# Patient Record
Sex: Female | Born: 1980 | Race: White | Hispanic: No | Marital: Married | Smoking: Current every day smoker
Health system: Southern US, Community
[De-identification: ages and names within clinical notes are randomized; demographics above are authoritative.]

## PROBLEM LIST (undated history)

## (undated) VITALS — BP 140/101 | HR 99 | Temp 98.3°F | Resp 16 | Ht 64.0 in | Wt 223.0 lb

## (undated) DIAGNOSIS — E7800 Pure hypercholesterolemia, unspecified: Secondary | ICD-10-CM

## (undated) DIAGNOSIS — R112 Nausea with vomiting, unspecified: Secondary | ICD-10-CM

## (undated) DIAGNOSIS — R7303 Prediabetes: Secondary | ICD-10-CM

## (undated) DIAGNOSIS — R519 Headache, unspecified: Secondary | ICD-10-CM

## (undated) DIAGNOSIS — Z9889 Other specified postprocedural states: Secondary | ICD-10-CM

## (undated) DIAGNOSIS — K219 Gastro-esophageal reflux disease without esophagitis: Secondary | ICD-10-CM

## (undated) DIAGNOSIS — T85818A Embolism due to other internal prosthetic devices, implants and grafts, initial encounter: Secondary | ICD-10-CM

## (undated) DIAGNOSIS — Z87442 Personal history of urinary calculi: Secondary | ICD-10-CM

## (undated) DIAGNOSIS — Z95828 Presence of other vascular implants and grafts: Secondary | ICD-10-CM

## (undated) DIAGNOSIS — I10 Essential (primary) hypertension: Secondary | ICD-10-CM

## (undated) DIAGNOSIS — Z5189 Encounter for other specified aftercare: Secondary | ICD-10-CM

## (undated) DIAGNOSIS — F32A Depression, unspecified: Secondary | ICD-10-CM

## (undated) DIAGNOSIS — K911 Postgastric surgery syndromes: Secondary | ICD-10-CM

## (undated) DIAGNOSIS — F909 Attention-deficit hyperactivity disorder, unspecified type: Secondary | ICD-10-CM

## (undated) DIAGNOSIS — T7422XA Child sexual abuse, confirmed, initial encounter: Secondary | ICD-10-CM

## (undated) DIAGNOSIS — F419 Anxiety disorder, unspecified: Secondary | ICD-10-CM

## (undated) DIAGNOSIS — R319 Hematuria, unspecified: Secondary | ICD-10-CM

## (undated) DIAGNOSIS — D509 Iron deficiency anemia, unspecified: Secondary | ICD-10-CM

## (undated) DIAGNOSIS — K289 Gastrojejunal ulcer, unspecified as acute or chronic, without hemorrhage or perforation: Secondary | ICD-10-CM

## (undated) DIAGNOSIS — E282 Polycystic ovarian syndrome: Secondary | ICD-10-CM

## (undated) DIAGNOSIS — N289 Disorder of kidney and ureter, unspecified: Secondary | ICD-10-CM

## (undated) DIAGNOSIS — I82409 Acute embolism and thrombosis of unspecified deep veins of unspecified lower extremity: Secondary | ICD-10-CM

## (undated) DIAGNOSIS — T8859XA Other complications of anesthesia, initial encounter: Secondary | ICD-10-CM

## (undated) DIAGNOSIS — K760 Fatty (change of) liver, not elsewhere classified: Secondary | ICD-10-CM

## (undated) DIAGNOSIS — F329 Major depressive disorder, single episode, unspecified: Secondary | ICD-10-CM

## (undated) HISTORY — PX: KNEE SURGERY: SHX244

## (undated) HISTORY — PX: ADENOIDECTOMY: SUR15

## (undated) HISTORY — PX: CHOLECYSTECTOMY: SHX55

## (undated) HISTORY — PX: TONSILLECTOMY: SUR1361

## (undated) HISTORY — DX: Essential (primary) hypertension: I10

## (undated) HISTORY — DX: Child sexual abuse, confirmed, initial encounter: T74.22XA

## (undated) HISTORY — DX: Anxiety disorder, unspecified: F41.9

## (undated) HISTORY — DX: Polycystic ovarian syndrome: E28.2

## (undated) HISTORY — DX: Major depressive disorder, single episode, unspecified: F32.9

## (undated) HISTORY — DX: Gastro-esophageal reflux disease without esophagitis: K21.9

## (undated) HISTORY — DX: Fatty (change of) liver, not elsewhere classified: K76.0

## (undated) HISTORY — DX: Presence of other vascular implants and grafts: Z95.828

## (undated) HISTORY — DX: Other specified postprocedural states: Z98.890

## (undated) HISTORY — DX: Depression, unspecified: F32.A

## (undated) HISTORY — DX: Gastrojejunal ulcer, unspecified as acute or chronic, without hemorrhage or perforation: K28.9

## (undated) HISTORY — PX: ABDOMINAL SURGERY: SHX537

## (undated) HISTORY — DX: Pure hypercholesterolemia, unspecified: E78.00

## (undated) HISTORY — PX: TONSILECTOMY, ADENOIDECTOMY, BILATERAL MYRINGOTOMY AND TUBES: SHX2538

## (undated) HISTORY — PX: OTHER SURGICAL HISTORY: SHX169

## (undated) HISTORY — PX: ESOPHAGEAL DILATION: SHX303

---

## 2001-10-15 ENCOUNTER — Ambulatory Visit (HOSPITAL_COMMUNITY): Admission: RE | Admit: 2001-10-15 | Discharge: 2001-10-15 | Payer: Self-pay

## 2001-10-28 ENCOUNTER — Encounter: Payer: Self-pay | Admitting: Family Medicine

## 2001-10-28 ENCOUNTER — Ambulatory Visit (HOSPITAL_COMMUNITY): Admission: RE | Admit: 2001-10-28 | Discharge: 2001-10-28 | Payer: Self-pay | Admitting: Family Medicine

## 2001-11-18 ENCOUNTER — Encounter: Payer: Self-pay | Admitting: General Surgery

## 2001-11-18 ENCOUNTER — Observation Stay (HOSPITAL_COMMUNITY): Admission: RE | Admit: 2001-11-18 | Discharge: 2001-11-19 | Payer: Self-pay | Admitting: General Surgery

## 2001-11-18 ENCOUNTER — Encounter (INDEPENDENT_AMBULATORY_CARE_PROVIDER_SITE_OTHER): Payer: Self-pay

## 2004-04-04 ENCOUNTER — Encounter: Admission: RE | Admit: 2004-04-04 | Discharge: 2004-04-04 | Payer: Self-pay | Admitting: Orthopedic Surgery

## 2004-06-04 HISTORY — PX: GASTRIC BYPASS: SHX52

## 2004-07-11 ENCOUNTER — Ambulatory Visit: Payer: Self-pay | Admitting: Cardiology

## 2007-08-13 ENCOUNTER — Inpatient Hospital Stay (HOSPITAL_COMMUNITY): Admission: AD | Admit: 2007-08-13 | Discharge: 2007-08-16 | Payer: Self-pay | Admitting: Family Medicine

## 2007-08-15 ENCOUNTER — Ambulatory Visit: Payer: Self-pay | Admitting: Internal Medicine

## 2007-08-18 ENCOUNTER — Ambulatory Visit (HOSPITAL_COMMUNITY): Admission: RE | Admit: 2007-08-18 | Discharge: 2007-08-18 | Payer: Self-pay | Admitting: Family Medicine

## 2008-01-09 ENCOUNTER — Observation Stay (HOSPITAL_COMMUNITY): Admission: AD | Admit: 2008-01-09 | Discharge: 2008-01-12 | Payer: Self-pay | Admitting: Family Medicine

## 2008-03-24 ENCOUNTER — Ambulatory Visit: Payer: Self-pay | Admitting: *Deleted

## 2008-03-24 ENCOUNTER — Inpatient Hospital Stay (HOSPITAL_COMMUNITY): Admission: RE | Admit: 2008-03-24 | Discharge: 2008-03-26 | Payer: Self-pay | Admitting: *Deleted

## 2008-03-24 ENCOUNTER — Emergency Department (HOSPITAL_COMMUNITY): Admission: EM | Admit: 2008-03-24 | Discharge: 2008-03-24 | Payer: Self-pay | Admitting: Emergency Medicine

## 2008-04-08 ENCOUNTER — Ambulatory Visit (HOSPITAL_COMMUNITY): Payer: Self-pay | Admitting: Psychiatry

## 2008-04-12 ENCOUNTER — Ambulatory Visit (HOSPITAL_COMMUNITY): Payer: Self-pay | Admitting: Psychiatry

## 2008-04-19 ENCOUNTER — Ambulatory Visit (HOSPITAL_COMMUNITY): Payer: Self-pay | Admitting: Psychiatry

## 2008-04-28 ENCOUNTER — Ambulatory Visit (HOSPITAL_COMMUNITY): Payer: Self-pay | Admitting: Psychiatry

## 2008-05-12 ENCOUNTER — Ambulatory Visit (HOSPITAL_COMMUNITY): Payer: Self-pay | Admitting: Psychiatry

## 2008-05-31 ENCOUNTER — Ambulatory Visit (HOSPITAL_COMMUNITY): Payer: Self-pay | Admitting: Psychiatry

## 2008-06-14 ENCOUNTER — Ambulatory Visit (HOSPITAL_COMMUNITY): Payer: Self-pay | Admitting: Psychiatry

## 2008-07-01 ENCOUNTER — Ambulatory Visit (HOSPITAL_COMMUNITY): Payer: Self-pay | Admitting: Psychiatry

## 2008-07-15 ENCOUNTER — Ambulatory Visit (HOSPITAL_COMMUNITY): Payer: Self-pay | Admitting: Psychiatry

## 2008-08-04 ENCOUNTER — Ambulatory Visit (HOSPITAL_COMMUNITY): Payer: Self-pay | Admitting: Psychiatry

## 2008-09-09 ENCOUNTER — Ambulatory Visit (HOSPITAL_COMMUNITY): Payer: Self-pay | Admitting: Psychiatry

## 2008-12-02 ENCOUNTER — Ambulatory Visit (HOSPITAL_COMMUNITY): Payer: Self-pay | Admitting: Psychiatry

## 2009-02-08 ENCOUNTER — Ambulatory Visit (HOSPITAL_COMMUNITY): Payer: Self-pay | Admitting: Psychiatry

## 2009-03-09 ENCOUNTER — Observation Stay (HOSPITAL_COMMUNITY): Admission: EM | Admit: 2009-03-09 | Discharge: 2009-03-10 | Payer: Self-pay | Admitting: Emergency Medicine

## 2009-04-05 ENCOUNTER — Emergency Department (HOSPITAL_COMMUNITY): Admission: EM | Admit: 2009-04-05 | Discharge: 2009-04-05 | Payer: Self-pay | Admitting: Emergency Medicine

## 2009-05-10 ENCOUNTER — Ambulatory Visit (HOSPITAL_COMMUNITY): Payer: Self-pay | Admitting: Psychiatry

## 2009-08-04 ENCOUNTER — Ambulatory Visit (HOSPITAL_COMMUNITY): Payer: Self-pay | Admitting: Psychiatry

## 2009-09-13 ENCOUNTER — Ambulatory Visit (HOSPITAL_COMMUNITY): Payer: Self-pay | Admitting: Psychiatry

## 2009-10-06 ENCOUNTER — Ambulatory Visit (HOSPITAL_COMMUNITY): Payer: Self-pay | Admitting: Psychiatry

## 2009-12-27 ENCOUNTER — Ambulatory Visit (HOSPITAL_COMMUNITY): Payer: Self-pay | Admitting: Psychiatry

## 2010-03-23 ENCOUNTER — Ambulatory Visit (HOSPITAL_COMMUNITY): Payer: Self-pay | Admitting: Psychiatry

## 2010-06-22 ENCOUNTER — Ambulatory Visit (HOSPITAL_COMMUNITY)
Admission: RE | Admit: 2010-06-22 | Discharge: 2010-06-22 | Payer: Self-pay | Source: Home / Self Care | Attending: Psychiatry | Admitting: Psychiatry

## 2010-07-24 ENCOUNTER — Ambulatory Visit (INDEPENDENT_AMBULATORY_CARE_PROVIDER_SITE_OTHER): Payer: Self-pay | Admitting: Gastroenterology

## 2010-07-24 ENCOUNTER — Encounter: Payer: Self-pay | Admitting: Gastroenterology

## 2010-07-24 ENCOUNTER — Other Ambulatory Visit: Payer: Self-pay | Admitting: Internal Medicine

## 2010-07-24 ENCOUNTER — Encounter: Payer: Self-pay | Admitting: Internal Medicine

## 2010-07-24 DIAGNOSIS — K458 Other specified abdominal hernia without obstruction or gangrene: Secondary | ICD-10-CM

## 2010-07-24 DIAGNOSIS — R1033 Periumbilical pain: Secondary | ICD-10-CM

## 2010-07-24 DIAGNOSIS — R197 Diarrhea, unspecified: Secondary | ICD-10-CM

## 2010-07-24 DIAGNOSIS — K912 Postsurgical malabsorption, not elsewhere classified: Secondary | ICD-10-CM | POA: Insufficient documentation

## 2010-07-25 ENCOUNTER — Ambulatory Visit (HOSPITAL_COMMUNITY)
Admission: RE | Admit: 2010-07-25 | Discharge: 2010-07-25 | Disposition: A | Discharge status: Home / Self Care | Payer: BC Managed Care – PPO | Source: Ambulatory Visit | Attending: Internal Medicine | Admitting: Internal Medicine

## 2010-07-25 ENCOUNTER — Other Ambulatory Visit: Payer: Self-pay | Admitting: Internal Medicine

## 2010-07-25 ENCOUNTER — Encounter (HOSPITAL_COMMUNITY): Payer: Self-pay

## 2010-07-25 ENCOUNTER — Encounter: Payer: Self-pay | Admitting: Gastroenterology

## 2010-07-25 DIAGNOSIS — K458 Other specified abdominal hernia without obstruction or gangrene: Secondary | ICD-10-CM

## 2010-07-25 DIAGNOSIS — R109 Unspecified abdominal pain: Secondary | ICD-10-CM | POA: Insufficient documentation

## 2010-07-25 MED ORDER — IOHEXOL 300 MG/ML  SOLN: 100.0000 mL | Freq: Once | INTRAMUSCULAR | Status: AC | PRN

## 2010-07-27 ENCOUNTER — Encounter: Payer: Self-pay | Admitting: Internal Medicine

## 2010-07-31 ENCOUNTER — Telehealth (INDEPENDENT_AMBULATORY_CARE_PROVIDER_SITE_OTHER): Payer: Self-pay

## 2010-08-01 ENCOUNTER — Encounter (HOSPITAL_COMMUNITY): Payer: BC Managed Care – PPO

## 2010-08-01 ENCOUNTER — Other Ambulatory Visit: Payer: Self-pay | Admitting: Internal Medicine

## 2010-08-01 DIAGNOSIS — Z01812 Encounter for preprocedural laboratory examination: Secondary | ICD-10-CM | POA: Insufficient documentation

## 2010-08-01 LAB — BASIC METABOLIC PANEL
BUN: 9 mg/dL (ref 6–23)
CO2: 23 mEq/L (ref 19–32)
Chloride: 106 mEq/L (ref 96–112)
Glucose, Bld: 155 mg/dL — ABNORMAL HIGH (ref 70–99)
Potassium: 4.2 mEq/L (ref 3.5–5.1)
Sodium: 137 mEq/L (ref 135–145)

## 2010-08-01 LAB — HEMOGLOBIN AND HEMATOCRIT, BLOOD
HCT: 41.1 % (ref 36.0–46.0)
Hemoglobin: 13.1 g/dL (ref 12.0–15.0)

## 2010-08-01 LAB — HCG, QUANTITATIVE, PREGNANCY: hCG, Beta Chain, Quant, S: <2 m[IU]/mL (ref <5)

## 2010-08-01 NOTE — Letter (Signed)
Summary: TCS ORDER  TCS ORDER   Imported By: Rexene Alberts 07/27/2010 11:13:53  _____________________________________________________________________  External Attachment:    Type:   Image     Comment:   External Document

## 2010-08-01 NOTE — Letter (Signed)
Summary: TCS ORDER  TCS ORDER   Imported By: Rexene Alberts 07/24/2010 15:24:16  _____________________________________________________________________  External Attachment:    Type:   Image     Comment:   External Document

## 2010-08-01 NOTE — Letter (Signed)
Summary: RAD ORDER CT ABD/PEL  RAD ORDER CT ABD/PEL   Imported By: Rexene Alberts 07/24/2010 15:13:40  _____________________________________________________________________  External Attachment:    Type:   Image     Comment:   External Document

## 2010-08-01 NOTE — Assessment & Plan Note (Signed)
Summary: chronic dumping syndrome   Vital Signs:  Patient profile:   30 year old female Height:      64 inches Weight:      237 pounds BMI:     40.83 Temp:     98.3 degrees F oral Pulse rate:   104 / minute BP sitting:   120 / 82  (left arm)  Visit Type:  Initial Consult [facility]   History of Present Illness: [name] is a pleasant 29 year old Caucasian female who presents today with her husband, at the request of the [facility] secondary to chronic diarrhea. [name] underwent a lap gastric bypass in 2006 by Dr. Lily Peer at [facility]. Her pre-op wt was 246, and she lost a total of 90 lbs; however, she has unfortunately almost completely regained all of her weight. She reports chronic issues since the operation, to include multiple EGDs with dilatations secondary to anastomotic ulcers, as well as chronic abdominal pain, N/V, requiring multiple admissions inpatient. She denies any nausea/vomiting, dysphagia, or epigastric pain currently. She is taking the appropriate vitamins s/p bariatric surgery, with the exception of Vit B12. She was told she did not need this anymore. Most current labs available are listed below.  Her main concern is of chronic diarrhea up to 17X per day at its worst. She has noted paper hematochezia X 1 month, not large amount. +postprandial urgency. She reports constant gas and bloating, as well as periumbilical pain, "crampy" 5/10. She states this lower abdominal pain has been "for years", and it is worse with eating. She was told she had chronic dumping by Dr. Adriana Simas at [facility]. She has undergone multiple tests to include GES, multiple EGDs, Barium swallow. No CT.  She reports snacking all day long, with an affinity for sweets. She enjoys dove chocolates, occasional cupcakes and cookies. Reports waffle with  nutella for breakfast, hot pocket for lunch, baked meat, veggies, and starch for dinner. Occasional  fast food. Avoids fried foods. +carbonated beverages, usually 1 liter diet dr pepper daily.   Jan 2012: mildly elevated ALT at 39, A1c 5.8, albumin low normal Nov 2011: H/H 13/40, TSH nl, B12 high 1798 May 2011: Vit D 51, nl    Current Medications (verified): 1)  Multivitamin .... Qd 2)  Amoxicilin Po .... Three Times A Day 3)  Calcium + D .... Once Daily 4)  Nexium .... 40mg  Qd 5)  Symbyax .... 6/25 Mg Qd 6)  Iron Supplement .... Qd 7)  Trazadone .... 100mg  Once Daily Prn 8)  Phenergan .... 25mg  Qid As Needed 9)  Clonapine .... 0.5mg  Qd  Allergies (verified): 1)  ! Codeine 2)  ! Keflex  Past History:  Past Medical History: DM (prior to surgery) HTN Fatty liver GERD, resolved hypercholesterolemia Multiple EGDs (usually emergent secondary to anastomotic strictures), last done Oct 2010 hx anastomotic ulcers   Past Surgical History: Gastric bypass 2006: Dr. Lily Peer knee surgery X 3 tonsillectomy as child left breast surgery (benign) cholecystectomy  Family History: Mother:gastric bypass, DM, HTN, hypercholesterolemia Father:DM, HTN No FH of Colon Cancer:  Social History: Occupation: not currently employed No children Married X 4 years Patient has never smoked.  Alcohol Use - yes, rarely  Illicit Drug Use - no Smoking Status:  never Drug Use:  no  Review of Systems General:  Denies fever, chills, and anorexia. Eyes:  Denies blurring, irritation, and discharge. ENT:  Denies sore throat, hoarseness, and difficulty swallowing.  CV:  Denies chest pains and syncope. Resp:  Denies dyspnea at rest and wheezing. GI:  See HPI. GU:  Denies urinary burning and urinary frequency. MS:  Denies joint pain / LOM, joint swelling, and joint stiffness. Derm:  Denies rash, itching, and dry skin. Neuro:  Denies weakness and syncope. Psych:  Denies depression and anxiety. Endo:  Denies cold intolerance and heat intolerance. Heme:  Denies bruising and  bleeding.  Physical Exam  General:  healthy appearing and obese.   Head:  Normocephalic and atraumatic. Eyes:  PERRLA, no icterus. Lungs:  Clear throughout to auscultation. Heart:  Regular rate and rhythm; no murmurs, rubs,  or bruits. Abdomen:  +BS, obese, soft, mildly TTP periumbilically. No palpably masses or HSM. no rebound or guarding.  Msk:  Symmetrical with no gross deformities. Normal posture. Pulses:  Normal pulses noted. Extremities:  No clubbing, cyanosis, edema or deformities noted. Neurologic:  Alert and  oriented x4;  grossly normal neurologically. Skin:  Intact without significant lesions or rashes. Psych:  Alert and cooperative. Normal mood and affect.   Impression & Recommendations:  Problem # 1:  DIARRHEA (ICD-44.84)  30 year old Caucasian female with hx of lap gastric bypass in 2006 by Dr. Lily Peer. Reports chronic diarrhea for several months, has recently noticed +small amount of paper hematochezia intermitently. c/o +postprandial urgency. Reports at most 17 loose stools per day. Pt admits to enjoying sweets such as chocolate, snacking throughout day, drinking carbonated beverages. This most likely is dumping syndrome, with her noncompliance to the dietary regimen exacerbating the occurrence and frequency of stools. ? bile salt component,   s/p chole. ?IBS. Small amount of hematochezia may be r/t benign anorectal source, yet could benefit from colonoscopy as pt has not yet undergone one.  I had a lengthy discussion with pt concerning her diet; I feel a large part of her symptoms are r/t non-compliance with dietary regimen. However, we will proceed with a colonoscopy first to assess for any other etiology.  TCS with Dr. Jena Gauss in OR secondary to difficulty with prior sedation, polypharmacy: the R/B/A have been discussed in detail with pt; she states understanding and desires to proceed. Avoid sweets, breads, carbonated drinks Add probiotic daily, immodium as needed  (align samples given) Consider referral to nutritionist: this would be highly beneficial in this setting  Orders: Consultation Level III (16109)  Problem # 2:  ABDOMINAL PAIN-PERIUMBILICAL (ICD-789.05)  peri-umbilical pain that is chronic in nature. May be functional abdominal pain, but symptoms concerning for possible internal hernia due to location and symptomatology.  Will proceed with CT abd/pelvis Further rec's to follow  Orders: Consultation Level III (60454)  Problem # 3:  INTESTINAL MALABSORPTION, POSTSURGICAL (ICD-579.3) Hx of gastric bypass, taking post-operative supplements as required yet no B12. Needs updated nutritional parameters.   CBC, B12, Vit D  Obtain Op reports from 2006 Obtain any radiology reports (i.e. barium swallow, GES) Continue vitamins, resume B12  Orders: T-CBC w/Diff (09811-91478) T-Vitamin B12 [phone]) T-Vitamin D (25-Hydroxy) (57846-96295) Consultation Level III (28413)   Orders Added: 1)  T-CBC w/Diff [24401-02725] 2)  T-Vitamin B12 [82607-23330] 3)  T-Vitamin D (25-Hydroxy) [36644-03474] 4)  Consultation Level III [25956]

## 2010-08-03 ENCOUNTER — Encounter: Payer: Self-pay | Admitting: Internal Medicine

## 2010-08-03 ENCOUNTER — Other Ambulatory Visit: Payer: Self-pay | Admitting: Internal Medicine

## 2010-08-03 ENCOUNTER — Ambulatory Visit (HOSPITAL_COMMUNITY)
Admission: RE | Admit: 2010-08-03 | Discharge: 2010-08-03 | Disposition: A | Discharge status: Home / Self Care | Payer: BC Managed Care – PPO | Source: Ambulatory Visit | Attending: Internal Medicine | Admitting: Internal Medicine

## 2010-08-03 DIAGNOSIS — R197 Diarrhea, unspecified: Secondary | ICD-10-CM | POA: Insufficient documentation

## 2010-08-03 DIAGNOSIS — Z9889 Other specified postprocedural states: Secondary | ICD-10-CM

## 2010-08-03 DIAGNOSIS — Z01812 Encounter for preprocedural laboratory examination: Secondary | ICD-10-CM | POA: Insufficient documentation

## 2010-08-03 HISTORY — DX: Other specified postprocedural states: Z98.890

## 2010-08-04 LAB — OVA AND PARASITE EXAMINATION

## 2010-08-04 LAB — FECAL LACTOFERRIN, QUANT: Fecal Lactoferrin: POSITIVE

## 2010-08-07 LAB — STOOL CULTURE

## 2010-08-09 NOTE — Op Note (Addendum)
NAMESTEFANNIE, [name]             ACCOUNT NO.:  [id number]  MEDICAL RECORD NO.:  [id number]           PATIENT TYPE:  O  LOCATION:  [facility]                          FACILITY:  [facility]  PHYSICIAN:  R. Roetta Sessions, M.D. DATE OF BIRTH:  05-Jul-1980  DATE OF PROCEDURE:  08/03/2010 DATE OF DISCHARGE:                              OPERATIVE REPORT   INDICATIONS FOR PROCEDURE:  A 30 year old lady with chronic diarrhea. Recent CT demonstrated only a fatty liver.  Ileal colonoscopy is now being done to further evaluate chronic diarrhea.  Risks, benefits, limitations, alternatives, and imponderables have been discussed, questions answered.  Please see the documentation in the medical record.  PROCEDURE NOTE:  O2 saturation, blood pressure, pulse, and respirations were monitored throughout the entire procedure.  CONSCIOUS SEDATION:  Because of polypharmacy difficulties with sedation previously, this is being done under propofol.  The patient was placed in left lateral decubitus position.  INSTRUMENT:  Pentax video chip system.  FINDINGS:  Digital rectal exam revealed no abnormalities.  Endoscopic findings:  Prep was adequate.  Colon:  Colonic mucosa was surveyed from the rectosigmoid junction through the left transverse right colon to the appendiceal orifice, ileocecal valve/cecum.  These structures were well seen and photographed for the record.  From this level, the scope was slowly withdraw.  All previously mentioned mucosal surfaces were again seen.  The terminal ileum was intubated to 5 cm.  The patient's colon appeared entirely normal as did terminal ileum.  Biopsies of the ascending and sigmoid segments were taken to rule out microscopic colitis.  Also, fresh stool specimen was submitted to the lab.  Scope was pulled down into the rectum where thorough examination of the rectal mucosa including retroflexion of the anal verge demonstrated no abnormalities. The patient tolerated the  procedure well.  Cecal withdrawal time 11 minutes.  IMPRESSION:  Normal rectum, colon, and terminal ileum, status post segmental biopsy, stool sampling.  RECOMMENDATIONS:  We will follow up on pending studies.  We will then make further suggestions as to management.     Jonathon Bellows, M.D.     RMR/MEDQ  D:  08/03/2010  T:  08/03/2010  Job:  [id number]  cc:   [facility]  Electronically Signed by Lorrin Goodell M.D. on 08/08/2010 02:43:51 PM Electronically Signed by Lorrin Goodell M.D. on 08/08/2010 03:17:09 PM Electronically Signed by Lorrin Goodell M.D. on 08/08/2010 03:41:54 PM Electronically Signed by Lorrin Goodell M.D. on 08/08/2010 04:18:30 PM Electronically Signed by Lorrin Goodell M.D. on 08/08/2010 04:49:40 PM Electronically Signed by Lorrin Goodell M.D. on 08/08/2010 04:49:40 PM Electronically Signed by Lorrin Goodell M.D. on 08/08/2010 07:36:58 PM

## 2010-08-10 NOTE — Progress Notes (Signed)
----   Converted from flag ---- ---- 07/31/2010 9:05 AM, Gerrit Halls NP wrote: you can inform her the CT did not show evidence of internal hernia. No abnormalitites. We will continue with colonoscopy.   ---- 07/28/2010 3:04 PM, Hendricks Limes LPN wrote: pt called requesting results ------------------------------  Appended Document:  pt aware

## 2010-08-14 ENCOUNTER — Encounter (INDEPENDENT_AMBULATORY_CARE_PROVIDER_SITE_OTHER): Payer: Self-pay

## 2010-08-17 ENCOUNTER — Encounter (INDEPENDENT_AMBULATORY_CARE_PROVIDER_SITE_OTHER): Payer: Self-pay | Admitting: Psychiatry

## 2010-08-17 DIAGNOSIS — F331 Major depressive disorder, recurrent, moderate: Secondary | ICD-10-CM

## 2010-08-22 NOTE — Miscellaneous (Addendum)
Summary: stool studies  Clinical Lists Changes Culture, Stool(Salm/Shig/Campy&ECO157) - STATUS: Final  SEE NOTE.                                 Perform Date: 1 Mar12 10:35  Ordered By: Jena Gauss MD , Gerrit Friends           Ordered Date: 1 Mar12 10:35  Facility: [facility]                               Department: [facility]  Service Report Text     SPECIMEN OBTAINED:            08/03/2010 10:35  SPECIMEN DESCRIPTION:         STOOL                                ENDO SPECIMEN  SPECIAL REQUESTS:             NONE  CULTURE:                      NO SALMONELLA, SHIGELLA, CAMPYLOBACTER, OR                                YERSINIA ISOLATED                                Performed at [facility]  REPORT STATUS:                FINAL                                08/07/2010  Additional Information  HL7 RESULT STATUS : F  External IF Update Timestamp : 2010-08-07:08:12:00.000000   Ova and Parasite Exam - STATUS: Final  SEE NOTE.                                 Perform Date: 1 Mar12 10:35  Ordered By: Jena Gauss MD , Gerrit Friends           Ordered Date: 1 Mar12 10:35  Facility: [facility]                               Department: [facility]  Service Report Text     SPECIMEN OBTAINED:            08/03/2010 10:35  SPECIMEN DESCRIPTION:         STOOL                                ENDO SPECIMEM  SPECIAL REQUESTS:             NONE  OVA AND PARASITES:            NO OVA OR PARASITES SEEN  REPORT STATUS:                FINAL  08/04/2010  Additional Information  HL7 RESULT STATUS : F  External IF Update Timestamp : 2010-08-04:16:19:00.000000    Fecal-Lactoferrin - STATUS: Final  SEE NOTE.                                 Perform Date: 1 Mar12 10:35  Ordered By: Jena Gauss MD , Gerrit Friends           Ordered Date: 1 Mar12 10:35  Facility: [facility]                               Department: [facility]  Service Report Text     SPECIMEN OBTAINED:            08/03/2010 10:35  SPECIMEN DESCRIPTION:          STOOL                                ENDO SPECIMEN  SPECIAL REQUESTS:             NONE  FECAL LACTOFERRIN:            POSITIVE  REPORT STATUS:                FINAL                                08/04/2010  Additional Information  HL7 RESULT STATUS : F  External IF Update Timestamp : 2010-08-04:05:25:00.000000   L-Clostridium Difficile by PCR - STATUS: Final                                            Perform Date: 1 Mar12 10:35  Ordered ByJena Gauss MD , Gerrit Friends           Ordered Date: 1 Mar12 10:35                                       Last Updated Date: 1 Mar12 12:28  Facility: [facility]                               Department: [facility]  Accession #: [id number]                   USN:       [id number]  Findings  Result Name                              Result     Abnl   Normal Range     Units      Perf. Loc.  C Difficile by PCR                       NEGATIVE          NEG  Additional Information  HL7 RESULT STATUS : F  External IF Update Timestamp : 2010-08-03:12:26:00.000000  Appended Document: stool studies bx's negative for colitis; stool negative except  for lactoferrin. Recommend add Align to her regimen; OV w AS in next 3- 4 weeks  Appended Document: stool studies pt aware  Appended Document: stool studies pt aware of OV for 4/2 @ 3pm w/AS

## 2010-09-04 ENCOUNTER — Ambulatory Visit: Payer: Self-pay | Admitting: Gastroenterology

## 2010-09-06 LAB — PREGNANCY, URINE: Preg Test, Ur: NEGATIVE

## 2010-09-06 LAB — COMPREHENSIVE METABOLIC PANEL
BUN: 3 mg/dL — ABNORMAL LOW (ref 6–23)
Calcium: 9.5 mg/dL (ref 8.4–10.5)
GFR calc non Af Amer: >60 mL/min (ref >60)
Glucose, Bld: 124 mg/dL — ABNORMAL HIGH (ref 70–99)
Total Bilirubin: 0.5 mg/dL (ref 0.3–1.2)
Total Protein: 7.2 g/dL (ref 6.0–8.3)

## 2010-09-06 LAB — CBC
HCT: 44.1 % (ref 36.0–46.0)
Hemoglobin: 14.5 g/dL (ref 12.0–15.0)
MCHC: 33 g/dL (ref 30.0–36.0)
MCV: 84.9 fL (ref 78.0–100.0)
RDW: 16.2 % — ABNORMAL HIGH (ref 11.5–15.5)

## 2010-09-06 LAB — URINALYSIS, ROUTINE W REFLEX MICROSCOPIC
Glucose, UA: NEGATIVE mg/dL
Leukocytes, UA: NEGATIVE
Protein, ur: NEGATIVE mg/dL
Specific Gravity, Urine: <1.005 — ABNORMAL LOW (ref 1.005–1.030)
Urobilinogen, UA: 0.2 mg/dL (ref 0.0–1.0)
pH: 5.5 (ref 5.0–8.0)

## 2010-09-06 LAB — DIFFERENTIAL
Basophils Relative: 0 % (ref 0–1)
Eosinophils Relative: 0 % (ref 0–5)
Lymphs Abs: 1.7 10*3/uL (ref 0.7–4.0)
Monocytes Relative: 5 % (ref 3–12)
Neutro Abs: 5.7 10*3/uL (ref 1.7–7.7)
Neutrophils Relative %: 73 % (ref 43–77)

## 2010-09-06 LAB — PREALBUMIN: Prealbumin: 25 mg/dL (ref 18.0–45.0)

## 2010-09-06 LAB — URINE MICROSCOPIC-ADD ON

## 2010-09-06 LAB — LIPASE, BLOOD: Lipase: 31 U/L (ref 11–59)

## 2010-09-07 LAB — DIFFERENTIAL
Basophils Absolute: 0 10*3/uL (ref 0.0–0.1)
Basophils Relative: 0 % (ref 0–1)
Basophils Relative: 0 % (ref 0–1)
Eosinophils Absolute: 0 10*3/uL (ref 0.0–0.7)
Eosinophils Absolute: 0 10*3/uL (ref 0.0–0.7)
Eosinophils Relative: 0 % (ref 0–5)
Eosinophils Relative: 0 % (ref 0–5)
Lymphocytes Relative: 14 % (ref 12–46)
Lymphocytes Relative: 32 % (ref 12–46)
Lymphs Abs: 3.2 10*3/uL (ref 0.7–4.0)
Monocytes Relative: 5 % (ref 3–12)

## 2010-09-07 LAB — COMPREHENSIVE METABOLIC PANEL
ALT: 14 U/L (ref 0–35)
ALT: 19 U/L (ref 0–35)
AST: 19 U/L (ref 0–37)
AST: 24 U/L (ref 0–37)
Albumin: 3.5 g/dL (ref 3.5–5.2)
Alkaline Phosphatase: 92 U/L (ref 39–117)
BUN: 6 mg/dL (ref 6–23)
CO2: 24 mEq/L (ref 19–32)
CO2: 24 mEq/L (ref 19–32)
Calcium: 8.7 mg/dL (ref 8.4–10.5)
Chloride: 101 mEq/L (ref 96–112)
Creatinine, Ser: 0.75 mg/dL (ref 0.4–1.2)
GFR calc Af Amer: >60 mL/min (ref >60)
GFR calc Af Amer: >60 mL/min (ref >60)
GFR calc non Af Amer: >60 mL/min (ref >60)
GFR calc non Af Amer: >60 mL/min (ref >60)
Potassium: 2.8 mEq/L — ABNORMAL LOW (ref 3.5–5.1)
Sodium: 137 mEq/L (ref 135–145)
Sodium: 138 mEq/L (ref 135–145)
Total Bilirubin: 0.6 mg/dL (ref 0.3–1.2)
Total Protein: 6.1 g/dL (ref 6.0–8.3)
Total Protein: 8.2 g/dL (ref 6.0–8.3)

## 2010-09-07 LAB — CBC
HCT: 44.8 % (ref 36.0–46.0)
Hemoglobin: 12.9 g/dL (ref 12.0–15.0)
MCHC: 33.4 g/dL (ref 30.0–36.0)
MCV: 83.8 fL (ref 78.0–100.0)
Platelets: 230 10*3/uL (ref 150–400)
Platelets: 255 10*3/uL (ref 150–400)
RBC: 4.56 MIL/uL (ref 3.87–5.11)
RBC: 5.34 MIL/uL — ABNORMAL HIGH (ref 3.87–5.11)
RDW: 15.2 % (ref 11.5–15.5)
RDW: 15.3 % (ref 11.5–15.5)
WBC: 14.6 10*3/uL — ABNORMAL HIGH (ref 4.0–10.5)

## 2010-09-07 LAB — URINALYSIS, ROUTINE W REFLEX MICROSCOPIC
Hgb urine dipstick: NEGATIVE
Ketones, ur: 15 mg/dL — AB
Nitrite: NEGATIVE
Protein, ur: NEGATIVE mg/dL
Specific Gravity, Urine: >1.03 — ABNORMAL HIGH (ref 1.005–1.030)
Urobilinogen, UA: 0.2 mg/dL (ref 0.0–1.0)
pH: 5.5 (ref 5.0–8.0)

## 2010-09-07 LAB — LIPASE, BLOOD: Lipase: 19 U/L (ref 11–59)

## 2010-09-25 ENCOUNTER — Telehealth: Payer: Self-pay

## 2010-09-25 NOTE — Telephone Encounter (Signed)
Pt came by office and said her prescription for Protonix has expired that [facility] gave her and she is no longer with [facility] because her husband got a job. She is taking Nexium  40mg  qhs,  Protonix 40mg  qhs, and Ranitidine qhs. She was scheduled for OV on 10/16/2010. Darl Pikes was able to change and give her an appt tomorrow to review meds and prescriptions.

## 2010-09-26 ENCOUNTER — Ambulatory Visit (INDEPENDENT_AMBULATORY_CARE_PROVIDER_SITE_OTHER): Payer: BC Managed Care – PPO | Admitting: Gastroenterology

## 2010-09-26 ENCOUNTER — Encounter: Payer: Self-pay | Admitting: Gastroenterology

## 2010-09-26 VITALS — BP 131/89 | HR 77 | Temp 98.2°F | Ht 64.0 in | Wt 234.4 lb

## 2010-09-26 DIAGNOSIS — R197 Diarrhea, unspecified: Secondary | ICD-10-CM

## 2010-09-26 MED ORDER — DICYCLOMINE HCL 10 MG PO CAPS: 10.0000 mg | ORAL_CAPSULE | Freq: Four times a day (QID) | ORAL | Status: AC

## 2010-09-26 NOTE — Assessment & Plan Note (Signed)
30 year old female with hx of gastric bypass in 2006. Colonoscopy performed March 2012 normal, stool studies all completed and without etiology. Likely dumping syndrome, food choices. At last visit admitted to sweets, high carb diet. Discussed high protein, low carb, high fiber diet. Would highly benefit from nutrition referral for in-depth management. Will trial bentyl. Keep food diary, return in 1 week. F/u in 3 mos.

## 2010-09-26 NOTE — Progress Notes (Signed)
Referring Provider: Harlow Asa, MD Primary Care Physician:  Harlow Asa, MD Gastroenterologist: Dr. Jena Gauss  Chief Complaint  Patient presents with  . Follow-up    about medication and colonscopy    HPI:   [name] returns today in f/u after colonoscopy that was performed secondary to chronic diarrhea. Hx of gastric bypass in 2006. States diarrhea has overall slightly improved. Only complaint is of gas, sometimes doubles her over. Taking a probiotic, not tried gas-x yet. Eats a lot of yogurt and seems to do well with this without diarrhea. Postprandial stools with salad. Better about limiting snacking. Occasional diarrhea with fresh fruit. No melena or brbpr. Down a few lbs from last visit.    Past Medical History  Diagnosis Date  . DM (diabetes mellitus)   . HTN (hypertension)   . Fatty liver   . GERD (gastroesophageal reflux disease)   . Hypercholesterolemia   . Anastomotic ulcer     multiple  . S/P endoscopy     last done Oct 2010, multiple, usually emergent due to anastomotic strictures  . S/P colonoscopy 08/2010    normal.    Past Surgical History  Procedure Date  . Gastric bypass 2006    Dr. Lily Peer  . Knee surgery     x3  . Tonsilectomy, adenoidectomy, bilateral myringotomy and tubes     as a child  . Left breast surgery     benign  . Cholecystectomy     Current Outpatient Prescriptions  Medication Sig Dispense Refill  . BESIVANCE 0.6 % SUSP       . Calcium Carbonate-Vitamin D (CALCIUM + D PO) Take 600 mg by mouth daily.        . clonazePAM (KLONOPIN) 0.5 MG tablet Take 0.5 mg by mouth daily.        Marland Kitchen esomeprazole (NEXIUM) 40 MG packet Take 40 mg by mouth daily before breakfast.        . Ferrous Sulfate (IRON SUPPLEMENT PO) Take by mouth.        . LOTEMAX 0.5 % ophthalmic suspension       . Multiple Vitamin (MULTIVITAMIN PO) Take by mouth daily.        Marland Kitchen olanzapine-FLUoxetine (SYMBYAX) 6-25 MG per capsule Take 1 capsule by mouth every evening.        .  promethazine (PHENERGAN) 25 MG tablet Take 25 mg by mouth every 6 (six) hours as needed.        . traZODone (DESYREL) 100 MG tablet Take 100 mg by mouth daily as needed.          Allergies as of 09/26/2010 - Review Complete 09/26/2010  Allergen Reaction Noted  . Cephalexin  07/24/2010  . Codeine  07/24/2010  . Keflex  07/25/2010  . Skin comfort (alum sulfate-ca acetate)  07/25/2010    Family History  Problem Relation Age of Onset  . Diabetes Mother   . Hypertension Mother   . Hyperlipidemia Mother   . Hypertension Father   . Diabetes Father     History   Social History  . Marital Status: Married    Spouse Name: N/A    Number of Children: N/A  . Years of Education: N/A   Social History Main Topics  . Smoking status: Never Smoker   . Smokeless tobacco: None  . Alcohol Use: No     occassional social drinker  . Drug Use: No     Review of Systems: Gen: Denies fever, chills, anorexia. Denies fatigue,  weakness, weight loss.  CV: Denies chest pain, palpitations, syncope, peripheral edema, and claudication. Resp: Denies dyspnea at rest, cough, wheezing, coughing up blood, and pleurisy. GI: Denies vomiting blood, jaundice, and fecal incontinence.   Denies dysphagia or odynophagia. Derm: Denies rash, itching, dry skin Psych: Denies depression, anxiety, memory loss, confusion. No homicidal or suicidal ideation.  Heme: Denies bruising, bleeding, and enlarged lymph nodes.  Physical Exam: BP 131/89  Pulse 77  Temp(Src) 98.2 F (36.8 C) (Tympanic)  Ht 5\' 4"  (1.626 m)  Wt 234 lb 6.4 oz (106.323 kg)  BMI 40.23 kg/m2 General:   Alert and oriented. No distress noted. Pleasant and cooperative.  Head:  Normocephalic and atraumatic. Eyes:  Conjuctiva clear without scleral icterus. Mouth:  Oral mucosa pink and moist. Good dentition. No lesions. Neck:  Supple, without mass or thyromegaly. Heart:  S1, S2 present without murmurs, rubs, or gallops. Regular rate and rhythm. Abdomen:   +BS, soft, non-tender and non-distended. No rebound or guarding. No HSM or masses noted. Msk:  Symmetrical without gross deformities. Normal posture. Extremities:  Without edema. Neurologic:  Alert and  oriented x4;  grossly normal neurologically. Skin:  Intact without significant lesions or rashes. Psych:  Alert and cooperative. Normal mood and affect.

## 2010-09-26 NOTE — Patient Instructions (Signed)
High fiber diet.   Continue Nexium, but take it in the morning 30 minutes before breakfast daily. Stop Zantac.  Nutrition appointment for assistance with management.  Keep a food diary for 1 week, then return to the office so I can review this. Write down when you have bowel changes or increased gas.  Continue probiotic daily. May take gas-x or beano as needed for gas.  Trial of Bentyl. Contact our office in 2-3 weeks if this does not seem to help.  We will see you back in 3 months.

## 2010-09-26 NOTE — Progress Notes (Signed)
Cc to PCP 

## 2010-10-02 ENCOUNTER — Other Ambulatory Visit: Payer: Self-pay | Admitting: Obstetrics & Gynecology

## 2010-10-02 ENCOUNTER — Other Ambulatory Visit (HOSPITAL_COMMUNITY)
Admission: RE | Admit: 2010-10-02 | Discharge: 2010-10-02 | Disposition: A | Discharge status: Home / Self Care | Payer: BC Managed Care – PPO | Source: Ambulatory Visit | Attending: Obstetrics & Gynecology | Admitting: Obstetrics & Gynecology

## 2010-10-02 DIAGNOSIS — Z01419 Encounter for gynecological examination (general) (routine) without abnormal findings: Secondary | ICD-10-CM | POA: Insufficient documentation

## 2010-10-02 NOTE — Telephone Encounter (Signed)
noted 

## 2010-10-12 ENCOUNTER — Encounter (INDEPENDENT_AMBULATORY_CARE_PROVIDER_SITE_OTHER): Payer: BC Managed Care – PPO | Admitting: Psychiatry

## 2010-10-12 DIAGNOSIS — F39 Unspecified mood [affective] disorder: Secondary | ICD-10-CM

## 2010-10-16 ENCOUNTER — Encounter: Payer: Self-pay | Admitting: Internal Medicine

## 2010-10-16 ENCOUNTER — Ambulatory Visit: Payer: Self-pay | Admitting: Gastroenterology

## 2010-10-17 NOTE — H&P (Signed)
[name], [name]             ACCOUNT NO.:  [id number]   MEDICAL RECORD NO.:  [id number]         PATIENT TYPE:  PINP   LOCATION:  A320                          FACILITY:  [facility]   PHYSICIAN:  Donna Bernard, M.D.DATE OF BIRTH:  1980/12/17   DATE OF ADMISSION:  DATE OF DISCHARGE:  LH                              HISTORY & PHYSICAL   CHIEF COMPLAINT:  Vomiting, dizzy when standing, and cannot keep  anything down.   SUBJECTIVE:  This patient is a 30 year old white female with a history  of asthma, remote diabetes diet controlled only, depression, anxiety,  who presents to the office for the second time in 3 days with acute  concerns.  The patient notes a 5-day duration of nausea and vomiting.  She claims she cannot keep anything down, even frequent sips of clear  liquids.  The patient has had possibly a low-grade fever.  She has had  no diarrhea.  On occasion, she had abdominal discomfort, mildly crampy  in nature.  The patient was seen 3 days prior in the office.  At that  time, she had had severe nausea and vomiting for 2 days and was feeling  weak.  We did orthostatic blood pressures at that time, which were  within normal limits and encouraged her to try to maintain outpatient  therapy.  The patient's Compazine suppositories and a Phenergan  injection added to her usual regimen of Phenergan at home.  The patient  now states over the last 3 days that she has thrown up everything day  and night.  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  She has a history of depression and anxiety.  She has been  on four different antidepressants now and the past couple of years.  This patient admits a bit of new stress with her new classes students,  but she does not feel that her illness is stress-related.  Of note, she  had a similar bout earlier this year, which led to two trips to  emergency room and had a several day hospitalization here.  She left  here in  a couple of days, later went down to the [facility] and was  admitted for a week.  In the end, the final diagnosis was just a  protracted response to vomiting and diarrhea from gastroenteritis.  I  felt, however, the patient's history of prior gastric bypass along with  her depression and anxiety were a significant component at that time.  The patient notes no dysuria, no increased frequency, no headache, no  chest pain, and decent appetite.   She claims compliance with her current medications, these include:  1. Abilify 5 mg q.a.m.  2. Trazodone 100 mg p.o. at bedtime.  3. Klonopin 0.5 mg p.o. at bedtime.  4. Celexa 20 mg p.o. q.a.m.  5. Of note, the patient has been on prior antidepressants including      Effexor, Wellbutrin, and Lexapro.   PRIOR SURGERIES:  1. Remote tonsillectomy.  2. Herniorrhaphy.  3. Remote knee surgery.  4. Remote cholecystectomy.  5. Gastric bypass in 2006,  with subsequent significant weight loss      since then on the order of 75-80 pounds.   ALLERGIES:  No true allergies but sensitive to [facility] including family  history positive for hypertension, and diabetes.   SOCIAL:  The patient is married.  No children.  She is a Engineer, site.  No tobacco or alcohol use.   REVIEW OF SYSTEMS:  Otherwise negative.   PHYSICAL EXAM:  Blood pressures 100/70 with pulse of 90 lying down,  90/70 with a pulse 110 while standing.  The patient became lightheaded  and dizzy and requires assistance to sit back down.  HEENT:  Mucous membranes somewhat dry.  NECK: Supple.  LUNGS:  Clear.  HEART:  Regular rate and rhythm.  ABDOMEN:  Hyperactive bowel sounds.  Diffuse upper abdominal tenderness  but rather mild in nature.  No rebound, no guarding, and no CVA  tenderness.  EXTREMITIES:  Normal.  GU:  No dysuria is noted.   SIGNIFICANT LABS:  CBC; white blood count normal and hemoglobin normal.  MET-7, potassium borderline low at 3.2.   IMPRESSION:  1. Vomiting, volume  contraction, weight loss of 7 pounds in the last 3      days, orthostatic changes, clinically dehydrated, blood work not      that impressive also complicated.  2. History of gastric bypass.  3. Depression anxiety.  4. Asthma.  5. Type 2 diabetes, controlled.   PLAN:  Admit for fluids, IV Zofran further orders as noted in the chart.  Hopefully, this will not require protracted visit.      Donna Bernard, M.D.  Electronically Signed     WSL/MEDQ  D:  01/10/2008  T:  01/10/2008  Job:  [id number]
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
## 2010-10-17 NOTE — Group Therapy Note (Signed)
[name], [name]             ACCOUNT NO.:  [id number]   MEDICAL RECORD NO.:  [id number]          PATIENT TYPE:  OBV   LOCATION:  A320                          FACILITY:  [facility]   PHYSICIAN:  Scott A. Gerda Diss, MD    DATE OF BIRTH:  Oct 08, 1980   DATE OF PROCEDURE:  01/12/2008  DATE OF DISCHARGE:                                 PROGRESS NOTE   Overall, the patient states she feels better, minimal nausea, and no  vomiting at this point. No fevers.  The abdomen is a little bit sore  with no severe pain.  She denies any vomiting, had little bit of  diarrhea yesterday.   PHYSICAL EXAMINATION:  GENERAL:  She seems pleasant.  LUNGS:  Clear.  HEART:  Regular.  ABDOMEN:  Soft.  She has minimal epigastric tenderness.  She states she  was taking liquids well.   PLAN:  Increase her diet with a bland food.  If she tolerates this well,  go to a soft diet at lunch, and if she tolerates this well, then we will  be able to potentially discharge her to home later today.  The patient  overall should do well, and if she does go home today, we will have her  follow up in the office next week.      Scott A. Gerda Diss, MD  Electronically Signed     SAL/MEDQ  D:  01/12/2008  T:  01/12/2008  Job:  [id number]

## 2010-10-17 NOTE — Discharge Summary (Signed)
[name], [name]             ACCOUNT NO.:  [id number]   MEDICAL RECORD NO.:  [id number]          PATIENT TYPE:  INP   LOCATION:  A301                          FACILITY:  [facility]   PHYSICIAN:  Donna Bernard, M.D.DATE OF BIRTH:  01/23/1981   DATE OF ADMISSION:  08/12/2007  DATE OF DISCHARGE:  03/14/2009LH                               DISCHARGE SUMMARY   FINAL DIAGNOSES:  1. Gastroc enteritis.  2. Migraine headache.  3. History of gastric bypass with history of previous obstruction.  4. Anxiety and depression.   FINAL DISPOSITION:  1. Patient discharged to home.  2. Patient to use clear liquid plus low-fat soft diet.  3. Have upper GI with small-bowel follow-through test as scheduled.  4. Maintain all home medications.  5. Add Phenergan 25 q.4-6h. p.r.n. for nausea either oral or per      rectum.   INITIAL HISTORY AND PHYSICAL:  Please see H&P as dictated.   HOSPITAL COURSE:  This patient is a 30 year old white female with prior  history of type 2 diabetes, asthma and remote gastric bypass in 2006  with subsequent obstruction who presented the day of admission with  history of multiple trips to the emergency room in the previous days.  The patient had vomiting, dry heaves, abdominal pain.  CBC showed 12,000  white blood count.  Potassium was low at 2.8.  The patient was having  tremendous amount of vomiting and diarrhea.  This was felt to represent  gastritis, it was also felt to be a failure on outpatient therapy.  The  patient was admitted to the hospital for IV hydration.  She was given  Zofran IV for nausea.  We did give Rocephin pending stool and urine  culture.  Over the next couple of days the patient continued to have  significant amount of nausea and also developed a headache.  GI folks  were consulted.  They recommended doing a CT scan and pelvis.  This was  done without any noticeable diagnoses emerging.  Stool cultures and  urine cultures returned normal.  On  the day of discharge, the patient  was feeling somewhat better.  She had not vomited in the past 24 hours.  Her abdominal exam was benign.  GI folks felt that she needed to have a  follow-up upper GI with small-bowel follow-through.  This was scheduled.  Of note, her potassium had also resolved.  The patient was discharged  home with diagnosis and disposition as noted above.      Donna Bernard, M.D.  Electronically Signed    WSL/MEDQ  D:  09/05/2007  T:  09/05/2007  Job:  [id number]

## 2010-10-17 NOTE — Group Therapy Note (Signed)
NAMEAARYANA, BETKE             ACCOUNT NO.:  [id number]   MEDICAL RECORD NO.:  [id number]          PATIENT TYPE:  INP   LOCATION:  A301                          FACILITY:  [facility]   PHYSICIAN:  Scott A. Gerda Diss, MD    DATE OF BIRTH:  11-30-1980   DATE OF PROCEDURE:  08/14/2007  DATE OF DISCHARGE:                                 PROGRESS NOTE   The patient is able to take some liquids. Might try little bit more. Had  some diarrhea last night, some nausea, no vomiting this morning and just  woke up though.  She is on ceftriaxone.  She states she feels like her  kidneys are hurting in her back.   PHYSICAL EXAMINATION:  LUNGS:  Clear.  HEART:  Regular.  ABDOMEN:  Mild tenderness to percussion on the right side does. Not  appear in acute distress.   We will advance diet.  Recheck urine. Continue the ceftriaxone and  possibly home later today but for the most part right now to stay where  she is at.      Scott A. Gerda Diss, MD  Electronically Signed     SAL/MEDQ  D:  08/14/2007  T:  08/15/2007  Job:  [id number]

## 2010-10-17 NOTE — Consult Note (Signed)
[name], [name]             ACCOUNT NO.:  [id number]   MEDICAL RECORD NO.:  [id number]          PATIENT TYPE:  INP   LOCATION:  A301                          FACILITY:  [facility]   PHYSICIAN:  R. Roetta Sessions, M.D. DATE OF BIRTH:  08-02-1980   DATE OF CONSULTATION:  08/15/2007  DATE OF DISCHARGE:                                 CONSULTATION   REASON FOR CONSULTATION:  Nausea, vomiting, diarrhea, abdominal pain.   HISTORY OF PRESENT ILLNESS:  The patient is a 30 year old Caucasian  female who was admitted a couple of days ago with protracted nausea and  vomiting, diarrhea, and abdominal pain.  Symptoms all began about two  weeks ago.  She started headache and sinus drainage.  She states she  went to urgent care on Friday, and was given Cefzil.  On Saturday  morning she woke up with nausea and eventually vomiting and diarrhea.  Symptoms lasted over the weekend.  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  However, after a couple of days she states the symptoms hit me hard.  For over a week she has had persistent nausea and vomiting, dry heaves,  epigastric pain, and diarrhea.  She states she vomited all day  yesterday.  She only had two watery stools yesterday which were  nonbloody.  Her rotavirus came back negative.  Her stool culture is  pending.  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  She  required approximately seven EGD's with dilation of the stricture.  She  states she had a PIC line and was on TPN for over four or five months.  Her last EGD was about a year ago.  She states they thought she had an  ulcer, but this was negative.  With these symptoms she has actually been  to the ED in [place], and states she was given IV fluids on two  different occasions.   MEDICATIONS AT HOME:  1. Abilify 5 mg daily.  2. Trazodone 100 mg q.h.s.  3. Klonopin 0.5 mg q.h.s. p.r.n.  4. Celexa 20 mg daily.  5. Vitamins daily.  6. Calcium daily.  7. B12 daily.  8. Lexapro 20 mg daily.   ALLERGIES:  KEFLEX, CEPHALOSPORINS listed as causing nausea, but she is  not aware of this.  CODEINE causes nausea.   PAST MEDICAL HISTORY:  Type 2 diabetes mellitus.  Asthma all improved  since weight loss from gastric bypass.  She has PCOS.  Gastric bypass in  2006.  She lost over 80 pounds.  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  TPN for over  four months as outlined above.  She reports her last EGD to be about a  year ago was unremarkable except for postoperative changes.  She also  has a history of frequent UTI's.  She states she has been hospitalized  for pyelonephritis  back in 2007.  She denies having any workup for  frequent UTI's.  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  She had right knee surgery twice for cartilage repair.  She  had benign breast biopsy, left inguinal hernia repair, tonsillectomy.  She has depression and anxiety.  Has been on anti-depression medications  since age 47.  She states she was hospitalized last year when she was in  [place] for mental issues and was given a diagnosis, but she cannot  remember the name.   FAMILY HISTORY:  Positive for hypertension, diabetes.  Negative for  colorectal cancer, IBD, peptic ulcer disease, liver disease.   SOCIAL HISTORY:  She is married.  She has no children.  She is an early  developmental childhood Runner, broadcasting/film/video.  Denies any tobacco or alcoholic drug  use.   REVIEW OF SYSTEMS:  See HPI for GI.  CONSTITUTION:  She has lost 14  pounds in the last two weeks.  CARDIOPULMONARY:  No chest pain or  shortness of breath.  GENITOURINARY:  Frequent UTI's.   PHYSICAL EXAMINATION:  VITAL SIGNS:  T-max 99.5, T current 98.3, pulse  82,  respirations 16, blood pressure 124/81.  GENERAL:  Pleasant mildly obese Caucasian female in no acute distress.  SKIN:  Warm and dry, no jaundice.  HEENT:  Sclerae nonicteric, oropharyngeal mucosa moist and pink.  No  lymphadenopathy.  CHEST:  Clear to auscultation.  CARDIAC:  Reveals regular rate and rhythm, normal S1, S2.  No murmurs,  rubs or gallops.  ABDOMEN:  Positive bowel sounds, soft, nondistended.  She has mild  epigastric tenderness to deep palpation.  No rebound or guarding.  No  organomegaly or masses.  EXTREMITIES:  Lower extremities:  No edema.   LABORATORY DATA:  White count 12,000.  It was 12,300 on admission.  Hemoglobin 13.6, platelets 286,000.  Total bilirubin 0.6, alkaline  phosphatase 55.  AST 15, ALT 14.  Albumin 3.2.  Sodium 137, potassium 4,  BUN 3, creatinine 0.71, glucose 97.  Amylase 48.   IMPRESSION:  The patient is a 30 year old lady with persistent nausea,  vomiting, diarrhea, and epigastric tenderness.  Diarrhea seems to be  improving with only two watery stools yesterday.  She had persistent  vomiting all day yesterday.  Thus far her rotavirus was negative.  Stool cultures pending as well as urine culture.  She continues to have  mild leukocytosis.  She is being treated with Rocephin.  Predominant  symptom at this point is persistent nausea, vomiting, epigastric pain.  This may very well be a protracted gastroenteritis type picture.  I do  agree, however, with Dr. Lubertha South that we need to rule out upper  gastrointestinal obstruction given her prior history of anastomotic  stricture requiring multiple dilations status post gastric bypass  surgery.   RECOMMENDATIONS:  1. Continue supportive measures.  2. Continue PPI.  3. Will follow up stool and urine cultures.  4. Follow up upper GI series as available.  5. Further recommendations to follow.   I would like to thank Dr. Lubertha South for allowing [place] to take part in  the care of this  patient.      Tana Coast, P.AJonathon Bellows, M.D.  Electronically Signed    LL/MEDQ  D:  08/15/2007  T:  08/15/2007  Job:  [id number]   cc:   Donna Bernard, M.D.  Fax: [phone]

## 2010-10-17 NOTE — H&P (Signed)
[name], [name]             ACCOUNT NO.:  [id number]   MEDICAL RECORD NO.:  [id number]          PATIENT TYPE:  OBV   LOCATION:  A301                          FACILITY:  [facility]   PHYSICIAN:  Donna Bernard, M.D.DATE OF BIRTH:  12-27-1980   DATE OF ADMISSION:  08/12/2007  DATE OF DISCHARGE:  LH                              HISTORY & PHYSICAL   CHIEF COMPLAINT:  Vomiting, dry heaves, abdominal pain, multiple trips  to the emergency room.   SUBJECTIVE:  This patient is a 30 year old white female with a prior  history of type 2 diabetes, PCOS, and asthma, who presents to the office  the day of admission with ongoing acute concerns.  On the latter part of  last week the patient developed nausea which proceeded to vomiting.  This was also accompanied by diarrhea.  The patient had a low-grade  fever.  Of note, several days prior she had been seen in Urgent Care  with headache, cough, congestion and frontal headache.  She was given  Cefzil.  She was unable to take this, though, because soon her nausea  and vomiting developed.  Through the weekend she actually had two  separate visits to the ER with IV fluids and workup.  Blood work then  showed mild elevation in white blood count.  The patient has continued  to have nausea and vomiting.  She has had six runny stools so far today.  She is lightheaded when she stands up.  She has vomited fiber six times  today.  She now states she is having dry heaves.  The patient is  accompanied by her mother.  The patient and family are very frustrated.  She cannot seem to shake these symptoms and, if anything, it seems to be  getting worse.  The patient also notes fairly severe epigastric pain,  crampy in nature, minimal radiation.   PRIOR MEDICAL HISTORY:  Significant for type 2 diabetes, PCOS and  asthma.  All have been improved after a gastric bypass.   PAST SURGERIES:  1. Remote tonsillectomy.  2. Remote herniorrhaphy.  3. Remote breast  biopsy.  4. Remote knee surgery.  5. Remote cholecystectomy.  6. 2007, hospitalization for pyelonephritis.  7. 2006, gastric bypass.   FAMILY HISTORY:  Positive for hypertension and diabetes.   ALLERGIES:  KEFLEX causes nausea, CODEINE, nausea.   SOCIAL HISTORY:  The patient is married.  No children.  She is an early  developmental childhood Runner, broadcasting/film/video.  No tobacco or alcohol use.   The patient claims compliance with her chronic medications, which  include:  1. Abilify 5 mg daily.  2. Trazodone 100 mg nightly.  3. Klonopin 0.5 mg nightly. p.r.n.  4. Celexa 20 mg daily.  5. Vitamin supplement and calcium daily.   REVIEW OF SYSTEMS:  Otherwise negative.   BP 140/95 both lying and sitting up, pulse 120.  The patient is alert, moderate malaise, actively vomiting.  HEENT:  Mucous membranes somewhat dry.  TMs normal.  NECK:  Supple.  LUNGS:  Clear.  HEART:  Regular rate and rhythm.  No tachycardia.  ABDOMEN:  Significant  epigastric tenderness.  No rebound, no guarding.  Good bowel sounds, hyperactive bowel sounds actually.  EXTREMITIES:  Normal.  SKIN:  Normal.   SIGNIFICANT LABS:  Urine pregnancy test in the office negative.  CBC:  12,500 white blood count with 14 hemoglobin.  MET-7:  Low potassium of  2.8, BUN 5, creatinine 0.7, glucose 108.  Urine pregnancy test negative.   IMPRESSION:  Severe gastroenteritis, likely Rotavirus.  The patient has  had failure on outpatient therapy.  She has actually had two separate  trips in the last 3 days to the emergency room with IV fluids and both  times and had recurrence her symptoms soon after leaving the hospital.   PLAN:  Observation and IV fluids, Zofran, culture stool, check  Rotavirus, maintain mental health medications.  Further orders as noted  on the chart.      Donna Bernard, M.D.  Electronically Signed     WSL/MEDQ  D:  08/12/2007  T:  08/13/2007  Job:  [id number]

## 2010-10-20 NOTE — Op Note (Signed)
[facility]  Patient:    [name], [name] Visit Number: [id number] MRN: [id number]          Service Type: SUR Location: [id number] Attending Physician:  [name] Dictated by:   Anselm Pancoast. Zachery Dakins, M.D. Proc. Date: 11/18/01 Admit Date:  11/18/2001 Discharge Date: 11/19/2001   CC:         Dr. Selinda Flavin, [facility] in [place]   Operative Report  PREOPERATIVE DIAGNOSIS:  Biliary dyskinesia.  POSTOPERATIVE DIAGNOSIS:  Biliary dyskinesia.  OPERATION:  Laparoscopic cholecystectomy with cholangiogram.  SURGEON:  Anselm Pancoast. Zachery Dakins, M.D.  ASSISTANT:  Nurse.  HISTORY:  The patient is a 30 year old Caucasian female who was referred to our office by Dr. Selinda Flavin for management of symptomatic gallbladder.  The patient is slightly overweight and has been recently diagnosed as a mild diabetic on oral medications, and has been having episodes of epigastric pain which first was evaluated at [facility] with an ultrasound and no gallstones were demonstrated, and she has had similar episodes of pain, and these were repeated, and then a hepatobiliary scan stimulated was performed over in [place].  The results showed only about a 15% ejection fraction.  The patient however said that she did not receive the half-and-half as the study stated, and I talked to Dr. Anselmo Pickler who said that there were three that they actually had done was CCK stimulated that were just typed wrong, and on checking this, she definitely had been given the CCK, and I have discussed this with the patient.  She said that they probably had given her something in the IV, and she desired to proceed on with a laparoscopic cholecystectomy.  Her laboratory studies, CBC, and liver function studies were normal preoperatively.  DESCRIPTION OF PROCEDURE:  The patient was given 3 g of Unasyn, PAS stockings, and she weighs about 200 pounds, and was  positioned on the OR table with induction of general anesthesia.  The abdomen was prepped with Betadine surgical solution and draped in a sterile manner.  A small incision was made in the umbilicus and the fascia identified.  This was picked up between two Kochers and then a small opening made through the fascia done with the Tresa Endo which was used to carefully enter into the peritoneal cavity.  A pursestring suture of 0 Vicryl was placed and the Hasson cannula introduced.  The gallbladder was very intense and sort of intrahepatic distally, not acutely inflamed, and no evidence of any adhesions were noted.  I saw no other definite abnormalities and the upper 10 mm trocar was placed in the subxiphoid area, and the two lateral 5 mm trocars were placed, and I anesthetized the fascia with Marcaine.  The gallbladder was grasped upward and outward, and I very carefully opened the peritoneum proximally and the cystic artery was separated from the cystic duct, first doubly clipped proximally and singly distally divided, and then the cystic duct was encompassed.  I placed a clip flush with the neck of the junction of the cystic duct gallbladder.  I then made a small opening proximally and the [facility] catheter was introduced.  There was no gravel or little stones in the cystic duct.  The catheter was held in place with a clip and then an x-ray obtained.  She had a very small extrahepatic biliary system with good flow into the duodenum, and no evidence of any abnormalities that I could see.  I then removed the catheter and triply  clipped the short cystic duct and divided it, and then identified the posterior artery which was doubly clipped proximally singly and distally divided.  I then removed the gallbladder from its bed using the hook electrocautery.  The bile was spilled and good hemostasis was obtained, and then after the area was completely free, we switched the camera to the upper 10 mm port and  withdrew the gallbladder through the umbilical fascial defect.  Later, I then inspected the gallbladder and opened it, and there were no stones or cholesterolosis that I could identify.  I then closed the abdomen and proceeded to close the umbilical fascia with a second figure-of-eight suture of 0 Vicryl plus the pursestring that I had previously placed.  I anesthetized this with Marcaine and then removed the little irrigating fluid that had been used.  There was good hemostasis and then removed the two lateral 5 mm ports. The carbon dioxide was released and the upper trocar was withdrawn.  The subcutaneous layers were closed with 4-0 Vicryl, and Benzoin and Steri-Strips for the skin.  The patient tolerated the procedure nicely and was sent to the recovery room in stable postoperative condition breathing spontaneously. Dictated by:   Anselm Pancoast. Zachery Dakins, M.D. Attending Physician:  [name] DD:  11/18/01 TD:  11/19/01 Job: [id number]

## 2010-11-09 ENCOUNTER — Encounter (INDEPENDENT_AMBULATORY_CARE_PROVIDER_SITE_OTHER): Payer: BC Managed Care – PPO | Admitting: Psychiatry

## 2010-11-09 DIAGNOSIS — F331 Major depressive disorder, recurrent, moderate: Secondary | ICD-10-CM

## 2010-12-13 ENCOUNTER — Emergency Department (HOSPITAL_COMMUNITY): Payer: BC Managed Care – PPO

## 2010-12-13 ENCOUNTER — Other Ambulatory Visit: Payer: Self-pay

## 2010-12-13 ENCOUNTER — Emergency Department (HOSPITAL_COMMUNITY)
Admission: EM | Admit: 2010-12-13 | Discharge: 2010-12-13 | Disposition: A | Discharge status: Home / Self Care | Payer: BC Managed Care – PPO | Attending: Emergency Medicine | Admitting: Emergency Medicine

## 2010-12-13 ENCOUNTER — Encounter (HOSPITAL_COMMUNITY): Payer: Self-pay

## 2010-12-13 DIAGNOSIS — I10 Essential (primary) hypertension: Secondary | ICD-10-CM | POA: Insufficient documentation

## 2010-12-13 DIAGNOSIS — R079 Chest pain, unspecified: Secondary | ICD-10-CM | POA: Diagnosis present

## 2010-12-13 DIAGNOSIS — E119 Type 2 diabetes mellitus without complications: Secondary | ICD-10-CM | POA: Insufficient documentation

## 2010-12-13 HISTORY — DX: Postgastric surgery syndromes: K91.1

## 2010-12-13 HISTORY — DX: Embolism due to other internal prosthetic devices, implants and grafts, initial encounter: T85.818A

## 2010-12-13 LAB — POCT I-STAT, CHEM 8
BUN: 7 mg/dL (ref 6–23)
Calcium, Ion: 1.06 mmol/L — ABNORMAL LOW (ref 1.12–1.32)
Chloride: 107 mEq/L (ref 96–112)
Creatinine, Ser: 0.5 mg/dL (ref 0.50–1.10)
HCT: 40 % (ref 36.0–46.0)
TCO2: 21 mmol/L (ref 0–100)

## 2010-12-13 MED ORDER — IOHEXOL 350 MG/ML SOLN
100.0000 mL | Freq: Once | INTRAVENOUS | Status: AC | PRN
  Administered 2010-12-13: 100 mL via INTRAVENOUS

## 2010-12-13 MED ORDER — SODIUM CHLORIDE 0.9 % IV BOLUS (SEPSIS)
1000.0000 mL | Freq: Once | INTRAVENOUS | Status: AC
  Administered 2010-12-13: 1000 mL via INTRAVENOUS

## 2010-12-13 NOTE — ED Notes (Signed)
Pt's oxygen level stayed at 100% while walking. Pt ambulated around the nursing desk with no difficultly.

## 2010-12-13 NOTE — ED Notes (Signed)
Returned from xray

## 2010-12-13 NOTE — ED Notes (Signed)
Pt reports was sitting on couch Saturday night and notices chest hurting.  Says her father just recently had a heart attack.

## 2010-12-13 NOTE — ED Notes (Signed)
MD at bedside. 

## 2010-12-13 NOTE — ED Provider Notes (Addendum)
History     Chief Complaint  Patient presents with  . Chest Pain    Chest pain x 4 days   Patient is a 30 y.o. female presenting with chest pain. The history is provided by the patient. No language interpreter was used.  Chest Pain Episode onset: 4 days ago. Chest pain occurs constantly. The chest pain is unchanged. Associated with: nothing. The severity of the pain is moderate. The quality of the pain is described as pressure-like. Radiates to: From right-side chest to left-side chest. Exacerbated by: nothing. Primary symptoms include nausea. Pertinent negatives for primary symptoms include no fever, no shortness of breath, no palpitations, no abdominal pain and no vomiting. She tried nothing for the symptoms. Risk factors include obesity (diabetes, hypertension, hyperlipidemia).  Her past medical history is significant for diabetes, hyperlipidemia and hypertension. Past medical history comments: Gastric bypass   C/o chest pain onset 4 days ago while resting with associated nausea. Notes that chest pain initially resolved for some time the first night after onset and returned the next day and has been persistent since. Describes pain as a pressure to right chest radiating to left chest. Pain is not aggravated or relieved by anything. Denies previous history of similar symptoms, fevers, diaphoresis, vomiting, diarrhea, recent extensive travel, bubbling/burning in throat and pain with active rotation of torso. Denies history of heart trouble. H/o blood clot related to PICC Line several yrs ago.  Patient seen at 11:18AM Past Medical History  Diagnosis Date  . DM (diabetes mellitus)   . HTN (hypertension)   . Fatty liver   . GERD (gastroesophageal reflux disease)   . Hypercholesterolemia   . Anastomotic ulcer     multiple  . S/P endoscopy     last done Oct 2010, multiple, usually emergent due to anastomotic strictures  . S/P colonoscopy 08/2010    normal.  . Dumping syndrome     Past  Surgical History  Procedure Date  . Gastric bypass 2006    Dr. Lily Peer  . Knee surgery     x3  . Tonsilectomy, adenoidectomy, bilateral myringotomy and tubes     as a child  . Left breast surgery     benign  . Cholecystectomy     Family History  Problem Relation Age of Onset  . Diabetes Mother   . Hypertension Mother   . Hyperlipidemia Mother   . Hypertension Father   . Diabetes Father     History  Substance Use Topics  . Smoking status: Never Smoker   . Smokeless tobacco: Not on file  . Alcohol Use: No     occassional social drinker    OB History    Grav Para Term Preterm Abortions TAB SAB Ect Mult Living                  Review of Systems  Constitutional: Negative for fever.  Respiratory: Negative for shortness of breath.   Cardiovascular: Positive for chest pain. Negative for palpitations and leg swelling.       Negative diaphoresis  Gastrointestinal: Positive for nausea. Negative for vomiting, abdominal pain and diarrhea.  All other systems reviewed and are negative.    Physical Exam  BP 130/86  Pulse 72  Temp(Src) 98.8 F (37.1 C) (Oral)  Resp 18  Ht 5\' 4"  (1.626 m)  Wt 230 lb (104.327 kg)  BMI 39.48 kg/m2  SpO2 97%  Physical Exam CONSTITUTIONAL: Well developed/well nourished HEAD AND FACE: Normocephalic/atraumatic EYES: EOMI/PERRL ENMT: Mucous membranes  moist NECK: supple no meningeal signs CV: S1/S2 noted, no murmurs/rubs/gallops noted LUNGS: Lungs are clear to auscultation bilaterally, no apparent distress ABDOMEN: soft, nontender, no rebound or guarding, bowel sounds normal NEURO: Pt is awake/alert, moves all extremitiesx4 EXTREMITIES: pulses normal-PT and DP, full ROM, no edema, bilateral calves non-tender SKIN: warm, color normal PSYCH: no abnormalities of mood noted  ED Course  Procedures  MDM Nursing notes reviewed and considered in documentation Previous records reviewed and considered xrays reviewed and considered ED ECG  REPORT   Date: 12/13/2010   Rate: 71  Rhythm: normal sinus rhythm  QRS Axis: normal  Intervals: normal  ST/T Wave abnormalities: normal  Conduction Disutrbances:none     Pt now reporting pain with deep breathing, reports recent start of contraceptives.  Distant h/o DVT due to indwelling PICC Will send d-dimer Ct chest negative Stable for d/c, suspicion for ACS is low Given IV hydration here     Joya Gaskins, MD 12/13/10 1441  Joya Gaskins, MD 01/08/11 [id number]

## 2010-12-13 NOTE — ED Notes (Signed)
Pt arrives to ed with complaint of mid-sternal chest pain that is radiating to left chest area, admits to nausea, denies any sob, diaphoresis, pain started 4 days ago, has gotten worse since yesterday, pt describes pain as a sharp pressure at times, skin warm and dry, nsr on monitor, ekg done on arrival to ed tx room

## 2010-12-29 ENCOUNTER — Ambulatory Visit: Payer: BC Managed Care – PPO | Admitting: Internal Medicine

## 2011-01-04 ENCOUNTER — Encounter (HOSPITAL_COMMUNITY): Payer: BC Managed Care – PPO | Admitting: Psychiatry

## 2011-01-12 ENCOUNTER — Ambulatory Visit: Payer: BC Managed Care – PPO | Admitting: Internal Medicine

## 2011-01-16 ENCOUNTER — Encounter (INDEPENDENT_AMBULATORY_CARE_PROVIDER_SITE_OTHER): Payer: BC Managed Care – PPO | Admitting: Psychiatry

## 2011-01-16 DIAGNOSIS — F331 Major depressive disorder, recurrent, moderate: Secondary | ICD-10-CM

## 2011-02-26 LAB — HEPATIC FUNCTION PANEL
ALT: 14
AST: 15
Albumin: 3.2 — ABNORMAL LOW
Bilirubin, Direct: 0.2
Indirect Bilirubin: 0.4
Total Bilirubin: 0.6

## 2011-02-26 LAB — DIFFERENTIAL
Basophils Absolute: 0.1
Basophils Relative: 0
Eosinophils Absolute: 0
Eosinophils Relative: 0
Eosinophils Relative: 0
Lymphocytes Relative: 17
Lymphocytes Relative: 21
Lymphs Abs: 2.1
Monocytes Absolute: 0.6
Monocytes Relative: 5
Neutro Abs: 8.9 — ABNORMAL HIGH
Neutrophils Relative %: 74
Neutrophils Relative %: 77

## 2011-02-26 LAB — BASIC METABOLIC PANEL
BUN: 3 — ABNORMAL LOW
BUN: 5 — ABNORMAL LOW
CO2: 26
CO2: 30
Calcium: 8 — ABNORMAL LOW
Calcium: 8.7
Chloride: 105
Creatinine, Ser: 0.65
Creatinine, Ser: 0.71
Creatinine, Ser: 0.73
GFR calc Af Amer: >60
GFR calc Af Amer: >60
GFR calc Af Amer: >60
GFR calc non Af Amer: >60
GFR calc non Af Amer: >60
Glucose, Bld: 108 — ABNORMAL HIGH
Glucose, Bld: 90
Glucose, Bld: 97
Potassium: 2.8 — ABNORMAL LOW
Sodium: 135

## 2011-02-26 LAB — URINALYSIS, ROUTINE W REFLEX MICROSCOPIC
Bilirubin Urine: NEGATIVE
Bilirubin Urine: NEGATIVE
Glucose, UA: NEGATIVE
Hgb urine dipstick: NEGATIVE
Ketones, ur: NEGATIVE
Protein, ur: NEGATIVE
Protein, ur: NEGATIVE
Specific Gravity, Urine: <1.005 — ABNORMAL LOW
Urobilinogen, UA: 0.2
Urobilinogen, UA: 0.2
pH: 6

## 2011-02-26 LAB — CBC
HCT: 39
HCT: 41.3
Hemoglobin: 13.6
Hemoglobin: 14.3
MCHC: 34.7
MCV: 86.9
Platelets: 286
Platelets: 300
RBC: 4.49
RDW: 12.8
RDW: 12.9
WBC: 12.3 — ABNORMAL HIGH

## 2011-02-26 LAB — STOOL CULTURE

## 2011-02-26 LAB — URINE MICROSCOPIC-ADD ON

## 2011-02-26 LAB — ROTAVIRUS ANTIGEN, STOOL: Rotavirus: NEGATIVE

## 2011-03-02 LAB — BASIC METABOLIC PANEL
BUN: 4 — ABNORMAL LOW
BUN: 4 — ABNORMAL LOW
BUN: 5 — ABNORMAL LOW
BUN: 8
CO2: 23
CO2: 24
CO2: 24
CO2: 26
Calcium: 8.6
Calcium: 8.8
Calcium: 9.1
Calcium: 9.2
Chloride: 101
Chloride: 102
Chloride: 109
Creatinine, Ser: 0.73
Creatinine, Ser: 0.73
Creatinine, Ser: 0.75
Creatinine, Ser: 0.76
GFR calc Af Amer: >60
GFR calc Af Amer: >60
GFR calc Af Amer: >60
GFR calc non Af Amer: >60
GFR calc non Af Amer: >60
GFR calc non Af Amer: >60
GFR calc non Af Amer: >60
Glucose, Bld: 106 — ABNORMAL HIGH
Glucose, Bld: 89
Glucose, Bld: 89
Glucose, Bld: 94
Potassium: 3.4 — ABNORMAL LOW
Potassium: 3.7
Potassium: 4.1
Potassium: 4.2
Sodium: 134 — ABNORMAL LOW
Sodium: 137
Sodium: 138

## 2011-03-02 LAB — DIFFERENTIAL
Basophils Absolute: 0
Basophils Absolute: 0
Basophils Relative: 0
Basophils Relative: 1
Eosinophils Absolute: 0
Eosinophils Absolute: 0
Eosinophils Relative: 0
Eosinophils Relative: 1
Lymphocytes Relative: 18
Lymphocytes Relative: 42
Lymphs Abs: 1.8
Lymphs Abs: 3.4
Monocytes Absolute: 0.3
Monocytes Absolute: 0.6
Monocytes Relative: 4
Monocytes Relative: 7
Neutro Abs: 4.1
Neutro Abs: 7.7
Neutrophils Relative %: 50
Neutrophils Relative %: 78 — ABNORMAL HIGH

## 2011-03-02 LAB — HEPATIC FUNCTION PANEL
ALT: 14
AST: 18
Albumin: 4
Alkaline Phosphatase: 77
Bilirubin, Direct: 0.2
Indirect Bilirubin: 0.9
Total Bilirubin: 1.1
Total Protein: 7.2

## 2011-03-02 LAB — URINALYSIS, ROUTINE W REFLEX MICROSCOPIC
Glucose, UA: NEGATIVE
Protein, ur: NEGATIVE
pH: 7.5

## 2011-03-02 LAB — CBC
HCT: 39.1
MCHC: 33.5
Platelets: 214
Platelets: 256
RDW: 13.5
WBC: 8.1

## 2011-03-02 LAB — URINE MICROSCOPIC-ADD ON

## 2011-03-05 LAB — URINE MICROSCOPIC-ADD ON

## 2011-03-05 LAB — POCT I-STAT, CHEM 8
BUN: 6
Creatinine, Ser: 0.8
HCT: 50 — ABNORMAL HIGH
Hemoglobin: 17 — ABNORMAL HIGH
Potassium: 3.1 — ABNORMAL LOW
Sodium: 135
TCO2: 22

## 2011-03-05 LAB — DIFFERENTIAL
Eosinophils Relative: 0
Lymphocytes Relative: 24
Lymphs Abs: 2.1
Neutro Abs: 5.7

## 2011-03-05 LAB — RAPID URINE DRUG SCREEN, HOSP PERFORMED
Benzodiazepines: POSITIVE — AB
Tetrahydrocannabinol: NOT DETECTED

## 2011-03-05 LAB — URINALYSIS, ROUTINE W REFLEX MICROSCOPIC
Nitrite: NEGATIVE
Specific Gravity, Urine: 1.024
pH: 6

## 2011-03-05 LAB — CBC
HCT: 49 — ABNORMAL HIGH
Platelets: 228
RBC: 5.55 — ABNORMAL HIGH
RDW: 13.2
WBC: 8.6

## 2011-03-05 LAB — PROTIME-INR
INR: 6.3
Prothrombin Time: 62.2 — ABNORMAL HIGH

## 2011-03-05 LAB — APTT: aPTT: 50 — ABNORMAL HIGH

## 2011-03-05 LAB — ETHANOL: Alcohol, Ethyl (B): <5

## 2011-03-06 LAB — PROTIME-INR
INR: 1
INR: 1.2
Prothrombin Time: 15.1

## 2011-03-06 LAB — APTT: aPTT: 25

## 2011-03-15 ENCOUNTER — Encounter (INDEPENDENT_AMBULATORY_CARE_PROVIDER_SITE_OTHER): Payer: BC Managed Care – PPO | Admitting: Psychiatry

## 2011-03-15 ENCOUNTER — Encounter (HOSPITAL_COMMUNITY): Payer: BC Managed Care – PPO | Admitting: Psychiatry

## 2011-03-15 DIAGNOSIS — F331 Major depressive disorder, recurrent, moderate: Secondary | ICD-10-CM

## 2011-03-21 DIAGNOSIS — K319 Disease of stomach and duodenum, unspecified: Secondary | ICD-10-CM | POA: Insufficient documentation

## 2011-05-10 ENCOUNTER — Encounter (HOSPITAL_COMMUNITY): Payer: BC Managed Care – PPO | Admitting: Psychiatry

## 2011-05-10 ENCOUNTER — Ambulatory Visit (INDEPENDENT_AMBULATORY_CARE_PROVIDER_SITE_OTHER): Payer: BC Managed Care – PPO | Admitting: Psychiatry

## 2011-05-10 ENCOUNTER — Encounter (HOSPITAL_COMMUNITY): Payer: Self-pay | Admitting: Psychiatry

## 2011-05-10 VITALS — Wt 220.0 lb

## 2011-05-10 DIAGNOSIS — F331 Major depressive disorder, recurrent, moderate: Secondary | ICD-10-CM

## 2011-05-10 MED ORDER — ZOLPIDEM TARTRATE ER 12.5 MG PO TBCR: 12.5000 mg | EXTENDED_RELEASE_TABLET | Freq: Every evening | ORAL | Status: DC | PRN

## 2011-05-10 NOTE — Progress Notes (Signed)
Patient came for her followup appointment. She was admitted in October and November at [facility] for severe nausea or vomiting and headache. She was diagnosed with brain cyst and now she is scheduled to see a neurosurgeon on 17th of this month. Patient is somewhat anxious and nervous but she was told it would be a small procedure. Overall she has been stable on her Symbax and Ambien. She is sleeping better. She denies any agitation anger or mood swings. She denies any crying spells. She is scheduled to have court hearing for her disability on January 22. She reported no side effects of medication.  Mental status examination Patient is casually dressed and well groomed. She appears anxious and nervous and her affect is constricted. She denies any active or passive suicidal thoughts or homicidal thoughts. There no psychotic symptoms present. Her attention and concentration is fair. Her speech is slow but clear and coherent. She denies any auditory or visual hallucination. She's alert and oriented x3. Her insight judgment and pulse control is okay.  Assessment Maj. depressive disorder  Plan I will continue her Ambien CR 12.5 mg at bedtime. She will continue her Symbax which she usually get from pharmaceutical company. I have explained risks and benefits of medication in detail. I recommended to call if she start feeling worsening of her depression. I will see her again in 2 months

## 2011-05-18 ENCOUNTER — Other Ambulatory Visit (HOSPITAL_COMMUNITY): Payer: Self-pay | Admitting: Psychiatry

## 2011-05-21 DIAGNOSIS — E349 Endocrine disorder, unspecified: Secondary | ICD-10-CM | POA: Insufficient documentation

## 2011-07-10 ENCOUNTER — Encounter (HOSPITAL_COMMUNITY): Payer: Self-pay | Admitting: Psychiatry

## 2011-07-10 ENCOUNTER — Ambulatory Visit (INDEPENDENT_AMBULATORY_CARE_PROVIDER_SITE_OTHER): Payer: BC Managed Care – PPO | Admitting: Psychiatry

## 2011-07-10 VITALS — Wt 218.0 lb

## 2011-07-10 DIAGNOSIS — F329 Major depressive disorder, single episode, unspecified: Secondary | ICD-10-CM

## 2011-07-10 MED ORDER — ZOLPIDEM TARTRATE ER 12.5 MG PO TBCR: 12.5000 mg | EXTENDED_RELEASE_TABLET | Freq: Every evening | ORAL | Status: DC | PRN

## 2011-07-10 NOTE — Progress Notes (Signed)
Patient came for her followup appointment. She is a good Christmas. She has been stable on her medication. She denies any agitation anger or mood swings. Her sleep is good with Ambien. Overall she has been stable on her Symbax and Ambien. She is sleeping better. She denies any agitation anger or mood swings. She denies any crying spells. She continues to have some issues with social isolation however her energy and concentration is improved.  Mental status examination Patient is casually dressed and well groomed. She appears anxious and her affect is constricted. She denies any active or passive suicidal thoughts or homicidal thoughts. There no psychotic symptoms present. Her attention and concentration is fair. Her speech is slow, clear and coherent. She denies any auditory or visual hallucination. She's alert and oriented x3. Her insight judgment and pulse control is okay.  Assessment Maj. depressive disorder  Plan I will continue her Ambien CR 12.5 mg at bedtime. She will continue her Symbax which she usually get from pharmaceutical company. I have explained risks and benefits of medication in detail. I recommended to call if she start feeling worsening of her depression. I will see her again in 2 months

## 2011-08-02 DIAGNOSIS — G4733 Obstructive sleep apnea (adult) (pediatric): Secondary | ICD-10-CM | POA: Diagnosis not present

## 2011-08-16 DIAGNOSIS — R109 Unspecified abdominal pain: Secondary | ICD-10-CM | POA: Insufficient documentation

## 2011-08-24 DIAGNOSIS — F411 Generalized anxiety disorder: Secondary | ICD-10-CM | POA: Insufficient documentation

## 2011-08-24 DIAGNOSIS — G4733 Obstructive sleep apnea (adult) (pediatric): Secondary | ICD-10-CM | POA: Insufficient documentation

## 2011-09-04 ENCOUNTER — Encounter (HOSPITAL_COMMUNITY): Payer: Self-pay | Admitting: Psychiatry

## 2011-09-04 ENCOUNTER — Ambulatory Visit (INDEPENDENT_AMBULATORY_CARE_PROVIDER_SITE_OTHER): Payer: BC Managed Care – PPO | Admitting: Psychiatry

## 2011-09-04 ENCOUNTER — Ambulatory Visit (HOSPITAL_COMMUNITY): Payer: BC Managed Care – PPO | Admitting: Psychiatry

## 2011-09-04 VITALS — Wt 221.0 lb

## 2011-09-04 DIAGNOSIS — Z79899 Other long term (current) drug therapy: Secondary | ICD-10-CM

## 2011-09-04 DIAGNOSIS — F329 Major depressive disorder, single episode, unspecified: Secondary | ICD-10-CM

## 2011-09-04 MED ORDER — ZOLPIDEM TARTRATE ER 12.5 MG PO TBCR: 12.5000 mg | EXTENDED_RELEASE_TABLET | Freq: Every evening | ORAL | Status: DC | PRN
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
## 2011-09-05 ENCOUNTER — Other Ambulatory Visit: Payer: Self-pay | Admitting: Advanced Practice Midwife

## 2011-09-20 DIAGNOSIS — R109 Unspecified abdominal pain: Secondary | ICD-10-CM | POA: Diagnosis not present

## 2011-09-20 DIAGNOSIS — IMO0002 Reserved for concepts with insufficient information to code with codable children: Secondary | ICD-10-CM | POA: Diagnosis not present

## 2011-09-26 DIAGNOSIS — M5030 Other cervical disc degeneration, unspecified cervical region: Secondary | ICD-10-CM | POA: Diagnosis not present

## 2011-09-26 DIAGNOSIS — M9981 Other biomechanical lesions of cervical region: Secondary | ICD-10-CM | POA: Diagnosis not present

## 2011-09-27 DIAGNOSIS — M5030 Other cervical disc degeneration, unspecified cervical region: Secondary | ICD-10-CM | POA: Diagnosis not present

## 2011-09-27 DIAGNOSIS — G4733 Obstructive sleep apnea (adult) (pediatric): Secondary | ICD-10-CM | POA: Diagnosis not present

## 2011-09-27 DIAGNOSIS — G4730 Sleep apnea, unspecified: Secondary | ICD-10-CM | POA: Diagnosis not present

## 2011-09-27 DIAGNOSIS — M9981 Other biomechanical lesions of cervical region: Secondary | ICD-10-CM | POA: Diagnosis not present

## 2011-09-28 DIAGNOSIS — G4733 Obstructive sleep apnea (adult) (pediatric): Secondary | ICD-10-CM | POA: Diagnosis not present

## 2011-09-30 DIAGNOSIS — K3189 Other diseases of stomach and duodenum: Secondary | ICD-10-CM | POA: Diagnosis not present

## 2011-09-30 DIAGNOSIS — G8929 Other chronic pain: Secondary | ICD-10-CM | POA: Diagnosis present

## 2011-09-30 DIAGNOSIS — Z9884 Bariatric surgery status: Secondary | ICD-10-CM | POA: Diagnosis not present

## 2011-09-30 DIAGNOSIS — K219 Gastro-esophageal reflux disease without esophagitis: Secondary | ICD-10-CM | POA: Diagnosis not present

## 2011-09-30 DIAGNOSIS — Z885 Allergy status to narcotic agent status: Secondary | ICD-10-CM | POA: Diagnosis not present

## 2011-09-30 DIAGNOSIS — R1013 Epigastric pain: Secondary | ICD-10-CM | POA: Diagnosis not present

## 2011-09-30 DIAGNOSIS — IMO0002 Reserved for concepts with insufficient information to code with codable children: Secondary | ICD-10-CM | POA: Diagnosis present

## 2011-09-30 DIAGNOSIS — N189 Chronic kidney disease, unspecified: Secondary | ICD-10-CM | POA: Diagnosis not present

## 2011-09-30 DIAGNOSIS — R51 Headache: Secondary | ICD-10-CM | POA: Diagnosis not present

## 2011-09-30 DIAGNOSIS — Z9889 Other specified postprocedural states: Secondary | ICD-10-CM | POA: Diagnosis not present

## 2011-09-30 DIAGNOSIS — R109 Unspecified abdominal pain: Secondary | ICD-10-CM | POA: Diagnosis present

## 2011-09-30 DIAGNOSIS — Z881 Allergy status to other antibiotic agents status: Secondary | ICD-10-CM | POA: Diagnosis not present

## 2011-09-30 DIAGNOSIS — F429 Obsessive-compulsive disorder, unspecified: Secondary | ICD-10-CM | POA: Diagnosis present

## 2011-09-30 DIAGNOSIS — Z9089 Acquired absence of other organs: Secondary | ICD-10-CM | POA: Diagnosis not present

## 2011-09-30 DIAGNOSIS — F329 Major depressive disorder, single episode, unspecified: Secondary | ICD-10-CM | POA: Diagnosis not present

## 2011-09-30 DIAGNOSIS — R112 Nausea with vomiting, unspecified: Secondary | ICD-10-CM | POA: Diagnosis not present

## 2011-09-30 DIAGNOSIS — A088 Other specified intestinal infections: Secondary | ICD-10-CM | POA: Diagnosis not present

## 2011-09-30 DIAGNOSIS — Z9109 Other allergy status, other than to drugs and biological substances: Secondary | ICD-10-CM | POA: Diagnosis not present

## 2011-09-30 DIAGNOSIS — E876 Hypokalemia: Secondary | ICD-10-CM | POA: Diagnosis not present

## 2011-09-30 DIAGNOSIS — R1012 Left upper quadrant pain: Secondary | ICD-10-CM | POA: Diagnosis not present

## 2011-09-30 DIAGNOSIS — G4700 Insomnia, unspecified: Secondary | ICD-10-CM | POA: Diagnosis present

## 2011-09-30 DIAGNOSIS — R509 Fever, unspecified: Secondary | ICD-10-CM | POA: Diagnosis not present

## 2011-09-30 DIAGNOSIS — G4733 Obstructive sleep apnea (adult) (pediatric): Secondary | ICD-10-CM | POA: Diagnosis present

## 2011-10-01 DIAGNOSIS — D649 Anemia, unspecified: Secondary | ICD-10-CM | POA: Insufficient documentation

## 2011-10-09 DIAGNOSIS — R1084 Generalized abdominal pain: Secondary | ICD-10-CM | POA: Diagnosis not present

## 2011-10-09 DIAGNOSIS — G4730 Sleep apnea, unspecified: Secondary | ICD-10-CM | POA: Diagnosis not present

## 2011-10-09 DIAGNOSIS — I10 Essential (primary) hypertension: Secondary | ICD-10-CM | POA: Diagnosis not present

## 2011-10-09 DIAGNOSIS — G4700 Insomnia, unspecified: Secondary | ICD-10-CM | POA: Diagnosis not present

## 2011-10-10 DIAGNOSIS — E119 Type 2 diabetes mellitus without complications: Secondary | ICD-10-CM | POA: Diagnosis not present

## 2011-10-10 DIAGNOSIS — Z79899 Other long term (current) drug therapy: Secondary | ICD-10-CM | POA: Diagnosis not present

## 2011-10-10 DIAGNOSIS — E876 Hypokalemia: Secondary | ICD-10-CM | POA: Diagnosis not present

## 2011-10-10 DIAGNOSIS — M9981 Other biomechanical lesions of cervical region: Secondary | ICD-10-CM | POA: Diagnosis not present

## 2011-10-10 DIAGNOSIS — M5030 Other cervical disc degeneration, unspecified cervical region: Secondary | ICD-10-CM | POA: Diagnosis not present

## 2011-10-10 DIAGNOSIS — I10 Essential (primary) hypertension: Secondary | ICD-10-CM | POA: Diagnosis not present

## 2011-10-11 DIAGNOSIS — M5030 Other cervical disc degeneration, unspecified cervical region: Secondary | ICD-10-CM | POA: Diagnosis not present

## 2011-10-11 DIAGNOSIS — M9981 Other biomechanical lesions of cervical region: Secondary | ICD-10-CM | POA: Diagnosis not present

## 2011-10-15 DIAGNOSIS — M5030 Other cervical disc degeneration, unspecified cervical region: Secondary | ICD-10-CM | POA: Diagnosis not present

## 2011-10-15 DIAGNOSIS — M9981 Other biomechanical lesions of cervical region: Secondary | ICD-10-CM | POA: Diagnosis not present

## 2011-10-17 DIAGNOSIS — M5030 Other cervical disc degeneration, unspecified cervical region: Secondary | ICD-10-CM | POA: Diagnosis not present

## 2011-10-17 DIAGNOSIS — M9981 Other biomechanical lesions of cervical region: Secondary | ICD-10-CM | POA: Diagnosis not present

## 2011-10-18 DIAGNOSIS — M9981 Other biomechanical lesions of cervical region: Secondary | ICD-10-CM | POA: Diagnosis not present

## 2011-10-18 DIAGNOSIS — M5030 Other cervical disc degeneration, unspecified cervical region: Secondary | ICD-10-CM | POA: Diagnosis not present

## 2011-10-22 DIAGNOSIS — M9981 Other biomechanical lesions of cervical region: Secondary | ICD-10-CM | POA: Diagnosis not present

## 2011-10-22 DIAGNOSIS — M5030 Other cervical disc degeneration, unspecified cervical region: Secondary | ICD-10-CM | POA: Diagnosis not present

## 2011-10-24 DIAGNOSIS — M5030 Other cervical disc degeneration, unspecified cervical region: Secondary | ICD-10-CM | POA: Diagnosis not present

## 2011-10-24 DIAGNOSIS — M9981 Other biomechanical lesions of cervical region: Secondary | ICD-10-CM | POA: Diagnosis not present

## 2011-10-25 DIAGNOSIS — M5030 Other cervical disc degeneration, unspecified cervical region: Secondary | ICD-10-CM | POA: Diagnosis not present

## 2011-10-25 DIAGNOSIS — M9981 Other biomechanical lesions of cervical region: Secondary | ICD-10-CM | POA: Diagnosis not present

## 2011-10-26 DIAGNOSIS — N94810 Vulvar vestibulitis: Secondary | ICD-10-CM | POA: Diagnosis not present

## 2011-10-26 DIAGNOSIS — Z8669 Personal history of other diseases of the nervous system and sense organs: Secondary | ICD-10-CM | POA: Diagnosis not present

## 2011-10-26 DIAGNOSIS — Z3041 Encounter for surveillance of contraceptive pills: Secondary | ICD-10-CM | POA: Diagnosis not present

## 2011-10-26 DIAGNOSIS — Z124 Encounter for screening for malignant neoplasm of cervix: Secondary | ICD-10-CM | POA: Diagnosis not present

## 2011-10-26 DIAGNOSIS — Z01419 Encounter for gynecological examination (general) (routine) without abnormal findings: Secondary | ICD-10-CM | POA: Diagnosis not present

## 2011-10-26 DIAGNOSIS — Z309 Encounter for contraceptive management, unspecified: Secondary | ICD-10-CM | POA: Diagnosis not present

## 2011-10-26 DIAGNOSIS — B373 Candidiasis of vulva and vagina: Secondary | ICD-10-CM | POA: Diagnosis not present

## 2011-10-26 DIAGNOSIS — E348 Other specified endocrine disorders: Secondary | ICD-10-CM | POA: Diagnosis not present

## 2011-10-26 DIAGNOSIS — R51 Headache: Secondary | ICD-10-CM | POA: Diagnosis not present

## 2011-11-05 DIAGNOSIS — M9981 Other biomechanical lesions of cervical region: Secondary | ICD-10-CM | POA: Diagnosis not present

## 2011-11-05 DIAGNOSIS — R51 Headache: Secondary | ICD-10-CM | POA: Diagnosis not present

## 2011-11-05 DIAGNOSIS — M5030 Other cervical disc degeneration, unspecified cervical region: Secondary | ICD-10-CM | POA: Diagnosis not present

## 2011-11-05 DIAGNOSIS — E348 Other specified endocrine disorders: Secondary | ICD-10-CM | POA: Diagnosis not present

## 2011-11-06 ENCOUNTER — Ambulatory Visit (INDEPENDENT_AMBULATORY_CARE_PROVIDER_SITE_OTHER): Payer: BC Managed Care – PPO | Admitting: Psychiatry

## 2011-11-06 ENCOUNTER — Encounter (HOSPITAL_COMMUNITY): Payer: Self-pay | Admitting: Psychiatry

## 2011-11-06 VITALS — Wt 223.0 lb

## 2011-11-06 DIAGNOSIS — F329 Major depressive disorder, single episode, unspecified: Secondary | ICD-10-CM

## 2011-11-06 MED ORDER — ZOLPIDEM TARTRATE ER 12.5 MG PO TBCR: 12.5000 mg | EXTENDED_RELEASE_TABLET | Freq: Every evening | ORAL | Status: DC | PRN
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
## 2011-11-07 DIAGNOSIS — M5030 Other cervical disc degeneration, unspecified cervical region: Secondary | ICD-10-CM | POA: Diagnosis not present

## 2011-11-07 DIAGNOSIS — M9981 Other biomechanical lesions of cervical region: Secondary | ICD-10-CM | POA: Diagnosis not present

## 2011-11-08 DIAGNOSIS — M5030 Other cervical disc degeneration, unspecified cervical region: Secondary | ICD-10-CM | POA: Diagnosis not present

## 2011-11-08 DIAGNOSIS — M9981 Other biomechanical lesions of cervical region: Secondary | ICD-10-CM | POA: Diagnosis not present

## 2011-11-12 DIAGNOSIS — M9981 Other biomechanical lesions of cervical region: Secondary | ICD-10-CM | POA: Diagnosis not present

## 2011-11-12 DIAGNOSIS — M5030 Other cervical disc degeneration, unspecified cervical region: Secondary | ICD-10-CM | POA: Diagnosis not present

## 2011-11-14 DIAGNOSIS — Z8719 Personal history of other diseases of the digestive system: Secondary | ICD-10-CM | POA: Diagnosis not present

## 2011-11-14 DIAGNOSIS — R109 Unspecified abdominal pain: Secondary | ICD-10-CM | POA: Diagnosis not present

## 2011-11-21 ENCOUNTER — Encounter (HOSPITAL_COMMUNITY): Payer: Self-pay | Admitting: Psychiatry

## 2011-11-21 ENCOUNTER — Ambulatory Visit (INDEPENDENT_AMBULATORY_CARE_PROVIDER_SITE_OTHER): Payer: BC Managed Care – PPO | Admitting: Psychiatry

## 2011-11-21 DIAGNOSIS — F331 Major depressive disorder, recurrent, moderate: Secondary | ICD-10-CM | POA: Diagnosis not present

## 2011-11-21 NOTE — Patient Instructions (Signed)
Discussed orally 

## 2011-11-22 DIAGNOSIS — M9981 Other biomechanical lesions of cervical region: Secondary | ICD-10-CM | POA: Diagnosis not present

## 2011-11-22 DIAGNOSIS — M5030 Other cervical disc degeneration, unspecified cervical region: Secondary | ICD-10-CM | POA: Diagnosis not present
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
## 2011-11-26 DIAGNOSIS — M5030 Other cervical disc degeneration, unspecified cervical region: Secondary | ICD-10-CM | POA: Diagnosis not present

## 2011-11-26 DIAGNOSIS — M9981 Other biomechanical lesions of cervical region: Secondary | ICD-10-CM | POA: Diagnosis not present

## 2011-11-27 ENCOUNTER — Ambulatory Visit (INDEPENDENT_AMBULATORY_CARE_PROVIDER_SITE_OTHER): Payer: BC Managed Care – PPO | Admitting: Psychiatry

## 2011-11-27 DIAGNOSIS — F331 Major depressive disorder, recurrent, moderate: Secondary | ICD-10-CM | POA: Diagnosis not present

## 2011-11-28 DIAGNOSIS — G4730 Sleep apnea, unspecified: Secondary | ICD-10-CM | POA: Diagnosis not present

## 2011-11-28 NOTE — Patient Instructions (Signed)
Discussed orally 

## 2011-11-28 NOTE — Progress Notes (Signed)
Patient:  [name]   DOB: 1981-01-31  MR Number: [id number]  Location: Behavioral Health Center:  [address] [place],  [place], [address]  Start: Tuesday 11/27/2011 4:10 PM End: Tuesday 11/27/2011 4:40 PM  Provider/Observer:     Florencia Reasons, MSW, LCSW   Chief Complaint:      Chief Complaint  Patient presents with  . Depression    Reason For Service:   The patient is resuming services with this clinician as patient is experiencing increased symptoms of anxiety and depression related to chronic health issues. Patient has been seen in this practice since 2009 and continues to see psychiatrist Dr. Lolly Mustache for medication management. She participated in outpatient psychotherapy with this clinician from 04/08/2008 through 08/04/2008. The patient has multiple health issues including dumping syndrome which causes patient to experience chronic pain, constant nausea, and other gastrointestinal issues. The patient reports having to organize her life around medical appointments and medication as some of the side effects include drowsiness and fatigue. She experiences a significant reduction in the quality of her life as she reports she has to lie down most of the day and experiences anxiety when she goes out shopping, socializing, and eating at a restaurant as she fears she will have gastrointestinal issues while away from home. Patient also reports that she has been hoarding hygiene products for the last several months. She reports a compulsion to buy items such as toilet paper, shaving cream, vitamins although she may have plenty of these items at home .Patient reports she has been approved for disability based on depression, anxiety, and dumping syndrome. She reports receiving her first check in April 2013. Patient states she thinks she had been in denial until that point. However, she then began to think " I am going to be this way forever. I will never go to work again". Patient also recently turned  31 years old. Patient reports her illness has taken a toll on her marriage although her husband is very supportive. She also reports stress related to being unable to pursue having children due to her illness and the medication. The patient is seen for follow up appointment today.  Interventions Strategy:  Supportive therapy  Participation Level:   Active  Participation Quality:  Appropriate      Behavioral Observation:  Casual, Alert, and Appropriate.   Current Psychosocial Factors: The patient expresses anxiety about having a procedure on 11/29/2011. She had a negative experience with this procedure 2 weeks ago.  Content of Session:   Reviewing symptoms, identifying stressors, identifying ways to use support system, exploring relaxation techniques  Current Status:   The patient continues to experience depressed mood, excessive worrying, and anxiety.  Patient Progress:   Fair. Patient reports having a conversation with her mother last week to express her feelings and concerns about her condition. Patient reports feeling better as she says mother seems to be more understanding of patient's situation. Patient also reports trying to have more positive communication with her husband but continuing to experience anxiety and frustration. She admits OCD tendencies and perfectionism. Patient continues to experience grief and loss issues regarding her reduced physical functioning and the lost benefits from the weight reduction surgery as patient has regained weight. Patient also expresses anger regarding the surgeon who did her gastric bypass surgery as she states thinking he was deceitful about what happened during the surgery. Patient is anxious about her upcoming medical procedure on Thursday as she experienced severe muscle spasms the last time  she had the procedure. Therapist works with patient to identify calming statements to use, relaxation techniques including diaphragmatic breathing, and ways to use  her support system.  Target Goals:   Establishing rapport, reducing anxiety  Last Reviewed:     Goals Addressed Today:    Establishing rapport, reducing anxiety  Impression/Diagnosis:   Patient presents with a history of symptoms of depression and anxiety since age 60. She has had 2 psychiatric hospitalizations due to to depression and anxiety. Her symptoms have worsened in the past 6 months as she has experienced increased chronic health issues. Her current symptoms include depressed mood, anxiety, sleep difficulty, excessive worrying, panic attacks, feelings of worthlessness, and poor self image. Diagnoses: Major depressive disorder, recurrent, moderate, rule out OCD     Diagnosis:  Axis I:  1. Major depressive disorder, recurrent, moderate             Axis II: Deferred

## 2011-11-29 DIAGNOSIS — R109 Unspecified abdominal pain: Secondary | ICD-10-CM | POA: Diagnosis not present

## 2011-12-11 ENCOUNTER — Ambulatory Visit (INDEPENDENT_AMBULATORY_CARE_PROVIDER_SITE_OTHER): Payer: BC Managed Care – PPO | Admitting: Psychiatry

## 2011-12-11 DIAGNOSIS — F331 Major depressive disorder, recurrent, moderate: Secondary | ICD-10-CM | POA: Diagnosis not present

## 2011-12-12 NOTE — Progress Notes (Signed)
Patient:  [name]   DOB: 1980-07-18  MR Number: [id number]  Location: Behavioral Health Center:  [address] [place],  [place], [address]  Start: Tuesday 12/11/2011 4:10 PM End: Tuesday 12/11/2011 4:55 PM  Provider/Observer:     Florencia Reasons, MSW, LCSW   Chief Complaint:      Chief Complaint  Patient presents with  . Depression    Reason For Service:   The patient is resuming services with this clinician as patient is experiencing increased symptoms of anxiety and depression related to chronic health issues. Patient has been seen in this practice since 2009 and continues to see psychiatrist Dr. Lolly Mustache for medication management. She participated in outpatient psychotherapy with this clinician from 04/08/2008 through 08/04/2008. The patient has multiple health issues including dumping syndrome which causes patient to experience chronic pain, constant nausea, and other gastrointestinal issues. The patient reports having to organize her life around medical appointments and medication as some of the side effects include drowsiness and fatigue. She experiences a significant reduction in the quality of her life as she reports she has to lie down most of the day and experiences anxiety when she goes out shopping, socializing, and eating at a restaurant as she fears she will have gastrointestinal issues while away from home. Patient also reports that she has been hoarding hygiene products for the last several months. She reports a compulsion to buy items such as toilet paper, shaving cream, vitamins although she may have plenty of these items at home .Patient reports she has been approved for disability based on depression, anxiety, and dumping syndrome. She reports receiving her first check in April 2013. Patient states she thinks she had been in denial until that point. However, she then began to think " I am going to be this way forever. I will never go to work again". Patient also recently turned 31  years old. Patient reports her illness has taken a toll on her marriage although her husband is very supportive. She also reports stress related to being unable to pursue having children due to her illness and the medication. The patient is seen for follow up appointment today.  Interventions Strategy:  Supportive therapy  Participation Level:   Active  Participation Quality:  Appropriate      Behavioral Observation:  Casual, Alert, and Appropriate.   Current Psychosocial Factors: The patient expresses having increased health issues and pain due to to an adverse reaction to her recent medical procedure.  Content of Session:   Reviewing symptoms, developing treatment plan, examining thought patterns and effects on mood and behavior  Current Status:   The patient continues to experience depressed mood, excessive worrying, and anxiety.  Patient Progress:   Fair. Patient reports that the needle accidentally touched a nerve during a recent medical procedure. This is the same procedure patient had 4 or 5 weeks ago, the same situation occurred at that time. Patient reports the most recent procedure cause extremely elevated blood pressure resulted in patient becoming very upset and requiring oxygen. This also resulted in patient having severe muscle spasms in her back. Patient reports staying on her couch and unable to do anything for about 2 weeks. This past weekend, she did go for a motorcycle ride with her husband in state overnight in a hotel. However, patient reports still continuing to suffer constant nausea. Patient continues to experience anxiety and depressed mood regarding her current health. She also continues to struggle with negative thought patterns and grief and  loss issues over her reduced physical functioning. Therapist works with patient to develop a treatment plan.    Target Goals:   1. Decrease anxiety and excessive worry. 2.Improve mood. 3.  Reduce obsessive-compulsive behavior as  evidenced by eliminating stockpiling hygiene products. 4. Process grief and loss issues.  Last Reviewed:   12/11/2011  Goals Addressed Today:    Decrease anxiety and excessive worry, improve mood   Impression/Diagnosis:   Patient presents with a history of symptoms of depression and anxiety since age 55. She has had 2 psychiatric hospitalizations due to to depression and anxiety. Her symptoms have worsened in the past 6 months as she has experienced increased chronic health issues. Her current symptoms include depressed mood, anxiety, sleep difficulty, excessive worrying, panic attacks, feelings of worthlessness, and poor self image. Diagnoses: Major depressive disorder, recurrent, moderate, rule out OCD     Diagnosis:  Axis I:  1. Major depressive disorder, recurrent, moderate             Axis II: Deferred

## 2011-12-12 NOTE — Patient Instructions (Signed)
Discussed orally 

## 2011-12-17 DIAGNOSIS — L570 Actinic keratosis: Secondary | ICD-10-CM | POA: Diagnosis not present

## 2011-12-17 DIAGNOSIS — L719 Rosacea, unspecified: Secondary | ICD-10-CM | POA: Diagnosis not present

## 2011-12-17 DIAGNOSIS — L259 Unspecified contact dermatitis, unspecified cause: Secondary | ICD-10-CM | POA: Diagnosis not present

## 2011-12-19 ENCOUNTER — Ambulatory Visit (HOSPITAL_COMMUNITY): Payer: Self-pay | Admitting: Psychiatry

## 2011-12-24 ENCOUNTER — Ambulatory Visit (INDEPENDENT_AMBULATORY_CARE_PROVIDER_SITE_OTHER): Payer: BC Managed Care – PPO | Admitting: Psychiatry

## 2011-12-24 ENCOUNTER — Ambulatory Visit (HOSPITAL_COMMUNITY): Payer: Self-pay | Admitting: Psychiatry

## 2011-12-24 DIAGNOSIS — F331 Major depressive disorder, recurrent, moderate: Secondary | ICD-10-CM

## 2011-12-24 NOTE — Progress Notes (Signed)
Patient:  [name]   DOB: 11-22-1980  MR Number: [id number]  [facility]: [facility]:  [address] [place],  [place], [address]  Start: Monday 12/24/2011 10:05 AM End: Monday 12/24/2011 10:55 AM  Provider/Observer:     Florencia Reasons, MSW, LCSW   Chief Complaint:      Chief Complaint  Patient presents with  . Anxiety  . Depression    Reason For Service:   The patient is resuming services with this clinician as patient is experiencing increased symptoms of anxiety and depression related to chronic health issues. Patient has been seen in this practice since 2009 and continues to see psychiatrist Dr. Lolly Mustache for medication management. She participated in outpatient psychotherapy with this clinician from 04/08/2008 through 08/04/2008. The patient has multiple health issues including dumping syndrome which causes patient to experience chronic pain, constant nausea, and other gastrointestinal issues. The patient reports having to organize her life around medical appointments and medication as some of the side effects include drowsiness and fatigue. She experiences a significant reduction in the quality of her life as she reports she has to lie down most of the day and experiences anxiety when she goes out shopping, socializing, and eating at a restaurant as she fears she will have gastrointestinal issues while away from home. Patient also reports that she has been hoarding hygiene products for the last several months. She reports a compulsion to buy items such as toilet paper, shaving cream, vitamins although she may have plenty of these items at home .Patient reports she has been approved for disability based on depression, anxiety, and dumping syndrome. She reports receiving her first check in April 2013. Patient states she thinks she had been in denial until that point. However, she then began to think " I am going to be this way forever. I will never go to work again". Patient also  recently turned 31 years old. Patient reports her illness has taken a toll on her marriage although her husband is very supportive. She also reports stress related to being unable to pursue having children due to her illness and the medication. The patient is seen for follow up appointment today.  Interventions Strategy:  Supportive therapy, cognitive behavioral therapy  Participation Level:   Active  Participation Quality:  Appropriate      Behavioral Observation:  Casual, Alert, and Depressed, Anxious  Current Psychosocial Factors: The patient expresses having increased health issues and pain.  Content of Session:   Reviewing symptoms, processing feelings, exploring relaxation techniques practicing a mindfulness activity using breath awareness  Current Status:   The patient reports increassed depressed mood, anxiety and sleep difficulty.  Patient Progress:   Fair. Patient reports experiencing increased health issues last week including severe diarrhea and vomiting that kept patient confined to her home for most of the week. Patient reports becoming more depressed and hopeless. She continues to have strong support from her husband but expresses increased frustration with her health issues and effects on her physical functioning and her marriage. Patient reports having acupuncture and participating in a physical therapy session last week. She plans continued participation and is hopeful that these strategies will work. Patient continues to experience significant anxiety. Therapist works with patient to practice a mindfulness exercise using breath awareness.   Target Goals:   1. Decrease anxiety and excessive worry. 2.Improve mood. 3.  Reduce obsessive-compulsive behavior as evidenced by eliminating stockpiling hygiene products. 4. Process grief and loss issues.  Last Reviewed:  12/11/2011  Goals Addressed Today:    Decrease anxiety and excessive worry, improve  mood   Impression/Diagnosis:   Patient presents with a history of symptoms of depression and anxiety since age 41. She has had 2 psychiatric hospitalizations due to to depression and anxiety. Her symptoms have worsened in the past 6 months as she has experienced increased chronic health issues. Her current symptoms include depressed mood, anxiety, sleep difficulty, excessive worrying, panic attacks, feelings of worthlessness, and poor self image. Diagnoses: Major depressive disorder, recurrent, moderate, rule out OCD     Diagnosis:  Axis I:  1. Major depressive disorder, recurrent, moderate             Axis II: Deferred

## 2011-12-24 NOTE — Patient Instructions (Signed)
Discussed orally 

## 2012-01-01 ENCOUNTER — Encounter (HOSPITAL_COMMUNITY): Payer: Self-pay | Admitting: *Deleted

## 2012-01-01 ENCOUNTER — Ambulatory Visit (INDEPENDENT_AMBULATORY_CARE_PROVIDER_SITE_OTHER): Payer: BC Managed Care – PPO | Admitting: Psychiatry

## 2012-01-01 ENCOUNTER — Inpatient Hospital Stay (HOSPITAL_COMMUNITY)
Admission: RE | Admit: 2012-01-01 | Discharge: 2012-01-04 | DRG: 430 | Disposition: A | Discharge status: Home / Self Care | Payer: BC Managed Care – PPO | Attending: Psychiatry | Admitting: Psychiatry

## 2012-01-01 ENCOUNTER — Ambulatory Visit (HOSPITAL_COMMUNITY): Payer: Self-pay | Admitting: Psychiatry

## 2012-01-01 ENCOUNTER — Encounter (HOSPITAL_COMMUNITY): Payer: Self-pay | Admitting: Psychiatry

## 2012-01-01 VITALS — BP 143/108 | HR 88 | Wt 223.8 lb

## 2012-01-01 DIAGNOSIS — Z9149 Other personal history of psychological trauma, not elsewhere classified: Secondary | ICD-10-CM

## 2012-01-01 DIAGNOSIS — K219 Gastro-esophageal reflux disease without esophagitis: Secondary | ICD-10-CM | POA: Diagnosis not present

## 2012-01-01 DIAGNOSIS — F329 Major depressive disorder, single episode, unspecified: Secondary | ICD-10-CM

## 2012-01-01 DIAGNOSIS — F429 Obsessive-compulsive disorder, unspecified: Secondary | ICD-10-CM | POA: Diagnosis not present

## 2012-01-01 DIAGNOSIS — F332 Major depressive disorder, recurrent severe without psychotic features: Principal | ICD-10-CM | POA: Diagnosis present

## 2012-01-01 DIAGNOSIS — Z79899 Other long term (current) drug therapy: Secondary | ICD-10-CM | POA: Diagnosis not present

## 2012-01-01 DIAGNOSIS — Z8711 Personal history of peptic ulcer disease: Secondary | ICD-10-CM | POA: Diagnosis not present

## 2012-01-01 DIAGNOSIS — I10 Essential (primary) hypertension: Secondary | ICD-10-CM | POA: Diagnosis not present

## 2012-01-01 DIAGNOSIS — K7689 Other specified diseases of liver: Secondary | ICD-10-CM | POA: Diagnosis not present

## 2012-01-01 DIAGNOSIS — K911 Postgastric surgery syndromes: Secondary | ICD-10-CM | POA: Diagnosis not present

## 2012-01-01 DIAGNOSIS — R45851 Suicidal ideations: Secondary | ICD-10-CM | POA: Diagnosis not present

## 2012-01-01 DIAGNOSIS — F32A Depression, unspecified: Secondary | ICD-10-CM | POA: Diagnosis present

## 2012-01-01 LAB — CBC
HCT: 42.5 % (ref 36.0–46.0)
MCH: 30 pg (ref 26.0–34.0)
MCV: 90.4 fL (ref 78.0–100.0)
RBC: 4.7 MIL/uL (ref 3.87–5.11)
RDW: 13.5 % (ref 11.5–15.5)
WBC: 10.6 10*3/uL — ABNORMAL HIGH (ref 4.0–10.5)

## 2012-01-01 LAB — URINALYSIS, ROUTINE W REFLEX MICROSCOPIC
Bilirubin Urine: NEGATIVE
Ketones, ur: NEGATIVE mg/dL
Nitrite: NEGATIVE
Protein, ur: NEGATIVE mg/dL
Urobilinogen, UA: 0.2 mg/dL (ref 0.0–1.0)

## 2012-01-01 LAB — RAPID URINE DRUG SCREEN, HOSP PERFORMED: Barbiturates: NOT DETECTED

## 2012-01-01 LAB — COMPREHENSIVE METABOLIC PANEL
Albumin: 3.6 g/dL (ref 3.5–5.2)
Alkaline Phosphatase: 70 U/L (ref 39–117)
BUN: 8 mg/dL (ref 6–23)
Calcium: 9.8 mg/dL (ref 8.4–10.5)
Chloride: 102 mEq/L (ref 96–112)
Creatinine, Ser: 0.58 mg/dL (ref 0.50–1.10)
GFR calc Af Amer: >90 mL/min (ref >90)
GFR calc non Af Amer: >90 mL/min (ref >90)
Potassium: 3.8 mEq/L (ref 3.5–5.1)
Total Bilirubin: 0.3 mg/dL (ref 0.3–1.2)

## 2012-01-01 LAB — URINE MICROSCOPIC-ADD ON

## 2012-01-01 MED ORDER — ACETAMINOPHEN 325 MG PO TABS
650.0000 mg | ORAL_TABLET | Freq: Four times a day (QID) | ORAL | Status: DC | PRN
  Administered 2012-01-02: 650 mg via ORAL

## 2012-01-01 MED ORDER — ZOLPIDEM TARTRATE 5 MG PO TABS: 5.0000 mg | ORAL_TABLET | Freq: Every evening | ORAL | Status: DC | PRN

## 2012-01-01 MED ORDER — NORGESTIM-ETH ESTRAD TRIPHASIC 0.18/0.215/0.25 MG-25 MCG PO TABS
1.0000 | ORAL_TABLET | Freq: Every day | ORAL | Status: DC
  Administered 2012-01-01 – 2012-01-04 (×4): 1 via ORAL

## 2012-01-01 MED ORDER — COLESEVELAM HCL 625 MG PO TABS
1875.0000 mg | ORAL_TABLET | Freq: Two times a day (BID) | ORAL | Status: DC
  Administered 2012-01-02 – 2012-01-04 (×5): 1875 mg via ORAL
  Filled 2012-01-01 (×9): qty 3

## 2012-01-01 MED ORDER — SUMATRIPTAN SUCCINATE 50 MG PO TABS
50.0000 mg | ORAL_TABLET | Freq: Once | ORAL | Status: AC
  Administered 2012-01-01: 50 mg via ORAL
  Filled 2012-01-01 (×2): qty 1

## 2012-01-01 MED ORDER — CALCIUM CARBONATE-VITAMIN D 500-200 MG-UNIT PO TABS
0.5000 | ORAL_TABLET | Freq: Every day | ORAL | Status: DC
  Administered 2012-01-01: 0.5 via ORAL
  Filled 2012-01-01: qty 1
  Filled 2012-01-01 (×3): qty 0.5

## 2012-01-01 MED ORDER — GABAPENTIN 300 MG PO CAPS
300.0000 mg | ORAL_CAPSULE | Freq: Two times a day (BID) | ORAL | Status: DC
  Administered 2012-01-01 – 2012-01-02 (×2): 300 mg via ORAL
  Filled 2012-01-01 (×5): qty 1

## 2012-01-01 MED ORDER — ALUM & MAG HYDROXIDE-SIMETH 200-200-20 MG/5ML PO SUSP
30.0000 mL | ORAL | Status: DC | PRN
  Administered 2012-01-03: 30 mL via ORAL

## 2012-01-01 MED ORDER — ONDANSETRON HCL 4 MG PO TABS
8.0000 mg | ORAL_TABLET | Freq: Three times a day (TID) | ORAL | Status: DC | PRN
  Administered 2012-01-02: 8 mg via ORAL
  Filled 2012-01-01: qty 1

## 2012-01-01 MED ORDER — OLANZAPINE-FLUOXETINE HCL 6-50 MG PO CAPS: 1.0000 | ORAL_CAPSULE | Freq: Every evening | ORAL | Status: DC

## 2012-01-01 MED ORDER — VERAPAMIL HCL 80 MG PO TABS
80.0000 mg | ORAL_TABLET | Freq: Every day | ORAL | Status: DC
  Administered 2012-01-01 – 2012-01-04 (×4): 80 mg via ORAL
  Filled 2012-01-01 (×9): qty 1

## 2012-01-01 MED ORDER — OLANZAPINE-FLUOXETINE HCL 6-50 MG PO CAPS
1.0000 | ORAL_CAPSULE | Freq: Every evening | ORAL | Status: DC
  Filled 2012-01-01 (×2): qty 1

## 2012-01-01 MED ORDER — MAGNESIUM HYDROXIDE 400 MG/5ML PO SUSP
30.0000 mL | Freq: Every day | ORAL | Status: DC | PRN
  Administered 2012-01-03 – 2012-01-04 (×2): 30 mL via ORAL

## 2012-01-01 MED ORDER — TIZANIDINE HCL 2 MG PO TABS
4.0000 mg | ORAL_TABLET | Freq: Three times a day (TID) | ORAL | Status: DC
  Administered 2012-01-01 – 2012-01-04 (×9): 4 mg via ORAL
  Filled 2012-01-01 (×3): qty 2
  Filled 2012-01-01: qty 1
  Filled 2012-01-01 (×7): qty 2
  Filled 2012-01-01: qty 1
  Filled 2012-01-01 (×3): qty 2

## 2012-01-01 MED ORDER — PROMETHAZINE HCL 25 MG PO TABS
25.0000 mg | ORAL_TABLET | Freq: Four times a day (QID) | ORAL | Status: DC | PRN
  Administered 2012-01-02 – 2012-01-04 (×2): 25 mg via ORAL
  Filled 2012-01-01 (×2): qty 1

## 2012-01-01 MED ORDER — CALCIUM CARBONATE-VITAMIN D 250-125 MG-UNIT PO TABS: 1.0000 | ORAL_TABLET | Freq: Every day | ORAL | Status: DC

## 2012-01-01 MED ORDER — PANTOPRAZOLE SODIUM 40 MG PO TBEC
40.0000 mg | DELAYED_RELEASE_TABLET | Freq: Every day | ORAL | Status: DC
  Administered 2012-01-02 – 2012-01-04 (×3): 40 mg via ORAL
  Filled 2012-01-01 (×4): qty 1

## 2012-01-01 MED ORDER — LORAZEPAM 0.5 MG PO TABS
0.5000 mg | ORAL_TABLET | Freq: Three times a day (TID) | ORAL | Status: DC
  Administered 2012-01-01 – 2012-01-02 (×4): 0.5 mg via ORAL
  Filled 2012-01-01 (×4): qty 1

## 2012-01-01 MED ORDER — ZOLPIDEM TARTRATE 5 MG PO TABS
5.0000 mg | ORAL_TABLET | Freq: Every evening | ORAL | Status: DC | PRN
  Administered 2012-01-01 (×2): 5 mg via ORAL
  Filled 2012-01-01 (×2): qty 1

## 2012-01-01 MED ORDER — NORGESTIMATE-ETH ESTRADIOL 0.25-35 MG-MCG PO TABS: 1.0000 | ORAL_TABLET | Freq: Every day | ORAL | Status: DC
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
## 2012-01-01 NOTE — Progress Notes (Signed)
Admission Note:  Patient presented to [facility] from Dr. Sheela Stack office.  Stated she was in [facility] one week in April and one week in May 2013.   Has chronic dumping from bypass surgery in 2006.  Stated she has constant nausea/vomiting/diarrhea.  Stated she does not use tobacco, alcohol or drugs.  Denied having diabetes.  History of 3 right knee surgeries to repair torn ligament, last knee surgery in 2010.  Does not eat fiber, can eat processed foods.  No fresh fruits/vegetables.  Does have firearms in her house which are locked up and her husband has the key.  Stated she has had thoughts of hurting herself, but not today.   Denied SI and HI today.   Denied A/V hallucinations.  Lower legs has chigger bites which she has scratched.  Tato right buttock.   3 red areas on left breast, which she stated she has had small skin cancers removed this past year.  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  Upon admission BP sitting was 157/103  P 115, R18, T98.5, 97% Room air.  Standing BP 160/130, P117.   Manual BP 158/108 Pulse 111 sitting.  Doctor was informed and medication order written. Patient was offered meal and oriented to unit. Patient has been cooperative and pleasant.

## 2012-01-01 NOTE — H&P (Deleted)
[name] is an 31 y.o. female.   Chief Complaint:  Feels hopeless and wants to be admitted. HPI: Was seeing her provider Dr.Arfeen and confided above.For further detail splease see his ofice note from today and PAA.  Past Medical History  Diagnosis Date  . HTN (hypertension)   . Fatty liver   . GERD (gastroesophageal reflux disease)   . Hypercholesterolemia   . Anastomotic ulcer     multiple  . S/P endoscopy     last done Oct 2010, multiple, usually emergent due to anastomotic strictures  . S/P colonoscopy 08/2010    normal.  . Dumping syndrome   . Blood clot due to device, implant, or graft     PICC Line    Past Surgical History  Procedure Date  . Gastric bypass 2006    Dr. Lily Wong  . Knee surgery     x3  . Tonsilectomy, adenoidectomy, bilateral myringotomy and tubes     as a child  . Left breast surgery     benign  . Cholecystectomy   . Tonsillectomy     Family History  Problem Relation Age of Onset  . Diabetes Mother   . Hypertension Mother   . Hyperlipidemia Mother   . Hypertension Father   . Diabetes Father   . Depression Father   . Depression Sister   . Depression Brother    Social History:  reports that she has never smoked. She has never used smokeless tobacco. She reports that she does not drink alcohol or use illicit drugs.  Allergies:  Allergies  Allergen Reactions  . Cephalexin   . Cephalexin   . Codeine   . Skin Comfort (Alum Sulfate-Ca Acetate)     Medications Prior to Admission  Medication Sig Dispense Refill  . Calcium Carbonate-Vitamin D (CALCIUM + D PO) Take 600 mg by mouth daily.        . colesevelam (WELCHOL) 625 MG tablet Take 1,875 mg by mouth 2 (two) times daily with a meal.        . DEXILANT 60 MG capsule       . Ferrous Sulfate (IRON SUPPLEMENT PO) Take by mouth.        . gabapentin (NEURONTIN) 300 MG capsule       . LORazepam (ATIVAN) 0.5 MG tablet Take 0.5 mg by mouth every 8 (eight) hours.        . Multiple  Vitamin (MULTIVITAMIN PO) Take by mouth daily.        [name] Triphasic (ORTHO TRI-CYCLEN LO PO) Take by mouth. Patient does not recall dosage.      . olanzapine-FLUoxetine (SYMBYAX) 6-50 MG per capsule Take 1 capsule by mouth every evening.  30 capsule  0  . ondansetron (ZOFRAN) 8 MG tablet Take by mouth every 8 (eight) hours as needed.        . promethazine (PHENERGAN) 25 MG tablet Take 25 mg by mouth every 6 (six) hours as needed.        . SUMAtriptan (IMITREX) 50 MG tablet       . TiZANidine HCl (ZANAFLEX PO) Take by mouth.      . verapamil (CALAN) 80 MG tablet       . zolpidem (AMBIEN CR) 12.5 MG CR tablet Take 1 tablet (12.5 mg total) by mouth at bedtime as needed for sleep.  30 tablet  1  . BESIVANCE 0.6 % SUSP       . LOTEMAX 0.5 % ophthalmic suspension       .  SPRINTEC 28 0.25-35 MG-MCG tablet         No results found for this or any previous visit (from the past 48 hour(s)). No results found.  Review of Systems  Constitutional: Positive for malaise/fatigue and diaphoresis.  Cardiovascular: Positive for chest pain and palpitations.  Gastrointestinal: Positive for nausea and vomiting.       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   Musculoskeletal: Negative.   Neurological: Positive for weakness.  Endo/Heme/Allergies: Negative.   Psychiatric/Behavioral: Positive for depression and suicidal ideas. The patient has insomnia.     Blood pressure 158/108, pulse 111, temperature 98.5 F (36.9 C), temperature source Oral, resp. rate 18, height 5\' 4"  (1.626 m), weight 101.152 kg (223 lb), last menstrual period 12/25/2011. Physical Exam  Constitutional: She is oriented to person, place, and time. She appears well-developed and well-nourished.  HENT:  Head: Normocephalic and atraumatic.  Right Ear: External ear normal.  Left Ear: External ear normal.  Mouth/Throat: Oropharynx is clear and moist.  Eyes: Conjunctivae and EOM are normal. Pupils  are equal, round, and reactive to light.  Neck: Normal range of motion. Neck supple.  Cardiovascular: Regular rhythm and normal heart sounds.        Rate is increased   Respiratory: Effort normal and breath sounds normal.  GI: Soft. Bowel sounds are normal.  Musculoskeletal: Normal range of motion.  Neurological: She is alert and oriented to person, place, and time. She has normal reflexes.  Skin: Skin is warm and dry.       3 small ares L breast where skin cancers removed last year.   Psychiatric: She has a normal mood and affect. Her behavior is normal. Judgment and thought content normal.     Assessment/Plan Admit for safety & stabilization Adjust meds as indicated    [name],[name]. 01/01/2012, 7:13 PM

## 2012-01-01 NOTE — Progress Notes (Signed)
[facility] Group Notes:  (Counselor/Nursing/MHT/Case Management/Adjunct)  01/01/2012 11:27 PM  Type of Therapy:  Psychoeducational Skills  Participation Level:  Active  Participation Quality:  Appropriate  Affect:  Appropriate  Cognitive:  Appropriate  Insight:  Good  Engagement in Group:  Good  Engagement in Therapy:  Good  Modes of Intervention:  Problem-solving  Summary of Progress/Problems: Pt attended wrap up group tonight and today was her first day here. She is here to have her medication adjusted and pain from a gastric bypass surgery. She stated she is on 30 different medications and basically sits on her couch all day due to the medication. She is also depressed.   [name] L 01/01/2012, 11:27 PM

## 2012-01-01 NOTE — BH Assessment (Signed)
PT SAW HER PSYCHIATRIST, DR ARFEEN THIS AM AND REPORTED INCREASED DEPRESSION WITH SUICIDAL THOUGHTS AND NO PLAN. PT IS UNABLE TO CONTRACT FOR SAFETY. PT REPORTS NO CURRENT H/I NOR PSYCHOSIS. PT REPORTS BEING COMPLIANT WITH HER MEDICATIONS BUT FEELING VERY DEPRESSED WITH POOR CONCENTRATION, HOPELESS AND HELPLESS FEELINGS AND ONLY GETTING ABOUT 1-2 HOURS OF SLEEP FOR THE PAST FEW WEEKS. PT REPORTS DAILY CRYING SPELLS AND INCREASED BLOOD PRESSURE DUE TO STRESS AND INCREASED AND ANXIETY. PT DENIES ALCOHOL OR DRUG ABUSE.  DR READLING ACCEPTS PT TO DR Dan Humphreys.

## 2012-01-02 DIAGNOSIS — F429 Obsessive-compulsive disorder, unspecified: Secondary | ICD-10-CM

## 2012-01-02 DIAGNOSIS — F332 Major depressive disorder, recurrent severe without psychotic features: Principal | ICD-10-CM

## 2012-01-02 LAB — SEDIMENTATION RATE: Sed Rate: 7 mm/hr (ref 0–22)

## 2012-01-02 LAB — TSH: TSH: 4.265 u[IU]/mL (ref 0.350–4.500)

## 2012-01-02 LAB — GLUCOSE, CAPILLARY: Glucose-Capillary: 297 mg/dL — ABNORMAL HIGH (ref 70–99)

## 2012-01-02 MED ORDER — ACETAMINOPHEN 500 MG PO TABS
1000.0000 mg | ORAL_TABLET | Freq: Four times a day (QID) | ORAL | Status: DC | PRN
  Administered 2012-01-03 – 2012-01-04 (×3): 1000 mg via ORAL
  Filled 2012-01-02 (×2): qty 2

## 2012-01-02 MED ORDER — GABAPENTIN 300 MG PO CAPS
600.0000 mg | ORAL_CAPSULE | Freq: Four times a day (QID) | ORAL | Status: DC
  Administered 2012-01-02 – 2012-01-04 (×7): 600 mg via ORAL
  Filled 2012-01-02 (×14): qty 2

## 2012-01-02 MED ORDER — FLUOXETINE HCL 20 MG PO CAPS
20.0000 mg | ORAL_CAPSULE | Freq: Once | ORAL | Status: AC
  Administered 2012-01-02: 20 mg via ORAL
  Filled 2012-01-02: qty 1

## 2012-01-02 MED ORDER — OLANZAPINE-FLUOXETINE HCL 6-50 MG PO CAPS
1.0000 | ORAL_CAPSULE | Freq: Every evening | ORAL | Status: DC
  Filled 2012-01-02: qty 1

## 2012-01-02 MED ORDER — OLANZAPINE-FLUOXETINE HCL 6-25 MG PO CAPS
1.0000 | ORAL_CAPSULE | Freq: Once | ORAL | Status: AC
  Administered 2012-01-02: 1 via ORAL
  Filled 2012-01-02: qty 1

## 2012-01-02 MED ORDER — CLOMIPRAMINE HCL 25 MG PO CAPS
50.0000 mg | ORAL_CAPSULE | Freq: Every day | ORAL | Status: DC
Start: 2012-01-02 — End: 2012-01-04
  Administered 2012-01-02 – 2012-01-03 (×2): 50 mg via ORAL
  Filled 2012-01-02: qty 1
  Filled 2012-01-02: qty 2
  Filled 2012-01-02: qty 1
  Filled 2012-01-02 (×4): qty 2

## 2012-01-02 MED ORDER — SUMATRIPTAN SUCCINATE 100 MG PO TABS: 100.0000 mg | ORAL_TABLET | ORAL | Status: DC | PRN

## 2012-01-02 MED ORDER — HYDROXYZINE HCL 25 MG PO TABS
25.0000 mg | ORAL_TABLET | Freq: Three times a day (TID) | ORAL | Status: DC | PRN
  Administered 2012-01-02 – 2012-01-04 (×6): 25 mg via ORAL
  Filled 2012-01-02: qty 1

## 2012-01-02 MED ORDER — CALCIUM CARBONATE-VITAMIN D 250-125 MG-UNIT PO TABS
1.0000 | ORAL_TABLET | Freq: Every day | ORAL | Status: DC
  Administered 2012-01-02 – 2012-01-04 (×3): 1 via ORAL
  Filled 2012-01-02 (×6): qty 1

## 2012-01-02 NOTE — H&P (Signed)
Medical/psychiatric screening examination/treatment/procedure(s) were performed by non-physician practitioner and as supervising physician I was immediately available for consultation/collaboration.  I have seen and examined this patient and agree the major elements of this evaluation.  

## 2012-01-02 NOTE — Progress Notes (Signed)
01/02/2012         Time: 1500      Group Topic/Focus: The focus of this group is on promoting emotional and psychological well-being through the process of creative expression, relaxation, socialization, fun and enjoyment.  Participation Level: Did not attend  Participation Quality: Not Applicable  Affect: Not Applicable  Cognitive: Not Applicable   Additional Comments: Patient waiting to see doctor.   Dessire Grimes 01/02/2012 4:10 PM

## 2012-01-02 NOTE — Progress Notes (Signed)
[facility] Group Notes:  (Counselor/Nursing/MHT/Case Management/Adjunct)  01/02/2012 1:41 PM  Type of Therapy:  Psychoeducational Skills  Participation Level:  Minimal  Participation Quality:  Drowsy  Affect:  Appropriate and Blunted  Cognitive:  Appropriate and Oriented  Insight:  Good  Engagement in Group:  Limited  Engagement in Therapy:  n/a  Modes of Intervention:  Activity, Education, Problem-solving, Socialization and Support  Summary of Progress/Problems: Verina attended Psychoeducational group on labels. Rickelle participated in an activity labeling self and peers and choose to label herself as a Producer, television/film/video for the activity. Tyja was drowsy and fell asleep but was easily woken while group discussed what labels are, how we use them, and listed positive and negative labels they have used or been called. Scarlett was given a homework assignment to list 10 words she has been labeled to find the reality of the situation/label.    Wandra Scot 01/02/2012, 1:41 PM

## 2012-01-02 NOTE — Progress Notes (Signed)
D: Pt in bed resting with eyes closed. Respirations even and unlabored. Pt appears to be in no signs of distress at this time. A: Q15min checks remains for this pt. R: Pt remains safe at this time.   

## 2012-01-02 NOTE — Tx Team (Signed)
Interdisciplinary Treatment Plan Update (Adult)  Date:  01/02/2012  Time Reviewed:  11:12 AM   Progress in Treatment: Attending groups: Yes Participating in groups:  Yes Taking medication as prescribed: Yes Tolerating medication:  Yes Family/Significant other contact made:  Counselor will make appropriate contact Patient understands diagnosis:  Yes Discussing patient identified problems/goals with staff:  Yes Medical problems stabilized or resolved:  Yes Denies suicidal/homicidal ideation: Yes Issues/concerns per patient self-inventory:  None identified Other: N/A  New problem(s) identified: None Identified  Reason for Continuation of Hospitalization: Anxiety Depression Medication stabilization Suicidal ideation  Interventions implemented related to continuation of hospitalization: mood stabilization, medication monitoring and adjustment, group therapy and psycho education, safety checks q 15 mins  Additional comments: N/A  Estimated length of stay: 3-5 days  Discharge Plan: SW will refer pt back to Dr. Lolly Mustache and Florencia Reasons for medication management and therapy.    New goal(s): N/A  Review of initial/current patient goals per problem list:    1.  Goal(s): Reduce depressive symptoms  Met:  No  Target date: by discharge  As evidenced by: Reducing depression from a 10 to a 3 as reported by pt.   2.  Goal (s): Reduce/Eliminate suicidal ideation  Met:  No  Target date: by discharge  As evidenced by: pt reporting no SI.    3.  Goal(s): Reduce anxiety symptoms  Met:  No  Target date: by discharge  As evidenced by: Reduce anxiety from a 10 to a 3 as reported by pt.    Attendees: Patient:  [name] 01/02/2012 11:12 AM   Family:     Physician:  Orson Aloe, MD  01/02/2012  11:12 AM   Nursing:   Berneice Heinrich, RN 01/02/2012 11:12 AM   Case Manager:  Reyes Ivan, LCSWA 01/02/2012  11:12 AM   Counselor:  Angus Palms, LCSW 01/02/2012  11:12 AM   Other:     Other:     Other:     Other:      Scribe for Treatment Team:   Carmina Miller, 01/02/2012 , 11:12 AM

## 2012-01-02 NOTE — Progress Notes (Signed)
D:  Pt about on the unit today.  She presents with a flat affect/depressed mood.  She said she slept poorly last pm.  She also stated that she did not eat much today She rates her depression as a 5/10 and hopelessness as a 8/10.  She denies SI/HI  She complained of feeling sweaty and "light-headed"  VS taken with BP of 100/68.  Her blood sugar was checked and noted at 297.  She requested her blood sugar be checked after lunch and it was reported at 211.  She is very needy of staff and asks about getting all of her medications straight.  She is attending groups.  A:  Given support and encouragement through the day.  Given prn med for nausea with no stated relief .  She says she has had problems with nausea for years.  R:  Receptive to staff.  Remains on q 15 minute checks for safety.  Will continue to monitor.

## 2012-01-02 NOTE — Progress Notes (Signed)
Pt attended discharge planning group and actively participated in group.  SW provided pt with today's workbook.  Pt presents with calm mood and affect.  Pt was open with sharing reason for entering the hospital.  Pt states that she doesn't feel her medication is working.  Pt states that she is depressed and feels like nothing is working.  Pt states that she has chronic illness for the past 7 years.  Pt states that she lives with her husband in [place] and has transportation home.  Pt states that her family is also supportive.  Pt states that she was seeing Dr. Lolly Mustache and Florencia Reasons in [facility].  SW will refer pt back to them.  Pt rates depression and anxiety at an 8 today.  Pt denies SI. No further needs voiced by pt at this time.  Safety planning and suicide prevention discussed.  Pt participated in discussion and acknowledged an understanding of the information provided.       Reyes Ivan, LCSWA 01/02/2012  10:40 AM

## 2012-01-02 NOTE — Progress Notes (Signed)
[facility] MD Progress Note  01/02/2012 4:14 PM  Diagnosis:   Axis I: Major Depression, Recurrent severe Axis II: Deferred Axis III:  Past Medical History  Diagnosis Date  . HTN (hypertension)   . Fatty liver   . GERD (gastroesophageal reflux disease)   . Hypercholesterolemia   . Anastomotic ulcer     multiple  . S/P endoscopy     last done Oct 2010, multiple, usually emergent due to anastomotic strictures  . S/P colonoscopy 08/2010    normal.  . Dumping syndrome   . Blood clot due to device, implant, or graft     PICC Line   Axis IV: other psychosocial or environmental problems and problems related to social environment Axis V: 21-30 behavior considerably influenced by delusions or hallucinations OR serious impairment in judgment, communication OR inability to function in almost all areas  ADL's:  Intact  Sleep: Poor  Appetite:  Poor  Suicidal Ideation:  Plan:  No Intent:  No Means:  No Homicidal Ideation:  Plan:  No Intent:  No Means:  No  AEB (as evidenced by):  Mental Status Examination/Evaluation: Objective:  Appearance: Casual  Eye Contact::  Good  Speech:  Clear and Coherent  Volume:  Normal  Mood:  Anxious, Depressed, Hopeless, Irritable and Worthless  Affect:  Congruent  Thought Process:  Coherent  Orientation:  Full  Thought Content:  WDL  Suicidal Thoughts:  Yes.  without intent/plan  Homicidal Thoughts:  No  Memory:  Immediate;   Fair Recent;   Fair Remote;   Fair  Judgement:  Fair  Insight:  Fair  Psychomotor Activity:  Normal  Concentration:  Fair  Recall:  Fair  Akathisia:  No  Handed:  Right  AIMS (if indicated):     Assets:  Communication Skills Desire for Improvement  Sleep:  Number of Hours: 4.5    Vital Signs:Blood pressure 138/85, pulse 112, temperature 97.9 F (36.6 C), temperature source Oral, resp. rate 16, height 5\' 4"  (1.626 m), weight 101.152 kg (223 lb), last menstrual period 12/25/2011. Current Medications: Current  Facility-Administered Medications  Medication Dose Route Frequency Provider Last Rate Last Dose  . acetaminophen (TYLENOL) tablet 1,000 mg  1,000 mg Oral Q6H PRN Mike Craze, MD      . alum & mag hydroxide-simeth (MAALOX/MYLANTA) 200-200-20 MG/5ML suspension 30 mL  30 mL Oral Q4H PRN [name]. Adams, PA      . calcium-vitamin D (OSCAL WITH D) 250-125 MG-UNIT per tablet 1 tablet  1 tablet Oral Daily Mike Craze, MD   1 tablet at 01/02/12 [phone]  . clomiPRAMINE (ANAFRANIL) capsule 50 mg  50 mg Oral QHS Mike Craze, MD      . colesevelam [facility]) tablet 1,875 mg  1,875 mg Oral BID WC [name]. Adams, PA   1,875 mg at 01/02/12 0815  . olanzapine-FLUoxetine (SYMBYAX) 6-25 MG per capsule 1 capsule  1 capsule Oral ONCE-1800 Mike Craze, MD       And  . FLUoxetine (PROZAC) capsule 20 mg  20 mg Oral ONCE-1800 Mike Craze, MD      . gabapentin (NEURONTIN) capsule 600 mg  600 mg Oral QID Mike Craze, MD      . hydrOXYzine (ATARAX/VISTARIL) tablet 25 mg  25 mg Oral TID PRN Mike Craze, MD      . magnesium hydroxide (MILK OF MAGNESIA) suspension 30 mL  30 mL Oral Daily PRN [name]. Pernell Dupre, PA      .  Norgestimate-Ethinyl Estradiol Triphasic (ORTHO TRI-CYCLEN LO,TRI-LO-SPRINTEC) 0.18/0.215/0.25 MG-25 MCG tablet 1 tablet  1 tablet Oral Daily [name]. Adams, PA   1 tablet at 01/02/12 0800  . olanzapine-FLUoxetine (SYMBYAX) 6-50 MG per capsule 1 capsule  1 capsule Oral QPM Mike Craze, MD      . ondansetron [employer]) tablet 8 mg  8 mg Oral Q8H PRN [name]. Adams, PA   8 mg at 01/02/12 0829  . pantoprazole (PROTONIX) EC tablet 40 mg  40 mg Oral Q1200 [name]. Adams, PA   40 mg at 01/02/12 1154  . promethazine (PHENERGAN) tablet 25 mg  25 mg Oral Q6H PRN [name]. Adams, PA   25 mg at 01/02/12 5284  . SUMAtriptan (IMITREX) tablet 100 mg  100 mg Oral Q2H PRN Mike Craze, MD      . SUMAtriptan (IMITREX) tablet 50 mg  50 mg Oral Once [name]. Adams, PA   50 mg at 01/01/12 1832  . tiZANidine  (ZANAFLEX) tablet 4 mg  4 mg Oral TID [name]. Adams, PA   4 mg at 01/02/12 1154  . verapamil (CALAN) tablet 80 mg  80 mg Oral QPC breakfast Sanjuana Kava, NP   80 mg at 01/02/12 1324  . DISCONTD: acetaminophen (TYLENOL) tablet 650 mg  650 mg Oral Q6H PRN [name]. Adams, PA   650 mg at 01/02/12 1313  . DISCONTD: calcium-vitamin D (OSCAL WITH D) 250-125 MG-UNIT per tablet 1 tablet  1 tablet Oral Daily [name]. Adams, PA      . DISCONTD: calcium-vitamin D (OSCAL WITH D) 500-200 MG-UNIT per tablet 0.5 tablet  0.5 tablet Oral Daily Mike Craze, MD   0.5 tablet at 01/01/12 1831  . DISCONTD: gabapentin (NEURONTIN) capsule 300 mg  300 mg Oral BID Sanjuana Kava, NP   300 mg at 01/02/12 0816  . DISCONTD: LORazepam (ATIVAN) tablet 0.5 mg  0.5 mg Oral Q8H [name]. Adams, PA   0.5 mg at 01/02/12 1314  . DISCONTD: norgestimate-ethinyl estradiol (ORTHO-CYCLEN,SPRINTEC,PREVIFEM) 0.25-35 MG-MCG tablet 1 tablet  1 tablet Oral Daily [name]. Adams, PA      . DISCONTD: olanzapine-FLUoxetine (SYMBYAX) 6-50 MG per capsule 1 capsule  1 capsule Oral QPM [name]. Adams, PA      . DISCONTD: zolpidem (AMBIEN) tablet 5 mg  5 mg Oral QHS PRN [name]. Adams, PA      . DISCONTD: zolpidem (AMBIEN) tablet 5 mg  5 mg Oral QHS PRN,MR X 1 [name]. Pernell Dupre, PA   5 mg at 01/01/12 2301    Lab Results:  Results for orders placed during the hospital encounter of 01/01/12 (from the past 48 hour(s))  URINALYSIS, ROUTINE W REFLEX MICROSCOPIC     Status: Abnormal   Collection Time   01/01/12  7:22 PM      Component Value Range Comment   Color, Urine YELLOW  YELLOW    APPearance CLEAR  CLEAR    Specific Gravity, Urine 1.011  1.005 - 1.030    pH 6.0  5.0 - 8.0    Glucose, UA NEGATIVE  NEGATIVE mg/dL    Hgb urine dipstick NEGATIVE  NEGATIVE    Bilirubin Urine NEGATIVE  NEGATIVE    Ketones, ur NEGATIVE  NEGATIVE mg/dL    Protein, ur NEGATIVE  NEGATIVE mg/dL    Urobilinogen, UA 0.2  0.0 - 1.0 mg/dL    Nitrite NEGATIVE  NEGATIVE     Leukocytes, UA SMALL (*) NEGATIVE  PREGNANCY, URINE     Status: Normal   Collection Time   01/01/12  7:22 PM      Component Value Range Comment   Preg Test, Ur NEGATIVE  NEGATIVE   URINE RAPID DRUG SCREEN (HOSP PERFORMED)     Status: Normal   Collection Time   01/01/12  7:22 PM      Component Value Range Comment   Opiates NONE DETECTED  NONE DETECTED    Cocaine NONE DETECTED  NONE DETECTED    Benzodiazepines NONE DETECTED  NONE DETECTED    Amphetamines NONE DETECTED  NONE DETECTED    Tetrahydrocannabinol NONE DETECTED  NONE DETECTED    Barbiturates NONE DETECTED  NONE DETECTED   URINE MICROSCOPIC-ADD ON     Status: Normal   Collection Time   01/01/12  7:22 PM      Component Value Range Comment   WBC, UA 0-2  <3 WBC/hpf    Bacteria, UA RARE  RARE   CBC     Status: Abnormal   Collection Time   01/01/12  8:06 PM      Component Value Range Comment   WBC 10.6 (*) 4.0 - 10.5 K/uL    RBC 4.70  3.87 - 5.11 MIL/uL    Hemoglobin 14.1  12.0 - 15.0 g/dL    HCT 16.1  09.6 - 04.5 %    MCV 90.4  78.0 - 100.0 fL    MCH 30.0  26.0 - 34.0 pg    MCHC 33.2  30.0 - 36.0 g/dL    RDW 40.9  81.1 - 91.4 %    Platelets 265  150 - 400 K/uL   COMPREHENSIVE METABOLIC PANEL     Status: Abnormal   Collection Time   01/01/12  8:06 PM      Component Value Range Comment   Sodium 136  135 - 145 mEq/L    Potassium 3.8  3.5 - 5.1 mEq/L    Chloride 102  96 - 112 mEq/L    CO2 19  19 - 32 mEq/L    Glucose, Bld 96  70 - 99 mg/dL    BUN 8  6 - 23 mg/dL    Creatinine, Ser 7.82  0.50 - 1.10 mg/dL    Calcium 9.8  8.4 - 95.6 mg/dL    Total Protein 7.1  6.0 - 8.3 g/dL    Albumin 3.6  3.5 - 5.2 g/dL    AST 31  0 - 37 U/L    ALT 38 (*) 0 - 35 U/L    Alkaline Phosphatase 70  39 - 117 U/L    Total Bilirubin 0.3  0.3 - 1.2 mg/dL    GFR calc non Af Amer >90  >90 mL/min    GFR calc Af Amer >90  >90 mL/min   TSH     Status: Normal   Collection Time   01/01/12  8:06 PM      Component Value Range Comment   TSH  4.265  0.350 - 4.500 uIU/mL   GLUCOSE, CAPILLARY     Status: Abnormal   Collection Time   01/02/12  9:56 AM      Component Value Range Comment   Glucose-Capillary 297 (*) 70 - 99 mg/dL   GLUCOSE, CAPILLARY     Status: Abnormal   Collection Time   01/02/12  1:02 PM      Component Value Range Comment   Glucose-Capillary 211 (*) 70 - 99 mg/dL     Physical  Findings: AIMS: Facial and Oral Movements Muscles of Facial Expression: None, normal Lips and Perioral Area: None, normal Jaw: None, normal Tongue: None, normal,Extremity Movements Upper (arms, wrists, hands, fingers): None, normal Lower (legs, knees, ankles, toes): None, normal, Trunk Movements Neck, shoulders, hips: None, normal, Overall Severity Severity of abnormal movements (highest score from questions above): None, normal Incapacitation due to abnormal movements: None, normal Patient's awareness of abnormal movements (rate only patient's report): No Awareness, Dental Status Current problems with teeth and/or dentures?: No Does patient usually wear dentures?: No  CIWA:  CIWA-Ar Total: 12  COWS:  COWS Total Score: 6   Treatment Plan Summary: Daily contact with patient to assess and evaluate symptoms and progress in treatment Medication management  Plan: Admit, increase Prozac to 50 mg, add Anafranil, and increase Neurontin.  Add Vistaril for anxiety.  Continue Imitrex for migraine.  Consider adding Elavil for back pain. Along with antiinflammatory meds.  Discussed the risks, benefits, and probable clinical course with and without treatment.  Pt is agreeable to the current course of treatment.  Michele Judy 01/02/2012, 4:14 PM

## 2012-01-02 NOTE — Progress Notes (Signed)
Psychoeducational Group Note  Date:  01/02/2012 Time:  2000  Group Topic/Focus:  Wrap-Up Group:   The focus of this group is to help patients review their daily goal of treatment and discuss progress on daily workbooks.  Participation Level:  Active  Participation Quality:  Attentive  Affect:  Appropriate  Cognitive:  Appropriate  Insight:  Good  Engagement in Group:  Good  Additional Comments:  Patient attended and participated in wrap-up group this evening.  [name], [name] 01/02/2012, 11:23 PM

## 2012-01-02 NOTE — Progress Notes (Signed)
Psychoeducational Group Note  Date:  01/02/2012 Time:  1100  Group Topic/Focus:  Personal Choices and Values:   The focus of this group is to help patients assess and explore the importance of values in their lives, how their values affect their decisions, how they express their values and what opposes their expression.  Participation Level:  Minimal  Participation Quality:  Appropriate and Sharing  Affect:  Appropriate  Cognitive:  Appropriate  Insight:  Good  Engagement in Group:  Good  Additional Comments:  Pt was removed from group at the beginning for a consult with PA. Pt returned and was willing to share two of her top values which were Being free from pain because of her chronic back pain and the other is being emotionally stable. Pt stated that it is hard for her to deal with the pain.   Sharyn Lull 01/02/2012, 2:38 PM

## 2012-01-03 DIAGNOSIS — F429 Obsessive-compulsive disorder, unspecified: Secondary | ICD-10-CM | POA: Diagnosis present

## 2012-01-03 LAB — TSH: TSH: 2.254 u[IU]/mL (ref 0.350–4.500)

## 2012-01-03 LAB — URINE MICROSCOPIC-ADD ON

## 2012-01-03 LAB — URINALYSIS, ROUTINE W REFLEX MICROSCOPIC
Glucose, UA: NEGATIVE mg/dL
Hgb urine dipstick: NEGATIVE
Ketones, ur: NEGATIVE mg/dL
Nitrite: NEGATIVE
Specific Gravity, Urine: 1.016 (ref 1.005–1.030)
Urobilinogen, UA: 0.2 mg/dL (ref 0.0–1.0)

## 2012-01-03 LAB — HEMOGLOBIN A1C: Hgb A1c MFr Bld: 5.8 % — ABNORMAL HIGH (ref <5.7)

## 2012-01-03 LAB — T3, FREE: T3, Free: 3 pg/mL (ref 2.3–4.2)

## 2012-01-03 MED ORDER — ADULT MULTIVITAMIN W/MINERALS CH: 1.0000 | ORAL_TABLET | Freq: Every day | ORAL | Status: DC

## 2012-01-03 MED ORDER — FERROUS SULFATE 325 (65 FE) MG PO TABS: 325.0000 mg | ORAL_TABLET | Freq: Every day | ORAL | Status: DC

## 2012-01-03 MED ORDER — ADULT MULTIVITAMIN W/MINERALS CH
1.0000 | ORAL_TABLET | Freq: Every day | ORAL | Status: DC
  Administered 2012-01-03 – 2012-01-04 (×2): 1 via ORAL
  Filled 2012-01-03 (×5): qty 1

## 2012-01-03 MED ORDER — NORGESTIM-ETH ESTRAD TRIPHASIC 0.18/0.215/0.25 MG-35 MCG PO TABS: 1.0000 | ORAL_TABLET | Freq: Every day | ORAL | Status: DC

## 2012-01-03 MED ORDER — OXYBUTYNIN CHLORIDE ER 5 MG PO TB24
5.0000 mg | ORAL_TABLET | Freq: Every day | ORAL | Status: DC
  Administered 2012-01-03: 5 mg via ORAL
  Filled 2012-01-03 (×3): qty 1

## 2012-01-03 MED ORDER — DEXLANSOPRAZOLE 60 MG PO CPDR: 60.0000 mg | DELAYED_RELEASE_CAPSULE | Freq: Every day | ORAL | Status: DC

## 2012-01-03 MED ORDER — FLUOXETINE HCL 20 MG PO CAPS
20.0000 mg | ORAL_CAPSULE | Freq: Once | ORAL | Status: AC
  Administered 2012-01-03: 20 mg via ORAL
  Filled 2012-01-03: qty 1

## 2012-01-03 MED ORDER — VITAMIN B-12 1000 MCG PO TABS: 1000.0000 ug | ORAL_TABLET | Freq: Every day | ORAL | Status: DC

## 2012-01-03 MED ORDER — SUMATRIPTAN SUCCINATE 50 MG PO TABS: 100.0000 mg | ORAL_TABLET | ORAL | Status: DC | PRN

## 2012-01-03 MED ORDER — ONDANSETRON HCL 8 MG PO TABS: 8.0000 mg | ORAL_TABLET | Freq: Three times a day (TID) | ORAL | Status: DC | PRN

## 2012-01-03 MED ORDER — OLANZAPINE-FLUOXETINE HCL 6-25 MG PO CAPS
1.0000 | ORAL_CAPSULE | Freq: Once | ORAL | Status: AC
  Administered 2012-01-03: 1 via ORAL
  Filled 2012-01-03: qty 1

## 2012-01-03 MED ORDER — BOOST / RESOURCE BREEZE PO LIQD
1.0000 | Freq: Three times a day (TID) | ORAL | Status: DC
  Administered 2012-01-03 – 2012-01-04 (×2): 1 via ORAL
  Filled 2012-01-03 (×8): qty 1

## 2012-01-03 MED ORDER — GABAPENTIN 300 MG PO CAPS: 300.0000 mg | ORAL_CAPSULE | Freq: Three times a day (TID) | ORAL | Status: DC

## 2012-01-03 MED ORDER — OLANZAPINE-FLUOXETINE HCL 6-50 MG PO CAPS
1.0000 | ORAL_CAPSULE | Freq: Every evening | ORAL | Status: DC
  Filled 2012-01-03 (×2): qty 1

## 2012-01-03 MED ORDER — DICLOFENAC SODIUM 1 % TD GEL: 1.0000 "application " | Freq: Every day | TRANSDERMAL | Status: DC

## 2012-01-03 MED ORDER — COLESEVELAM HCL 625 MG PO TABS: 1875.0000 mg | ORAL_TABLET | Freq: Two times a day (BID) | ORAL | Status: DC

## 2012-01-03 MED ORDER — CLOMIPRAMINE HCL 25 MG PO CAPS: 50.0000 mg | ORAL_CAPSULE | Freq: Every day | ORAL | Status: DC

## 2012-01-03 MED ORDER — CALCIUM CARBONATE-VITAMIN D 250-125 MG-UNIT PO TABS: 1.0000 | ORAL_TABLET | Freq: Every day | ORAL | Status: DC

## 2012-01-03 MED ORDER — TIZANIDINE HCL 6 MG PO CAPS: 6.0000 mg | ORAL_CAPSULE | Freq: Three times a day (TID) | ORAL | Status: DC

## 2012-01-03 MED ORDER — OLANZAPINE-FLUOXETINE HCL 6-50 MG PO CAPS: 1.0000 | ORAL_CAPSULE | Freq: Every evening | ORAL | Status: AC

## 2012-01-03 MED ORDER — VERAPAMIL HCL 80 MG PO TABS: 80.0000 mg | ORAL_TABLET | Freq: Every day | ORAL | Status: DC

## 2012-01-03 NOTE — Progress Notes (Signed)
        [facility] Group Notes: (Counselor/Nursing/MHT/Case Management/Adjunct) 01/03/2012   @1 :15pm [employer] [facility]  Type of Therapy:  Group Therapy  Participation Level:  Good  Participation Quality:  Good  Affect:  Appropriate  Cognitive:  Appropriate  Insight:  Good  Engagement in Group:  Good  Engagement in Therapy:  Good  Modes of Intervention:  Support and Exploration  Summary of Progress/Problems: [name]  participated with speaker from [employer] [facility] [place] and expressed interest in programs [facility] offers.   [name] 01/03/2012  4:00 PM
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
## 2012-01-03 NOTE — Progress Notes (Addendum)
Nutrition Brief Note  Patient identified on the Nutrition Risk Report for unintentional weight loss greater than 10 pounds in the past month.   Body mass index is 38.28 kg/(m^2). Pt meets criteria for class II obesity based on current BMI.   - Pt with history of gastric bypass in 2006 with dumping syndrome. Pt reports having daily nausea and vomiting since then, typically 3-4 episodes per day. Pt states she typically has diarrhea 5-7 times/day, however has not had a BM since Monday, which is unusual for her. Pt states she was eating junk food PTA like popcorn, chips, and cookies with little protein intake. Pt states she was not eating meals consistently, just snacking on foods throughout the day. Pt states she was recently admitted at [facility] and enjoyed the [employer] supplement - will order. Per past medical records pt's weight has been stable in the past month.   Intervention: Resource Breeze TID. Bowel regimen per MD. Encouraged healthier eating habits at discharge. Discussed nutrition therapy for diarrhea and dumping syndrome. Pt expressed understanding. Multivitamin 1 tablet PO daily.   Dietitian# [phone]

## 2012-01-03 NOTE — Progress Notes (Signed)
[facility] Adult Inpatient Family/Significant Other Suicide Prevention Education  Suicide Prevention Education:  Education Completed; [name], husband (([phone]), has been identified by the patient as the family member/significant other with whom the patient will be residing, and identified as the person(s) who will aid the patient in the event of a mental health crisis (suicidal ideations/suicide attempt).  With written consent from the patient, the family member/significant other has been provided the following suicide prevention education, prior to the and/or following the discharge of the patient.  The suicide prevention education provided includes the following:  Suicide risk factors  Suicide prevention and interventions  National Suicide Hotline telephone number  [facility] assessment telephone number  [facility] Emergency Assistance 911  [facility] and/or Residential Mobile Crisis Unit telephone number  Request made of family/significant other to:  Remove weapons (e.g., guns, rifles, knives), all items previously/currently identified as safety concern.    Remove drugs/medications (over-the-counter, prescriptions, illicit drugs), all items previously/currently identified as a safety concern.  [name] reports that he does not have any safety concerns and would be in agreement with her discharging tomorrow. He verbalized understanding of suicide prevention information and did not have any questions about that. Although there is a gun in the home, he reports that it is locked away in a gun safe.   [name] 01/03/2012, 3:40 PM

## 2012-01-03 NOTE — Progress Notes (Signed)
  D) Patient pleasant and cooperative upon my assessment. Patient states slept "fair," and  appetite is "improving." Patient rates depression as  4 /10, patient rates hopeless feelings as 6 /10. Patient denies SI/HI, denies A/V hallucinations.   A) Patient offered support and encouragement, patient encouraged to discuss feelings/concerns with staff. Patient verbalized understanding. Patient monitored Q15 minutes for safety. Patient met with MD to discuss today's goals and plan of care.  R) Patient active on unit, attending groups in day room and meals in dining room.  Patient has a plan to "stay occupied, continue new meds and seeing therapist," once she is discharged from [facility].  Patient taking medications as ordered. Will continue to monitor.

## 2012-01-03 NOTE — Progress Notes (Signed)
Psychoeducational Group Note  Date:  01/03/2012 Time:  1000  Group Topic/Focus:  Therapeutic Activity- "Apples to Apples"  Participation Level:  Active  Participation Quality:  Appropriate, Attentive and Sharing  Affect:  Appropriate  Cognitive:  Alert and Appropriate  Insight:  Good  Engagement in Group:  Good  Additional Comments:   Pt participated in therapeutic activities, which was a card game called "Apples to Apples". Pt was appropriate and willing to interact as the game was being conducted.  

## 2012-01-03 NOTE — Progress Notes (Signed)
Patient ID: [name], female   DOB: April 06, 1981, 31 y.o.   MRN: [id number] D: Pt. In dayroom watching TV, affect sad upon approach. She reports depression at "6", and anxiety at "8". A: Staff monitor for safety q68min. Pt. Encouraged to attend groups. R: Pt. Remains safe on the unit. Pt. Attends group.

## 2012-01-03 NOTE — Progress Notes (Signed)
Pt attended discharge planning group and actively participated in group.  SW provided pt with today's workbook.  Pt presents with calm mood and affect.  Pt rates depression at a 4 and anxiety at a 6 today.  Pt denies SI.  Pt reports her mood has improved and feels the medication is helping her.  Pt reports feeling stable to d/c soon.  Pt will follow up with [facility] in [place] for medication management and therapy.  No further needs voiced by pt at this time.    Reyes Ivan, LCSWA 01/03/2012  11:01 AM

## 2012-01-03 NOTE — Progress Notes (Addendum)
[facility] MD Progress Note  01/03/2012 10:32 PM  Diagnosis:   Axis I: Major Depression, Recurrent severe Axis II: Deferred Axis III:  Past Medical History  Diagnosis Date  . HTN (hypertension)   . Fatty liver   . GERD (gastroesophageal reflux disease)   . Hypercholesterolemia   . Anastomotic ulcer     multiple  . S/P endoscopy     last done Oct 2010, multiple, usually emergent due to anastomotic strictures  . S/P colonoscopy 08/2010    normal.  . Dumping syndrome   . Blood clot due to device, implant, or graft     PICC Line   Axis IV: other psychosocial or environmental problems and problems related to social environment Axis V: 51-60 moderate symptoms  ADL's:  Intact  Sleep: Fair  Appetite:  Fair  Suicidal Ideation:  Plan:  No Intent:  No Means:  No Homicidal Ideation:  Plan:  No Intent:  No Means:  No  AEB (as evidenced by):  Mental Status Examination/Evaluation: Objective:  Appearance: Casual  Eye Contact::  Good  Speech:  Clear and Coherent  Volume:  Normal  Mood:  Anxious and Dysphoric  Affect:  Congruent  Thought Process:  Coherent, Intact and Logical  Orientation:  Full  Thought Content:  WDL  Suicidal Thoughts:  No  Homicidal Thoughts:  No  Memory:  Immediate;   Good Recent;   Good Remote;   Good  Judgement:  Good  Insight:  Good  Psychomotor Activity:  Normal  Concentration:  Good  Recall:  Good  Akathisia:  No  Handed:  Right  AIMS (if indicated):     Assets:  Communication Skills Desire for Improvement  Sleep:  Number of Hours: 4.75    ROS: Neuro: no headaches, ataxia, weakness  GI: no N/V/D/cramps/constipation  MS: no weakness, muscle cramps, aches.  GU: pt notes increased urinary frequency and pressure.  She feels that she has a brewing UTI.  Her UA did not show any NItrites.  Will order Ditropan for the dyscomfort.  Vital Signs:Blood pressure 130/86, pulse 79, temperature 98.1 F (36.7 C), temperature source Oral, resp. rate 15,  height 5\' 4"  (1.626 m), weight 101.152 kg (223 lb), last menstrual period 12/25/2011. Current Medications: Current Facility-Administered Medications  Medication Dose Route Frequency Provider Last Rate Last Dose  . acetaminophen (TYLENOL) tablet 1,000 mg  1,000 mg Oral Q6H PRN Mike Craze, MD   1,000 mg at 01/03/12 2153  . alum & mag hydroxide-simeth (MAALOX/MYLANTA) 200-200-20 MG/5ML suspension 30 mL  30 mL Oral Q4H PRN [name]. Adams, PA      . calcium-vitamin D (OSCAL WITH D) 250-125 MG-UNIT per tablet 1 tablet  1 tablet Oral Daily Mike Craze, MD   1 tablet at 01/03/12 0759  . clomiPRAMINE (ANAFRANIL) capsule 50 mg  50 mg Oral QHS Mike Craze, MD   50 mg at 01/03/12 2144  . colesevelam [facility]) tablet 1,875 mg  1,875 mg Oral BID WC [name]. Adams, PA   1,875 mg at 01/03/12 1705  . feeding supplement (RESOURCE BREEZE) liquid 1 Container  1 Container Oral TID BM Lavena Bullion, RD   1 Container at 01/03/12 2142  . olanzapine-FLUoxetine (SYMBYAX) 6-25 MG per capsule 1 capsule  1 capsule Oral ONCE-1800 Mike Craze, MD   1 capsule at 01/03/12 1705   And  . FLUoxetine (PROZAC) capsule 20 mg  20 mg Oral ONCE-1800 Mike Craze, MD   20 mg at 01/03/12  1705  . gabapentin (NEURONTIN) capsule 600 mg  600 mg Oral QID Mike Craze, MD   600 mg at 01/03/12 1951  . hydrOXYzine (ATARAX/VISTARIL) tablet 25 mg  25 mg Oral TID PRN Mike Craze, MD   25 mg at 01/03/12 2148  . magnesium hydroxide (MILK OF MAGNESIA) suspension 30 mL  30 mL Oral Daily PRN [name]. Adams, PA   30 mL at 01/03/12 1427  . multivitamin with minerals tablet 1 tablet  1 tablet Oral Daily Lavena Bullion, RD   1 tablet at 01/03/12 1806  . Norgestimate-Ethinyl Estradiol Triphasic (ORTHO TRI-CYCLEN LO,TRI-LO-SPRINTEC) 0.18/0.215/0.25 MG-25 MCG tablet 1 tablet  1 tablet Oral Daily [name]. Adams, PA   1 tablet at 01/03/12 0800  . olanzapine-FLUoxetine (SYMBYAX) 6-50 MG per capsule 1 capsule  1 capsule Oral QPM Mike Craze, MD      . ondansetron [facility]) tablet 8 mg  8 mg Oral Q8H PRN [name]. Adams, PA   8 mg at 01/02/12 0829  . oxybutynin (DITROPAN-XL) 24 hr tablet 5 mg  5 mg Oral QHS Mike Craze, MD      . pantoprazole (PROTONIX) EC tablet 40 mg  40 mg Oral Q1200 [name]. Adams, PA   40 mg at 01/03/12 1200  . promethazine (PHENERGAN) tablet 25 mg  25 mg Oral Q6H PRN [name]. Adams, PA   25 mg at 01/02/12 1610  . SUMAtriptan (IMITREX) tablet 100 mg  100 mg Oral Q2H PRN Mike Craze, MD      . tiZANidine (ZANAFLEX) tablet 4 mg  4 mg Oral TID [name]. Adams, PA   4 mg at 01/03/12 1705  . verapamil (CALAN) tablet 80 mg  80 mg Oral QPC breakfast Sanjuana Kava, NP   80 mg at 01/03/12 0800  . DISCONTD: olanzapine-FLUoxetine (SYMBYAX) 6-50 MG per capsule 1 capsule  1 capsule Oral QPM Mike Craze, MD        Lab Results:  Results for orders placed during the hospital encounter of 01/01/12 (from the past 48 hour(s))  GLUCOSE, CAPILLARY     Status: Abnormal   Collection Time   01/02/12  9:56 AM      Component Value Range Comment   Glucose-Capillary 297 (*) 70 - 99 mg/dL   GLUCOSE, CAPILLARY     Status: Abnormal   Collection Time   01/02/12  1:02 PM      Component Value Range Comment   Glucose-Capillary 211 (*) 70 - 99 mg/dL   SEDIMENTATION RATE     Status: Normal   Collection Time   01/02/12  8:53 PM      Component Value Range Comment   Sed Rate 7  0 - 22 mm/hr   T3, FREE     Status: Normal   Collection Time   01/03/12  6:25 AM      Component Value Range Comment   T3, Free 3.0  2.3 - 4.2 pg/mL   T4, FREE     Status: Normal   Collection Time   01/03/12  6:25 AM      Component Value Range Comment   Free T4 1.18  0.80 - 1.80 ng/dL   HEMOGLOBIN R6E     Status: Abnormal   Collection Time   01/03/12  6:25 AM      Component Value Range Comment   Hemoglobin A1C 5.8 (*) <5.7 %    Mean Plasma Glucose 120 (*) <117 mg/dL  TSH     Status: Normal   Collection Time   01/03/12  6:25 AM      Component  Value Range Comment   TSH 2.254  0.350 - 4.500 uIU/mL   URINALYSIS, ROUTINE W REFLEX MICROSCOPIC     Status: Abnormal   Collection Time   01/03/12  5:00 PM      Component Value Range Comment   Color, Urine YELLOW  YELLOW    APPearance CLOUDY (*) CLEAR    Specific Gravity, Urine 1.016  1.005 - 1.030    pH 6.5  5.0 - 8.0    Glucose, UA NEGATIVE  NEGATIVE mg/dL    Hgb urine dipstick NEGATIVE  NEGATIVE    Bilirubin Urine NEGATIVE  NEGATIVE    Ketones, ur NEGATIVE  NEGATIVE mg/dL    Protein, ur NEGATIVE  NEGATIVE mg/dL    Urobilinogen, UA 0.2  0.0 - 1.0 mg/dL    Nitrite NEGATIVE  NEGATIVE    Leukocytes, UA MODERATE (*) NEGATIVE   URINE MICROSCOPIC-ADD ON     Status: Abnormal   Collection Time   01/03/12  5:00 PM      Component Value Range Comment   Squamous Epithelial / LPF FEW (*) RARE    WBC, UA 3-6  <3 WBC/hpf    Bacteria, UA FEW (*) RARE     Physical Findings: AIMS: Facial and Oral Movements Muscles of Facial Expression: None, normal Lips and Perioral Area: None, normal Jaw: None, normal Tongue: None, normal,Extremity Movements Upper (arms, wrists, hands, fingers): None, normal Lower (legs, knees, ankles, toes): None, normal, Trunk Movements Neck, shoulders, hips: None, normal, Overall Severity Severity of abnormal movements (highest score from questions above): None, normal Incapacitation due to abnormal movements: None, normal Patient's awareness of abnormal movements (rate only patient's report): No Awareness, Dental Status Current problems with teeth and/or dentures?: No Does patient usually wear dentures?: No  CIWA:  CIWA-Ar Total: 12  COWS:  COWS Total Score: 6   Treatment Plan Summary: Daily contact with patient to assess and evaluate symptoms and progress in treatment Medication management  Plan: Pt notes considerable improvement in her mood and pain.  She is noting some urinary symptoms, but the UA does not support a diagnosis of a UTI.  Will order Ditropan for  symptom relief.  She is doing a lot better and could be ready for D/C tomorrow.  She is already on Vitamin D drawn, will have outpatient provider follow this.  Lorn Butcher 01/03/2012, 10:32 PM

## 2012-01-03 NOTE — Progress Notes (Signed)
Psychoeducational Group Note  Date:  01/03/2012 Time:  1100  Group Topic/Focus:  Self Esteem Action Plan:   The focus of this group is to help patients create a plan to continue to build self-esteem after discharge.  Participation Level:  Active  Participation Quality:  Appropriate and Attentive  Affect:  Appropriate  Cognitive:  Alert, Appropriate and Oriented  Insight:  Good  Engagement in Group:  Good  Additional Comments:  Pt. Participated in group by filling out worksheets and sharing her thoughts on the topics with the group  Makhayla, [place] 01/03/2012, 8:20 PM

## 2012-01-03 NOTE — Progress Notes (Signed)
[facility] Group Notes: (Counselor/Nursing/MHT/Case Management/Adjunct)  01/02/2012 1:15-2:30pm  Emotion Regulation  Type of Therapy: Group Therapy   Participation Level: None   Participation Quality: Attentive  Affect: Blunted  Cognitive: Appropriate   Insight: None   Engagement in Group: None   Engagement in Therapy: None   Modes of Intervention: Support and Exploration   Summary of Progress/Problems: Legacy  was attentive but not engaged in group process   Angus Palms, LCSW  01/02/11 3:56pm

## 2012-01-04 ENCOUNTER — Ambulatory Visit (HOSPITAL_COMMUNITY): Payer: Self-pay | Admitting: Psychiatry

## 2012-01-04 MED ORDER — PHENAZOPYRIDINE HCL 100 MG PO TABS: 100.0000 mg | ORAL_TABLET | Freq: Three times a day (TID) | ORAL | Status: AC

## 2012-01-04 MED ORDER — PHENAZOPYRIDINE HCL 100 MG PO TABS
100.0000 mg | ORAL_TABLET | Freq: Three times a day (TID) | ORAL | Status: DC
  Filled 2012-01-04 (×5): qty 1

## 2012-01-04 NOTE — Progress Notes (Signed)
Patient ID: [name], female   DOB: 1980/08/15, 31 y.o.   MRN: [id number] D: pt. In day room interacting. Pt. Reports "feel a lot better" "pain has decreased". Pt. Reports depression a "4" of 10. Pt. Reports a lot calmer, also notes that mom & dad visited this evening. Pt. Does report that she has been having problems with constipation.Pt. Also worried about lab results.  A: Writer encouraged pt. To go to group. Writer educated client on the importance of balanced diet and water, also noted that some meds are linked to slowing of GI motility.Writer assured pt. Physician will review labs with her on tomorrow. Staff will monitor q55min for safety. R: Pt. Went to [employer], reports drinking plenty of water. Pt. Is safe on the unit.

## 2012-01-04 NOTE — Progress Notes (Deleted)
Pt reports that she has passive si and depression related to "baby daddy put me out" and "I have no place to go." Pt's affect is flat and sad. Supported pt to express feeling. Encouraged pt to attend groups and think of positives in her life and what she is thankful for. Pt is receptive and smiled during conversation. Pt monitored q 15 minutes and contracts with staff for safety.

## 2012-01-04 NOTE — Progress Notes (Signed)
Psychoeducational Group Note  Date:  01/04/2012 Time:  1100  Group Topic/Focus:  Stages of Change:   The focus of this group is to explain the stages of change and help patients identify changes they want to make upon discharge.  Participation Level:  Active  Participation Quality:  Appropriate, Attentive, Sharing and Supportive  Affect:  Appropriate  Cognitive:  Alert, Appropriate and Oriented  Insight:  Good  Engagement in Group:  Good  Additional Comments:  Patient attended and participated in stages of change group today. Patient shared that she was in stages two and three of the stages of change and that she was planning on walking each day as a part of her maintaining positive change.  Lyric Hoar, [name] 01/04/2012, 12:24 PM

## 2012-01-04 NOTE — Progress Notes (Signed)
[facility] Case Management Discharge Plan:  Will you be returning to the same living situation after discharge: Yes,  return to own home At discharge, do you have transportation home?:Yes,  mother to transport pt home Do you have the ability to pay for your medications:Yes,  access to meds  Release of information consent forms completed and in the chart;  Patient's signature needed at discharge.  Patient to Follow up at:  Follow-up Information    Follow up with [facility] on 01/11/2012. (Appointment scheduled at 2:45 pm with Florencia Reasons)    Contact information:   [address], [address] [place], [place] [address] [phone]      Follow up with [facility] on 01/15/2012. (Appointment scheduled at 1:30 pm with Dr. Lolly Mustache)    Contact information:   [address] [place] [place], [place] [address] [phone]         Patient denies SI/HI:   Yes,  denies SI/HI    Safety Planning and Suicide Prevention discussed:  Yes,  discussed with pt today  Barrier to discharge identified:No.  Summary and Recommendations: Pt attended discharge planning group and actively participated in group.  SW provided pt with today's workbook.  Pt presents with bright affect and depressed mood.  Pt rates depression at a 3 and anxiety at a 4 today.  Pt denies SI.  Pt reports feeling stable to d/c today.  No recommendations from SW.  No further needs voiced by pt.  Pt stable to discharge.     Carmina Miller 01/04/2012, 11:05 AM

## 2012-01-04 NOTE — Progress Notes (Signed)
01/04/2012         Time: 1500      Group Topic/Focus: The focus of the group is on enhancing the patients' ability to utilize positive relaxation strategies by practicing several that can be used at discharge. Group discusses how relaxation exercises can be used as part of pain management and to encourage sleep as well.  Participation Level: Did not attend  Participation Quality: Not Applicable  Affect: Not Applicable  Cognitive: Not Applicable   Additional Comments: Patient discharging.   [name] 01/04/2012 3:49 PM

## 2012-01-04 NOTE — Tx Team (Signed)
Interdisciplinary Treatment Plan Update (Adult)  Date:  01/04/2012  Time Reviewed:  10:51 AM   Progress in Treatment: Attending groups: Yes Participating in groups:  Yes Taking medication as prescribed: Yes Tolerating medication:  Yes Family/Significant other contact made: Yes Patient understands diagnosis:  Yes Discussing patient identified problems/goals with staff:  Yes Medical problems stabilized or resolved:  Yes Denies suicidal/homicidal ideation: Yes Issues/concerns per patient self-inventory:  None identified Other: N/A  New problem(s) identified: None Identified  Reason for Continuation of Hospitalization: Stable to d/c  Interventions implemented related to continuation of hospitalization: Stable to d/c  Additional comments: N/A  Estimated length of stay: D/C today  Discharge Plan: Pt will follow up with [facility] in [place] for medication management and therapy.    New goal(s): N/A  Review of initial/current patient goals per problem list:    1.  Goal(s): Reduce depressive symptoms  Met:  Yes  Target date: by discharge  As evidenced by: Reducing depression from a 10 to a 3 as reported by pt.  Pt rates at a 2 today.   2.  Goal (s): Reduce/Eliminate suicidal ideation  Met:  Yes  Target date: by discharge  As evidenced by: pt reporting no SI.    3.  Goal(s): Reduce anxiety symptoms  Met:  Yes  Target date: by discharge  As evidenced by: Reduce anxiety from a 10 to a 3 as reported by pt. Pt rates at a 2 today.     Attendees: Patient:  [name] 01/04/2012 10:51 AM   Family:     Physician:  Orson Aloe, MD 01/04/2012 10:51 AM   Nursing:   Waynetta Sandy, RN 01/04/2012 10:51 AM   Case Manager:  Reyes Ivan, LCSWA 01/04/2012 10:51 AM   Counselor:  Angus Palms, LCSW 01/04/2012 10:51 AM   Other:  Berneice Heinrich, RN 01/04/2012 10:51 AM   Other:    Other:     Other:      Scribe for Treatment Team:   Carmina Miller, 01/04/2012 10:51 AM
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
## 2012-01-04 NOTE — Progress Notes (Signed)
[facility] Group Notes:  (Counselor/Nursing/MHT/Case Management/Adjunct) 01/04/2012  1:15pm-2:30pm Preventing Relapse   Type of Therapy:  Group Therapy  Participation Level:  Did Not Attend     Edoardo Laforte, LCSW           

## 2012-01-04 NOTE — Progress Notes (Signed)
[facility] MD Progress Note  01/04/2012 12:53 PM  Diagnosis:   Axis I: Major Depression, Recurrent severe Axis II: Deferred Axis III:  Past Medical History  Diagnosis Date  . HTN (hypertension)   . Fatty liver   . GERD (gastroesophageal reflux disease)   . Hypercholesterolemia   . Anastomotic ulcer     multiple  . S/P endoscopy     last done Oct 2010, multiple, usually emergent due to anastomotic strictures  . S/P colonoscopy 08/2010    normal.  . Dumping syndrome   . Blood clot due to device, implant, or graft     PICC Line   Axis IV: other psychosocial or environmental problems and problems related to social environment Axis V: 61-70 mild symptoms  ADL's:  Intact  Sleep: Good  Appetite:  Good  Suicidal Ideation:  Plan:  No Intent:  No Means:  No Homicidal Ideation:  Plan:  No Intent:  No Means:  No  AEB (as evidenced by):  Mental Status Examination/Evaluation: Objective:  Appearance: Casual  Eye Contact::  Good  Speech:  Clear and Coherent  Volume:  Normal  Mood:  Euthymic  Affect:  Congruent  Thought Process:  Coherent, Intact and Logical  Orientation:  Full  Thought Content:  WDL  Suicidal Thoughts:  No  Homicidal Thoughts:  No  Memory:  Immediate;   Good Recent;   Good Remote;   Good  Judgement:  Good  Insight:  Good  Psychomotor Activity:  Normal  Concentration:  Good  Recall:  Good  Akathisia:  No  Handed:  Right  AIMS (if indicated):     Assets:  Communication Skills Desire for Improvement  Sleep:  Number of Hours: 3    ROS: Neuro: no headaches, ataxia, weakness  GI: no N/V/D/cramps/constipation  MS: no weakness, muscle cramps, aches.  GU: pt notes increased urinary frequency and pressure.  She feels that she has a brewing UTI.  Her UA did not show any NItrites.  Will order Pyridium for the dyscomfort.  Vital Signs:Blood pressure 140/101, pulse 99, temperature 98.3 F (36.8 C), temperature source Oral, resp. rate 16, height 5\' 4"  (1.626  m), weight 101.152 kg (223 lb), last menstrual period 12/25/2011. Current Medications: Current Facility-Administered Medications  Medication Dose Route Frequency Provider Last Rate Last Dose  . acetaminophen (TYLENOL) tablet 1,000 mg  1,000 mg Oral Q6H PRN Mike Craze, MD   1,000 mg at 01/04/12 0925  . alum & mag hydroxide-simeth (MAALOX/MYLANTA) 200-200-20 MG/5ML suspension 30 mL  30 mL Oral Q4H PRN [name]. Adams, PA   30 mL at 01/03/12 2253  . calcium-vitamin D (OSCAL WITH D) 250-125 MG-UNIT per tablet 1 tablet  1 tablet Oral Daily Mike Craze, MD   1 tablet at 01/04/12 0759  . clomiPRAMINE (ANAFRANIL) capsule 50 mg  50 mg Oral QHS Mike Craze, MD   50 mg at 01/03/12 2144  . colesevelam [facility]) tablet 1,875 mg  1,875 mg Oral BID WC [name]. Adams, PA   1,875 mg at 01/04/12 0744  . feeding supplement (RESOURCE BREEZE) liquid 1 Container  1 Container Oral TID BM Lavena Bullion, RD   1 Container at 01/04/12 1004  . olanzapine-FLUoxetine (SYMBYAX) 6-25 MG per capsule 1 capsule  1 capsule Oral ONCE-1800 Mike Craze, MD   1 capsule at 01/03/12 1705   And  . FLUoxetine (PROZAC) capsule 20 mg  20 mg Oral ONCE-1800 Mike Craze, MD   20 mg at  01/03/12 1705  . gabapentin (NEURONTIN) capsule 600 mg  600 mg Oral QID Mike Craze, MD   600 mg at 01/04/12 1206  . hydrOXYzine (ATARAX/VISTARIL) tablet 25 mg  25 mg Oral TID PRN Mike Craze, MD   25 mg at 01/04/12 0748  . magnesium hydroxide (MILK OF MAGNESIA) suspension 30 mL  30 mL Oral Daily PRN [name]. Adams, PA   30 mL at 01/04/12 0926  . multivitamin with minerals tablet 1 tablet  1 tablet Oral Daily Lavena Bullion, RD   1 tablet at 01/04/12 0744  . Norgestimate-Ethinyl Estradiol Triphasic (ORTHO TRI-CYCLEN LO,TRI-LO-SPRINTEC) 0.18/0.215/0.25 MG-25 MCG tablet 1 tablet  1 tablet Oral Daily [name]. Adams, PA   1 tablet at 01/04/12 0813  . olanzapine-FLUoxetine (SYMBYAX) 6-50 MG per capsule 1 capsule  1 capsule Oral QPM Mike Craze, MD      . ondansetron [facility]) tablet 8 mg  8 mg Oral Q8H PRN [name]. Adams, PA   8 mg at 01/02/12 0829  . oxybutynin (DITROPAN-XL) 24 hr tablet 5 mg  5 mg Oral QHS Mike Craze, MD   5 mg at 01/03/12 2251  . pantoprazole (PROTONIX) EC tablet 40 mg  40 mg Oral Q1200 [name]. Adams, PA   40 mg at 01/04/12 1206  . promethazine (PHENERGAN) tablet 25 mg  25 mg Oral Q6H PRN [name]. Adams, PA   25 mg at 01/04/12 0925  . SUMAtriptan (IMITREX) tablet 100 mg  100 mg Oral Q2H PRN Mike Craze, MD      . tiZANidine (ZANAFLEX) tablet 4 mg  4 mg Oral TID [name]. Adams, PA   4 mg at 01/04/12 1206  . verapamil (CALAN) tablet 80 mg  80 mg Oral QPC breakfast Sanjuana Kava, NP   80 mg at 01/04/12 1610    Lab Results:  Results for orders placed during the hospital encounter of 01/01/12 (from the past 48 hour(s))  GLUCOSE, CAPILLARY     Status: Abnormal   Collection Time   01/02/12  1:02 PM      Component Value Range Comment   Glucose-Capillary 211 (*) 70 - 99 mg/dL   SEDIMENTATION RATE     Status: Normal   Collection Time   01/02/12  8:53 PM      Component Value Range Comment   Sed Rate 7  0 - 22 mm/hr   T3, FREE     Status: Normal   Collection Time   01/03/12  6:25 AM      Component Value Range Comment   T3, Free 3.0  2.3 - 4.2 pg/mL   T4, FREE     Status: Normal   Collection Time   01/03/12  6:25 AM      Component Value Range Comment   Free T4 1.18  0.80 - 1.80 ng/dL   HEMOGLOBIN R6E     Status: Abnormal   Collection Time   01/03/12  6:25 AM      Component Value Range Comment   Hemoglobin A1C 5.8 (*) <5.7 %    Mean Plasma Glucose 120 (*) <117 mg/dL   TSH     Status: Normal   Collection Time   01/03/12  6:25 AM      Component Value Range Comment   TSH 2.254  0.350 - 4.500 uIU/mL   VITAMIN D 25 HYDROXY     Status: Normal   Collection Time   01/03/12  6:25 AM  Component Value Range Comment   Vit D, 25-Hydroxy 42  30 - 89 ng/mL   URINALYSIS, ROUTINE W REFLEX MICROSCOPIC      Status: Abnormal   Collection Time   01/03/12  5:00 PM      Component Value Range Comment   Color, Urine YELLOW  YELLOW    APPearance CLOUDY (*) CLEAR    Specific Gravity, Urine 1.016  1.005 - 1.030    pH 6.5  5.0 - 8.0    Glucose, UA NEGATIVE  NEGATIVE mg/dL    Hgb urine dipstick NEGATIVE  NEGATIVE    Bilirubin Urine NEGATIVE  NEGATIVE    Ketones, ur NEGATIVE  NEGATIVE mg/dL    Protein, ur NEGATIVE  NEGATIVE mg/dL    Urobilinogen, UA 0.2  0.0 - 1.0 mg/dL    Nitrite NEGATIVE  NEGATIVE    Leukocytes, UA MODERATE (*) NEGATIVE   URINE MICROSCOPIC-ADD ON     Status: Abnormal   Collection Time   01/03/12  5:00 PM      Component Value Range Comment   Squamous Epithelial / LPF FEW (*) RARE    WBC, UA 3-6  <3 WBC/hpf    Bacteria, UA FEW (*) RARE     Physical Findings: AIMS: Facial and Oral Movements Muscles of Facial Expression: None, normal Lips and Perioral Area: None, normal Jaw: None, normal Tongue: None, normal,Extremity Movements Upper (arms, wrists, hands, fingers): None, normal Lower (legs, knees, ankles, toes): None, normal, Trunk Movements Neck, shoulders, hips: None, normal, Overall Severity Severity of abnormal movements (highest score from questions above): None, normal Incapacitation due to abnormal movements: None, normal Patient's awareness of abnormal movements (rate only patient's report): No Awareness, Dental Status Current problems with teeth and/or dentures?: No Does patient usually wear dentures?: No  CIWA:  CIWA-Ar Total: 12  COWS:  COWS Total Score: 6   Treatment Plan Summary: Daily contact with patient to assess and evaluate symptoms and progress in treatment Medication management  Plan: Pt still notes urinary discomfort.  Will start Pyridium and cancel the Ditrapan for that.  She has met inpatient goals and is ready for D/C.  Shianna Bally 01/04/2012, 12:53 PM

## 2012-01-04 NOTE — Progress Notes (Signed)
Patient denies SI/HI, denies A/V hallucinations. Patient verbalizes understanding of discharge instructions, follow up care and prescriptions. Patient given all belongings from [facility] locker. Patient escorted out by staff, transported by family. 

## 2012-01-04 NOTE — Progress Notes (Signed)
Pt. C/o not being able to sleep, Clinical research associate reviewed med regime and nothing else available. Writer explained to pt. That nothing else was available, but offer her milk for its relaxation properties. Writer also advise pt. To speak with doctor about meds to see if he will order another sleep aide if she felt the current one wasn't working. Pt. Nod yes in agreement.

## 2012-01-07 NOTE — Progress Notes (Signed)
Patient Discharge Instructions:  After Visit Summary (AVS):   Access to EMR:  01/07/2012 Psychiatric Admission Assessment Note:   Access to EMR:  01/07/2012 Suicide Risk Assessment - Discharge Assessment:   Access to EMR:  01/07/2012 Next Level Care Provider Has Access to the EMR, 01/07/2012  Records provided to [facility]; Dr. Lolly Mustache via [facility]/Epic access.  Wandra Scot, 01/07/2012, 3:31 PM

## 2012-01-08 ENCOUNTER — Ambulatory Visit (HOSPITAL_COMMUNITY): Payer: Self-pay | Admitting: Psychiatry

## 2012-01-10 DIAGNOSIS — I10 Essential (primary) hypertension: Secondary | ICD-10-CM | POA: Diagnosis not present

## 2012-01-10 NOTE — Discharge Summary (Signed)
Physician Discharge Summary Note  Patient:  [name] is an 31 y.o., female MRN:  [id number] DOB:  1981-04-13 Patient phone:  [phone] (home)  Patient address:   [address] [place] [address]   Date of Admission:  01/01/2012 Date of Discharge: 01/04/2012  Discharge Diagnoses: Principal Problem:  *Depression Active Problems:  OCD (obsessive compulsive disorder)  Axis Diagnosis:  Axis I: Major Depression, Recurrent severe  Axis II: Deferred  Axis III:  Past Medical History   Diagnosis  Date   .  HTN (hypertension)    .  Fatty liver    .  GERD (gastroesophageal reflux disease)    .  Hypercholesterolemia    .  Anastomotic ulcer      multiple   .  S/P endoscopy      last done Oct 2010, multiple, usually emergent due to anastomotic strictures   .  S/P colonoscopy  08/2010     normal.   .  Dumping syndrome    .  Blood clot due to device, implant, or graft      PICC Line   Axis IV: other psychosocial or environmental problems and problems related to social environment  Axis V: 61-70 mild symptoms  Level of Care:  OP  Hospital Course:   Admitted from outpatient by Dr.Arfeen he has followed since 2010. Patient received first disability check in April and had to be hospitalized at [facility] at the end of April for nausea. 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  Panic attacks have come back 2-3 times a week -but they last for hours had stopped for a year. Also can't maintain sleep wakes up every few hours.Asked to be admitted as she was hopeless and having passive SI.   While a patient in this hospital, Ms. Quilty received medication management for OCD, insomnia, back inflammation and spacticity, and pain management. They were ordered and received Symbyax for OCD, Anafranil for insomnia and OCD, Neurontin for anxiety, Oscal and Vitamin D for Vitamin D replacement, Iron for iron replacement, Voltaren gel for back inflammation, Zanaflex for spasms, Phenergan and  Zofran for nausea, Imitrex for migraines. They were also enrolled in group counseling sessions and activities in which they participated actively.   Patient attended treatment team meeting this am and met with treatment team members. Pt symptoms, treatment plan and response to treatment discussed. Ms. Lenore Manner endorsed that their symptoms have improved. Pt also stated that they are stable for discharge.  They reported that from this hospital stay they had learned was that they need to stay busy and need t stay on these the right medicines for her.  In other to maintain control of obsessive thoughts, anxiety, insomnia, migraines, back inflammation and spasms, they will continue psychiatric care on outpatient basis. They will follow-up at [facility] with Florencia Reasons on 8/9 at 14:45 and Dr Lolly Mustache on 8/13 at 13:30.  In addition they were instructed to take all your medications as prescribed by your mental healthcare provider, to report any adverse effects and or reactions from your medicines to your outpatient provider promptly, patient is instructed and cautioned to not engage in alcohol and or illegal drug use while on prescription medicines, in the event of worsening symptoms, patient is instructed to call the crisis hotline, 911 and or go to the nearest ED for appropriate evaluation and treatment of symptoms.   Upon discharge, patient adamantly denies suicidal, homicidal ideations, auditory, visual hallucinations and or delusional thinking. They left  [facility] with all personal belongings via personal transportation in no apparent distress.  Consults:  None  Significant Diagnostic Studies:  Lab: Vitamin D level, Hemoglobin A1c, Thyroid panel, UA non contributory  Discharge Vitals:   Blood pressure 140/101, pulse 99, temperature 98.3 F (36.8 C), temperature source Oral, resp. rate 16, height 5\' 4"  (1.626 m), weight 101.152 kg (223 lb), last menstrual period 12/25/2011..  Mental Status Exam: See  Mental Status Examination and Suicide Risk Assessment completed by Attending Physician prior to discharge.  Discharge destination:  Home  Is patient on multiple antipsychotic therapies at discharge:  No  Has Patient had three or more failed trials of antipsychotic monotherapy by history: N/A Recommended Plan for Multiple Antipsychotic Therapies: N/A  Medication List  As of 01/10/2012 10:58 PM   STOP taking these medications         LORazepam 1 MG tablet      MULTIVITAMIN PO      prenatal multivitamin Tabs      verapamil 240 MG CR tablet      zolpidem 12.5 MG CR tablet         TAKE these medications      Indication    calcium-vitamin D 250-125 MG-UNIT per tablet   Commonly known as: OSCAL WITH D   Take 1 tablet by mouth daily. For vitamin D replacement       clobetasol cream 0.05 %   Commonly known as: TEMOVATE   Apply 1 application topically 2 (two) times daily as needed. For eczema       clomiPRAMINE 25 MG capsule   Commonly known as: ANAFRANIL   Take 2 capsules (50 mg total) by mouth at bedtime. For OCD and insomnia       colesevelam 625 MG tablet   Commonly known as: WELCHOL   Take 3 tablets (1,875 mg total) by mouth 2 (two) times daily with a meal. To help lower cholesterol.       dexlansoprazole 60 MG capsule   Commonly known as: DEXILANT   Take 1 capsule (60 mg total) by mouth daily. For control of stomach acid secretion and helps GERD.       diclofenac sodium 1 % Gel   Commonly known as: VOLTAREN   Apply 1 application topically daily. Apply as needed to upper back for inflammation/pain       ferrous sulfate 325 (65 FE) MG tablet   Take 1 tablet (325 mg total) by mouth daily. For iron replacement.       gabapentin 300 MG capsule   Commonly known as: NEURONTIN   Take 1-2 capsules (300-600 mg total) by mouth 3 (three) times daily. Take one capsule in the morning, at noon, and two capsules at bedtime for anxiety DO not take Ativan.       multivitamin with  minerals Tabs   Take 1 tablet by mouth daily. For nutritional supplementation.       Norgestimate-Ethinyl Estradiol Triphasic 0.18/0.215/0.25 MG-35 MCG tablet   Take 1 tablet by mouth daily. For contraception.       olanzapine-FLUoxetine 6-50 MG per capsule   Commonly known as: SYMBYAX   Take 1 capsule by mouth every evening. For obsessive-compulsive disorder    Indication: Major Depressive Disorder      ondansetron 8 MG tablet   Commonly known as: ZOFRAN   Take 1 tablet (8 mg total) by mouth every 8 (eight) hours as needed for nausea.       promethazine 25 MG tablet  Commonly known as: PHENERGAN   Take 25 mg by mouth daily. For nausea       SUMAtriptan 50 MG tablet   Commonly known as: IMITREX   Take 2 tablets (100 mg total) by mouth every 2 (two) hours as needed for migraine.       tizanidine 6 MG capsule   Commonly known as: ZANAFLEX   Take 1 capsule (6 mg total) by mouth 3 (three) times daily. For spasticity       verapamil 80 MG tablet   Commonly known as: CALAN   Take 1 tablet (80 mg total) by mouth daily after breakfast.       vitamin B-12 1000 MCG tablet   Commonly known as: CYANOCOBALAMIN   Take 1 tablet (1,000 mcg total) by mouth daily. For Vitamin B12 malabsorption            Follow-up Information    Follow up with [facility] on 01/11/2012. (Appointment scheduled at 2:45 pm with Florencia Reasons)    Contact information:   [address], [address] [place], [place] [address] [phone]      Follow up with [facility] on 01/15/2012. (Appointment scheduled at 1:30 pm with Dr. Lolly Mustache)    Contact information:   [address] [place] [place], [place] [address] [phone]        Follow-up recommendations:   Activities: Resume typical activities Diet: Resume typical diet Other: Follow up with outpatient provider and report any side effects to out patient prescriber.  Comments:  Take all your medications as  prescribed by your mental healthcare provider. Report any adverse effects and or reactions from your medicines to your outpatient provider promptly. Patient is instructed and cautioned to not engage in alcohol and or illegal drug use while on prescription medicines. In the event of worsening symptoms, patient is instructed to call the crisis hotline, 911 and or go to the nearest ED for appropriate evaluation and treatment of symptoms.  SignedOrson Aloe 01/10/2012 10:58 PM

## 2012-01-11 ENCOUNTER — Ambulatory Visit (INDEPENDENT_AMBULATORY_CARE_PROVIDER_SITE_OTHER): Payer: BC Managed Care – PPO | Admitting: Psychiatry

## 2012-01-11 DIAGNOSIS — F331 Major depressive disorder, recurrent, moderate: Secondary | ICD-10-CM | POA: Diagnosis not present

## 2012-01-11 NOTE — Patient Instructions (Signed)
Discussed orally 
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
## 2012-01-15 ENCOUNTER — Encounter (HOSPITAL_COMMUNITY): Payer: Self-pay | Admitting: Psychiatry

## 2012-01-15 ENCOUNTER — Ambulatory Visit (INDEPENDENT_AMBULATORY_CARE_PROVIDER_SITE_OTHER): Payer: BC Managed Care – PPO | Admitting: Psychiatry

## 2012-01-15 VITALS — BP 130/95 | Wt 220.0 lb

## 2012-01-15 DIAGNOSIS — F329 Major depressive disorder, single episode, unspecified: Secondary | ICD-10-CM

## 2012-01-15 MED ORDER — HYDROXYZINE PAMOATE 50 MG PO CAPS: 50.0000 mg | ORAL_CAPSULE | Freq: Three times a day (TID) | ORAL | Status: DC

## 2012-01-15 MED ORDER — CLOMIPRAMINE HCL 50 MG PO CAPS: 50.0000 mg | ORAL_CAPSULE | Freq: Every day | ORAL | Status: DC
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
## 2012-01-16 DIAGNOSIS — R112 Nausea with vomiting, unspecified: Secondary | ICD-10-CM | POA: Diagnosis not present

## 2012-01-16 DIAGNOSIS — Z79899 Other long term (current) drug therapy: Secondary | ICD-10-CM | POA: Diagnosis not present

## 2012-01-17 DIAGNOSIS — F429 Obsessive-compulsive disorder, unspecified: Secondary | ICD-10-CM | POA: Diagnosis present

## 2012-01-17 DIAGNOSIS — R197 Diarrhea, unspecified: Secondary | ICD-10-CM | POA: Diagnosis present

## 2012-01-17 DIAGNOSIS — K219 Gastro-esophageal reflux disease without esophagitis: Secondary | ICD-10-CM | POA: Diagnosis present

## 2012-01-17 DIAGNOSIS — R112 Nausea with vomiting, unspecified: Secondary | ICD-10-CM | POA: Diagnosis present

## 2012-01-17 DIAGNOSIS — Z9109 Other allergy status, other than to drugs and biological substances: Secondary | ICD-10-CM | POA: Diagnosis not present

## 2012-01-17 DIAGNOSIS — E739 Lactose intolerance, unspecified: Secondary | ICD-10-CM | POA: Diagnosis present

## 2012-01-17 DIAGNOSIS — K3189 Other diseases of stomach and duodenum: Secondary | ICD-10-CM | POA: Diagnosis present

## 2012-01-17 DIAGNOSIS — E876 Hypokalemia: Secondary | ICD-10-CM | POA: Diagnosis not present

## 2012-01-17 DIAGNOSIS — Z885 Allergy status to narcotic agent status: Secondary | ICD-10-CM | POA: Diagnosis not present

## 2012-01-17 DIAGNOSIS — G4700 Insomnia, unspecified: Secondary | ICD-10-CM | POA: Diagnosis present

## 2012-01-17 DIAGNOSIS — E860 Dehydration: Secondary | ICD-10-CM | POA: Diagnosis present

## 2012-01-17 DIAGNOSIS — D649 Anemia, unspecified: Secondary | ICD-10-CM | POA: Diagnosis present

## 2012-01-17 DIAGNOSIS — Z883 Allergy status to other anti-infective agents status: Secondary | ICD-10-CM | POA: Diagnosis not present

## 2012-01-17 DIAGNOSIS — F411 Generalized anxiety disorder: Secondary | ICD-10-CM | POA: Diagnosis present

## 2012-01-17 DIAGNOSIS — G4730 Sleep apnea, unspecified: Secondary | ICD-10-CM | POA: Diagnosis present

## 2012-01-17 DIAGNOSIS — I82729 Chronic embolism and thrombosis of deep veins of unspecified upper extremity: Secondary | ICD-10-CM | POA: Diagnosis present

## 2012-01-17 DIAGNOSIS — K911 Postgastric surgery syndromes: Secondary | ICD-10-CM | POA: Diagnosis present

## 2012-01-17 DIAGNOSIS — Z6837 Body mass index (BMI) 37.0-37.9, adult: Secondary | ICD-10-CM | POA: Diagnosis not present

## 2012-01-17 DIAGNOSIS — I82629 Acute embolism and thrombosis of deep veins of unspecified upper extremity: Secondary | ICD-10-CM | POA: Diagnosis present

## 2012-01-17 DIAGNOSIS — K224 Dyskinesia of esophagus: Secondary | ICD-10-CM | POA: Diagnosis present

## 2012-01-17 DIAGNOSIS — I10 Essential (primary) hypertension: Secondary | ICD-10-CM | POA: Diagnosis present

## 2012-01-17 DIAGNOSIS — Z9884 Bariatric surgery status: Secondary | ICD-10-CM | POA: Diagnosis not present

## 2012-01-17 DIAGNOSIS — I959 Hypotension, unspecified: Secondary | ICD-10-CM | POA: Diagnosis not present

## 2012-01-17 DIAGNOSIS — F329 Major depressive disorder, single episode, unspecified: Secondary | ICD-10-CM | POA: Diagnosis present

## 2012-01-18 ENCOUNTER — Ambulatory Visit (HOSPITAL_COMMUNITY): Payer: Self-pay | Admitting: Psychiatry

## 2012-01-18 DIAGNOSIS — I10 Essential (primary) hypertension: Secondary | ICD-10-CM | POA: Insufficient documentation

## 2012-02-12 DIAGNOSIS — G8929 Other chronic pain: Secondary | ICD-10-CM | POA: Diagnosis not present

## 2012-02-12 DIAGNOSIS — R1013 Epigastric pain: Secondary | ICD-10-CM | POA: Diagnosis not present

## 2012-02-12 DIAGNOSIS — G894 Chronic pain syndrome: Secondary | ICD-10-CM | POA: Diagnosis not present

## 2012-02-13 ENCOUNTER — Ambulatory Visit (INDEPENDENT_AMBULATORY_CARE_PROVIDER_SITE_OTHER): Payer: BC Managed Care – PPO | Admitting: Psychiatry

## 2012-02-13 DIAGNOSIS — F331 Major depressive disorder, recurrent, moderate: Secondary | ICD-10-CM | POA: Diagnosis not present

## 2012-02-13 DIAGNOSIS — Z23 Encounter for immunization: Secondary | ICD-10-CM | POA: Diagnosis not present

## 2012-02-14 DIAGNOSIS — Z3202 Encounter for pregnancy test, result negative: Secondary | ICD-10-CM | POA: Diagnosis not present

## 2012-02-14 DIAGNOSIS — E348 Other specified endocrine disorders: Secondary | ICD-10-CM | POA: Diagnosis not present

## 2012-02-14 DIAGNOSIS — R51 Headache: Secondary | ICD-10-CM | POA: Diagnosis not present

## 2012-02-14 DIAGNOSIS — Z309 Encounter for contraceptive management, unspecified: Secondary | ICD-10-CM | POA: Diagnosis not present

## 2012-02-14 NOTE — Progress Notes (Signed)
Patient:  [name]   DOB: 1980/09/26  MR Number: [id number]  Location: Behavioral Health Center:  [address] [place] [place],  [place], [address]  Start: Wednesday 02/13/2012 2:00 PM End: Wednesday 02/13/2012 2:50 PM  Provider/Observer:     Florencia Reasons, MSW, LCSW   Chief Complaint:      Chief Complaint  Patient presents with  . Depression    Reason For Service:   The patient  resumed services with this clinician as patient was experiencing increased symptoms of anxiety and depression related to chronic health issues. Patient has been seen in this practice since 2009 and continues to see psychiatrist Dr. Lolly Mustache for medication management. She participated in outpatient psychotherapy with this clinician from 04/08/2008 through 08/04/2008. The patient has multiple health issues including dumping syndrome which causes patient to experience chronic pain, constant nausea, and other gastrointestinal issues. The patient was hospitalized at the [facility] from 01/02/2012- 01/04/2012 due to to increased symptoms of depression and feelings of helplessness along with anxiety and panic attacks.  The patient is seen today for followup appointment.   Interventions Strategy:  Supportive therapy, cognitive behavioral therapy  Participation Level:   Active  Participation Quality:  Appropriate      Behavioral Observation:  Casual, Alert, and Depressed, Anxious  Current Psychosocial Factors: The patient reports being hospitalized at [facility] from 01/17/2012 through 02/04/2012 due to stomach issues. She reports continued marital discord.  Content of Session:   Reviewing symptoms, processing feelings, identifying ways to improve assertiveness skills, identify ways to use her support system  Current Status:   The patient reports anxiety, panic attacks, and depressed mood. She reports no suicidal thoughts.  Patient Progress:   Fair. Patient reports being hospitalized at  [facility] for about 2-1/2 weeks due to to increased nausea and stomach pain. Patient reports being seen by several medical teams and being informed that her esophagus does not squeeze food properly and that her sphincter is not working appropriately. Patient expresses relief that there is actual physical evidence to identify her problems and that her concerns are valid rather than just being psychological. Patient also reports being interviewed by a team from the psychiatric unit who informed patient that she is taking the right medication regimen but recommended that one of the psychotropic medications be increased. Patient will discuss this with Dr. Lolly Mustache. Patient expresses frustration with the surgeon who did her bypass surgery as he made negative comments to patient when she was hospitalized this last time and took no responsibility for problems that occurred during the bypass surgery per patient's report. She is scheduled to see this particular physician next week and expresses anxiety as she is intimidated. Therapist works with patient to problem solve and explore her options. Patient plans to postpone the appointtment and attend the appointment when her mother will be able to go with patient for support. Patient reports her husband was very supportive while she was in the hospital but has resumed negative behavior in his interaction with patient now that she is home. She expresses frustration, anger, and disappointment.  Target Goals:   1. Decrease anxiety and excessive worry. 2.Improve mood. 3.  Reduce obsessive-compulsive behavior as evidenced by eliminating stockpiling hygiene products. 4. Process grief and loss issues.  Last Reviewed:   12/11/2011  Goals Addressed Today:    Decrease anxiety and excessive worry, improve mood   Impression/Diagnosis:   Patient presents with a history of symptoms of  depression and anxiety since age 31. She has had 2 psychiatric hospitalizations  due to to depression and anxiety. Her symptoms have worsened in the past 6 months as she has experienced increased chronic health issues. Her current symptoms include depressed mood, anxiety, sleep difficulty, excessive worrying, panic attacks, feelings of worthlessness, and poor self image. Diagnoses: Major depressive disorder, recurrent, moderate, rule out OCD     Diagnosis:  Axis I:  1. Major depressive disorder, recurrent, moderate             Axis II: Deferred

## 2012-02-14 NOTE — Patient Instructions (Signed)
Discussed orally 

## 2012-02-19 ENCOUNTER — Encounter (HOSPITAL_COMMUNITY): Payer: Self-pay | Admitting: Psychiatry

## 2012-02-19 ENCOUNTER — Ambulatory Visit (INDEPENDENT_AMBULATORY_CARE_PROVIDER_SITE_OTHER): Payer: BC Managed Care – PPO | Admitting: Psychiatry

## 2012-02-19 VITALS — Wt 216.0 lb

## 2012-02-19 DIAGNOSIS — F329 Major depressive disorder, single episode, unspecified: Secondary | ICD-10-CM

## 2012-02-19 MED ORDER — CLOMIPRAMINE HCL 50 MG PO CAPS: 50.0000 mg | ORAL_CAPSULE | Freq: Every day | ORAL | Status: DC

## 2012-02-19 MED ORDER — ZOLPIDEM TARTRATE ER 12.5 MG PO TBCR: 12.5000 mg | EXTENDED_RELEASE_TABLET | Freq: Every evening | ORAL | Status: DC | PRN

## 2012-02-19 MED ORDER — OLANZAPINE-FLUOXETINE HCL 6-50 MG PO CAPS: 1.0000 | ORAL_CAPSULE | Freq: Every evening | ORAL | Status: DC

## 2012-02-19 MED ORDER — LORAZEPAM 1 MG PO TABS: 1.0000 mg | ORAL_TABLET | Freq: Every day | ORAL | Status: DC | PRN
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
## 2012-02-20 DIAGNOSIS — R197 Diarrhea, unspecified: Secondary | ICD-10-CM | POA: Diagnosis not present

## 2012-02-21 ENCOUNTER — Ambulatory Visit (HOSPITAL_COMMUNITY): Payer: Self-pay | Admitting: Psychiatry

## 2012-02-22 ENCOUNTER — Ambulatory Visit (INDEPENDENT_AMBULATORY_CARE_PROVIDER_SITE_OTHER): Payer: BC Managed Care – PPO | Admitting: Psychiatry

## 2012-02-22 DIAGNOSIS — F331 Major depressive disorder, recurrent, moderate: Secondary | ICD-10-CM | POA: Diagnosis not present

## 2012-02-25 NOTE — Progress Notes (Addendum)
Patient:  [name]   DOB: [birth year]  MR Number: [id number]  Location: Behavioral Health Center:  [address] [place],  [place], [address]  Start: Friday 02/22/2012 3:00 PM End: Friday 02/22/2012 3:50 PM  Provider/Observer:     Florencia Reasons, MSW, LCSW   Chief Complaint:      Chief Complaint  Patient presents with  . Depression  . Anxiety    Reason For Service:   The patient  resumed services with this clinician as patient was experiencing increased symptoms of anxiety and depression related to chronic health issues. Patient has been seen in this practice since 2009 and continues to see psychiatrist Dr. Lolly Mustache for medication management. She participated in outpatient psychotherapy with this clinician from 04/08/2008 through 08/04/2008. The patient has multiple health issues including dumping syndrome which causes patient to experience chronic pain, constant nausea, and other gastrointestinal issues. The patient was hospitalized at the [facility] from 01/02/2012- 01/04/2012 due to to increased symptoms of depression and feelings of helplessness along with anxiety and panic attacks.  The patient is seen today for followup appointment.   Interventions Strategy:  Supportive therapy, cognitive behavioral therapy  Participation Level:   Active  Participation Quality:  Appropriate      Behavioral Observation:  Casual, Alert, and Depressed, Anxious  Current Psychosocial Factors: The patient reports recently receiving a report report from her gastroenterologist indicating no other known options to improve patient's condition. She reports continued marital discord.  Content of Session:   Reviewing symptoms, processing feelings, identifying ways to use support system, identifying coping and distracting activities  Current Status:   The patient reports depressed mood, anxiety, panic attacks (5-8 daily), and  crying spells. She reports current passive suicidal ideations with no intent and no plan. She contracts for safety. She agrees to call this practice, call 911, or have someone take her to the emergency room should symptoms worsen.  Patient Progress:   Poor. Patient reports attending an appointment with her gastroenterologist on Wednesday, 02/20/2012 and being informed that there was nothing else her doctor think of to improve patient's condition. She reports becoming very upset and hopeless at that time and taking a pill overdose using a combination of Ambien, Ativan, trazodone, and another medication Wednesday night. Her sister called her after she had taken the medication and called patient's parents after suspecting something was wrong with patient. Per patient's report, her parents came over to her house but patient did not remember anything until Thursday afternoon. She states she has not been alone since that time and that her family has taken away her medication. Patient continues to have passive suicidal ideations with no intent and no plan. She contracts for safety. Therapist calls patient's husband, Judd Gaudier, [phone], with patient's permission to facilitate more support for patient and to request his continued assistance in managing patient's medication at this time and to take patient to the emergency room should she experience worsening symptoms or suicidal ideation. He agrees. Patient also shares with therapist that her husband is frustrated and has accused her of being selfish as he does not understand depression. Therapist and patient agree to include husband in the next session. Therapist also works with patient to schedule an earlier appointment with Dr. Lolly Mustache for medication management. She agrees  to see him on 02/26/2012. Therapist also works with patient to identify distracting activities patient can use as well as ways to use her support system. Patient reports that she and her  husband are babysitting on Saturday. She also expresses a desire to attend a festival Saturday afternoon.   Target Goals:   1. Decrease anxiety and excessive worry. 2.Improve mood. 3.  Reduce obsessive-compulsive behavior as evidenced by eliminating stockpiling hygiene products. 4. Process grief and loss issues.  Last Reviewed:   12/11/2011  Goals Addressed Today:    Decrease anxiety and excessive worry, improve mood   Impression/Diagnosis:   Patient presents with a history of symptoms of depression and anxiety since age 31. She has had 2 psychiatric hospitalizations due to to depression and anxiety. Her symptoms have worsened in the past 30 months as she has experienced increased chronic health issues. Her current symptoms include depressed mood, anxiety, sleep difficulty, excessive worrying, panic attacks, feelings of worthlessness, and poor self image. Diagnoses: Major depressive disorder, recurrent, moderate, rule out OCD     Diagnosis:  Axis I:  1. Major depressive disorder, recurrent, moderate             Axis II: Deferred                HPI Review of Systems Physical Exam

## 2012-02-25 NOTE — Patient Instructions (Signed)
Discussed orally 

## 2012-02-26 ENCOUNTER — Telehealth (HOSPITAL_COMMUNITY): Payer: Self-pay

## 2012-02-26 ENCOUNTER — Encounter (HOSPITAL_COMMUNITY): Payer: Self-pay | Admitting: Psychiatry

## 2012-02-26 ENCOUNTER — Inpatient Hospital Stay (HOSPITAL_COMMUNITY)
Admission: AD | Admit: 2012-02-26 | Discharge: 2012-02-28 | DRG: 430 | Disposition: A | Discharge status: Home / Self Care | Payer: BC Managed Care – PPO | Source: Ambulatory Visit | Attending: Psychiatry | Admitting: Psychiatry

## 2012-02-26 ENCOUNTER — Ambulatory Visit (INDEPENDENT_AMBULATORY_CARE_PROVIDER_SITE_OTHER): Payer: BC Managed Care – PPO | Admitting: Psychiatry

## 2012-02-26 ENCOUNTER — Encounter (HOSPITAL_COMMUNITY): Payer: Self-pay | Admitting: *Deleted

## 2012-02-26 DIAGNOSIS — Z86718 Personal history of other venous thrombosis and embolism: Secondary | ICD-10-CM

## 2012-02-26 DIAGNOSIS — G8929 Other chronic pain: Secondary | ICD-10-CM | POA: Diagnosis present

## 2012-02-26 DIAGNOSIS — F429 Obsessive-compulsive disorder, unspecified: Secondary | ICD-10-CM

## 2012-02-26 DIAGNOSIS — E876 Hypokalemia: Secondary | ICD-10-CM | POA: Diagnosis present

## 2012-02-26 DIAGNOSIS — Z8711 Personal history of peptic ulcer disease: Secondary | ICD-10-CM

## 2012-02-26 DIAGNOSIS — K7689 Other specified diseases of liver: Secondary | ICD-10-CM | POA: Diagnosis present

## 2012-02-26 DIAGNOSIS — F32A Depression, unspecified: Secondary | ICD-10-CM

## 2012-02-26 DIAGNOSIS — I10 Essential (primary) hypertension: Secondary | ICD-10-CM | POA: Diagnosis present

## 2012-02-26 DIAGNOSIS — L719 Rosacea, unspecified: Secondary | ICD-10-CM | POA: Diagnosis not present

## 2012-02-26 DIAGNOSIS — K912 Postsurgical malabsorption, not elsewhere classified: Secondary | ICD-10-CM | POA: Diagnosis present

## 2012-02-26 DIAGNOSIS — F329 Major depressive disorder, single episode, unspecified: Secondary | ICD-10-CM

## 2012-02-26 DIAGNOSIS — F322 Major depressive disorder, single episode, severe without psychotic features: Secondary | ICD-10-CM

## 2012-02-26 DIAGNOSIS — E119 Type 2 diabetes mellitus without complications: Secondary | ICD-10-CM | POA: Diagnosis present

## 2012-02-26 DIAGNOSIS — F332 Major depressive disorder, recurrent severe without psychotic features: Principal | ICD-10-CM | POA: Diagnosis present

## 2012-02-26 DIAGNOSIS — Z79899 Other long term (current) drug therapy: Secondary | ICD-10-CM

## 2012-02-26 DIAGNOSIS — F331 Major depressive disorder, recurrent, moderate: Secondary | ICD-10-CM

## 2012-02-26 DIAGNOSIS — F1320 Sedative, hypnotic or anxiolytic dependence, uncomplicated: Secondary | ICD-10-CM | POA: Diagnosis present

## 2012-02-26 DIAGNOSIS — R109 Unspecified abdominal pain: Secondary | ICD-10-CM | POA: Diagnosis present

## 2012-02-26 DIAGNOSIS — Z7901 Long term (current) use of anticoagulants: Secondary | ICD-10-CM

## 2012-02-26 DIAGNOSIS — E7800 Pure hypercholesterolemia, unspecified: Secondary | ICD-10-CM | POA: Diagnosis present

## 2012-02-26 DIAGNOSIS — K219 Gastro-esophageal reflux disease without esophagitis: Secondary | ICD-10-CM | POA: Diagnosis present

## 2012-02-26 DIAGNOSIS — K911 Postgastric surgery syndromes: Secondary | ICD-10-CM | POA: Diagnosis present

## 2012-02-26 MED ORDER — WARFARIN SODIUM 5 MG PO TABS: 5.0000 mg | ORAL_TABLET | Freq: Every day | ORAL | Status: DC

## 2012-02-26 MED ORDER — TIZANIDINE HCL 4 MG PO TABS
6.0000 mg | ORAL_TABLET | Freq: Three times a day (TID) | ORAL | Status: DC
  Administered 2012-02-27 – 2012-02-28 (×5): 6 mg via ORAL
  Filled 2012-02-26 (×10): qty 1

## 2012-02-26 MED ORDER — ENOXAPARIN SODIUM 100 MG/ML ~~LOC~~ SOLN
100.0000 mg | Freq: Two times a day (BID) | SUBCUTANEOUS | Status: DC
  Administered 2012-02-26 – 2012-02-28 (×4): 100 mg via SUBCUTANEOUS
  Filled 2012-02-26 (×8): qty 1

## 2012-02-26 MED ORDER — PROMETHAZINE HCL 25 MG PO TABS
25.0000 mg | ORAL_TABLET | Freq: Four times a day (QID) | ORAL | Status: DC | PRN
  Administered 2012-02-27: 25 mg via ORAL
  Filled 2012-02-26: qty 1

## 2012-02-26 MED ORDER — OLANZAPINE-FLUOXETINE HCL 6-50 MG PO CAPS
1.0000 | ORAL_CAPSULE | Freq: Every day | ORAL | Status: DC
  Filled 2012-02-26 (×5): qty 1

## 2012-02-26 MED ORDER — MAGNESIUM HYDROXIDE 400 MG/5ML PO SUSP: 30.0000 mL | Freq: Every day | ORAL | Status: DC | PRN

## 2012-02-26 MED ORDER — ONDANSETRON HCL 4 MG PO TABS: 8.0000 mg | ORAL_TABLET | Freq: Three times a day (TID) | ORAL | Status: DC | PRN

## 2012-02-26 MED ORDER — COLESEVELAM HCL 625 MG PO TABS
1875.0000 mg | ORAL_TABLET | Freq: Two times a day (BID) | ORAL | Status: DC
  Administered 2012-02-27 – 2012-02-28 (×3): 1875 mg via ORAL
  Filled 2012-02-26 (×7): qty 3

## 2012-02-26 MED ORDER — CALCIUM CARBONATE-VITAMIN D 500-200 MG-UNIT PO TABS
1.0000 | ORAL_TABLET | Freq: Every day | ORAL | Status: DC
  Administered 2012-02-27 – 2012-02-28 (×2): 1 via ORAL
  Filled 2012-02-26 (×5): qty 1

## 2012-02-26 MED ORDER — TIZANIDINE HCL 4 MG PO TABS
6.0000 mg | ORAL_TABLET | Freq: Once | ORAL | Status: AC
  Administered 2012-02-26: 6 mg via ORAL
  Filled 2012-02-26: qty 1

## 2012-02-26 MED ORDER — HYDROXYZINE HCL 50 MG PO TABS
50.0000 mg | ORAL_TABLET | Freq: Every evening | ORAL | Status: DC | PRN
  Administered 2012-02-26 – 2012-02-27 (×4): 50 mg via ORAL
  Filled 2012-02-26 (×9): qty 1

## 2012-02-26 MED ORDER — VERAPAMIL HCL 80 MG PO TABS
80.0000 mg | ORAL_TABLET | Freq: Once | ORAL | Status: AC
  Administered 2012-02-26: 80 mg via ORAL
  Filled 2012-02-26: qty 1

## 2012-02-26 MED ORDER — GABAPENTIN 600 MG PO TABS
600.0000 mg | ORAL_TABLET | Freq: Every day | ORAL | Status: DC
  Administered 2012-02-27 – 2012-02-28 (×2): 600 mg via ORAL
  Filled 2012-02-26 (×4): qty 1
  Filled 2012-02-26: qty 2

## 2012-02-26 MED ORDER — ACETAMINOPHEN 325 MG PO TABS: 650.0000 mg | ORAL_TABLET | Freq: Four times a day (QID) | ORAL | Status: DC | PRN

## 2012-02-26 MED ORDER — COMPLETENATE 29-1 MG PO CHEW
1.0000 | CHEWABLE_TABLET | Freq: Every day | ORAL | Status: DC
  Administered 2012-02-27 – 2012-02-28 (×2): 1 via ORAL
  Filled 2012-02-26 (×4): qty 1

## 2012-02-26 MED ORDER — OLANZAPINE-FLUOXETINE HCL 6-25 MG PO CAPS
1.0000 | ORAL_CAPSULE | Freq: Once | ORAL | Status: AC
  Administered 2012-02-26: 1 via ORAL
  Filled 2012-02-26: qty 1

## 2012-02-26 MED ORDER — LORAZEPAM 1 MG PO TABS
1.0000 mg | ORAL_TABLET | Freq: Three times a day (TID) | ORAL | Status: DC
  Administered 2012-02-27 (×2): 1 mg via ORAL
  Filled 2012-02-26 (×3): qty 1

## 2012-02-26 MED ORDER — SUCRALFATE 1 G PO TABS
1.0000 g | ORAL_TABLET | Freq: Four times a day (QID) | ORAL | Status: DC
  Administered 2012-02-26 – 2012-02-28 (×7): 1 g via ORAL
  Filled 2012-02-26 (×16): qty 1

## 2012-02-26 MED ORDER — VERAPAMIL HCL 120 MG PO TABS
240.0000 mg | ORAL_TABLET | Freq: Every day | ORAL | Status: DC
  Administered 2012-02-27: 240 mg via ORAL
  Filled 2012-02-26 (×3): qty 2

## 2012-02-26 MED ORDER — GABAPENTIN 600 MG PO TABS
1800.0000 mg | ORAL_TABLET | Freq: Every day | ORAL | Status: DC
  Administered 2012-02-26 – 2012-02-27 (×2): 1800 mg via ORAL
  Filled 2012-02-26 (×5): qty 3

## 2012-02-26 MED ORDER — PANTOPRAZOLE SODIUM 40 MG PO TBEC
40.0000 mg | DELAYED_RELEASE_TABLET | Freq: Two times a day (BID) | ORAL | Status: DC
  Administered 2012-02-27 – 2012-02-28 (×3): 40 mg via ORAL
  Filled 2012-02-26 (×8): qty 1

## 2012-02-26 MED ORDER — CLOMIPRAMINE HCL 25 MG PO CAPS
50.0000 mg | ORAL_CAPSULE | Freq: Every day | ORAL | Status: DC
  Administered 2012-02-26 – 2012-02-27 (×2): 50 mg via ORAL
  Filled 2012-02-26 (×5): qty 2

## 2012-02-26 MED ORDER — ZOLPIDEM TARTRATE 5 MG PO TABS: 5.0000 mg | ORAL_TABLET | Freq: Every evening | ORAL | Status: DC | PRN

## 2012-02-26 MED ORDER — FLUOXETINE HCL 20 MG PO CAPS
20.0000 mg | ORAL_CAPSULE | Freq: Once | ORAL | Status: AC
  Administered 2012-02-26: 20 mg via ORAL
  Filled 2012-02-26 (×2): qty 1

## 2012-02-26 NOTE — BH Assessment (Signed)
Assessment Note   [name] is an 31 y.o. female who was recently hospitalized at [facility] from 01/15/12 to 02/05/12 for dumping syndrome with a 25 pound weight loss. She found out last Wednesday that there is nothing that can be done medically for her condition and overdosed on Ambien and Ativan, but did not seek treatment. She saw Dr. Lolly Mustache today as an outpatient and was directly admitted to [facility]. 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  Axis I: Major Depression, Recurrent severe Axis II: No diagnosis Axis III:  Past Medical History  Diagnosis Date  . HTN (hypertension)   . Fatty liver   . GERD (gastroesophageal reflux disease)   . Hypercholesterolemia   . Anastomotic ulcer     multiple  . S/P endoscopy     last done Oct 2010, multiple, usually emergent due to anastomotic strictures  . S/P colonoscopy 08/2010    normal.  . Dumping syndrome   . Blood clot due to device, implant, or graft     PICC Line   Axis IV: other psychosocial or environmental problems Axis V: 21-30 behavior considerably influenced by delusions or hallucinations OR serious impairment in judgment, communication OR inability to function in almost all areas  Past Medical History:  Past Medical History  Diagnosis Date  . HTN (hypertension)   . Fatty liver   . GERD (gastroesophageal reflux disease)   . Hypercholesterolemia   . Anastomotic ulcer     multiple  . S/P endoscopy     last done Oct 2010, multiple, usually emergent due to anastomotic strictures  . S/P colonoscopy 08/2010    normal.  . Dumping syndrome   . Blood clot due to device, implant, or graft     PICC Line    Past Surgical History  Procedure Date  . Gastric bypass 2006    Dr. Lily Peer  . Knee surgery     x3  . Tonsilectomy, adenoidectomy, bilateral myringotomy and tubes     as a child  . Left breast surgery     benign  . Cholecystectomy   . Tonsillectomy     Family History:  Family  History  Problem Relation Age of Onset  . Diabetes Mother   . Hypertension Mother   . Hyperlipidemia Mother   . Hypertension Father   . Diabetes Father   . Depression Father   . Depression Sister   . Depression Brother     Social History:  reports that she has never smoked. She has never used smokeless tobacco. She reports that she does not drink alcohol or use illicit drugs.  Additional Social History:  Alcohol / Drug Use Pain Medications: See home med list Prescriptions: see home med list Over the Counter: See home med list History of alcohol / drug use?: No history of alcohol / drug abuse  CIWA:   COWS:    Allergies:  Allergies  Allergen Reactions  . Cephalexin   . Cephalexin   . Codeine   . Skin Comfort (Alum Sulfate-Ca Acetate)     Home Medications:  Medications Prior to Admission  Medication Sig Dispense Refill  . calcium-vitamin D (CALCIUM + D) 250-125 MG-UNIT per tablet Take 1 tablet by mouth daily. For vitamin D replacement  30 tablet  0  . clomiPRAMINE (ANAFRANIL) 50 MG capsule Take 1 capsule (50 mg total) by mouth at bedtime.  30 capsule  1  . colesevelam [facility])  625 MG tablet Take 3 tablets (1,875 mg total) by mouth 2 (two) times daily with a meal. To help lower cholesterol.  180 tablet  0  . ferrous sulfate (IRON SUPPLEMENT) 325 (65 FE) MG tablet Take 1 tablet (325 mg total) by mouth daily. For iron replacement.  30 tablet  0  . gabapentin (NEURONTIN) 300 MG capsule Take 600 mg by mouth 2 (two) times daily. Take one capsule in the morning, at noon, and three at bedtime.      . hydrOXYzine (ATARAX/VISTARIL) 50 MG tablet Take 50 mg by mouth 3 (three) times daily.      Marland Kitchen LORazepam (ATIVAN) 1 MG tablet Take 1 tablet (1 mg total) by mouth daily as needed for anxiety.  30 tablet  1  . Multiple Vitamin (MULTIVITAMIN WITH MINERALS) TABS Take 1 tablet by mouth daily. For nutritional supplementation.  30 tablet  0  . olanzapine-FLUoxetine (SYMBYAX) 6-50 MG per capsule  Take 1 capsule by mouth every evening.  90 capsule  0  . ondansetron (ZOFRAN) 8 MG tablet Take 1 tablet (8 mg total) by mouth every 8 (eight) hours as needed for nausea.  20 tablet  0  . pantoprazole (PROTONIX) 40 MG tablet Take 40 mg by mouth 2 (two) times daily.      . promethazine (PHENERGAN) 25 MG tablet Take 25 mg by mouth daily. For nausea      . sucralfate (CARAFATE) 1 G tablet Take 1 g by mouth 4 (four) times daily.      . SUMAtriptan (IMITREX) 50 MG tablet Take 2 tablets (100 mg total) by mouth every 2 (two) hours as needed for migraine.  10 tablet  0  . tizanidine (ZANAFLEX) 6 MG capsule Take 1 capsule (6 mg total) by mouth 3 (three) times daily. For spasticity  90 capsule  0  . verapamil (CALAN) 80 MG tablet Take 240 mg by mouth daily after breakfast.      . vitamin B-12 (CYANOCOBALAMIN) 1000 MCG tablet Take 1 tablet (1,000 mcg total) by mouth daily. For Vitamin B12 malabsorption  30 tablet  0  . warfarin (COUMADIN) 5 MG tablet       . zolpidem (AMBIEN CR) 12.5 MG CR tablet Take 1 tablet (12.5 mg total) by mouth at bedtime as needed for sleep.  30 tablet  1  . DISCONTD: gabapentin (NEURONTIN) 300 MG capsule Take 1-2 capsules (300-600 mg total) by mouth 3 (three) times daily. Take one capsule in the morning, at noon, and two capsules at bedtime for anxiety DO not take Ativan.  120 capsule  0  . DISCONTD: verapamil (CALAN) 80 MG tablet Take 1 tablet (80 mg total) by mouth daily after breakfast.  30 tablet  0  . clobetasol cream (TEMOVATE) 0.05 % Apply 1 application topically 2 (two) times daily as needed. For eczema      . dexlansoprazole (DEXILANT) 60 MG capsule Take 1 capsule (60 mg total) by mouth daily. For control of stomach acid secretion and helps GERD.  30 capsule  0  . diclofenac sodium (VOLTAREN) 1 % GEL Apply 1 application topically daily. Apply as needed to upper back for inflammation/pain  100 g  0  . Norgestimate-Ethinyl Estradiol Triphasic (TRI-PREVIFEM) 0.18/0.215/0.25 MG-35  MCG tablet Take 1 tablet by mouth daily. For contraception.  1 Package  0    OB/GYN Status:  Patient's last menstrual period was 01/20/2012.  General Assessment Data Location of Assessment: [facility] Assessment Services Living Arrangements: Spouse/significant other Can pt  return to current living arrangement?: Yes Admission Status: Voluntary Is patient capable of signing voluntary admission?: Yes Transfer from: Home Referral Source: Psychiatrist     Risk to self Suicidal Ideation: Yes-Currently Present Suicidal Intent: Yes-Currently Present Is patient at risk for suicide?: Yes Suicidal Plan?: Yes-Currently Present Specify Current Suicidal Plan: overdosed Access to Means: No Specify Access to Suicidal Means:  (husband has medication) What has been your use of drugs/alcohol within the last 12 months?: 0 Previous Attempts/Gestures: No How many times?: 0  Triggers for Past Attempts: Other (Comment) (medical problems) Intentional Self Injurious Behavior: None Family Suicide History: No Recent stressful life event(s): Other (Comment) (intractable dumping syndrome) Persecutory voices/beliefs?: No Depression: Yes Depression Symptoms: Despondent;Insomnia;Tearfulness;Isolating;Fatigue;Guilt;Loss of interest in usual pleasures;Feeling worthless/self pity;Feeling angry/irritable Substance abuse history and/or treatment for substance abuse?: No Suicide prevention information given to non-admitted patients: Not applicable  Risk to Others Homicidal Ideation: No Thoughts of Harm to Others: No Current Homicidal Intent: No Current Homicidal Plan: No Access to Homicidal Means: No History of harm to others?: No Assessment of Violence: None Noted Does patient have access to weapons?: No Criminal Charges Pending?: No Does patient have a court date: No  Psychosis Hallucinations: None noted Delusions: None noted  Mental Status Report Appear/Hygiene: Other (Comment) (neat) Eye Contact:  Good Motor Activity: Unremarkable Speech: Logical/coherent Level of Consciousness: Alert Mood: Depressed Affect: Sad Anxiety Level: None Thought Processes: Coherent Judgement: Impaired Orientation: Person;Place;Time;Situation Obsessive Compulsive Thoughts/Behaviors: Moderate  Cognitive Functioning Concentration: Normal Memory: Recent Intact;Remote Intact IQ: Average Insight: Fair Impulse Control: Poor Appetite: Poor Weight Loss: 25  Sleep: Decreased Total Hours of Sleep:  (none in two weeks)  ADLScreening [facility] Assessment Services) Patient's cognitive ability adequate to safely complete daily activities?: Yes Patient able to express need for assistance with ADLs?: Yes Independently performs ADLs?: Yes (appropriate for developmental age)  Abuse/Neglect [facility]) Physical Abuse: Denies Verbal Abuse: Denies Sexual Abuse: Denies, provider concered (Comment)  Prior Inpatient Therapy Prior Inpatient Therapy: Yes Prior Therapy Dates:  (2013, 2008, 2009) Prior Therapy Facilty/Provider(s): FACILITY IN [place], [facility] Reason for Treatment: DEPRESSION S/I  Prior Outpatient Therapy Prior Outpatient Therapy: Yes Prior Therapy Dates: 2010 TO CURRENT Prior Therapy Facilty/Provider(s): [facility]FE[facility] Reason for Treatment: DEPRESSION  ADL Screening (condition at time of admission) Patient's cognitive ability adequate to safely complete daily activities?: Yes Patient able to express need for assistance with ADLs?: Yes Independently performs ADLs?: Yes (appropriate for developmental age) Weakness of Legs: None Weakness of Arms/Hands: None       Abuse/Neglect Assessment (Assessment to be complete while patient is alone) Physical Abuse: Denies Verbal Abuse: Denies Sexual Abuse: Denies, provider concered (Comment)     Advance Directives (For Healthcare) Advance Directive: Patient does not have advance directive;Patient would not like information Pre-existing out of facility  DNR order (yellow form or pink MOST form): No Nutrition Screen- [facility] Adult/[facility]/AP Patient's home diet: Regular Have you recently lost weight without trying?: Yes If yes, how much weight have you lost?: 24-33 lb Have you been eating poorly because of a decreased appetite?: No Malnutrition Screening Tool Score: 3   Additional Information 1:1 In Past 12 Months?: No CIRT Risk: No Elopement Risk: No Does patient have medical clearance?: No     Disposition: Direct Admit by Dr. Lolly Mustache, run by Dr. Allena Katz, to Dr. Orson Aloe Disposition Disposition of Patient: Inpatient treatment program Type of inpatient treatment program: Adult  On Site Evaluation by:   Reviewed with Physician:     Derrill Kay,  Rueben Bash 02/26/2012 10:09 PM
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
## 2012-02-26 NOTE — Progress Notes (Signed)
PHARMACIST - PHYSICIAN COMMUNICATION CONCERNING: Pharmacy Care Issues Regarding Warfarin Labs  RECOMMENDATION (Action Taken): A baseline and daily protime for three days has been ordered to meet the [facility] National Patient safety goal and comply with the current [facility] policy.   The Pharmacy will defer all warfarin dose order changes and follow up of lab results to the prescriber unless an additional order to initiate a "pharmacy Coumadin consult" is placed.  DESCRIPTION:  While hospitalized, to be in compliance with [employer] National Patient Safety Goals, all patients on warfarin must have a baseline and/or current protime prior to the administration of warfarin. Pharmacy has received your order for warfarin without these required laboratory assessments.   

## 2012-02-27 DIAGNOSIS — F332 Major depressive disorder, recurrent severe without psychotic features: Principal | ICD-10-CM

## 2012-02-27 DIAGNOSIS — E876 Hypokalemia: Secondary | ICD-10-CM

## 2012-02-27 LAB — URINALYSIS, ROUTINE W REFLEX MICROSCOPIC
Glucose, UA: NEGATIVE mg/dL
Hgb urine dipstick: NEGATIVE
Ketones, ur: NEGATIVE mg/dL
Specific Gravity, Urine: 1.024 (ref 1.005–1.030)
Urobilinogen, UA: 0.2 mg/dL (ref 0.0–1.0)

## 2012-02-27 LAB — COMPREHENSIVE METABOLIC PANEL
ALT: 37 U/L — ABNORMAL HIGH (ref 0–35)
Alkaline Phosphatase: 78 U/L (ref 39–117)
BUN: 7 mg/dL (ref 6–23)
CO2: 20 mEq/L (ref 19–32)
Calcium: 9.1 mg/dL (ref 8.4–10.5)
Creatinine, Ser: 0.68 mg/dL (ref 0.50–1.10)
GFR calc Af Amer: >90 mL/min (ref >90)
GFR calc non Af Amer: >90 mL/min (ref >90)
Glucose, Bld: 102 mg/dL — ABNORMAL HIGH (ref 70–99)

## 2012-02-27 LAB — CBC
HCT: 42.5 % (ref 36.0–46.0)
Hemoglobin: 14.2 g/dL (ref 12.0–15.0)
MCH: 29.6 pg (ref 26.0–34.0)
MCHC: 33.4 g/dL (ref 30.0–36.0)
MCV: 88.7 fL (ref 78.0–100.0)
RBC: 4.79 MIL/uL (ref 3.87–5.11)

## 2012-02-27 LAB — TSH: TSH: 3.804 u[IU]/mL (ref 0.350–4.500)

## 2012-02-27 LAB — T3, FREE: T3, Free: 2.7 pg/mL (ref 2.3–4.2)

## 2012-02-27 LAB — PROTIME-INR
INR: 1.27 (ref 0.00–1.49)
Prothrombin Time: 15.6 seconds — ABNORMAL HIGH (ref 11.6–15.2)

## 2012-02-27 LAB — T4, FREE: Free T4: 0.9 ng/dL (ref 0.80–1.80)

## 2012-02-27 LAB — LIPID PANEL
Cholesterol: 192 mg/dL (ref 0–200)
HDL: 63 mg/dL (ref >39)
Triglycerides: 442 mg/dL — ABNORMAL HIGH (ref <150)

## 2012-02-27 MED ORDER — OLANZAPINE-FLUOXETINE HCL 12-50 MG PO CAPS
1.0000 | ORAL_CAPSULE | Freq: Every day | ORAL | Status: DC
  Administered 2012-02-27: 1 via ORAL
  Filled 2012-02-27 (×3): qty 1

## 2012-02-27 MED ORDER — AMITRIPTYLINE HCL 25 MG PO TABS
25.0000 mg | ORAL_TABLET | Freq: Every evening | ORAL | Status: DC | PRN
  Administered 2012-02-27 (×2): 25 mg via ORAL
  Filled 2012-02-27 (×6): qty 1

## 2012-02-27 MED ORDER — LOPERAMIDE HCL 2 MG PO CAPS
4.0000 mg | ORAL_CAPSULE | Freq: Once | ORAL | Status: AC
  Administered 2012-02-27: 4 mg via ORAL
  Filled 2012-02-27: qty 2

## 2012-02-27 MED ORDER — POTASSIUM CHLORIDE CRYS ER 10 MEQ PO TBCR
10.0000 meq | EXTENDED_RELEASE_TABLET | Freq: Two times a day (BID) | ORAL | Status: DC
  Administered 2012-02-27 – 2012-02-28 (×3): 10 meq via ORAL
  Filled 2012-02-27 (×6): qty 1

## 2012-02-27 MED ORDER — VERAPAMIL HCL 120 MG PO TABS
240.0000 mg | ORAL_TABLET | Freq: Every day | ORAL | Status: DC
  Administered 2012-02-27: 240 mg via ORAL
  Filled 2012-02-27 (×3): qty 2

## 2012-02-27 MED ORDER — BOOST / RESOURCE BREEZE PO LIQD
1.0000 | Freq: Three times a day (TID) | ORAL | Status: DC
  Administered 2012-02-28: 1 via ORAL
  Filled 2012-02-27 (×9): qty 1
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
## 2012-02-27 NOTE — Progress Notes (Signed)
D: Patient denies SI/HI/AVH. Patient rates hopelessness as 8,  depression as 8 , and anxiety as 6.  She attributes this to her chronic medical issues. Patient affect is sad. Mood is depressed.  Patient did attend evening group. Patient visible on the milieu. No distress noted. A: Support and encouragement offered. Scheduled medications given to pt. Q 15 min checks continued for patient safety. R: Patient receptive. Patient remains safe on the unit.

## 2012-02-27 NOTE — Progress Notes (Signed)
D: Patient denies SI. Patient has an anxious mood and affect. The patient rates her depression an 8 out of 10 and her hopelessness a 10 out of 10 (1 low/10 high). The patient reports sleeping poorly and states that her appetite and energy level are low. The patient has had multiple medication concerns and has even stopped the pharmacist in the hallway to discuss these issues. The patient is attending groups and is interacting appropriately within the milieu.  A: Patient given emotional support from RN. Patient encouraged to come to staff with concerns and/or questions. Patient's medication routine continued. Patient's orders and plan of care reviewed. Patient was referred to MD about medication concerns. Patient was given education regarding the policies and procedures related to this issue.  R: Patient remains appropriate and cooperative but is still somewhat anxious. Will continue to monitor patient q15 minutes for safety.

## 2012-02-27 NOTE — Progress Notes (Signed)
Psychoeducational Group Note  Date:  02/27/2012 Time:  2000  Group Topic/Focus:  Wrap-Up Group:   The focus of this group is to help patients review their daily goal of treatment and discuss progress on daily workbooks.  Participation Level:  Active  Participation Quality:  Appropriate  Affect:  Appropriate  Cognitive:  Alert  Insight:  Good  Engagement in Group:  Good  Additional Comments:  Pt stated that today has been frustrating for her because she feels that her medical problems are a bigger issue than her depression.  Kaleen Odea R 02/27/2012, 9:12 PM

## 2012-02-27 NOTE — Progress Notes (Signed)
Psychoeducational Group Note  Date:  02/27/2012 Time:  1100  Group Topic/Focus:  Rediscovering Joy:   The focus of this group is to explore various ways to relieve stress in a positive manner.  Participation Level:  Active  Participation Quality:  Appropriate, Attentive and Sharing  Affect:  Appropriate  Cognitive:  Appropriate  Insight:  Good  Engagement in Group:  Limited  Additional Comments:  Pt attended and participated in Rediscovering joy. Pt stated ways humor and laughter affected the body and mind. Pt stated different ways humor, laughter and joy can be positive in activities within daily tasks. Pt also stated within the five senses things that joy apply in their life from the senses. Pt was appropriate and shared.   Karleen Hampshire Brittini 02/27/2012, 1:21 PM

## 2012-02-27 NOTE — Progress Notes (Signed)
Patient seen during during d/c planning group and or treatment team.  She reports having a suicide attempt.  She reports admitting to the hospital to stabilize on medications.  Patient states she has a lot of chronic medical issues that have resulted in multiple hospitalizations.  She reports having home, transportation, and access to medications.  She is followed outpatient by [facility] in [place].  Patient advised that her medical problems are causing problems in her marriage.

## 2012-02-27 NOTE — [facility] Suicide Risk Assessment (Signed)
Suicide Risk Assessment. Diagnosis:  Axis I: Major Depression, Recurrent severe  Axis II: Deferred  Axis III:  Past Medical History   Diagnosis  Date   .  HTN (hypertension)    .  Fatty liver    .  GERD (gastroesophageal reflux disease)    .  Hypercholesterolemia    .  Anastomotic ulcer      multiple   .  S/P endoscopy      last done Oct 2010, multiple, usually emergent due to anastomotic strictures   .  S/P colonoscopy  08/2010     normal.   .  Dumping syndrome    .  Blood clot due to device, implant, or graft      PICC Line    Axis IV: other psychosocial or environmental problems and problems related to social environment  Axis V: 21-30 behavior considerably influenced by delusions or hallucinations OR serious impairment in judgment, communication OR inability to function in almost all areas  ADL's: Intact  Sleep: Poor  Appetite: Poor  Suicidal Ideation:  Plan: No  Intent: No  Means: No  Homicidal Ideation:  Plan: No  Intent: No  Means: No  AEB (as evidenced by):  Mental Status Examination/Evaluation:  Objective: Appearance: Casual   Eye Contact:: Good   Speech: Clear and Coherent   Volume: Normal   Mood: Anxious, Depressed, Hopeless, Irritable and Worthless   Affect: Congruent   Thought Process: Coherent   Orientation: Full   Thought Content: WDL   Suicidal Thoughts: Yes. without intent/plan   Homicidal Thoughts: No   Memory: Immediate; Fair  Recent; Fair  Remote; Fair   Judgement: Fair   Insight: Fair   Psychomotor Activity: Normal   Concentration: Fair   Recall: Fair   Akathisia: No   Handed: Right   AIMS (if indicated):   Assets: Communication Skills  Desire for Improvement   Sleep: Number of Hours: 4.5   Vital Signs:Blood pressure 138/85, pulse 112, temperature 97.9 F (36.6 C), temperature source Oral, resp. rate 16, height 5\' 4"  (1.626 m), weight 101.152 kg (223 lb), last menstrual period 12/25/2011.   Current Medications:  Current  facility-administered medications:acetaminophen (TYLENOL) tablet 650 mg, 650 mg, Oral, Q6H PRN, Curlene Labrum Readling, MD;  amitriptyline (ELAVIL) tablet 25 mg, 25 mg, Oral, QHS,MR X 1, Mike Craze, MD;  calcium-vitamin D (OSCAL WITH D) 500-200 MG-UNIT per tablet 1 tablet, 1 tablet, Oral, Daily, Curlene Labrum Readling, MD, 1 tablet at 02/27/12 1610 clomiPRAMINE (ANAFRANIL) capsule 50 mg, 50 mg, Oral, QHS, Curlene Labrum Readling, MD, 50 mg at 02/26/12 2233;  colesevelam [facility]) tablet 1,875 mg, 1,875 mg, Oral, BID WC, Curlene Labrum Readling, MD, 1,875 mg at 02/27/12 1626;  enoxaparin (LOVENOX) injection 100 mg, 100 mg, Subcutaneous, Q12H, Curlene Labrum Readling, MD, 100 mg at 02/27/12 1057;  feeding supplement (RESOURCE BREEZE) liquid 1 Container, 1 Container, Oral, TID BM, Anastasio Champion, RD FLUoxetine (PROZAC) capsule 20 mg, 20 mg, Oral, Once, Curlene Labrum Readling, MD, 20 mg at 02/26/12 2325;  gabapentin (NEURONTIN) tablet 1,800 mg, 1,800 mg, Oral, QHS, Randy D Readling, MD, 1,800 mg at 02/26/12 2233;  gabapentin (NEURONTIN) tablet 600 mg, 600 mg, Oral, Daily, Curlene Labrum Readling, MD, 600 mg at 02/27/12 0827;  hydrOXYzine (ATARAX/VISTARIL) tablet 50 mg, 50 mg, Oral, QHS,MR X 1, Randy D Readling, MD, 50 mg at 02/27/12 0020 loperamide (IMODIUM) capsule 4 mg, 4 mg, Oral, Once, Mike Craze, MD, 4 mg at 02/27/12 1247;  magnesium hydroxide (MILK  OF MAGNESIA) suspension 30 mL, 30 mL, Oral, Daily PRN, Curlene Labrum Readling, MD;  olanzapine-FLUoxetine (SYMBYAX) 12-50 MG per capsule 1 capsule, 1 capsule, Oral, QHS, Mike Craze, MD;  olanzapine-FLUoxetine (SYMBYAX) 6-25 MG per capsule 1 capsule, 1 capsule, Oral, Once, Curlene Labrum Readling, MD, 1 capsule at 02/26/12 2325 ondansetron (ZOFRAN) tablet 8 mg, 8 mg, Oral, Q8H PRN, Curlene Labrum Readling, MD;  pantoprazole (PROTONIX) EC tablet 40 mg, 40 mg, Oral, BID AC, Curlene Labrum Readling, MD, 40 mg at 02/27/12 1625;  potassium chloride (K-DUR,KLOR-CON) CR tablet 10 mEq, 10 mEq, Oral, BID, Norval Gable, NP, 10  mEq at 02/27/12 1625;  prenatal vitamin w/FE, FA (NATACHEW) chewable tablet 1 tablet, 1 tablet, Oral, Daily, Ronny Bacon, MD, 1 tablet at 02/27/12 0827 promethazine (PHENERGAN) tablet 25 mg, 25 mg, Oral, Q6H PRN, Curlene Labrum Readling, MD, 25 mg at 02/27/12 1625;  sucralfate (CARAFATE) tablet 1 g, 1 g, Oral, QID, Curlene Labrum Readling, MD, 1 g at 02/27/12 1625;  tiZANidine (ZANAFLEX) tablet 6 mg, 6 mg, Oral, TID, Curlene Labrum Readling, MD, 6 mg at 02/27/12 1625;  tiZANidine (ZANAFLEX) tablet 6 mg, 6 mg, Oral, Once, Curlene Labrum Readling, MD, 6 mg at 02/26/12 2325 verapamil (CALAN) tablet 240 mg, 240 mg, Oral, Daily, Mike Craze, MD;  verapamil (CALAN) tablet 80 mg, 80 mg, Oral, Once, Ronny Bacon, MD, 80 mg at 02/26/12 2324;  DISCONTD: LORazepam (ATIVAN) tablet 1 mg, 1 mg, Oral, TID AC, Curlene Labrum Readling, MD, 1 mg at 02/27/12 1201;  DISCONTD: olanzapine-FLUoxetine (SYMBYAX) 6-50 MG per capsule 1 capsule, 1 capsule, Oral, QHS, Ronny Bacon, MD DISCONTD: verapamil (CALAN) tablet 240 mg, 240 mg, Oral, Daily, Curlene Labrum Readling, MD, 240 mg at 02/27/12 0827;  DISCONTD: warfarin (COUMADIN) tablet 5 mg, 5 mg, Oral, QPC breakfast, Ronny Bacon, MD;  DISCONTD: zolpidem (AMBIEN) tablet 5 mg, 5 mg, Oral, QHS PRN, Ronny Bacon, MD  Labs:  Results for orders placed during the hospital encounter of 02/26/12 (from the past 72 hour(s))  PREGNANCY, URINE     Status: Normal   Collection Time   02/26/12 11:10 PM      Component Value Range Comment   Preg Test, Ur NEGATIVE  NEGATIVE   URINALYSIS, ROUTINE W REFLEX MICROSCOPIC     Status: Abnormal   Collection Time   02/26/12 11:10 PM      Component Value Range Comment   Color, Urine YELLOW  YELLOW    APPearance CLOUDY (*) CLEAR    Specific Gravity, Urine 1.024  1.005 - 1.030    pH 5.5  5.0 - 8.0    Glucose, UA NEGATIVE  NEGATIVE mg/dL    Hgb urine dipstick NEGATIVE  NEGATIVE    Bilirubin Urine NEGATIVE  NEGATIVE    Ketones, ur NEGATIVE  NEGATIVE mg/dL    Protein,  ur NEGATIVE  NEGATIVE mg/dL    Urobilinogen, UA 0.2  0.0 - 1.0 mg/dL    Nitrite NEGATIVE  NEGATIVE    Leukocytes, UA NEGATIVE  NEGATIVE MICROSCOPIC NOT DONE ON URINES WITH NEGATIVE PROTEIN, BLOOD, LEUKOCYTES, NITRITE, OR GLUCOSE <1000 mg/dL.  CBC     Status: Normal   Collection Time   02/27/12  6:15 AM      Component Value Range Comment   WBC 8.3  4.0 - 10.5 K/uL    RBC 4.79  3.87 - 5.11 MIL/uL    Hemoglobin 14.2  12.0 - 15.0 g/dL    HCT 16.1  09.6 - 04.5 %  MCV 88.7  78.0 - 100.0 fL    MCH 29.6  26.0 - 34.0 pg    MCHC 33.4  30.0 - 36.0 g/dL    RDW 40.9  81.1 - 91.4 %    Platelets 287  150 - 400 K/uL   COMPREHENSIVE METABOLIC PANEL     Status: Abnormal   Collection Time   02/27/12  6:15 AM      Component Value Range Comment   Sodium 138  135 - 145 mEq/L    Potassium 3.2 (*) 3.5 - 5.1 mEq/L    Chloride 103  96 - 112 mEq/L    CO2 20  19 - 32 mEq/L    Glucose, Bld 102 (*) 70 - 99 mg/dL    BUN 7  6 - 23 mg/dL    Creatinine, Ser 7.82  0.50 - 1.10 mg/dL    Calcium 9.1  8.4 - 95.6 mg/dL    Total Protein 6.9  6.0 - 8.3 g/dL    Albumin 3.6  3.5 - 5.2 g/dL    AST 29  0 - 37 U/L    ALT 37 (*) 0 - 35 U/L    Alkaline Phosphatase 78  39 - 117 U/L    Total Bilirubin 0.4  0.3 - 1.2 mg/dL    GFR calc non Af Amer >90  >90 mL/min    GFR calc Af Amer >90  >90 mL/min   LIPID PANEL     Status: Abnormal   Collection Time   02/27/12  6:15 AM      Component Value Range Comment   Cholesterol 192  0 - 200 mg/dL    Triglycerides 213 (*) <150 mg/dL    HDL 63  >08 mg/dL    Total CHOL/HDL Ratio 3.0      VLDL UNABLE TO CALCULATE IF TRIGLYCERIDE OVER 400 mg/dL  0 - 40 mg/dL    LDL Cholesterol UNABLE TO CALCULATE IF TRIGLYCERIDE OVER 400 mg/dL  0 - 99 mg/dL   TSH     Status: Normal   Collection Time   02/27/12  6:15 AM      Component Value Range Comment   TSH 3.804  0.350 - 4.500 uIU/mL   PROTIME-INR     Status: Abnormal   Collection Time   02/27/12  6:15 AM      Component Value Range Comment    Prothrombin Time 15.6 (*) 11.6 - 15.2 seconds    INR 1.27  0.00 - 1.49   T3, FREE     Status: Normal   Collection Time   02/27/12  6:15 AM      Component Value Range Comment   T3, Free 2.7  2.3 - 4.2 pg/mL   T4, FREE     Status: Normal   Collection Time   02/27/12  6:15 AM      Component Value Range Comment   Free T4 0.90  0.80 - 1.80 ng/dL     Physical Findings:  AIMS: Facial and Oral Movements  Muscles of Facial Expression: None, normal  Lips and Perioral Area: None, normal  Jaw: None, normal  Tongue: None, normal,Extremity Movements  Upper (arms, wrists, hands, fingers): None, normal  Lower (legs, knees, ankles, toes): None, normal, Trunk Movements  Neck, shoulders, hips: None, normal, Overall Severity  Severity of abnormal movements (highest score from questions above): None, normal  Incapacitation due to abnormal movements: None, normal  Patient's awareness of abnormal movements (rate only patient's report): No Awareness, Dental Status  Current problems with teeth and/or dentures?: No  Does patient usually wear dentures?: No  CIWA: CIWA-Ar Total: 12  COWS: COWS Total Score: 6  Treatment Plan Summary:  Daily contact with patient to assess and evaluate symptoms and progress in treatment  Medication management  Plan:  Admit, add Elavil for sedation and control of pain.  Increase Zyprexa according to the outpatient psychiatrist.   Discussed the risks, benefits, and probable clinical course with and without treatment.  Pt is agreeable to the current course of treatment. Dan Humphreys, Jonasia Coiner 02/27/2012 5:13 PM

## 2012-02-27 NOTE — Tx Team (Signed)
Interdisciplinary Treatment Plan Update (Adult)  Date:  02/27/2012  Time Reviewed:  9:45 AM   Progress in Treatment: Attending groups: Yes Participating in groups:  Yes Taking medication as prescribed:  Yes Tolerating medication: Yes Family/Significant othe contact made:  Consent given to contact husband Patient understands diagnosis: Yes Discussing patient identified problems/goals with staff:  Yes Medical problems stabilized or resolved: Yes Denies suicidal/homicidal ideation: Yes Issues/concerns per patient self-inventory:  No  Other:  New problem(s) identified: None  Reason for Continuation of Hospitalization: Anxiety Depression Medication stabilization Other; describe Hopelessness  Interventions implemented related to continuation of hospitalization:  Medication stabilization, safety checks q 15 mins, group attendance  Additional comments:  Estimated length of stay:  3-5 days  Discharge Plan:  Brailyn will discharge home and follow up with Dr. Lolly Mustache & Florencia Reasons at [facility]  New goal(s):  Review of initial/current patient goals per problem list:   1.  Goal(s): Decrease rating of depressive symptoms to rating of 4 or less  Met:  No  Target date: by discharge  As evidenced by: Sharyl Nimrod rates depression at 9  2.  Goal (s): Decrease rating of anxiety symptoms to rating of 4 or less  Met:  No  Target date: by discharge  As evidenced by: Sharyl Nimrod rates anxiety at 7  3.  Goal(s): Reduce rating of hopelessness to 4 or less  Met:  No  Target date: by discharge  As evidenced by: Sharyl Nimrod reports hopelessness at 10 today  4.  Goal(s): Reduce potential for suicide/self-harm  Met:  Yes  Target date: by discharge  As evidenced by: Sharyl Nimrod denies any suicidal thoughts today.  Attendees: Patient:   02/27/2012 9:45 AM  Family:     Physician:  Dr Orson Aloe, MD 02/27/2012 9:45 AM  Nursing:   Berneice Heinrich, RN  02/27/2012 9:45 AM  Case  Manager:  Juline Patch, LCSW 02/27/2012 9:45 AM  Counselor:  Angus Palms, LCSW 02/27/2012 9:45 AM  Other:  Janetta Hora, RN 02/27/2012 9:45 AM  Other:  [place] Williamson, Nursing Student 02/27/2012 9:45 AM  Other:     Other:      Scribe for Treatment Team:   [name], 02/27/2012 9:45 AM

## 2012-02-27 NOTE — Progress Notes (Signed)
ANTICOAGULATION CONSULT NOTE - Initial Consult  Pharmacy Consult for Lovenox/Coumadin Indication: DVT  Allergies  Allergen Reactions  . Cephalexin   . Cephalexin   . Codeine   . Skin Comfort (Alum Sulfate-Ca Acetate)     Patient Measurements: Height: 5\' 4"  (162.6 cm) Weight: 212 lb (96.163 kg) IBW/kg (Calculated) : 54.7    Vital Signs: Temp: 98 F (36.7 C) (09/25 0600) BP: 138/97 mmHg (09/25 0601) Pulse Rate: 101  (09/25 0601)  Labs:  Basename 02/27/12 0615  HGB 14.2  HCT 42.5  PLT 287  APTT --  LABPROT 15.6*  INR 1.27  HEPARINUNFRC --  CREATININE 0.68  CKTOTAL --  CKMB --  TROPONINI --    Estimated Creatinine Clearance: 115.7 ml/min (by C-G formula based on Cr of 0.68).   Medical History: Past Medical History  Diagnosis Date  . HTN (hypertension)   . Fatty liver   . GERD (gastroesophageal reflux disease)   . Hypercholesterolemia   . Anastomotic ulcer     multiple  . S/P endoscopy     last done Oct 2010, multiple, usually emergent due to anastomotic strictures  . S/P colonoscopy 08/2010    normal.  . Dumping syndrome   . Blood clot due to device, implant, or graft     PICC Line    Medications:  Scheduled:    . calcium-vitamin D  1 tablet Oral Daily  . clomiPRAMINE  50 mg Oral QHS  . colesevelam  1,875 mg Oral BID WC  . enoxaparin (LOVENOX) injection  100 mg Subcutaneous Q12H  . FLUoxetine  20 mg Oral Once  . gabapentin  1,800 mg Oral QHS  . gabapentin  600 mg Oral Daily  . hydrOXYzine  50 mg Oral QHS,MR X 1  . loperamide  4 mg Oral Once  . LORazepam  1 mg Oral TID AC  . olanzapine-FLUoxetine  1 capsule Oral Once  . olanzapine-FLUoxetine  1 capsule Oral QHS  . pantoprazole  40 mg Oral BID AC  . potassium chloride  10 mEq Oral BID  . prenatal vitamin w/FE, FA  1 tablet Oral Daily  . sucralfate  1 g Oral QID  . tiZANidine  6 mg Oral TID  . tiZANidine  6 mg Oral Once  . verapamil  240 mg Oral Daily  . verapamil  80 mg Oral Once  .  DISCONTD: warfarin  5 mg Oral QPC breakfast    Assessment/Plan Patient has Surgery scheduled and stopped Coumadin on Sept 24 and started lovenox 100 mg sq q12 for h/o dvt.  Will continue lovenox during hospital stay. No Coumadin.  Will monitor for complications.        Charyl Dancer 02/27/2012,12:44 PM

## 2012-02-27 NOTE — Progress Notes (Signed)
Adult Psychosocial Assessment Update Interdisciplinary Team  [facility] admissions/discharges:  Admissions Discharges  Date:    01/01/2012 Date:     01/04/2012  Date: Date:  Date: Date:  Date: Date:  Date: Date:   Changes since the last Psychosocial Assessment (including adherence to outpatient mental health and/or substance abuse treatment, situational issues contributing to decompensation and/or relapse). [name] believes her medications need adjusting. She has been in [place] for 3 weeks  Due to medical problems and the medications they put her on "messed up" her meds for  Depression and anxiety. After getting a bad report from her doctor last week she was   Feeling very depressed and went home where she took pills in an overdose attempt. She  Reports that her husband is exhausted by trying to take care of her through all of her   Hospitalizations (5 this year that have been over a week so far) and this has begun to cause problems between the two of them.   Discharge Plan 1. Will you be returning to the same living situation after discharge?   Yes:    X No:      If no, what is your plan?  Will return to the home with her husband         2. Would you like a referral for services when you are discharged? Yes:     If yes, for what services?  No:    X  [name] has been seeing Dr. Lolly Mustache for medications and Florencia Reasons for therapy in   [facility] [place] office of [facility] and will return there.       Summary and Recommendations (to be completed by the evaluator) [name] is a 31 year old married female diagnosed with Major Depressive Disorder. She  Learned recently that there are no more medical interventions to be taken for the   Dumping syndrome which she struggles with. After learning this she went home and   Overdosed on her medications. She is still feeling hopeless although she denies any   Thoughts of suicide at this time. There have  recently been problems in the marriage due  To her multiple hospitalizations and husband feeling pressured to take care of her and   Everything else in the household. Unkown would benefit from crisis stabilization,  Medication evaluation, therapy groups for processing thoughts/feeling/experiences,   psychoed groups for coping skills and case management for discharge planning.       Signature:  [name], 02/27/2012 8:39 AM

## 2012-02-27 NOTE — Tx Team (Signed)
Initial Interdisciplinary Treatment Plan  PATIENT STRENGTHS: (choose at least two) Ability for insight Average or above average intelligence Capable of independent living General fund of knowledge Motivation for treatment/growth Supportive family/friends  PATIENT STRESSORS: Financial difficulties Health problems   PROBLEM LIST: Problem List/Patient Goals Date to be addressed Date deferred Reason deferred Estimated date of resolution  Depression      Risk for self harm      Declining health issues-dumping syndrome, DVT in arm                                           DISCHARGE CRITERIA:  Ability to meet basic life and health needs Improved stabilization in mood, thinking, and/or behavior Medical problems require only outpatient monitoring Motivation to continue treatment in a less acute level of care Verbal commitment to aftercare and medication compliance  PRELIMINARY DISCHARGE PLAN: Attend PHP/IOP Outpatient therapy Return to previous living arrangement  PATIENT/FAMIILY INVOLVEMENT: This treatment plan has been presented to and reviewed with the patient, [name], and/or family member.  The patient and family have been given the opportunity to ask questions and make suggestions.  Jesus Genera [facility] 02/27/2012, 12:37 AM

## 2012-02-27 NOTE — Progress Notes (Signed)
[facility] Group Notes: (Counselor/Nursing/MHT/Case Management/Adjunct) 02/27/2012   1:15-2:30pm Emotion Regulation  Type of Therapy:  Group Therapy  Participation Level:  Did not attend    Angus Palms, LCSW 02/27/2012  2:51 PM

## 2012-02-27 NOTE — H&P (Signed)
Medical/psychiatric screening examination/treatment/procedure(s) were performed by non-physician practitioner and as supervising physician I was immediately available for consultation/collaboration.  I have seen and examined this patient and agree with the major elements of this evaluation.  

## 2012-02-27 NOTE — Progress Notes (Signed)
[facility] Group Notes:  (Counselor/Nursing/MHT/Case Management/Adjunct)  02/27/2012 12:20 PM  Type of Therapy:  Psychoeducational Skills  Participation Level:  Active  Participation Quality:  Appropriate, Attentive and Sharing  Affect:  Appropriate  Cognitive:  Alert, Appropriate and Oriented  Insight:  Good  Engagement in Group:  Good  Engagement in Therapy:  n/a  Modes of Intervention:  Activity, Education, Problem-solving, Socialization and Support  Summary of Progress/Problems: [name] attended Psychoeducational group that focused on using quality time with support systems/individuals to engage in healthy coping skills. [name] participated in activity guessing about self and peers. [name] was active while group discussed who their supports are, how they can spend positive quality time with them as a coping skills and a way to strengthen their relationship. [name] was given a homework assignment to find two ways to improve their support systems and twenty activities they can do to spend quality time with their supports.     [name] 02/27/2012, 12:20 PM

## 2012-02-27 NOTE — Progress Notes (Signed)
Vol admit to the 500 hall after being referred by Dr Lolly Mustache following her appt with him today.  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  She told him it was not a suicide attempt, but she just wanted to sleep.  She says she has not slept in days.  After she took the pills, she called her sister who called her mother and husband.  She was not treated for the OD.  She says the doctors at [facility] told her that there was not much else they could do for her.  She is on numerous medications.  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  She also has HTN, GERD, fatty liver, hx multiple ulcers, (3) knee surgeries.  She also is on Warfarin therapy for a DVT to her R upper arm.  She is scheduled for a GI procedure on Oct 1.  She is on disability and her husband is supportive.  Pt was last here at the end of July.  Pt presented as very flat/depressed.  Admission/paperwork completed.  Pt re-oriented to the unit.  Safety checks q15 minutes.

## 2012-02-27 NOTE — Plan of Care (Signed)
Problem: Food- and Nutrition-Related Knowledge Deficit (NB-1.1) Goal: Nutrition education Formal process to instruct or train a patient/client in a skill or to impart knowledge to help patients/clients voluntarily manage or modify food choices and eating behavior to maintain or improve health.  Outcome: Completed/Met Date Met:  02/27/12 Knowledge deficit resolved 9/25 with diet education.   Comments:  I have educated the patient on nutrition therapy for dumping syndrome. She reported she knows what to eat.  She reported she also found out she has a problem with her esophagus that causes her to need to eat soft foods. I have also educated the patient on good sources of protein. I have encouraged the patient to try to incorporate protein sources into her diet. She reported she drinks resource breeze and carnation instant breakfast at home. The patient reported she eats snacks throughout the day of apple sauce and pudding. I have encouraged the patient to eat small frequent meals throughout the day low in fat and non concentrated sweets. I have also encouraged the patient to put foods into a food processor to increase the nutritional quality of her intake. The patient was without any nutrition related questions. She verbalized understanding of the nutrition information provided.   Will order patient Resource Breeze TID, provides 750 kcal and 27 grams of protein daily.   RD available for nutrition needs.   Iven Finn [facility] [phone]

## 2012-02-27 NOTE — Progress Notes (Signed)
02/27/2012      Time: 1500      Group Topic/Focus: The focus of this group is on discussing various styles of communication and communicating assertively using 'I' (feeling) statements.  Participation Level: Active  Participation Quality: Attentive  Affect: Appropriate  Cognitive: Oriented   Additional Comments: None.  Latrisha Coiro 02/27/2012 3:51 PM  

## 2012-02-28 ENCOUNTER — Ambulatory Visit (HOSPITAL_COMMUNITY): Payer: Self-pay | Admitting: Psychiatry

## 2012-02-28 DIAGNOSIS — F332 Major depressive disorder, recurrent severe without psychotic features: Secondary | ICD-10-CM | POA: Diagnosis not present

## 2012-02-28 MED ORDER — VERAPAMIL HCL ER 240 MG PO CP24: 240.0000 mg | ORAL_CAPSULE | Freq: Every day | ORAL | Status: DC

## 2012-02-28 MED ORDER — ENOXAPARIN SODIUM 100 MG/ML ~~LOC~~ SOLN: 100.0000 mg | Freq: Two times a day (BID) | SUBCUTANEOUS | Status: DC

## 2012-02-28 MED ORDER — AMITRIPTYLINE HCL 25 MG PO TABS: 25.0000 mg | ORAL_TABLET | Freq: Every day | ORAL | Status: DC

## 2012-02-28 MED ORDER — PANTOPRAZOLE SODIUM 40 MG PO TBEC: 40.0000 mg | DELAYED_RELEASE_TABLET | Freq: Two times a day (BID) | ORAL | Status: DC

## 2012-02-28 MED ORDER — CLOMIPRAMINE HCL 50 MG PO CAPS: 50.0000 mg | ORAL_CAPSULE | Freq: Every day | ORAL | Status: DC

## 2012-02-28 MED ORDER — GABAPENTIN 300 MG PO CAPS: 300.0000 mg | ORAL_CAPSULE | Freq: Three times a day (TID) | ORAL | Status: DC

## 2012-02-28 MED ORDER — SUCRALFATE 1 G PO TABS: 1.0000 g | ORAL_TABLET | Freq: Four times a day (QID) | ORAL | Status: DC

## 2012-02-28 MED ORDER — HYDROXYZINE HCL 50 MG PO TABS: 50.0000 mg | ORAL_TABLET | Freq: Three times a day (TID) | ORAL | Status: DC

## 2012-02-28 MED ORDER — ALIGN 4 MG PO CAPS: 4.0000 mg | ORAL_CAPSULE | Freq: Every day | ORAL | Status: DC
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
## 2012-02-28 NOTE — Progress Notes (Addendum)
Patient resting ,snoring, her eyes closed. Although no distress noted. Q 15 minute check continues to maintain safety   0705 am: Patient blood pressure elevated this morning. Patient already left the unit before writer saw the B/P. Writer will report to incoming RN to follow up with the blood pressure.

## 2012-02-28 NOTE — Progress Notes (Signed)
[facility] Adult Inpatient Family/Significant Other Suicide Prevention Education  Suicide Prevention Education:  Education Completed; [name], husband, [phone])  has been identified by the patient as the family member/significant other with whom the patient will be residing, and identified as the person(s) who will aid the patient in the event of a mental health crisis (suicidal ideations/suicide attempt).  With written consent from the patient, the family member/significant other has been provided the following suicide prevention education, prior to the and/or following the discharge of the patient.  The suicide prevention education provided includes the following:  Suicide risk factors  Suicide prevention and interventions  National Suicide Hotline telephone number  [facility] assessment telephone number  [facility] Emergency Assistance 911  [facility] and/or Residential Mobile Crisis Unit telephone number  Request made of family/significant other to:  Remove weapons (e.g., guns, rifles, knives), all items previously/currently identified as safety concern.    Remove drugs/medications (over-the-counter, prescriptions, illicit drugs), all items previously/currently identified as a safety concern.  [name] reported that he does not have safety concerns regarding [name] and that he agrees with the plan for her to discharge home today. He indicated that had no questions or concerns about her treatment, although Shareka had stated that she thought he needed help understanding how to support her. [name] verbalized understanding of suicide prevention information and stated that his guns are locked in a safe where [place] cannot access them.   [name] 02/28/2012, 4:01 PM

## 2012-02-28 NOTE — Progress Notes (Signed)
Pt discharged per MD orders; pt currently denies SI/HI and auditory/visual hallucinations; pt was given education by RN regarding follow-up appointments and medications and pt denied any questions or concerns about these instructions; pt was then escorted to search room to retrieve her belongings by RN before being discharged to hospital lobby. 

## 2012-02-28 NOTE — Tx Team (Signed)
Interdisciplinary Treatment Plan Update (Adult)  Date:  02/28/2012  Time Reviewed:  9:56 AM   Progress in Treatment: Attending groups:   Yes   Participating in groups:  Yes Taking medication as prescribed:  Yes Tolerating medication:  Yes Family/Significant othe contact made: Counselor to make contact with husband Patient understands diagnosis:  Yes Discussing patient identified problems/goals with staff: Yes Medical problems stabilized or resolved: Yes Denies suicidal/homicidal ideation:Yes Issues/concerns per patient self-inventory:  Other:  New problem(s) identified:  Reason for Continuation of Hospitalization:  Interventions implemented related to continuation of hospitalization:  Additional comments:  Estimated length of stay: Discharge today  Discharge Plan: Home with husband and follow up with outpatient providers  New goal(s):  Review of initial/current patient goals per problem list:    1.  Goal(s): Eliminate SI/other thoughts of self harm   Met:  Yes  Target date: d/c  As evidenced by: Patient no longer endorsing SI/HI or other thoughts of self harm.    2.  Goal (s): Reduce depression/anxiety (rated at seven and five)   Met:  Yes  Target date: d/c  As evidenced by:  Patient reports being at her baseline  3.  Goal(s): .stabilize on meds   Met:  Yes  Target date: d/c  As evidenced by: Patient will report being stable on medications - symptoms have decreased    4.  Goal(s): Refer for outpatient follow up   Met:  Yes  Target date: d/c  As evidenced by:  Follow up appointment scheduled    Attendees: Patient:  [name] 02/28/2012 10:17 AM  Family:     Physician:  Orson Aloe, MD 02/28/2012 9:56 AM   Nursing:   Berneice Heinrich, RN 02/28/2012 9:56 AM   CaseManager:  Juline Patch, LCSW 02/28/2012 9:56 AM   Counselor:  Angus Palms, LCSW 02/28/2012 9:56 AM   Other:  Reyes Ivan, LCSWA     02/28/2012 10:19 AM Other:  Foye Clock, MSW Intern    02/28/2012 10:19 AM Other:  [place] Kirk, Nursing Student     02/28/2012 10:19 AM

## 2012-02-28 NOTE — Progress Notes (Signed)
Psychoeducational Group Note  Date:  02/28/2012 Time: 10:00  Group Topic/Focus:  Gratitude Journaling  Participation Level:  Active  Participation Quality:  Appropriate, Attentive and Sharing  Affect:  Appropriate  Cognitive:  Alert and Appropriate  Insight:  Good  Engagement in Group:  Good  Additional Comments:  Pt was observed writing enthusiastically in her Gratitude Journal.  She shared that she was thankful for her husband, Geologist, engineering, home, husband's job, her church, and her 2 "faux" children (her 2 puppies).  Gwyndolyn Kaufman 02/28/2012, 12:28 PM

## 2012-02-28 NOTE — Progress Notes (Signed)
[facility] Case Management Discharge Plan:  Will you be returning to the same living situation after discharge: Yes,  Patient will return to her home At discharge, do you have transportation home?:Yes,  Patient will arrange transportation home Do you have the ability to pay for your medications:Yes,  Patient can afford to get medications  Interagency Information:     Release of information consent forms completed and in the chart;  Patient's signature needed at discharge.  Patient to Follow up at:  Follow-up Information    Please follow up. (Patient has an appointment scheduled with Peggy on Tuesday October 8 )          Patient denies SI/HI:   Yes,  Patient denies SI/HI and other thoughts fo self harm    Safety Planning and Suicide Prevention discussed:  Yes,  Reviewed during aftercare group  Barrier to discharge identified:Yes,  Medical problems  Summary and Recommendations: Patient encouraged to be compliant with medications and follow up with outpatient recommendations    Wynn Banker 02/28/2012, 10:38 AM

## 2012-02-29 NOTE — Progress Notes (Signed)
Patient Discharge Instructions:  After Visit Summary (AVS):   Access to EMR:  02/29/2012 Psychiatric Admission Assessment Note:   Access to EMR:  02/29/2012 Suicide Risk Assessment - Discharge Assessment:   Access to EMR:  02/29/2012 Next Level Care Provider Has Access to the EMR, 02/29/2012  Records provided to [facility] via [facility]/Epic access.  Wandra Scot, 02/29/2012, 3:38 PM

## 2012-03-04 ENCOUNTER — Ambulatory Visit (HOSPITAL_COMMUNITY): Payer: Self-pay | Admitting: Psychiatry

## 2012-03-04 DIAGNOSIS — D649 Anemia, unspecified: Secondary | ICD-10-CM | POA: Diagnosis not present

## 2012-03-04 DIAGNOSIS — E669 Obesity, unspecified: Secondary | ICD-10-CM | POA: Diagnosis not present

## 2012-03-04 DIAGNOSIS — R51 Headache: Secondary | ICD-10-CM | POA: Diagnosis not present

## 2012-03-04 DIAGNOSIS — R197 Diarrhea, unspecified: Secondary | ICD-10-CM | POA: Diagnosis not present

## 2012-03-04 DIAGNOSIS — E860 Dehydration: Secondary | ICD-10-CM | POA: Diagnosis not present

## 2012-03-04 DIAGNOSIS — G8929 Other chronic pain: Secondary | ICD-10-CM | POA: Diagnosis not present

## 2012-03-04 DIAGNOSIS — K911 Postgastric surgery syndromes: Secondary | ICD-10-CM | POA: Diagnosis not present

## 2012-03-04 DIAGNOSIS — J309 Allergic rhinitis, unspecified: Secondary | ICD-10-CM | POA: Diagnosis not present

## 2012-03-04 DIAGNOSIS — F329 Major depressive disorder, single episode, unspecified: Secondary | ICD-10-CM | POA: Diagnosis not present

## 2012-03-04 DIAGNOSIS — F411 Generalized anxiety disorder: Secondary | ICD-10-CM | POA: Diagnosis not present

## 2012-03-04 DIAGNOSIS — G4700 Insomnia, unspecified: Secondary | ICD-10-CM | POA: Diagnosis not present

## 2012-03-04 DIAGNOSIS — I10 Essential (primary) hypertension: Secondary | ICD-10-CM | POA: Diagnosis not present

## 2012-03-04 DIAGNOSIS — F429 Obsessive-compulsive disorder, unspecified: Secondary | ICD-10-CM | POA: Diagnosis not present

## 2012-03-04 DIAGNOSIS — R110 Nausea: Secondary | ICD-10-CM | POA: Diagnosis not present

## 2012-03-04 DIAGNOSIS — E348 Other specified endocrine disorders: Secondary | ICD-10-CM | POA: Diagnosis not present

## 2012-03-04 DIAGNOSIS — K219 Gastro-esophageal reflux disease without esophagitis: Secondary | ICD-10-CM | POA: Diagnosis not present

## 2012-03-04 DIAGNOSIS — A088 Other specified intestinal infections: Secondary | ICD-10-CM | POA: Diagnosis not present

## 2012-03-04 DIAGNOSIS — IMO0002 Reserved for concepts with insufficient information to code with codable children: Secondary | ICD-10-CM | POA: Diagnosis not present

## 2012-03-06 ENCOUNTER — Ambulatory Visit (INDEPENDENT_AMBULATORY_CARE_PROVIDER_SITE_OTHER): Payer: BC Managed Care – PPO | Admitting: Psychiatry

## 2012-03-06 ENCOUNTER — Encounter (HOSPITAL_COMMUNITY): Payer: Self-pay | Admitting: Psychiatry

## 2012-03-06 VITALS — Wt 218.0 lb

## 2012-03-06 DIAGNOSIS — F322 Major depressive disorder, single episode, severe without psychotic features: Secondary | ICD-10-CM

## 2012-03-06 DIAGNOSIS — F329 Major depressive disorder, single episode, unspecified: Secondary | ICD-10-CM

## 2012-03-06 MED ORDER — TEMAZEPAM 7.5 MG PO CAPS: 15.0000 mg | ORAL_CAPSULE | Freq: Every evening | ORAL | Status: DC | PRN

## 2012-03-06 MED ORDER — AMITRIPTYLINE HCL 75 MG PO TABS: 75.0000 mg | ORAL_TABLET | Freq: Every day | ORAL | Status: DC
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
## 2012-03-11 ENCOUNTER — Ambulatory Visit (INDEPENDENT_AMBULATORY_CARE_PROVIDER_SITE_OTHER): Payer: BC Managed Care – PPO | Admitting: Psychiatry

## 2012-03-11 DIAGNOSIS — F329 Major depressive disorder, single episode, unspecified: Secondary | ICD-10-CM

## 2012-03-11 NOTE — Patient Instructions (Signed)
Discussed orally 

## 2012-03-11 NOTE — Progress Notes (Signed)
Patient:  [name]   DOB: 1980-11-24  MR Number: [id number]  Location: [facility]:  [address] [place],  [place], [address]  Start: Tuesday 03/11/2012 9:05 AM End: Tuesday 03/11/2012 9:50 AM  Provider/Observer:     Florencia Reasons, MSW, LCSW   Chief Complaint:      Chief Complaint  Patient presents with  . Depression    Reason For Service:   The patient  resumed services with this clinician as patient was experiencing increased symptoms of anxiety and depression related to chronic health issues. Patient has been seen in this practice since 2009 and continues to see psychiatrist Dr. Lolly Mustache for medication management. She participated in outpatient psychotherapy with this clinician from 04/08/2008 through 08/04/2008. The patient has multiple health issues including dumping syndrome which causes patient to experience chronic pain, constant nausea, and other gastrointestinal issues. The patient was hospitalized at the [facility] from 01/02/2012- 01/04/2012 due to to increased symptoms of depression and feelings of helplessness along with anxiety and panic attacks.  The patient is seen today for followup appointment.   Interventions Strategy:  Supportive therapy, cognitive behavioral therapy  Participation Level:   Active  Participation Quality:  Appropriate      Behavioral Observation:  Casual, Alert, and talkative  Current Psychosocial Factors: The patient reports improved medical condition, improved communication in marriage, and reconciling with her bother's fiancee.    Content of Session:   Reviewing symptoms, processing feelings, identifying thought patterns and effects on mood and behavior.  Current Status:   The patient reports improved mood and decreased anxiety.  Patient Progress:   Good. Patient was hospitalized at the [facility] in [place] 2 weeks ago due to to depression and suicidal thoughts. Patient reports disappointment  with the course of her hospitalization but is feeling better with her most recent medication adjustments. Patient also reports having surgery at [facility] on 03/04/2012. Patient is experiencing less nausea and is less hopeless about her medical condition. She also reports improved communication in the relationship with her husband as they have discussed concerns about their relationship. Patient reports feeling very supportive and appreciated by husband. She admits negative thinking patterns and assumptions had clouded her perceptions of the relationship. Patient reports increased involvement in activity and shares that she and her husband went on an overnight trip to the mountains this past weekend. She also reports reconciling with her brother's fiance after they had an open conversation about the negativity in their relationship for the past 2-1/2 months. Patient's statements in session reflect increased motivation and increased optimism.   Target Goals:   1. Decrease anxiety and excessive worry. 2.Improve mood. 3.  Reduce obsessive-compulsive behavior as evidenced by eliminating stockpiling hygiene products. 4. Process grief and loss issues.  Last Reviewed:   12/11/2011  Goals Addressed Today:    Decrease anxiety and excessive worry, improve mood   Impression/Diagnosis:   Patient presents with a history of symptoms of depression and anxiety since age 31. She has had 2 psychiatric hospitalizations due to to depression and anxiety. Her symptoms have worsened in the past 6 months as she has experienced increased chronic health issues. Her current symptoms include depressed mood, anxiety, sleep difficulty, excessive worrying, panic attacks, feelings of worthlessness, and poor self image. Diagnoses: Major depressive disorder, recurrent, moderate, rule out OCD     Diagnosis:  Axis I:  1. Major depressive disorder  Axis II: Deferred

## 2012-03-17 DIAGNOSIS — M2240 Chondromalacia patellae, unspecified knee: Secondary | ICD-10-CM | POA: Diagnosis not present

## 2012-03-17 DIAGNOSIS — M25569 Pain in unspecified knee: Secondary | ICD-10-CM | POA: Diagnosis not present

## 2012-03-17 DIAGNOSIS — IMO0002 Reserved for concepts with insufficient information to code with codable children: Secondary | ICD-10-CM | POA: Diagnosis not present

## 2012-03-18 ENCOUNTER — Ambulatory Visit (INDEPENDENT_AMBULATORY_CARE_PROVIDER_SITE_OTHER): Payer: BC Managed Care – PPO | Admitting: Psychiatry

## 2012-03-18 DIAGNOSIS — F329 Major depressive disorder, single episode, unspecified: Secondary | ICD-10-CM

## 2012-03-18 NOTE — Progress Notes (Signed)
Patient:  [name]   DOB: Jan 12, 1981  MR Number: [id number]  Location: [facility]:  [address] [place],  [place], [address]  Start: Tuesday 03/18/2012 11:05 AM End: Tuesday 03/18/2012 11:55 AM  Provider/Observer:     Florencia Reasons, MSW, LCSW   Chief Complaint:      Chief Complaint  Patient presents with  . Depression    Reason For Service:   The patient  resumed services with this clinician as patient was experiencing increased symptoms of anxiety and depression related to chronic health issues. Patient has been seen in this practice since 2009 and continues to see psychiatrist Dr. Lolly Mustache for medication management. She participated in outpatient psychotherapy with this clinician from 04/08/2008 through 08/04/2008. The patient has multiple health issues including dumping syndrome which causes patient to experience chronic pain, constant nausea, and other gastrointestinal issues. The patient was hospitalized at the [facility] from 01/02/2012- 01/04/2012 due to to increased symptoms of depression and feelings of helplessness along with anxiety and panic attacks.  The patient is seen today for followup appointment.   Interventions Strategy:  Supportive therapy, cognitive behavioral therapy  Participation Level:   Active  Participation Quality:  Appropriate      Behavioral Observation:  Casual, Alert, and talkative  Current Psychosocial Factors: The patient reports improved medical condition, improved communication in marriage.  The patient reports tension in her relationship with her brother's fiance due to to her recent negative comments about patient's mother and sister.  Content of Session:   Reviewing symptoms, processing feelings, identifying and challenging thinking errors  Current Status:   The patient reports improved mood and but increased anxiety regarding the relationship with her brothers fiance.  Patient Progress:   Good. Patient  reports continued improved mood and increased involvement in activity. She reports recently going out to dinner and a movie with her husband, her brother, and his fiance. However she reports feeling uncomfortable and being offended by brother's fiance's negative comments about patient's parents and her sister. Therapist works with patient to process her feelings and explore patient's options. Therapist and patient also discuss boundary issues in patient's relationship with her brother's fiance and identify areas that are within and those that are not within patient's control. Patient has become more observant of her thought patterns since using handout provided at last session. Therapist works with patient to identify and challenge her most prevalent common thinking errors which include mental filter, magnification, all or nothing thinking,  and jumping to conclusions. Patient reports continued improvement in her relationship with her husband. Patient also reports now beginning to experience emotions more fully whereas she has been numb in the past been attributed this to the change in her medication.   Target Goals:   1. Decrease anxiety and excessive worry. 2.Improve mood. 3.  Reduce obsessive-compulsive behavior as evidenced by eliminating stockpiling hygiene products. 4. Process grief and loss issues.  Last Reviewed:   12/11/2011  Goals Addressed Today:    Decrease anxiety and excessive worry, improve mood   Impression/Diagnosis:   Patient presents with a history of symptoms of depression and anxiety since age 31. She has had 2 psychiatric hospitalizations due to to depression and anxiety. Her symptoms have worsened in the past 6 months as she has experienced increased chronic health issues. Her current symptoms include depressed mood, anxiety, sleep difficulty, excessive worrying, panic attacks, feelings of worthlessness, and poor self image. Diagnoses: Major depressive disorder, recurrent, moderate,  rule  out OCD     Diagnosis:  Axis I:  1. Major depressive disorder             Axis II: Deferred

## 2012-03-18 NOTE — Patient Instructions (Signed)
Discussed orally 

## 2012-03-20 ENCOUNTER — Other Ambulatory Visit (HOSPITAL_COMMUNITY): Payer: Self-pay | Admitting: Psychiatry

## 2012-03-20 ENCOUNTER — Other Ambulatory Visit (HOSPITAL_COMMUNITY): Payer: Self-pay | Admitting: *Deleted

## 2012-03-20 DIAGNOSIS — R1013 Epigastric pain: Secondary | ICD-10-CM | POA: Diagnosis not present

## 2012-03-20 DIAGNOSIS — F329 Major depressive disorder, single episode, unspecified: Secondary | ICD-10-CM

## 2012-03-20 DIAGNOSIS — M2240 Chondromalacia patellae, unspecified knee: Secondary | ICD-10-CM | POA: Diagnosis not present

## 2012-03-20 DIAGNOSIS — K3189 Other diseases of stomach and duodenum: Secondary | ICD-10-CM | POA: Diagnosis not present

## 2012-03-20 DIAGNOSIS — R197 Diarrhea, unspecified: Secondary | ICD-10-CM | POA: Diagnosis not present

## 2012-03-20 DIAGNOSIS — M25469 Effusion, unspecified knee: Secondary | ICD-10-CM | POA: Diagnosis not present

## 2012-03-20 DIAGNOSIS — M25569 Pain in unspecified knee: Secondary | ICD-10-CM | POA: Diagnosis not present

## 2012-03-20 DIAGNOSIS — Z9889 Other specified postprocedural states: Secondary | ICD-10-CM | POA: Diagnosis not present

## 2012-03-21 ENCOUNTER — Other Ambulatory Visit (HOSPITAL_COMMUNITY): Payer: Self-pay | Admitting: Psychiatry

## 2012-03-21 ENCOUNTER — Other Ambulatory Visit (HOSPITAL_COMMUNITY): Payer: Self-pay | Admitting: *Deleted

## 2012-03-21 DIAGNOSIS — F329 Major depressive disorder, single episode, unspecified: Secondary | ICD-10-CM

## 2012-03-25 ENCOUNTER — Ambulatory Visit (INDEPENDENT_AMBULATORY_CARE_PROVIDER_SITE_OTHER): Payer: BC Managed Care – PPO | Admitting: Psychiatry

## 2012-03-25 DIAGNOSIS — F329 Major depressive disorder, single episode, unspecified: Secondary | ICD-10-CM

## 2012-03-25 NOTE — Patient Instructions (Signed)
Discussed orally 

## 2012-03-25 NOTE — Progress Notes (Signed)
Patient:  [name]   DOB: 06-01-1981  MR Number: [id number]  Location: [facility]:  [address] [place],  [place], [address]  Start: Tuesday 03/25/2012 2:00 PM End: Tuesday 03/25/2012 2:30 PM  Provider/Observer:     Florencia Reasons, MSW, LCSW   Chief Complaint:      Chief Complaint  Patient presents with  . Depression    Reason For Service:   The patient  resumed services with this clinician as patient was experiencing increased symptoms of anxiety and depression related to chronic health issues. Patient has been seen in this practice since 2009 and continues to see psychiatrist Dr. Lolly Mustache for medication management. She participated in outpatient psychotherapy with this clinician from 04/08/2008 through 08/04/2008. The patient has multiple health issues including dumping syndrome which causes patient to experience chronic pain, constant nausea, and other gastrointestinal issues. The patient was hospitalized at the [facility] from 01/02/2012- 01/04/2012 due to to increased symptoms of depression and feelings of helplessness along with anxiety and panic attacks.  The patient is seen today for followup appointment.   Interventions Strategy:  Supportive therapy, cognitive behavioral therapy  Participation Level:   Active  Participation Quality:  Appropriate      Behavioral Observation:  Casual, Alert, and talkative, pleasant  Current Psychosocial Factors: The patient reports her gastric bypass surgeon has recommended an injection that may improve patient's medical condition.  Content of Session:   Reviewing symptoms, processing feelings, identifying and challenging thinking errors  Current Status:   The patient reports improved mood, decreased anxiety, decreased worry, increased involvement in activity, increased interest in self-care, and improved sleep pattern.  Patient Progress:   Good. Patient reports continued improved mood and increased  involvement in activity. She reports babysitting her niece this past weekend and going to the mountains with her family this past Sunday. Patient also reports increased interest in personal care and is very well groomed today and wears accessories. Patient also reports increased optimism about her health as her gastric bypass surgeon has recommended an injection that may improve patient's health. Patient is hopeful that she will begin the injections within the next 2 weeks. Patient reports continued improved communication with her husband. Patient reports continuing to be more emotional than she has in the past and states struggling in this area now that she isn't numb. Therapist works with patient to discuss a recent incident between patient and her husband and to identify the triggering incident as well as patient's thought patterns. Therapist works with patient to identify and challenge the cognitive distortions. Therapist also works with patient to review relaxation techniques. Patient is using diaphragmatic breathing and a meditation using breath awareness   Target Goals:   1. Decrease anxiety and excessive worry. 2.Improve mood. 3.  Reduce obsessive-compulsive behavior as evidenced by eliminating stockpiling hygiene products. 4. Process grief and loss issues.  Last Reviewed:   12/11/2011  Goals Addressed Today:    Decrease anxiety and excessive worry, improve mood   Impression/Diagnosis:   Patient presents with a history of symptoms of depression and anxiety since age 86. She has had 2 psychiatric hospitalizations due to to depression and anxiety. Her symptoms have worsened in the past 6 months as she has experienced increased chronic health issues. Her current symptoms include depressed mood, anxiety, sleep difficulty, excessive worrying, panic attacks, feelings of worthlessness, and poor self image. Diagnoses: Major depressive disorder, recurrent, moderate, rule out OCD     Diagnosis:  Axis  I:    1. Major depressive disorder             Axis II: Deferred

## 2012-03-26 ENCOUNTER — Other Ambulatory Visit (HOSPITAL_COMMUNITY): Payer: Self-pay | Admitting: *Deleted

## 2012-03-26 DIAGNOSIS — F329 Major depressive disorder, single episode, unspecified: Secondary | ICD-10-CM

## 2012-03-27 ENCOUNTER — Encounter (HOSPITAL_COMMUNITY): Payer: Self-pay | Admitting: Psychiatry

## 2012-03-27 ENCOUNTER — Ambulatory Visit (INDEPENDENT_AMBULATORY_CARE_PROVIDER_SITE_OTHER): Payer: BC Managed Care – PPO | Admitting: Psychiatry

## 2012-03-27 VITALS — Wt 227.0 lb

## 2012-03-27 DIAGNOSIS — F322 Major depressive disorder, single episode, severe without psychotic features: Secondary | ICD-10-CM

## 2012-03-27 DIAGNOSIS — F329 Major depressive disorder, single episode, unspecified: Secondary | ICD-10-CM

## 2012-03-27 MED ORDER — AMITRIPTYLINE HCL 75 MG PO TABS: 75.0000 mg | ORAL_TABLET | Freq: Every day | ORAL | Status: DC

## 2012-03-27 MED ORDER — TEMAZEPAM 15 MG PO CAPS: 15.0000 mg | ORAL_CAPSULE | Freq: Every day | ORAL | Status: DC

## 2012-03-27 MED ORDER — HYDROXYZINE HCL 50 MG PO TABS: 50.0000 mg | ORAL_TABLET | Freq: Three times a day (TID) | ORAL | Status: DC
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
## 2012-04-02 DIAGNOSIS — R635 Abnormal weight gain: Secondary | ICD-10-CM | POA: Diagnosis not present

## 2012-04-02 DIAGNOSIS — Z0189 Encounter for other specified special examinations: Secondary | ICD-10-CM | POA: Diagnosis not present

## 2012-04-04 DIAGNOSIS — K3189 Other diseases of stomach and duodenum: Secondary | ICD-10-CM | POA: Diagnosis not present

## 2012-04-04 DIAGNOSIS — M25569 Pain in unspecified knee: Secondary | ICD-10-CM | POA: Diagnosis not present

## 2012-04-04 DIAGNOSIS — R109 Unspecified abdominal pain: Secondary | ICD-10-CM | POA: Diagnosis not present

## 2012-04-04 DIAGNOSIS — G8929 Other chronic pain: Secondary | ICD-10-CM | POA: Diagnosis not present

## 2012-04-04 DIAGNOSIS — R197 Diarrhea, unspecified: Secondary | ICD-10-CM | POA: Diagnosis not present

## 2012-04-08 DIAGNOSIS — G9389 Other specified disorders of brain: Secondary | ICD-10-CM | POA: Diagnosis not present

## 2012-04-08 DIAGNOSIS — IMO0002 Reserved for concepts with insufficient information to code with codable children: Secondary | ICD-10-CM | POA: Diagnosis not present

## 2012-04-08 DIAGNOSIS — R109 Unspecified abdominal pain: Secondary | ICD-10-CM | POA: Diagnosis not present

## 2012-04-08 DIAGNOSIS — E348 Other specified endocrine disorders: Secondary | ICD-10-CM | POA: Diagnosis not present

## 2012-04-08 DIAGNOSIS — R51 Headache: Secondary | ICD-10-CM | POA: Diagnosis not present

## 2012-04-08 DIAGNOSIS — G8929 Other chronic pain: Secondary | ICD-10-CM | POA: Diagnosis not present

## 2012-04-08 DIAGNOSIS — G894 Chronic pain syndrome: Secondary | ICD-10-CM | POA: Diagnosis not present

## 2012-04-09 ENCOUNTER — Ambulatory Visit (HOSPITAL_COMMUNITY): Payer: Self-pay | Admitting: Psychiatry

## 2012-04-11 DIAGNOSIS — M25469 Effusion, unspecified knee: Secondary | ICD-10-CM | POA: Diagnosis not present

## 2012-04-11 DIAGNOSIS — M7120 Synovial cyst of popliteal space [Baker], unspecified knee: Secondary | ICD-10-CM | POA: Diagnosis not present

## 2012-04-11 DIAGNOSIS — M25569 Pain in unspecified knee: Secondary | ICD-10-CM | POA: Diagnosis not present

## 2012-04-12 DIAGNOSIS — I82409 Acute embolism and thrombosis of unspecified deep veins of unspecified lower extremity: Secondary | ICD-10-CM | POA: Insufficient documentation
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
## 2012-04-15 ENCOUNTER — Other Ambulatory Visit: Payer: Self-pay | Admitting: Family Medicine

## 2012-04-15 DIAGNOSIS — M7989 Other specified soft tissue disorders: Secondary | ICD-10-CM

## 2012-04-16 ENCOUNTER — Ambulatory Visit (INDEPENDENT_AMBULATORY_CARE_PROVIDER_SITE_OTHER): Payer: BC Managed Care – PPO | Admitting: Psychiatry

## 2012-04-16 DIAGNOSIS — F329 Major depressive disorder, single episode, unspecified: Secondary | ICD-10-CM

## 2012-04-16 NOTE — Progress Notes (Signed)
Patient:  [name]   DOB: 1981/02/03  MR Number: [id number]  Location: [facility]:  [address] [place] [place],  [place], [address]  Start: Wednesday 04/16/2012 2:00 PM End: Wednesday 04/16/2012 2:45 PM  Provider/Observer:     Florencia Reasons, MSW, LCSW   Chief Complaint:      Chief Complaint  Patient presents with  . Depression  . Anxiety    Reason For Service:   The patient  resumed services with this clinician as patient was experiencing increased symptoms of anxiety and depression related to chronic health issues. Patient has been seen in this practice since 2009 and continues to see psychiatrist Dr. Lolly Mustache for medication management. She participated in outpatient psychotherapy with this clinician from 04/08/2008 through 08/04/2008. The patient has multiple health issues including dumping syndrome which causes patient to experience chronic pain, constant nausea, and other gastrointestinal issues. The patient was hospitalized at the [facility] from 01/02/2012- 01/04/2012 due to to increased symptoms of depression and feelings of helplessness along with anxiety and panic attacks.  The patient is seen today for followup appointment.   Interventions Strategy:  Supportive therapy, cognitive behavioral therapy  Participation Level:   Active  Participation Quality:  Appropriate      Behavioral Observation:  Casual, Alert, and talkative, pleasant  Current Psychosocial Factors: The patient reports her pet dog of 21 years had to be taken to the vet last night and is dying  Content of Session:   Reviewing symptoms, processing feelings, reinforcing patient's efforts to use her assertiveness skills and to set and maintain boundaries, discussing patient's efforts in identifying and challenging cognitive distortions  Current Status:   The patient reports decreased depressed mood ( 5 on 1-10 scale), decreased anxiety ( 3 on 1-10 scale), decreased worry, increased  involvement in activity, increased interest in self-care, and improved sleep pattern.   Patient Progress:   Good. Patient expresses appropriate sadness today as her pet dog of 21 years had to be taken to the vet last night and is dying. Patient is tearful at times but says that it is probably for the best as her dog had become very old and ill. She states not wanting her dog to suffer any longer. Patient reports increased acceptance of appropriate expression of her emotions now that she no longer feels numb as she has in the past. Patient is still optimistic about the possibility of having an injection that may improve patient's health. She is scheduled to have blood work tomorrow and hopes to have the injections in the near future. She also reports having a blood clot in her left leg and going to the emergency room this past Friday. She is scheduled to have a Doppler ultrasound tomorrow as she has had 3 blood clots in the past 2-3 years. Per patient's report, doctors are concerned that she may have some type of blood disorder. Patient expresses appropriate concern but not excessive worry. She reports attending several medical appointments in [place] and states feeling empowered that she has been able to go to the appointments alone. Patient has maintained involvement in activities including attending her brother's wedding and babysitting her niece. Patient reports continued improvement in use of assertiveness skills and setting and maintaining boundaries. She cites a recent example regarding talking with her sister-in-law about clear expectations and boundaries when sister-in-law asked her about babysitting. Patient is excited about babysitting the baby who is due in June.      Target Goals:  1. Decrease anxiety and excessive worry. 2.Improve mood. 3.  Reduce obsessive-compulsive behavior as evidenced by eliminating stockpiling hygiene products. 4. Process grief and loss issues.  Last  Reviewed:   12/11/2011  Goals Addressed Today:    Goals 1, 2, and 3  Impression/Diagnosis:   Patient presents with a history of symptoms of depression and anxiety since age 66. She has had 2 psychiatric hospitalizations due to to depression and anxiety. Her symptoms have worsened in the past 6 months as she has experienced increased chronic health issues. Her current symptoms include depressed mood, anxiety, sleep difficulty, excessive worrying, panic attacks, feelings of worthlessness, and poor self image. Diagnoses: Major depressive disorder, recurrent, moderate, rule out OCD     Diagnosis:  Axis I:  Major Depressive Disorder          Axis II: Deferred

## 2012-04-16 NOTE — Patient Instructions (Signed)
Discussed orally 

## 2012-04-17 ENCOUNTER — Ambulatory Visit (HOSPITAL_COMMUNITY)
Admission: RE | Admit: 2012-04-17 | Discharge: 2012-04-17 | Disposition: A | Discharge status: Home / Self Care | Payer: BC Managed Care – PPO | Source: Ambulatory Visit | Attending: Family Medicine | Admitting: Family Medicine

## 2012-04-17 DIAGNOSIS — M7989 Other specified soft tissue disorders: Secondary | ICD-10-CM | POA: Insufficient documentation

## 2012-04-21 DIAGNOSIS — E348 Other specified endocrine disorders: Secondary | ICD-10-CM | POA: Diagnosis not present

## 2012-04-24 DIAGNOSIS — I82409 Acute embolism and thrombosis of unspecified deep veins of unspecified lower extremity: Secondary | ICD-10-CM | POA: Diagnosis not present

## 2012-04-24 DIAGNOSIS — K3189 Other diseases of stomach and duodenum: Secondary | ICD-10-CM | POA: Diagnosis not present

## 2012-04-24 DIAGNOSIS — R1013 Epigastric pain: Secondary | ICD-10-CM | POA: Diagnosis not present

## 2012-04-24 DIAGNOSIS — R197 Diarrhea, unspecified: Secondary | ICD-10-CM | POA: Diagnosis not present

## 2012-05-07 DIAGNOSIS — G894 Chronic pain syndrome: Secondary | ICD-10-CM | POA: Diagnosis not present

## 2012-05-07 DIAGNOSIS — Z3202 Encounter for pregnancy test, result negative: Secondary | ICD-10-CM | POA: Diagnosis not present

## 2012-05-07 DIAGNOSIS — Z30430 Encounter for insertion of intrauterine contraceptive device: Secondary | ICD-10-CM | POA: Diagnosis not present

## 2012-05-07 DIAGNOSIS — IMO0001 Reserved for inherently not codable concepts without codable children: Secondary | ICD-10-CM | POA: Diagnosis not present

## 2012-05-07 DIAGNOSIS — R109 Unspecified abdominal pain: Secondary | ICD-10-CM | POA: Diagnosis not present

## 2012-05-07 DIAGNOSIS — G8929 Other chronic pain: Secondary | ICD-10-CM | POA: Diagnosis not present

## 2012-05-07 DIAGNOSIS — IMO0002 Reserved for concepts with insufficient information to code with codable children: Secondary | ICD-10-CM | POA: Diagnosis not present

## 2012-05-09 ENCOUNTER — Ambulatory Visit (HOSPITAL_COMMUNITY): Payer: Self-pay | Admitting: Psychiatry

## 2012-05-09 ENCOUNTER — Ambulatory Visit (INDEPENDENT_AMBULATORY_CARE_PROVIDER_SITE_OTHER): Payer: BC Managed Care – PPO | Admitting: Psychiatry

## 2012-05-09 ENCOUNTER — Encounter (HOSPITAL_COMMUNITY): Payer: Self-pay | Admitting: Psychiatry

## 2012-05-09 VITALS — Wt 227.6 lb

## 2012-05-09 DIAGNOSIS — F323 Major depressive disorder, single episode, severe with psychotic features: Secondary | ICD-10-CM

## 2012-05-09 DIAGNOSIS — F322 Major depressive disorder, single episode, severe without psychotic features: Secondary | ICD-10-CM

## 2012-05-09 DIAGNOSIS — F329 Major depressive disorder, single episode, unspecified: Secondary | ICD-10-CM

## 2012-05-09 DIAGNOSIS — F429 Obsessive-compulsive disorder, unspecified: Secondary | ICD-10-CM

## 2012-05-09 DIAGNOSIS — F32A Depression, unspecified: Secondary | ICD-10-CM

## 2012-05-09 MED ORDER — HYDROXYZINE HCL 50 MG PO TABS: 50.0000 mg | ORAL_TABLET | Freq: Three times a day (TID) | ORAL | Status: DC

## 2012-05-09 MED ORDER — TEMAZEPAM 15 MG PO CAPS: 15.0000 mg | ORAL_CAPSULE | Freq: Every day | ORAL | Status: DC

## 2012-05-09 MED ORDER — FLUOXETINE HCL 10 MG PO CAPS: 50.0000 mg | ORAL_CAPSULE | Freq: Every day | ORAL | Status: DC

## 2012-05-09 MED ORDER — OLANZAPINE 10 MG PO TABS: 10.0000 mg | ORAL_TABLET | Freq: Every day | ORAL | Status: DC

## 2012-05-09 MED ORDER — AMITRIPTYLINE HCL 75 MG PO TABS: 75.0000 mg | ORAL_TABLET | Freq: Every day | ORAL | Status: DC
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
## 2012-05-09 NOTE — Patient Instructions (Addendum)
To prevent dementia Play cards or broad games Learn a musical instrument Dancing Use the other hand to brush teeth or open doors Get regular exercise and read or study some new subject  Keep fond memories of Grandmother in the forefront.  Maybe with pictures.

## 2012-05-14 ENCOUNTER — Other Ambulatory Visit (HOSPITAL_COMMUNITY): Payer: Self-pay | Admitting: Psychiatry

## 2012-05-14 DIAGNOSIS — R109 Unspecified abdominal pain: Secondary | ICD-10-CM | POA: Diagnosis not present

## 2012-05-14 DIAGNOSIS — Z9884 Bariatric surgery status: Secondary | ICD-10-CM | POA: Diagnosis not present

## 2012-05-14 DIAGNOSIS — G4733 Obstructive sleep apnea (adult) (pediatric): Secondary | ICD-10-CM | POA: Diagnosis not present

## 2012-05-15 ENCOUNTER — Ambulatory Visit (HOSPITAL_COMMUNITY): Payer: Self-pay | Admitting: Psychiatry

## 2012-05-15 NOTE — Telephone Encounter (Signed)
Refill approved.

## 2012-05-16 ENCOUNTER — Ambulatory Visit (HOSPITAL_COMMUNITY): Payer: Self-pay | Admitting: Psychiatry

## 2012-06-10 DIAGNOSIS — G8929 Other chronic pain: Secondary | ICD-10-CM | POA: Diagnosis not present

## 2012-06-10 DIAGNOSIS — R109 Unspecified abdominal pain: Secondary | ICD-10-CM | POA: Diagnosis not present

## 2012-06-10 DIAGNOSIS — R300 Dysuria: Secondary | ICD-10-CM | POA: Diagnosis not present

## 2012-06-10 DIAGNOSIS — Z309 Encounter for contraceptive management, unspecified: Secondary | ICD-10-CM | POA: Diagnosis not present

## 2012-06-10 DIAGNOSIS — G894 Chronic pain syndrome: Secondary | ICD-10-CM | POA: Diagnosis not present

## 2012-06-10 DIAGNOSIS — Z30431 Encounter for routine checking of intrauterine contraceptive device: Secondary | ICD-10-CM | POA: Diagnosis not present

## 2012-06-11 DIAGNOSIS — I10 Essential (primary) hypertension: Secondary | ICD-10-CM | POA: Diagnosis not present

## 2012-06-11 DIAGNOSIS — R791 Abnormal coagulation profile: Secondary | ICD-10-CM | POA: Diagnosis not present

## 2012-06-11 DIAGNOSIS — F429 Obsessive-compulsive disorder, unspecified: Secondary | ICD-10-CM | POA: Diagnosis not present

## 2012-06-11 DIAGNOSIS — D649 Anemia, unspecified: Secondary | ICD-10-CM | POA: Diagnosis not present

## 2012-06-11 DIAGNOSIS — F411 Generalized anxiety disorder: Secondary | ICD-10-CM | POA: Diagnosis not present

## 2012-06-11 DIAGNOSIS — R110 Nausea: Secondary | ICD-10-CM | POA: Diagnosis not present

## 2012-06-11 DIAGNOSIS — Z9884 Bariatric surgery status: Secondary | ICD-10-CM | POA: Diagnosis not present

## 2012-06-11 DIAGNOSIS — R197 Diarrhea, unspecified: Secondary | ICD-10-CM | POA: Diagnosis not present

## 2012-06-11 DIAGNOSIS — J309 Allergic rhinitis, unspecified: Secondary | ICD-10-CM | POA: Diagnosis not present

## 2012-06-11 DIAGNOSIS — K219 Gastro-esophageal reflux disease without esophagitis: Secondary | ICD-10-CM | POA: Diagnosis not present

## 2012-06-11 DIAGNOSIS — F329 Major depressive disorder, single episode, unspecified: Secondary | ICD-10-CM | POA: Diagnosis not present

## 2012-06-11 DIAGNOSIS — E876 Hypokalemia: Secondary | ICD-10-CM | POA: Diagnosis not present

## 2012-06-12 DIAGNOSIS — Z9884 Bariatric surgery status: Secondary | ICD-10-CM | POA: Diagnosis not present

## 2012-06-12 DIAGNOSIS — D649 Anemia, unspecified: Secondary | ICD-10-CM | POA: Diagnosis not present

## 2012-06-12 DIAGNOSIS — R110 Nausea: Secondary | ICD-10-CM | POA: Diagnosis not present

## 2012-06-12 DIAGNOSIS — K219 Gastro-esophageal reflux disease without esophagitis: Secondary | ICD-10-CM | POA: Diagnosis not present

## 2012-06-12 DIAGNOSIS — F329 Major depressive disorder, single episode, unspecified: Secondary | ICD-10-CM | POA: Diagnosis not present

## 2012-06-12 DIAGNOSIS — R197 Diarrhea, unspecified: Secondary | ICD-10-CM | POA: Diagnosis not present

## 2012-06-16 DIAGNOSIS — L259 Unspecified contact dermatitis, unspecified cause: Secondary | ICD-10-CM | POA: Diagnosis not present

## 2012-06-16 DIAGNOSIS — L570 Actinic keratosis: Secondary | ICD-10-CM | POA: Diagnosis not present

## 2012-06-16 DIAGNOSIS — L719 Rosacea, unspecified: Secondary | ICD-10-CM | POA: Diagnosis not present

## 2012-07-08 ENCOUNTER — Telehealth (HOSPITAL_COMMUNITY): Payer: Self-pay | Admitting: Psychiatry

## 2012-07-08 DIAGNOSIS — F329 Major depressive disorder, single episode, unspecified: Secondary | ICD-10-CM

## 2012-07-09 MED ORDER — HYDROXYZINE HCL 50 MG PO TABS: 50.0000 mg | ORAL_TABLET | Freq: Three times a day (TID) | ORAL | Status: DC

## 2012-07-09 NOTE — Telephone Encounter (Signed)
Refill request approved via eScripts.  

## 2012-07-10 ENCOUNTER — Ambulatory Visit (HOSPITAL_COMMUNITY): Payer: Self-pay | Admitting: Psychiatry

## 2012-07-10 DIAGNOSIS — R110 Nausea: Secondary | ICD-10-CM | POA: Diagnosis not present

## 2012-07-10 DIAGNOSIS — R197 Diarrhea, unspecified: Secondary | ICD-10-CM | POA: Diagnosis not present

## 2012-07-10 DIAGNOSIS — R1013 Epigastric pain: Secondary | ICD-10-CM | POA: Diagnosis not present

## 2012-07-10 DIAGNOSIS — Z9884 Bariatric surgery status: Secondary | ICD-10-CM | POA: Diagnosis not present

## 2012-07-10 DIAGNOSIS — G4733 Obstructive sleep apnea (adult) (pediatric): Secondary | ICD-10-CM | POA: Diagnosis not present

## 2012-07-10 DIAGNOSIS — K3189 Other diseases of stomach and duodenum: Secondary | ICD-10-CM | POA: Diagnosis not present

## 2012-07-11 ENCOUNTER — Encounter (HOSPITAL_COMMUNITY): Payer: Self-pay | Admitting: Psychiatry

## 2012-07-11 ENCOUNTER — Telehealth (HOSPITAL_COMMUNITY): Payer: Self-pay | Admitting: Psychiatry

## 2012-07-11 ENCOUNTER — Ambulatory Visit (INDEPENDENT_AMBULATORY_CARE_PROVIDER_SITE_OTHER): Payer: BC Managed Care – PPO | Admitting: Psychiatry

## 2012-07-11 VITALS — Wt 232.0 lb

## 2012-07-11 DIAGNOSIS — F5105 Insomnia due to other mental disorder: Secondary | ICD-10-CM | POA: Insufficient documentation

## 2012-07-11 DIAGNOSIS — F429 Obsessive-compulsive disorder, unspecified: Secondary | ICD-10-CM

## 2012-07-11 DIAGNOSIS — E559 Vitamin D deficiency, unspecified: Secondary | ICD-10-CM | POA: Insufficient documentation

## 2012-07-11 DIAGNOSIS — F32A Depression, unspecified: Secondary | ICD-10-CM

## 2012-07-11 DIAGNOSIS — F322 Major depressive disorder, single episode, severe without psychotic features: Secondary | ICD-10-CM | POA: Diagnosis not present

## 2012-07-11 DIAGNOSIS — F329 Major depressive disorder, single episode, unspecified: Secondary | ICD-10-CM

## 2012-07-11 DIAGNOSIS — F418 Other specified anxiety disorders: Secondary | ICD-10-CM | POA: Insufficient documentation

## 2012-07-11 MED ORDER — AMITRIPTYLINE HCL 75 MG PO TABS: 75.0000 mg | ORAL_TABLET | Freq: Every day | ORAL | Status: DC

## 2012-07-11 MED ORDER — TEMAZEPAM 15 MG PO CAPS: 15.0000 mg | ORAL_CAPSULE | Freq: Every evening | ORAL | Status: DC | PRN

## 2012-07-11 MED ORDER — GABAPENTIN 300 MG PO CAPS: ORAL_CAPSULE | ORAL | Status: DC
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
## 2012-07-11 NOTE — Telephone Encounter (Signed)
Phone message completed in the phone message section.  

## 2012-07-11 NOTE — Patient Instructions (Signed)
Keep it up.  Yoga is a very helpful exercise method.  On TV or on line [employer] is a source of high quality information about yoga and videos on yoga.  [name] is the world's number one video yoga instructor according to some experts.  There are exceptional health benefits that can be achieved through yoga.  The main principles of yoga is acceptance, no competition, no comparison, and no judgement.  It is exceptional in helping people meditate and get to a very relaxed state.   Relaxation is the ultimate solution for you.  You can seek it through tub baths, bubble baths, essential oils or incense, walking or chatting with friends, listening to soft music, watching a candle burn and just letting all thoughts go and appreciating the true essence of the Creator.   Call if problems or concerns.

## 2012-07-19 ENCOUNTER — Other Ambulatory Visit: Payer: Self-pay

## 2012-07-22 DIAGNOSIS — R7301 Impaired fasting glucose: Secondary | ICD-10-CM | POA: Diagnosis not present

## 2012-07-28 DIAGNOSIS — IMO0002 Reserved for concepts with insufficient information to code with codable children: Secondary | ICD-10-CM | POA: Diagnosis not present

## 2012-07-28 DIAGNOSIS — IMO0001 Reserved for inherently not codable concepts without codable children: Secondary | ICD-10-CM | POA: Diagnosis not present

## 2012-07-28 DIAGNOSIS — I82A29 Chronic embolism and thrombosis of unspecified axillary vein: Secondary | ICD-10-CM | POA: Diagnosis not present

## 2012-07-28 DIAGNOSIS — G8929 Other chronic pain: Secondary | ICD-10-CM | POA: Diagnosis not present

## 2012-07-28 DIAGNOSIS — G894 Chronic pain syndrome: Secondary | ICD-10-CM | POA: Diagnosis not present

## 2012-07-28 DIAGNOSIS — R109 Unspecified abdominal pain: Secondary | ICD-10-CM | POA: Diagnosis not present

## 2012-07-30 DIAGNOSIS — F429 Obsessive-compulsive disorder, unspecified: Secondary | ICD-10-CM | POA: Diagnosis present

## 2012-07-30 DIAGNOSIS — I82409 Acute embolism and thrombosis of unspecified deep veins of unspecified lower extremity: Secondary | ICD-10-CM | POA: Diagnosis not present

## 2012-07-30 DIAGNOSIS — K219 Gastro-esophageal reflux disease without esophagitis: Secondary | ICD-10-CM | POA: Diagnosis present

## 2012-07-30 DIAGNOSIS — E669 Obesity, unspecified: Secondary | ICD-10-CM | POA: Diagnosis present

## 2012-07-30 DIAGNOSIS — Z9884 Bariatric surgery status: Secondary | ICD-10-CM | POA: Diagnosis not present

## 2012-07-30 DIAGNOSIS — R112 Nausea with vomiting, unspecified: Secondary | ICD-10-CM | POA: Diagnosis not present

## 2012-07-30 DIAGNOSIS — F329 Major depressive disorder, single episode, unspecified: Secondary | ICD-10-CM | POA: Diagnosis present

## 2012-07-30 DIAGNOSIS — Z452 Encounter for adjustment and management of vascular access device: Secondary | ICD-10-CM | POA: Diagnosis not present

## 2012-07-30 DIAGNOSIS — F411 Generalized anxiety disorder: Secondary | ICD-10-CM | POA: Diagnosis present

## 2012-07-30 DIAGNOSIS — D649 Anemia, unspecified: Secondary | ICD-10-CM | POA: Diagnosis present

## 2012-07-30 DIAGNOSIS — E860 Dehydration: Secondary | ICD-10-CM | POA: Diagnosis present

## 2012-07-30 DIAGNOSIS — K3189 Other diseases of stomach and duodenum: Secondary | ICD-10-CM | POA: Diagnosis not present

## 2012-07-30 DIAGNOSIS — I129 Hypertensive chronic kidney disease with stage 1 through stage 4 chronic kidney disease, or unspecified chronic kidney disease: Secondary | ICD-10-CM | POA: Diagnosis present

## 2012-07-30 DIAGNOSIS — N189 Chronic kidney disease, unspecified: Secondary | ICD-10-CM | POA: Diagnosis present

## 2012-07-30 DIAGNOSIS — N390 Urinary tract infection, site not specified: Secondary | ICD-10-CM | POA: Diagnosis not present

## 2012-07-30 DIAGNOSIS — R1013 Epigastric pain: Secondary | ICD-10-CM | POA: Diagnosis not present

## 2012-07-30 DIAGNOSIS — Z79899 Other long term (current) drug therapy: Secondary | ICD-10-CM | POA: Diagnosis not present

## 2012-07-30 DIAGNOSIS — G4730 Sleep apnea, unspecified: Secondary | ICD-10-CM | POA: Diagnosis present

## 2012-07-31 DIAGNOSIS — Z95828 Presence of other vascular implants and grafts: Secondary | ICD-10-CM

## 2012-07-31 HISTORY — PX: PORTACATH PLACEMENT: SHX2246

## 2012-07-31 HISTORY — DX: Presence of other vascular implants and grafts: Z95.828

## 2012-08-01 DIAGNOSIS — Z452 Encounter for adjustment and management of vascular access device: Secondary | ICD-10-CM | POA: Diagnosis not present

## 2012-08-01 DIAGNOSIS — R112 Nausea with vomiting, unspecified: Secondary | ICD-10-CM | POA: Diagnosis not present

## 2012-08-01 DIAGNOSIS — K3189 Other diseases of stomach and duodenum: Secondary | ICD-10-CM | POA: Diagnosis not present

## 2012-08-01 DIAGNOSIS — Z79899 Other long term (current) drug therapy: Secondary | ICD-10-CM | POA: Diagnosis not present

## 2012-08-05 DIAGNOSIS — I82409 Acute embolism and thrombosis of unspecified deep veins of unspecified lower extremity: Secondary | ICD-10-CM | POA: Diagnosis not present

## 2012-08-14 DIAGNOSIS — F411 Generalized anxiety disorder: Secondary | ICD-10-CM | POA: Diagnosis not present

## 2012-08-14 DIAGNOSIS — G43909 Migraine, unspecified, not intractable, without status migrainosus: Secondary | ICD-10-CM | POA: Diagnosis not present

## 2012-08-14 DIAGNOSIS — E348 Other specified endocrine disorders: Secondary | ICD-10-CM | POA: Diagnosis not present

## 2012-08-14 DIAGNOSIS — R110 Nausea: Secondary | ICD-10-CM | POA: Diagnosis not present

## 2012-08-14 DIAGNOSIS — Z452 Encounter for adjustment and management of vascular access device: Secondary | ICD-10-CM | POA: Diagnosis not present

## 2012-08-14 DIAGNOSIS — Z9884 Bariatric surgery status: Secondary | ICD-10-CM | POA: Diagnosis not present

## 2012-08-14 DIAGNOSIS — G43009 Migraine without aura, not intractable, without status migrainosus: Secondary | ICD-10-CM | POA: Diagnosis not present

## 2012-08-18 ENCOUNTER — Ambulatory Visit (HOSPITAL_COMMUNITY)
Admission: RE | Admit: 2012-08-18 | Discharge: 2012-08-18 | Disposition: A | Discharge status: Home / Self Care | Payer: BC Managed Care – PPO | Source: Ambulatory Visit | Attending: Family Medicine | Admitting: Family Medicine

## 2012-08-18 ENCOUNTER — Other Ambulatory Visit: Payer: Self-pay | Admitting: Family Medicine

## 2012-08-18 DIAGNOSIS — W19XXXA Unspecified fall, initial encounter: Secondary | ICD-10-CM | POA: Insufficient documentation

## 2012-08-18 DIAGNOSIS — M7989 Other specified soft tissue disorders: Secondary | ICD-10-CM | POA: Diagnosis not present

## 2012-08-18 DIAGNOSIS — S59909A Unspecified injury of unspecified elbow, initial encounter: Secondary | ICD-10-CM | POA: Insufficient documentation

## 2012-08-18 DIAGNOSIS — M79609 Pain in unspecified limb: Secondary | ICD-10-CM | POA: Diagnosis not present

## 2012-08-18 DIAGNOSIS — M25539 Pain in unspecified wrist: Secondary | ICD-10-CM | POA: Diagnosis not present

## 2012-08-18 DIAGNOSIS — S6990XA Unspecified injury of unspecified wrist, hand and finger(s), initial encounter: Secondary | ICD-10-CM | POA: Diagnosis not present

## 2012-08-18 DIAGNOSIS — M25529 Pain in unspecified elbow: Secondary | ICD-10-CM | POA: Insufficient documentation

## 2012-08-18 DIAGNOSIS — S59919A Unspecified injury of unspecified forearm, initial encounter: Secondary | ICD-10-CM | POA: Insufficient documentation

## 2012-08-18 DIAGNOSIS — R52 Pain, unspecified: Secondary | ICD-10-CM

## 2012-08-19 DIAGNOSIS — K219 Gastro-esophageal reflux disease without esophagitis: Secondary | ICD-10-CM | POA: Insufficient documentation

## 2012-08-22 ENCOUNTER — Encounter: Payer: Self-pay | Admitting: *Deleted

## 2012-08-29 ENCOUNTER — Encounter: Payer: Self-pay | Admitting: Nurse Practitioner

## 2012-08-29 ENCOUNTER — Ambulatory Visit (INDEPENDENT_AMBULATORY_CARE_PROVIDER_SITE_OTHER): Payer: BC Managed Care – PPO | Admitting: Nurse Practitioner

## 2012-08-29 VITALS — BP 118/84 | Wt 238.2 lb

## 2012-08-29 DIAGNOSIS — F329 Major depressive disorder, single episode, unspecified: Secondary | ICD-10-CM

## 2012-08-29 DIAGNOSIS — K219 Gastro-esophageal reflux disease without esophagitis: Secondary | ICD-10-CM | POA: Diagnosis not present

## 2012-08-29 DIAGNOSIS — R1033 Periumbilical pain: Secondary | ICD-10-CM | POA: Diagnosis not present

## 2012-08-29 DIAGNOSIS — G5620 Lesion of ulnar nerve, unspecified upper limb: Secondary | ICD-10-CM

## 2012-08-29 DIAGNOSIS — G609 Hereditary and idiopathic neuropathy, unspecified: Secondary | ICD-10-CM | POA: Diagnosis not present

## 2012-08-29 DIAGNOSIS — G5622 Lesion of ulnar nerve, left upper limb: Secondary | ICD-10-CM

## 2012-08-29 MED ORDER — HYDROXYZINE HCL 50 MG PO TABS: 50.0000 mg | ORAL_TABLET | Freq: Three times a day (TID) | ORAL | Status: DC

## 2012-08-30 DIAGNOSIS — G5620 Lesion of ulnar nerve, unspecified upper limb: Secondary | ICD-10-CM | POA: Insufficient documentation

## 2012-08-30 NOTE — Assessment & Plan Note (Signed)
Schedule nerve conduction velocity test for the ulnar nerve left arm. Refer to Dr. Merlyn Lot for further evaluation. Call back sooner if symptoms worsen.

## 2012-08-30 NOTE — Progress Notes (Signed)
Subjective:  Presents for continued complaints of pain numbness and tingling going down the left lateral arm from the elbow into the small finger and side of the left ring finger. See previous note. Has not improved. States it feels like bee stings at times. Began on 3/14 when patient fell and hit the lateral part of her arm. X-rays of the left forearm and wrist were normal. No neck or shoulder pain. No relief with wrist brace.  Objective:   BP 118/84  Wt 238 lb 3.2 oz (108.047 kg)  BMI 40.87 kg/m2 NAD. Alert, oriented. Good ROM of the left elbow tenderness noted towards the lateral epicondyle. Tenderness is linear going down the lateral part of the arm towards the left wrist. Very tender to palpation. Good ROM of the left wrist with tenderness in the lateral wrist area. Diminished sensation in the left small finger and part of the left ring finger. Strong radial pulse. Fingers warm with good capillary refill.

## 2012-09-09 ENCOUNTER — Ambulatory Visit (INDEPENDENT_AMBULATORY_CARE_PROVIDER_SITE_OTHER): Payer: BC Managed Care – PPO | Admitting: Family Medicine

## 2012-09-09 ENCOUNTER — Encounter: Payer: Self-pay | Admitting: Family Medicine

## 2012-09-09 ENCOUNTER — Ambulatory Visit (INDEPENDENT_AMBULATORY_CARE_PROVIDER_SITE_OTHER): Payer: BC Managed Care – PPO | Admitting: Psychiatry

## 2012-09-09 ENCOUNTER — Encounter (HOSPITAL_COMMUNITY): Payer: Self-pay | Admitting: Psychiatry

## 2012-09-09 VITALS — BP 120/86 | HR 70 | Wt 240.0 lb

## 2012-09-09 VITALS — Wt 239.8 lb

## 2012-09-09 DIAGNOSIS — F429 Obsessive-compulsive disorder, unspecified: Secondary | ICD-10-CM

## 2012-09-09 DIAGNOSIS — G5622 Lesion of ulnar nerve, left upper limb: Secondary | ICD-10-CM

## 2012-09-09 DIAGNOSIS — F5105 Insomnia due to other mental disorder: Secondary | ICD-10-CM

## 2012-09-09 DIAGNOSIS — F322 Major depressive disorder, single episode, severe without psychotic features: Secondary | ICD-10-CM | POA: Diagnosis not present

## 2012-09-09 DIAGNOSIS — R1033 Periumbilical pain: Secondary | ICD-10-CM | POA: Diagnosis not present

## 2012-09-09 DIAGNOSIS — K219 Gastro-esophageal reflux disease without esophagitis: Secondary | ICD-10-CM | POA: Diagnosis not present

## 2012-09-09 DIAGNOSIS — G5620 Lesion of ulnar nerve, unspecified upper limb: Secondary | ICD-10-CM

## 2012-09-09 DIAGNOSIS — G894 Chronic pain syndrome: Secondary | ICD-10-CM | POA: Insufficient documentation

## 2012-09-09 DIAGNOSIS — F329 Major depressive disorder, single episode, unspecified: Secondary | ICD-10-CM | POA: Diagnosis not present

## 2012-09-09 DIAGNOSIS — F32A Depression, unspecified: Secondary | ICD-10-CM

## 2012-09-09 DIAGNOSIS — G609 Hereditary and idiopathic neuropathy, unspecified: Secondary | ICD-10-CM | POA: Diagnosis not present

## 2012-09-09 DIAGNOSIS — E559 Vitamin D deficiency, unspecified: Secondary | ICD-10-CM

## 2012-09-09 MED ORDER — AMITRIPTYLINE HCL 75 MG PO TABS: 75.0000 mg | ORAL_TABLET | Freq: Every day | ORAL | Status: DC

## 2012-09-09 MED ORDER — FLUOXETINE HCL 10 MG PO CAPS: 50.0000 mg | ORAL_CAPSULE | Freq: Every day | ORAL | Status: DC

## 2012-09-09 MED ORDER — HYDROXYZINE HCL 50 MG PO TABS: 50.0000 mg | ORAL_TABLET | Freq: Three times a day (TID) | ORAL | Status: DC

## 2012-09-09 MED ORDER — TEMAZEPAM 15 MG PO CAPS: 15.0000 mg | ORAL_CAPSULE | Freq: Every evening | ORAL | Status: DC | PRN

## 2012-09-09 MED ORDER — OLANZAPINE 15 MG PO TABS: 15.0000 mg | ORAL_TABLET | Freq: Every day | ORAL | Status: DC

## 2012-09-09 NOTE — Progress Notes (Signed)
Subjective:    Patient ID: [name], female    DOB: 08/29/1980, 32 y.o.   MRN: [id number]  HPI Dirrhea with fruits and veggies. yest had spell of abd pain. Assoc with nausea.due for nerve cond studies next wk due to neuropathy. If Hubbell studies are pos, may need surg. Patient states knot buried able to exercise at this time. Due to see GI specialist once again soon.  Review of Systems ROS otherwise negative.    Objective:   Physical Exam  Alert no acute distress. Vitals reviewed. Lungs clear. Heart rare rhythm. Unfortunately weight remains stable at 240.      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 #2 reflux ongoing challenge. #3 obesity discussed patient to work hard on diet. Plan exercise diet discussed. Followup monthly as mandated per insurance company and GI specialist. WSL

## 2012-09-09 NOTE — Patient Instructions (Signed)
Set a timer for 8 minutes and walk for that amount of time in the house or in the yard.  Mark "8" on a calendar for that day.  Do that every day this week.  Then next week increase the time to 9 minutes and then mark the calendar with a 9 for that day.  Each week increase your exercise by one minute.  Keep a record of this so you can see what progress you are making.  Do this every day, just like eating and sleeping.  It is good for pain control, depression, and for your soul/spirit.  Bring the record in for your next visit so we can talk about your effort and how you feel with the new exercise program going and working for you.  Yoga is a very helpful exercise method.  On TV, on line, or by DVD [name] is a source of high quality information about yoga and videos on yoga.  [name] is the world's number one video yoga instructor according to some experts.  There are exceptional health benefits that can be achieved through yoga.  The main principles of yoga is acceptance, no competition, no comparison, and no judgement.  It is exceptional in helping people meditate and get to a very relaxed state.   Keep the meditation up that is awesome.  Relaxation is the ultimate solution for you.  You can seek it through tub baths, bubble baths, essential oils or incense, walking or chatting with friends, listening to soft music, watching a candle burn and just letting all thoughts go and appreciating the true essence of the Creator.  Pets or animals may be very helpful.  You might spend some time with them and then go do more directed meditation.  Take care of yourself.  No one else is standing up to do the job and only you know what you need.   GET SERIOUS about taking care of yourself.  Do the next right thing and that often means doing something to care for yourself along the lines of are you hungry, are you angry, are you lonely, are you tired, are you scared?  [employer] is what that stands for.  Call if problems or  concerns.
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
## 2012-09-17 DIAGNOSIS — T82898A Other specified complication of vascular prosthetic devices, implants and grafts, initial encounter: Secondary | ICD-10-CM | POA: Diagnosis not present

## 2012-09-17 DIAGNOSIS — R110 Nausea: Secondary | ICD-10-CM | POA: Diagnosis not present

## 2012-09-17 DIAGNOSIS — R791 Abnormal coagulation profile: Secondary | ICD-10-CM | POA: Diagnosis not present

## 2012-09-17 DIAGNOSIS — G4733 Obstructive sleep apnea (adult) (pediatric): Secondary | ICD-10-CM | POA: Diagnosis not present

## 2012-09-17 DIAGNOSIS — R197 Diarrhea, unspecified: Secondary | ICD-10-CM | POA: Diagnosis not present

## 2012-10-08 DIAGNOSIS — K911 Postgastric surgery syndromes: Secondary | ICD-10-CM | POA: Diagnosis not present

## 2012-10-08 DIAGNOSIS — R197 Diarrhea, unspecified: Secondary | ICD-10-CM | POA: Diagnosis not present

## 2012-10-08 DIAGNOSIS — R948 Abnormal results of function studies of other organs and systems: Secondary | ICD-10-CM | POA: Diagnosis not present

## 2012-10-09 ENCOUNTER — Ambulatory Visit: Payer: BC Managed Care – PPO | Admitting: Family Medicine

## 2012-10-17 ENCOUNTER — Ambulatory Visit (INDEPENDENT_AMBULATORY_CARE_PROVIDER_SITE_OTHER): Payer: BC Managed Care – PPO | Admitting: Family Medicine

## 2012-10-17 ENCOUNTER — Encounter: Payer: Self-pay | Admitting: Family Medicine

## 2012-10-17 VITALS — BP 132/100 | HR 70 | Ht 64.0 in | Wt 240.5 lb

## 2012-10-17 DIAGNOSIS — G609 Hereditary and idiopathic neuropathy, unspecified: Secondary | ICD-10-CM

## 2012-10-17 DIAGNOSIS — G894 Chronic pain syndrome: Secondary | ICD-10-CM | POA: Diagnosis not present

## 2012-10-17 DIAGNOSIS — G5620 Lesion of ulnar nerve, unspecified upper limb: Secondary | ICD-10-CM | POA: Diagnosis not present

## 2012-10-17 DIAGNOSIS — S239XXA Sprain of unspecified parts of thorax, initial encounter: Secondary | ICD-10-CM | POA: Diagnosis not present

## 2012-10-17 DIAGNOSIS — F329 Major depressive disorder, single episode, unspecified: Secondary | ICD-10-CM | POA: Diagnosis not present

## 2012-10-17 DIAGNOSIS — R1033 Periumbilical pain: Secondary | ICD-10-CM | POA: Diagnosis not present

## 2012-10-17 DIAGNOSIS — K219 Gastro-esophageal reflux disease without esophagitis: Secondary | ICD-10-CM | POA: Diagnosis not present

## 2012-10-17 DIAGNOSIS — S139XXA Sprain of joints and ligaments of unspecified parts of neck, initial encounter: Secondary | ICD-10-CM | POA: Diagnosis not present

## 2012-10-17 NOTE — Patient Instructions (Signed)
Neuropraxia is the name of the injury to nerve causing numbness. Nearly always this resolves over wks to months.

## 2012-10-17 NOTE — Progress Notes (Signed)
Subjective:    Patient ID: [name], female    DOB: 14-Nov-1980, 32 y.o.   MRN: [id number]  HPI Has increased exercise lately. Also diet rec increasing protein. No sig weight gain or loss. Last weekend had difficult wk with vomiting Dr Merlyn Lot saw and did a nerve cndtn study. This was within normal limits. Was told they had nothing to offer her surgically.   Review of Systems Ongoing bouts of nausea and vomiting. An intermittent abdominal cramping. ROS otherwise negative    Objective:   Physical Exam   alert no acute distress. Weight noted. Significant obesity persists with minimal improvement. Lungs clear. Heart regular rate and rhythm. hand strength intact. Sensation slight diminished in all nerve distribution. Good strength. Left lateral knee diminished sensation. It is small patch.      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 Discussed. #2 mild ulnar neuropathy #3 morbid obesity plan no further workup at this time. Diet and exercise discussed at great length. Recheck in one month. WSL

## 2012-10-20 DIAGNOSIS — S239XXA Sprain of unspecified parts of thorax, initial encounter: Secondary | ICD-10-CM | POA: Diagnosis not present

## 2012-10-20 DIAGNOSIS — S139XXA Sprain of joints and ligaments of unspecified parts of neck, initial encounter: Secondary | ICD-10-CM | POA: Diagnosis not present

## 2012-10-21 ENCOUNTER — Telehealth (HOSPITAL_COMMUNITY): Payer: Self-pay | Admitting: Psychiatry

## 2012-10-21 DIAGNOSIS — F5105 Insomnia due to other mental disorder: Secondary | ICD-10-CM

## 2012-10-21 DIAGNOSIS — F418 Other specified anxiety disorders: Secondary | ICD-10-CM

## 2012-10-21 MED ORDER — TEMAZEPAM 15 MG PO CAPS: 15.0000 mg | ORAL_CAPSULE | Freq: Every evening | ORAL | Status: DC | PRN

## 2012-10-21 NOTE — Telephone Encounter (Signed)
Script written for pt to pick up.

## 2012-10-24 DIAGNOSIS — G894 Chronic pain syndrome: Secondary | ICD-10-CM | POA: Diagnosis not present

## 2012-10-24 DIAGNOSIS — G8929 Other chronic pain: Secondary | ICD-10-CM | POA: Diagnosis not present

## 2012-10-24 DIAGNOSIS — Z79899 Other long term (current) drug therapy: Secondary | ICD-10-CM | POA: Diagnosis not present

## 2012-10-24 DIAGNOSIS — Z5181 Encounter for therapeutic drug level monitoring: Secondary | ICD-10-CM | POA: Diagnosis not present

## 2012-10-24 DIAGNOSIS — I82409 Acute embolism and thrombosis of unspecified deep veins of unspecified lower extremity: Secondary | ICD-10-CM | POA: Diagnosis not present

## 2012-10-24 DIAGNOSIS — Z9889 Other specified postprocedural states: Secondary | ICD-10-CM | POA: Diagnosis not present

## 2012-10-24 DIAGNOSIS — R109 Unspecified abdominal pain: Secondary | ICD-10-CM | POA: Diagnosis not present

## 2012-10-29 ENCOUNTER — Ambulatory Visit (HOSPITAL_COMMUNITY): Payer: Self-pay | Admitting: Psychiatry

## 2012-11-04 ENCOUNTER — Ambulatory Visit (INDEPENDENT_AMBULATORY_CARE_PROVIDER_SITE_OTHER): Payer: BC Managed Care – PPO | Admitting: Psychiatry

## 2012-11-04 DIAGNOSIS — S239XXA Sprain of unspecified parts of thorax, initial encounter: Secondary | ICD-10-CM | POA: Diagnosis not present

## 2012-11-04 DIAGNOSIS — F329 Major depressive disorder, single episode, unspecified: Secondary | ICD-10-CM

## 2012-11-04 DIAGNOSIS — F429 Obsessive-compulsive disorder, unspecified: Secondary | ICD-10-CM

## 2012-11-04 DIAGNOSIS — S139XXA Sprain of joints and ligaments of unspecified parts of neck, initial encounter: Secondary | ICD-10-CM | POA: Diagnosis not present
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
## 2012-11-04 NOTE — Patient Instructions (Signed)
Discussed orally 

## 2012-11-05 ENCOUNTER — Ambulatory Visit (INDEPENDENT_AMBULATORY_CARE_PROVIDER_SITE_OTHER): Payer: BC Managed Care – PPO | Admitting: Psychiatry

## 2012-11-05 ENCOUNTER — Encounter (HOSPITAL_COMMUNITY): Payer: Self-pay | Admitting: Psychiatry

## 2012-11-05 VITALS — BP 118/78 | Ht 64.25 in | Wt 236.6 lb

## 2012-11-05 DIAGNOSIS — F32A Depression, unspecified: Secondary | ICD-10-CM

## 2012-11-05 DIAGNOSIS — F429 Obsessive-compulsive disorder, unspecified: Secondary | ICD-10-CM

## 2012-11-05 DIAGNOSIS — F329 Major depressive disorder, single episode, unspecified: Secondary | ICD-10-CM

## 2012-11-05 DIAGNOSIS — G894 Chronic pain syndrome: Secondary | ICD-10-CM

## 2012-11-05 DIAGNOSIS — F322 Major depressive disorder, single episode, severe without psychotic features: Secondary | ICD-10-CM | POA: Diagnosis not present

## 2012-11-05 DIAGNOSIS — F5105 Insomnia due to other mental disorder: Secondary | ICD-10-CM

## 2012-11-05 DIAGNOSIS — E559 Vitamin D deficiency, unspecified: Secondary | ICD-10-CM

## 2012-11-05 MED ORDER — OLANZAPINE 15 MG PO TABS: 15.0000 mg | ORAL_TABLET | Freq: Every day | ORAL | Status: DC

## 2012-11-05 MED ORDER — AMITRIPTYLINE HCL 75 MG PO TABS: 75.0000 mg | ORAL_TABLET | Freq: Every day | ORAL | Status: DC

## 2012-11-05 MED ORDER — TEMAZEPAM 22.5 MG PO CAPS: 22.5000 mg | ORAL_CAPSULE | Freq: Every evening | ORAL | Status: DC | PRN

## 2012-11-05 MED ORDER — HYDROXYZINE HCL 50 MG PO TABS: 50.0000 mg | ORAL_TABLET | Freq: Three times a day (TID) | ORAL | Status: DC

## 2012-11-05 MED ORDER — FLUOXETINE HCL 20 MG PO CAPS: 60.0000 mg | ORAL_CAPSULE | Freq: Every day | ORAL | Status: DC
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
## 2012-11-05 NOTE — Patient Instructions (Signed)
Set a timer for 8 or a certain number minutes and walk for that amount of time in the house or in the yard.  Mark the number of minutes on a calendar for that day.  Do that every day this week.  Then next week increase the time by 1 minutes and then mark the calendar with the number of minutes for that day.  Each week increase your exercise by one minute.  Keep a record of this so you can see the progress you are making.  Do this every day, just like eating and sleeping.  It is good for pain control, depression, and for your soul/spirit.  Bring the record in for your next visit so we can talk about your effort and how you feel with the new exercise program going and working for you.  Call if problems or concerns.

## 2012-11-06 ENCOUNTER — Ambulatory Visit (HOSPITAL_COMMUNITY): Payer: Self-pay | Admitting: Psychiatry

## 2012-11-07 ENCOUNTER — Telehealth (HOSPITAL_COMMUNITY): Payer: Self-pay | Admitting: Psychiatry

## 2012-11-07 DIAGNOSIS — S239XXA Sprain of unspecified parts of thorax, initial encounter: Secondary | ICD-10-CM | POA: Diagnosis not present

## 2012-11-07 DIAGNOSIS — S139XXA Sprain of joints and ligaments of unspecified parts of neck, initial encounter: Secondary | ICD-10-CM | POA: Diagnosis not present

## 2012-11-07 NOTE — Telephone Encounter (Addendum)
Fax request for clomipramine. Patient has filled a prescription for clomipramine 50 mg on 10/21/2012. However, she was first prescribed this medication after her inpatient admission and filled it on 02/28/2012.  Reviewed Dr. Tilman Neat notes, it does not appear that he instructed the patient to continue this medication.  I called the patient, phone kept ringing and did not go to voicemail.  I asked the pharmacist to have the patient call Dr. Dan Humphreys on Monday, and did not authorize a refill, the pharmacist reports she spoke to the patient and instructed her to call the clinic.

## 2012-11-12 DIAGNOSIS — S139XXA Sprain of joints and ligaments of unspecified parts of neck, initial encounter: Secondary | ICD-10-CM | POA: Diagnosis not present

## 2012-11-12 DIAGNOSIS — S239XXA Sprain of unspecified parts of thorax, initial encounter: Secondary | ICD-10-CM | POA: Diagnosis not present

## 2012-11-13 ENCOUNTER — Ambulatory Visit (INDEPENDENT_AMBULATORY_CARE_PROVIDER_SITE_OTHER): Payer: BC Managed Care – PPO | Admitting: Psychiatry

## 2012-11-13 DIAGNOSIS — F4310 Post-traumatic stress disorder, unspecified: Secondary | ICD-10-CM | POA: Diagnosis not present

## 2012-11-13 DIAGNOSIS — F429 Obsessive-compulsive disorder, unspecified: Secondary | ICD-10-CM

## 2012-11-13 DIAGNOSIS — F329 Major depressive disorder, single episode, unspecified: Secondary | ICD-10-CM | POA: Diagnosis not present

## 2012-11-13 NOTE — Patient Instructions (Signed)
Continue to use relaxation techiques - diaphragmatic breathing, meditation  Increase and maintained involvement in activity use activity charts and bring to next session
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
## 2012-11-14 ENCOUNTER — Ambulatory Visit: Payer: Medicare Other | Admitting: Family Medicine

## 2012-11-14 ENCOUNTER — Telehealth (HOSPITAL_COMMUNITY): Payer: Self-pay | Admitting: Psychiatry

## 2012-11-14 DIAGNOSIS — F429 Obsessive-compulsive disorder, unspecified: Secondary | ICD-10-CM

## 2012-11-14 DIAGNOSIS — S139XXA Sprain of joints and ligaments of unspecified parts of neck, initial encounter: Secondary | ICD-10-CM | POA: Diagnosis not present

## 2012-11-14 DIAGNOSIS — S239XXA Sprain of unspecified parts of thorax, initial encounter: Secondary | ICD-10-CM | POA: Diagnosis not present

## 2012-11-14 MED ORDER — CLOMIPRAMINE HCL 50 MG PO CAPS: 200.0000 mg | ORAL_CAPSULE | Freq: Every day | ORAL | Status: DC

## 2012-11-14 NOTE — Telephone Encounter (Signed)
Filled script pre pt request.

## 2012-11-17 ENCOUNTER — Ambulatory Visit: Payer: Medicare Other | Admitting: Family Medicine

## 2012-11-18 DIAGNOSIS — S139XXA Sprain of joints and ligaments of unspecified parts of neck, initial encounter: Secondary | ICD-10-CM | POA: Diagnosis not present

## 2012-11-18 DIAGNOSIS — S239XXA Sprain of unspecified parts of thorax, initial encounter: Secondary | ICD-10-CM | POA: Diagnosis not present

## 2012-11-20 ENCOUNTER — Encounter: Payer: Self-pay | Admitting: Family Medicine

## 2012-11-20 ENCOUNTER — Ambulatory Visit (INDEPENDENT_AMBULATORY_CARE_PROVIDER_SITE_OTHER): Payer: BC Managed Care – PPO | Admitting: Psychiatry

## 2012-11-20 ENCOUNTER — Ambulatory Visit (INDEPENDENT_AMBULATORY_CARE_PROVIDER_SITE_OTHER): Payer: BC Managed Care – PPO | Admitting: Family Medicine

## 2012-11-20 VITALS — BP 126/83 | HR 78 | Wt 245.2 lb

## 2012-11-20 DIAGNOSIS — G5620 Lesion of ulnar nerve, unspecified upper limb: Secondary | ICD-10-CM | POA: Diagnosis not present

## 2012-11-20 DIAGNOSIS — F429 Obsessive-compulsive disorder, unspecified: Secondary | ICD-10-CM | POA: Diagnosis not present

## 2012-11-20 DIAGNOSIS — G894 Chronic pain syndrome: Secondary | ICD-10-CM

## 2012-11-20 DIAGNOSIS — F4310 Post-traumatic stress disorder, unspecified: Secondary | ICD-10-CM | POA: Diagnosis not present

## 2012-11-20 DIAGNOSIS — G609 Hereditary and idiopathic neuropathy, unspecified: Secondary | ICD-10-CM | POA: Diagnosis not present

## 2012-11-20 DIAGNOSIS — F329 Major depressive disorder, single episode, unspecified: Secondary | ICD-10-CM | POA: Diagnosis not present

## 2012-11-20 DIAGNOSIS — R1033 Periumbilical pain: Secondary | ICD-10-CM | POA: Diagnosis not present

## 2012-11-20 DIAGNOSIS — K219 Gastro-esophageal reflux disease without esophagitis: Secondary | ICD-10-CM | POA: Diagnosis not present

## 2012-11-20 NOTE — Patient Instructions (Addendum)
Continue using daily schedule  Identify and rank order common thinking errors  Use three minute therapy handouts   Continue to ue relaxation techniques
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
## 2012-11-20 NOTE — Progress Notes (Signed)
Subjective:    Patient ID: [name], female    DOB: [birth year], 32 y.o.   MRN: [id number]  HPI Feeling ongoing nausea. Yet decent appetitie. Trying hard to lose weight. Continues to have ongoing problems with chronic pain. Sees a specialist for this. Going to see dr dabbs, in hopes of nutr support. Next visit specialist--gyn tomorrow, [facility] Concerned about the firmness at the site of Port-A-Cath. Review of Systems Positive nausea but no vomiting. States diminished appetite. Yet has gained 5 pounds since last visit. ROS otherwise negative    Objective:   Physical Exam Alert no acute distress. Very talkative about problems. Lungs clear. Heart regular in rhythm. Port-A-Cath scar appears to be within normal limits.     Assessment & Plan:  Impression chronic nausea. #2 weight loss attempts at this time failing in her attempts. Despite regular exercise and diet per patient plan patient she is seen to see Dr. Magnus Sinning shortly. We'll check one more month for supervised weight loss attempts. Diet and exercise discussed at length. WSL

## 2012-11-21 DIAGNOSIS — B373 Candidiasis of vulva and vagina: Secondary | ICD-10-CM | POA: Diagnosis not present

## 2012-11-21 DIAGNOSIS — G894 Chronic pain syndrome: Secondary | ICD-10-CM | POA: Diagnosis not present

## 2012-11-21 DIAGNOSIS — R109 Unspecified abdominal pain: Secondary | ICD-10-CM | POA: Diagnosis not present

## 2012-11-21 DIAGNOSIS — E559 Vitamin D deficiency, unspecified: Secondary | ICD-10-CM | POA: Diagnosis not present

## 2012-11-21 DIAGNOSIS — G8929 Other chronic pain: Secondary | ICD-10-CM | POA: Diagnosis not present

## 2012-11-21 DIAGNOSIS — IMO0002 Reserved for concepts with insufficient information to code with codable children: Secondary | ICD-10-CM | POA: Diagnosis not present

## 2012-11-21 DIAGNOSIS — Z01419 Encounter for gynecological examination (general) (routine) without abnormal findings: Secondary | ICD-10-CM | POA: Diagnosis not present

## 2012-11-28 ENCOUNTER — Ambulatory Visit (HOSPITAL_COMMUNITY): Payer: Self-pay | Admitting: Psychiatry

## 2012-11-28 DIAGNOSIS — S239XXA Sprain of unspecified parts of thorax, initial encounter: Secondary | ICD-10-CM | POA: Diagnosis not present

## 2012-11-28 DIAGNOSIS — S139XXA Sprain of joints and ligaments of unspecified parts of neck, initial encounter: Secondary | ICD-10-CM | POA: Diagnosis not present

## 2012-12-01 ENCOUNTER — Ambulatory Visit (INDEPENDENT_AMBULATORY_CARE_PROVIDER_SITE_OTHER): Payer: BC Managed Care – PPO | Admitting: Psychiatry

## 2012-12-01 DIAGNOSIS — F4310 Post-traumatic stress disorder, unspecified: Secondary | ICD-10-CM | POA: Diagnosis not present

## 2012-12-01 DIAGNOSIS — F429 Obsessive-compulsive disorder, unspecified: Secondary | ICD-10-CM | POA: Diagnosis not present

## 2012-12-01 DIAGNOSIS — F329 Major depressive disorder, single episode, unspecified: Secondary | ICD-10-CM | POA: Diagnosis not present

## 2012-12-01 DIAGNOSIS — S239XXA Sprain of unspecified parts of thorax, initial encounter: Secondary | ICD-10-CM | POA: Diagnosis not present

## 2012-12-01 DIAGNOSIS — S139XXA Sprain of joints and ligaments of unspecified parts of neck, initial encounter: Secondary | ICD-10-CM | POA: Diagnosis not present
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
## 2012-12-02 NOTE — Patient Instructions (Signed)
Discussed orally 

## 2012-12-03 ENCOUNTER — Ambulatory Visit (HOSPITAL_COMMUNITY): Payer: Self-pay | Admitting: Psychiatry

## 2012-12-04 ENCOUNTER — Encounter (HOSPITAL_COMMUNITY): Payer: Self-pay | Admitting: Psychiatry

## 2012-12-04 ENCOUNTER — Ambulatory Visit (INDEPENDENT_AMBULATORY_CARE_PROVIDER_SITE_OTHER): Payer: BC Managed Care – PPO | Admitting: Psychiatry

## 2012-12-04 VITALS — Wt 244.0 lb

## 2012-12-04 DIAGNOSIS — F418 Other specified anxiety disorders: Secondary | ICD-10-CM

## 2012-12-04 DIAGNOSIS — F5105 Insomnia due to other mental disorder: Secondary | ICD-10-CM

## 2012-12-04 DIAGNOSIS — F322 Major depressive disorder, single episode, severe without psychotic features: Secondary | ICD-10-CM

## 2012-12-04 MED ORDER — TEMAZEPAM 22.5 MG PO CAPS: 22.5000 mg | ORAL_CAPSULE | Freq: Every evening | ORAL | Status: DC | PRN
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
## 2012-12-09 ENCOUNTER — Ambulatory Visit (HOSPITAL_COMMUNITY): Payer: Self-pay | Admitting: Psychiatry

## 2012-12-09 DIAGNOSIS — S139XXA Sprain of joints and ligaments of unspecified parts of neck, initial encounter: Secondary | ICD-10-CM | POA: Diagnosis not present

## 2012-12-09 DIAGNOSIS — S239XXA Sprain of unspecified parts of thorax, initial encounter: Secondary | ICD-10-CM | POA: Diagnosis not present

## 2012-12-10 ENCOUNTER — Ambulatory Visit (INDEPENDENT_AMBULATORY_CARE_PROVIDER_SITE_OTHER): Payer: BC Managed Care – PPO | Admitting: Psychiatry

## 2012-12-10 DIAGNOSIS — F4310 Post-traumatic stress disorder, unspecified: Secondary | ICD-10-CM

## 2012-12-10 DIAGNOSIS — F329 Major depressive disorder, single episode, unspecified: Secondary | ICD-10-CM | POA: Diagnosis not present

## 2012-12-10 DIAGNOSIS — F429 Obsessive-compulsive disorder, unspecified: Secondary | ICD-10-CM

## 2012-12-10 NOTE — Patient Instructions (Signed)
Discussed orally 
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
## 2012-12-16 DIAGNOSIS — S139XXA Sprain of joints and ligaments of unspecified parts of neck, initial encounter: Secondary | ICD-10-CM | POA: Diagnosis not present

## 2012-12-16 DIAGNOSIS — L719 Rosacea, unspecified: Secondary | ICD-10-CM | POA: Diagnosis not present

## 2012-12-16 DIAGNOSIS — L910 Hypertrophic scar: Secondary | ICD-10-CM | POA: Diagnosis not present

## 2012-12-16 DIAGNOSIS — L259 Unspecified contact dermatitis, unspecified cause: Secondary | ICD-10-CM | POA: Diagnosis not present

## 2012-12-16 DIAGNOSIS — S239XXA Sprain of unspecified parts of thorax, initial encounter: Secondary | ICD-10-CM | POA: Diagnosis not present

## 2012-12-19 ENCOUNTER — Ambulatory Visit (HOSPITAL_COMMUNITY): Payer: Self-pay | Admitting: Psychiatry

## 2012-12-23 DIAGNOSIS — S239XXA Sprain of unspecified parts of thorax, initial encounter: Secondary | ICD-10-CM | POA: Diagnosis not present

## 2012-12-23 DIAGNOSIS — S139XXA Sprain of joints and ligaments of unspecified parts of neck, initial encounter: Secondary | ICD-10-CM | POA: Diagnosis not present

## 2012-12-24 ENCOUNTER — Ambulatory Visit (INDEPENDENT_AMBULATORY_CARE_PROVIDER_SITE_OTHER): Payer: BC Managed Care – PPO | Admitting: Family Medicine

## 2012-12-24 ENCOUNTER — Encounter: Payer: Self-pay | Admitting: Family Medicine

## 2012-12-24 VITALS — BP 128/80 | HR 70 | Wt 247.0 lb

## 2012-12-24 DIAGNOSIS — G43909 Migraine, unspecified, not intractable, without status migrainosus: Secondary | ICD-10-CM | POA: Insufficient documentation

## 2012-12-24 DIAGNOSIS — K912 Postsurgical malabsorption, not elsewhere classified: Secondary | ICD-10-CM | POA: Diagnosis not present

## 2012-12-24 DIAGNOSIS — G894 Chronic pain syndrome: Secondary | ICD-10-CM

## 2012-12-24 NOTE — Progress Notes (Signed)
Subjective:    Patient ID: [name], female    DOB: [birth year], 32 y.o.   MRN: [id number]  HPI Pos history of migr headaches.  Takes verapamil preventive, and four dose per week for migr headache.  Frontal, nausea, light bothers eyes.  Uses hypothermic skull caps--does hel some.  Patient still having considerable nausea with her chronic reflux. Review of Systems Minimal frank vomiting no fever no chills no change in abdominal pain.    Objective:   Physical Exam Alert HEENT normal. Lungs clear. Heart regular rate rhythm. Neuro exam intact. Abdomen stable.       Assessment & Plan:  Impression chronic abdominal pain both functional and anatomic. #2 morbid obesity does far unable to lose weight with standard measures. #3 migraine headaches worsening discuss with patient. Plan encouraged to get back with her neurologist. Weight loss measures discussed. Followup as needed. This completes a six-month monthly visit for hopes for weight loss. Unfortunately this has not transpired. WSL

## 2012-12-29 ENCOUNTER — Ambulatory Visit (INDEPENDENT_AMBULATORY_CARE_PROVIDER_SITE_OTHER): Payer: BC Managed Care – PPO | Admitting: Nurse Practitioner

## 2012-12-29 ENCOUNTER — Encounter: Payer: Self-pay | Admitting: Nurse Practitioner

## 2012-12-29 VITALS — BP 126/94 | HR 90 | Wt 256.2 lb

## 2012-12-29 DIAGNOSIS — I998 Other disorder of circulatory system: Secondary | ICD-10-CM | POA: Diagnosis not present

## 2012-12-29 DIAGNOSIS — R300 Dysuria: Secondary | ICD-10-CM

## 2012-12-29 DIAGNOSIS — Z7901 Long term (current) use of anticoagulants: Secondary | ICD-10-CM

## 2012-12-29 DIAGNOSIS — R58 Hemorrhage, not elsewhere classified: Secondary | ICD-10-CM

## 2012-12-29 DIAGNOSIS — R319 Hematuria, unspecified: Secondary | ICD-10-CM

## 2012-12-29 LAB — POCT URINALYSIS DIPSTICK: Spec Grav, UA: <=1.005

## 2012-12-29 LAB — POCT UA - MICROSCOPIC ONLY
Bacteria, U Microscopic: 0
RBC, urine, microscopic: 0
WBC, Ur, HPF, POC: 0

## 2012-12-29 MED ORDER — SULFAMETHOXAZOLE-TMP DS 800-160 MG PO TABS: 1.0000 | ORAL_TABLET | Freq: Two times a day (BID) | ORAL | Status: DC

## 2012-12-29 NOTE — Patient Instructions (Signed)
20 cm x 10 cm bruise left abdomen

## 2012-12-30 ENCOUNTER — Encounter: Payer: Self-pay | Admitting: Nurse Practitioner

## 2012-12-30 DIAGNOSIS — Z7901 Long term (current) use of anticoagulants: Secondary | ICD-10-CM | POA: Insufficient documentation

## 2012-12-30 NOTE — Progress Notes (Signed)
Subjective:  Presents complaints of nausea and vomiting over the past couple of days. Urine was pink in color yesterday. Patient has brought a picture of her urine. No dysuria. No urgency. No frequency. Complex medical history. Has not had a kidney stone in several years. Currently on Arixtra, regular followup with her hematologist. Usually gets a small ecchymotic area in her abdomen from her injections. Has a large progressive ecchymotic area on the left abdomen for the past week. This is in the area of the previous injection. No excessive bruising or bleeding otherwise. No history of injury. Some right mid back pain into the flank area. No fever.  Objective:   BP 126/94  Pulse 90  Wt 256 lb 3.2 oz (116.212 kg)  BMI 43.63 kg/m2 NAD. Alert, oriented. Lungs clear. Heart regular rhythm. Positive right CVA/flank area tenderness. Abdomen obese soft nondistended with a 20 x 10 cm dark ecchymotic area along the left lower abdomen. The Center is pale and tender to palpation. A few smaller ecchymotic areas noted on the right. Urine microscopic clear except for rare epithelial cell, note very dilute.  Assessment:Hematuria  Dysuria - Plan: POCT urinalysis dipstick, POCT UA - Microscopic Only  Ecchymosis  Chronic anticoagulation  Plan: Meds ordered this encounter  Medications  . sulfamethoxazole-trimethoprim (BACTRIM DS) 800-160 MG per tablet    Sig: Take 1 tablet by mouth 2 (two) times daily.    Dispense:  14 tablet    Refill:  0    Order Specific Question:  Supervising Provider    Answer:  Merlyn Albert [[id number]]   Warning signs reviewed, including signs of kidney stone. Patient to contact her hematologist today regarding ecchymotic area and possible blood in her urine. If vomiting and diarrhea persist, contact her specialist at [facility] as planned. Further followup based on CNS results.

## 2012-12-30 NOTE — Assessment & Plan Note (Signed)
Contact hematologist today regarding large ecchymotic area as well as possible hematuria.

## 2012-12-31 DIAGNOSIS — S139XXA Sprain of joints and ligaments of unspecified parts of neck, initial encounter: Secondary | ICD-10-CM | POA: Diagnosis not present

## 2012-12-31 DIAGNOSIS — Q141 Congenital malformation of retina: Secondary | ICD-10-CM | POA: Diagnosis not present

## 2012-12-31 DIAGNOSIS — S239XXA Sprain of unspecified parts of thorax, initial encounter: Secondary | ICD-10-CM | POA: Diagnosis not present

## 2012-12-31 DIAGNOSIS — H04129 Dry eye syndrome of unspecified lacrimal gland: Secondary | ICD-10-CM | POA: Diagnosis not present

## 2013-01-02 ENCOUNTER — Other Ambulatory Visit: Payer: Self-pay | Admitting: Nurse Practitioner

## 2013-01-02 DIAGNOSIS — R609 Edema, unspecified: Secondary | ICD-10-CM | POA: Diagnosis not present

## 2013-01-02 MED ORDER — POTASSIUM CHLORIDE CRYS ER 20 MEQ PO TBCR: 20.0000 meq | EXTENDED_RELEASE_TABLET | Freq: Every day | ORAL | Status: DC

## 2013-01-02 MED ORDER — FUROSEMIDE 40 MG PO TABS: 40.0000 mg | ORAL_TABLET | Freq: Every day | ORAL | Status: DC

## 2013-01-02 NOTE — Progress Notes (Signed)
Phone call from patient who is out of town at [employer].  Has significant swelling in both ankles.  Went to urgent care locally.  Diagnosed with peripheral edema.  Has gained 25 pounds over 2 months.  Slight SOB but no significant breathing problems.  No orthopnea.  No cough.    Meds ordered this encounter  Medications  . furosemide (LASIX) 40 MG tablet    Sig: Take 1 tablet (40 mg total) by mouth daily.    Dispense:  30 tablet    Refill:  0    Order Specific Question:  Supervising Provider    Answer:  Merlyn Albert [[id number]]  . potassium chloride SA (K-DUR,KLOR-CON) 20 MEQ tablet    Sig: Take 1 tablet (20 mEq total) by mouth daily.    Dispense:  30 tablet    Refill:  0    Order Specific Question:  Supervising Provider    Answer:  Merlyn Albert [[id number]]   Avoid sodium.  Keep feet elevated as much as possible.  Warning signs reviewed.  OV on Monday, go to ED sooner is worse.

## 2013-01-05 ENCOUNTER — Ambulatory Visit (INDEPENDENT_AMBULATORY_CARE_PROVIDER_SITE_OTHER): Payer: BC Managed Care – PPO | Admitting: Nurse Practitioner

## 2013-01-05 ENCOUNTER — Encounter: Payer: Self-pay | Admitting: Nurse Practitioner

## 2013-01-05 VITALS — BP 128/100 | HR 120 | Ht 64.0 in | Wt 244.6 lb

## 2013-01-05 DIAGNOSIS — R5381 Other malaise: Secondary | ICD-10-CM | POA: Diagnosis not present

## 2013-01-05 DIAGNOSIS — R609 Edema, unspecified: Secondary | ICD-10-CM | POA: Diagnosis not present

## 2013-01-05 DIAGNOSIS — R0609 Other forms of dyspnea: Secondary | ICD-10-CM | POA: Diagnosis not present

## 2013-01-05 DIAGNOSIS — R5383 Other fatigue: Secondary | ICD-10-CM

## 2013-01-05 DIAGNOSIS — R0989 Other specified symptoms and signs involving the circulatory and respiratory systems: Secondary | ICD-10-CM

## 2013-01-05 DIAGNOSIS — R0600 Dyspnea, unspecified: Secondary | ICD-10-CM

## 2013-01-05 DIAGNOSIS — E119 Type 2 diabetes mellitus without complications: Secondary | ICD-10-CM | POA: Diagnosis not present

## 2013-01-05 MED ORDER — POTASSIUM CHLORIDE CRYS ER 20 MEQ PO TBCR: 20.0000 meq | EXTENDED_RELEASE_TABLET | Freq: Every day | ORAL | Status: DC

## 2013-01-05 MED ORDER — FUROSEMIDE 40 MG PO TABS: 40.0000 mg | ORAL_TABLET | Freq: Every day | ORAL | Status: DC

## 2013-01-06 ENCOUNTER — Other Ambulatory Visit: Payer: Self-pay | Admitting: Nurse Practitioner

## 2013-01-06 ENCOUNTER — Encounter: Payer: Self-pay | Admitting: Nurse Practitioner

## 2013-01-06 DIAGNOSIS — R7989 Other specified abnormal findings of blood chemistry: Secondary | ICD-10-CM

## 2013-01-06 DIAGNOSIS — R609 Edema, unspecified: Secondary | ICD-10-CM | POA: Insufficient documentation

## 2013-01-06 DIAGNOSIS — R748 Abnormal levels of other serum enzymes: Secondary | ICD-10-CM

## 2013-01-06 LAB — BASIC METABOLIC PANEL
BUN: 10 mg/dL (ref 6–23)
CO2: 22 mEq/L (ref 19–32)
Calcium: 9.4 mg/dL (ref 8.4–10.5)
Chloride: 100 mEq/L (ref 96–112)
Creat: 0.77 mg/dL (ref 0.50–1.10)
Potassium: 3.9 mEq/L (ref 3.5–5.3)

## 2013-01-06 LAB — CBC WITH DIFFERENTIAL/PLATELET
Basophils Absolute: 0 10*3/uL (ref 0.0–0.1)
Eosinophils Absolute: 0.1 10*3/uL (ref 0.0–0.7)
Eosinophils Relative: 1 % (ref 0–5)
HCT: 40.9 % (ref 36.0–46.0)
Hemoglobin: 13.8 g/dL (ref 12.0–15.0)
Lymphocytes Relative: 39 % (ref 12–46)
Lymphs Abs: 3.5 10*3/uL (ref 0.7–4.0)
MCV: 83.8 fL (ref 78.0–100.0)
Monocytes Absolute: 0.5 10*3/uL (ref 0.1–1.0)
Monocytes Relative: 6 % (ref 3–12)
RBC: 4.88 MIL/uL (ref 3.87–5.11)
RDW: 14 % (ref 11.5–15.5)
WBC: 9 10*3/uL (ref 4.0–10.5)

## 2013-01-06 LAB — BRAIN NATRIURETIC PEPTIDE: Brain Natriuretic Peptide: <2 pg/mL (ref 0.0–100.0)

## 2013-01-06 LAB — HEPATIC FUNCTION PANEL
ALT: 69 U/L — ABNORMAL HIGH (ref 0–35)
AST: 51 U/L — ABNORMAL HIGH (ref 0–37)
Total Protein: 7.2 g/dL (ref 6.0–8.3)

## 2013-01-06 LAB — TSH: TSH: 5.008 u[IU]/mL — ABNORMAL HIGH (ref 0.350–4.500)

## 2013-01-06 NOTE — Progress Notes (Signed)
Subjective:  Presents for recheck after complaints of edema this weekend. Has had a constant weight gain for past several months despite exercise and weight loss efforts. Edema much improved over the past 3 days since starting Lasix. Her weight went from 258-244 pounds. No orthopnea. Slight shortness of breath at times. No chest pain/ischemic type pain. Mild palpitations. No fever. No cough. No excessive sodium intake. Continues to have fatigue worse on some days, continues followup with her psychiatrist and counselor.  Objective:   BP 128/100  Pulse 120  Ht 5\' 4"  (1.626 m)  Wt 244 lb 9.6 oz (110.95 kg)  BMI 41.96 kg/m2 NAD. Alert, oriented. Thyroid normal limit to palpation and nontender. Lungs clear. Heart regular rate rhythm. No murmur or gallop noted. BP on recheck 122/98 right arm sitting. Pulse 112 and regular. No JVD noted. Lower extremities no edema.  Assessment:Peripheral edema - Plan: Basic metabolic panel, Hepatic function panel, TSH, Brain natriuretic peptide, Basic metabolic panel, Hepatic function panel, TSH, Brain natriuretic peptide  Dyspnea - Plan: CBC with Differential, Brain natriuretic peptide, CBC with Differential, Brain natriuretic peptide  Fatigue - Plan: CBC with Differential, Basic metabolic panel, Hepatic function panel, TSH, Brain natriuretic peptide, CBC with Differential, Basic metabolic panel, Hepatic function panel, TSH, Brain natriuretic peptide  Type 2 diabetes mellitus - Plan: Microalbumin, urine, Hemoglobin A1c, Microalbumin, urine, Hemoglobin A1c  Meds ordered this encounter  Medications  . furosemide (LASIX) 40 MG tablet    Sig: Take 1 tablet (40 mg total) by mouth daily.    Dispense:  30 tablet    Refill:  2    Order Specific Question:  Supervising Provider    Answer:  Merlyn Albert [[id number]]  . potassium chloride SA (K-DUR,KLOR-CON) 20 MEQ tablet    Sig: Take 1 tablet (20 mEq total) by mouth daily.    Dispense:  30 tablet    Refill:  2    Order  Specific Question:  Supervising Provider    Answer:  Merlyn Albert [[id number]]   Continue to work on weight. Lab work pending. Call back if any further problems.

## 2013-01-06 NOTE — Assessment & Plan Note (Signed)
Continue Lasix 40 mg daily and K-Dur 20 milliequivalents daily. Lab work pending.

## 2013-01-09 DIAGNOSIS — S239XXA Sprain of unspecified parts of thorax, initial encounter: Secondary | ICD-10-CM | POA: Diagnosis not present

## 2013-01-09 DIAGNOSIS — S139XXA Sprain of joints and ligaments of unspecified parts of neck, initial encounter: Secondary | ICD-10-CM | POA: Diagnosis not present

## 2013-01-12 DIAGNOSIS — D649 Anemia, unspecified: Secondary | ICD-10-CM | POA: Diagnosis not present

## 2013-01-12 DIAGNOSIS — M7989 Other specified soft tissue disorders: Secondary | ICD-10-CM | POA: Diagnosis not present

## 2013-01-12 DIAGNOSIS — I82409 Acute embolism and thrombosis of unspecified deep veins of unspecified lower extremity: Secondary | ICD-10-CM | POA: Diagnosis not present

## 2013-01-13 ENCOUNTER — Encounter: Payer: Self-pay | Admitting: Nurse Practitioner

## 2013-01-13 ENCOUNTER — Ambulatory Visit (INDEPENDENT_AMBULATORY_CARE_PROVIDER_SITE_OTHER): Payer: BC Managed Care – PPO | Admitting: Nurse Practitioner

## 2013-01-13 VITALS — BP 140/108 | HR 110 | Ht 64.0 in | Wt 247.0 lb

## 2013-01-13 DIAGNOSIS — R0600 Dyspnea, unspecified: Secondary | ICD-10-CM

## 2013-01-13 DIAGNOSIS — R609 Edema, unspecified: Secondary | ICD-10-CM | POA: Diagnosis not present

## 2013-01-13 DIAGNOSIS — R0601 Orthopnea: Secondary | ICD-10-CM

## 2013-01-14 ENCOUNTER — Telehealth: Payer: Self-pay | Admitting: Nurse Practitioner

## 2013-01-14 ENCOUNTER — Encounter: Payer: Self-pay | Admitting: Nurse Practitioner

## 2013-01-14 NOTE — Telephone Encounter (Signed)
Copy of bloodwork faxed to hematologist at [facility].

## 2013-01-14 NOTE — Assessment & Plan Note (Signed)
Plan: Patient has an appointment with cardiology on 8/25 at [facility] go ahead and order cardiac echo. May increase Lasix to 80 mg on the day the swelling is more intense. Warning signs reviewed. Call back sooner if symptoms worsen. Patient given a copy of her most recent lab work to take for her consultation. Has also requested we fax a copy to Dr. Jolyne Loa her hematologist at [facility].

## 2013-01-14 NOTE — Progress Notes (Signed)
Subjective:  Presents for recheck of her edema. Symptoms had improved with Lasix 40 mg daily. 2 days ago patient began having significant swelling in the hands and feet. We increased her Lasix to 80 mg daily for 8/10 and 8/11. Symptoms did improve. Is taking her potassium as well. Now experiencing some shortness of breath when she lays down. No cough. Describes as labored breathing with slight wheeze. Also frequent palpitations.  Objective:   BP 140/108  Pulse 110  Ht 5\' 4"  (1.626 m)  Wt 247 lb (112.038 kg)  BMI 42.38 kg/m2 NAD. Alert, oriented. Lungs clear. Heart regular rate rhythm. Lower extremities trace nonpitting edema, small amount of localized nontender edema at the right lateral malleolus but no generalized edema noted.  Assessment:Nocturnal dyspnea - Plan: 2D Echocardiogram without contrast  Peripheral edema - Plan: 2D Echocardiogram without contrast  Plan: Patient has an appointment with cardiology on 8/25 at [facility] go ahead and order cardiac echo. May increase Lasix to 80 mg on the day the swelling is more intense. Warning signs reviewed. Call back sooner if symptoms worsen. Patient given a copy of her most recent lab work to take for her consultation. Has also requested we fax a copy to Dr. Jolyne Loa her hematologist at [facility].

## 2013-01-14 NOTE — Telephone Encounter (Signed)
Patient requests labs from 8/4 be sent to her hematologist at [facility]

## 2013-01-16 ENCOUNTER — Ambulatory Visit (HOSPITAL_COMMUNITY)
Admission: RE | Admit: 2013-01-16 | Discharge: 2013-01-16 | Disposition: A | Discharge status: Home / Self Care | Payer: BC Managed Care – PPO | Source: Ambulatory Visit | Attending: Family Medicine | Admitting: Family Medicine

## 2013-01-16 DIAGNOSIS — Z8249 Family history of ischemic heart disease and other diseases of the circulatory system: Secondary | ICD-10-CM | POA: Insufficient documentation

## 2013-01-16 DIAGNOSIS — S239XXA Sprain of unspecified parts of thorax, initial encounter: Secondary | ICD-10-CM | POA: Diagnosis not present

## 2013-01-16 DIAGNOSIS — R0609 Other forms of dyspnea: Secondary | ICD-10-CM | POA: Diagnosis not present

## 2013-01-16 DIAGNOSIS — R002 Palpitations: Secondary | ICD-10-CM | POA: Insufficient documentation

## 2013-01-16 DIAGNOSIS — G894 Chronic pain syndrome: Secondary | ICD-10-CM | POA: Diagnosis not present

## 2013-01-16 DIAGNOSIS — R0989 Other specified symptoms and signs involving the circulatory and respiratory systems: Secondary | ICD-10-CM | POA: Insufficient documentation

## 2013-01-16 DIAGNOSIS — R0600 Dyspnea, unspecified: Secondary | ICD-10-CM

## 2013-01-16 DIAGNOSIS — IMO0002 Reserved for concepts with insufficient information to code with codable children: Secondary | ICD-10-CM | POA: Diagnosis not present

## 2013-01-16 DIAGNOSIS — S139XXA Sprain of joints and ligaments of unspecified parts of neck, initial encounter: Secondary | ICD-10-CM | POA: Diagnosis not present

## 2013-01-16 DIAGNOSIS — G8929 Other chronic pain: Secondary | ICD-10-CM | POA: Diagnosis not present

## 2013-01-16 DIAGNOSIS — R109 Unspecified abdominal pain: Secondary | ICD-10-CM | POA: Diagnosis not present

## 2013-01-16 DIAGNOSIS — R609 Edema, unspecified: Secondary | ICD-10-CM

## 2013-01-16 NOTE — Progress Notes (Signed)
*  PRELIMINARY RESULTS* Echocardiogram 2D Echocardiogram has been performed.  Conrad [place] 01/16/2013, 2:13 PM

## 2013-01-21 DIAGNOSIS — E538 Deficiency of other specified B group vitamins: Secondary | ICD-10-CM | POA: Diagnosis not present

## 2013-01-21 DIAGNOSIS — H04129 Dry eye syndrome of unspecified lacrimal gland: Secondary | ICD-10-CM | POA: Diagnosis not present

## 2013-01-21 DIAGNOSIS — K219 Gastro-esophageal reflux disease without esophagitis: Secondary | ICD-10-CM | POA: Diagnosis not present

## 2013-01-21 DIAGNOSIS — R112 Nausea with vomiting, unspecified: Secondary | ICD-10-CM | POA: Diagnosis not present

## 2013-01-21 DIAGNOSIS — R7989 Other specified abnormal findings of blood chemistry: Secondary | ICD-10-CM | POA: Diagnosis not present

## 2013-01-22 ENCOUNTER — Ambulatory Visit (HOSPITAL_COMMUNITY)
Admission: RE | Admit: 2013-01-22 | Discharge: 2013-01-22 | Disposition: A | Discharge status: Home / Self Care | Payer: BC Managed Care – PPO | Source: Ambulatory Visit | Attending: Internal Medicine | Admitting: Internal Medicine

## 2013-01-22 DIAGNOSIS — R0609 Other forms of dyspnea: Secondary | ICD-10-CM | POA: Insufficient documentation

## 2013-01-22 DIAGNOSIS — R0989 Other specified symptoms and signs involving the circulatory and respiratory systems: Secondary | ICD-10-CM | POA: Insufficient documentation

## 2013-01-22 NOTE — Progress Notes (Signed)
*  PRELIMINARY RESULTS* Echocardiogram Contrast study done per Dr. Tenny Craw has been performed.  Conrad [place] 01/22/2013, 3:21 PM

## 2013-01-23 DIAGNOSIS — S139XXA Sprain of joints and ligaments of unspecified parts of neck, initial encounter: Secondary | ICD-10-CM | POA: Diagnosis not present

## 2013-01-23 DIAGNOSIS — S239XXA Sprain of unspecified parts of thorax, initial encounter: Secondary | ICD-10-CM | POA: Diagnosis not present

## 2013-01-26 DIAGNOSIS — R609 Edema, unspecified: Secondary | ICD-10-CM | POA: Diagnosis not present

## 2013-02-04 ENCOUNTER — Ambulatory Visit (HOSPITAL_COMMUNITY): Payer: Self-pay | Admitting: Psychiatry

## 2013-02-10 ENCOUNTER — Encounter: Payer: Self-pay | Admitting: Nurse Practitioner

## 2013-02-10 DIAGNOSIS — Z23 Encounter for immunization: Secondary | ICD-10-CM | POA: Diagnosis not present

## 2013-02-11 ENCOUNTER — Other Ambulatory Visit: Payer: Self-pay | Admitting: Nurse Practitioner

## 2013-02-11 DIAGNOSIS — S139XXA Sprain of joints and ligaments of unspecified parts of neck, initial encounter: Secondary | ICD-10-CM | POA: Diagnosis not present

## 2013-02-11 DIAGNOSIS — S239XXA Sprain of unspecified parts of thorax, initial encounter: Secondary | ICD-10-CM | POA: Diagnosis not present

## 2013-02-11 MED ORDER — FUROSEMIDE 40 MG PO TABS: 40.0000 mg | ORAL_TABLET | Freq: Two times a day (BID) | ORAL | Status: DC

## 2013-02-11 MED ORDER — POTASSIUM CHLORIDE CRYS ER 20 MEQ PO TBCR: 20.0000 meq | EXTENDED_RELEASE_TABLET | Freq: Two times a day (BID) | ORAL | Status: DC

## 2013-02-18 ENCOUNTER — Encounter: Payer: Self-pay | Admitting: Family Medicine

## 2013-02-18 ENCOUNTER — Telehealth: Payer: Self-pay | Admitting: Family Medicine

## 2013-02-18 ENCOUNTER — Ambulatory Visit (INDEPENDENT_AMBULATORY_CARE_PROVIDER_SITE_OTHER): Payer: BC Managed Care – PPO | Admitting: Family Medicine

## 2013-02-18 VITALS — BP 120/88 | Ht 64.0 in | Wt 248.0 lb

## 2013-02-18 DIAGNOSIS — K912 Postsurgical malabsorption, not elsewhere classified: Secondary | ICD-10-CM

## 2013-02-18 DIAGNOSIS — R5381 Other malaise: Secondary | ICD-10-CM

## 2013-02-18 DIAGNOSIS — K760 Fatty (change of) liver, not elsewhere classified: Secondary | ICD-10-CM

## 2013-02-18 DIAGNOSIS — I878 Other specified disorders of veins: Secondary | ICD-10-CM

## 2013-02-18 DIAGNOSIS — D649 Anemia, unspecified: Secondary | ICD-10-CM

## 2013-02-18 DIAGNOSIS — R1033 Periumbilical pain: Secondary | ICD-10-CM

## 2013-02-18 DIAGNOSIS — K7689 Other specified diseases of liver: Secondary | ICD-10-CM

## 2013-02-18 DIAGNOSIS — R609 Edema, unspecified: Secondary | ICD-10-CM

## 2013-02-18 DIAGNOSIS — Z79899 Other long term (current) drug therapy: Secondary | ICD-10-CM

## 2013-02-18 DIAGNOSIS — Z23 Encounter for immunization: Secondary | ICD-10-CM

## 2013-02-18 DIAGNOSIS — I872 Venous insufficiency (chronic) (peripheral): Secondary | ICD-10-CM

## 2013-02-18 MED ORDER — CYANOCOBALAMIN 1000 MCG/ML IJ SOLN
1000.0000 ug | Freq: Once | INTRAMUSCULAR | Status: AC
  Administered 2013-02-18: 1000 ug via INTRAMUSCULAR

## 2013-02-18 NOTE — Telephone Encounter (Signed)
Pt calling to say that her compression hose you wanted her to buy were going to be $100, [facility] stated she should get a script for it if at all possible

## 2013-02-18 NOTE — Progress Notes (Signed)
Subjective:    Patient ID: [name], female    DOB: [birth year], 32 y.o.   MRN: [id number]  HPI Patient states that she is having swelling in her ankles and feet and it has been going on for about 1-2 months now. She also had an ultrasound done at [facility] and was told that she has fatty liver disease. She wants to discuss restarting her B12 injections and receive a tetanus shot today.   Patient has had swelling in legs and feet. Saw specialist for cardiac workup. Was told reportedly echocardiogram showed good strength of heart.  Scan revealed significant fatty liver.  Blood work revealed a low B12 patient notes fatigue at times.  Each was also advised that her cholesterol was on the high side and she had a cut down her fat intake.  Patient notes to not exercising much this point.   Review of Systems No chest pain no headache baseline abdominal pain no change in urinary or bowel habits.    Objective:   Physical Exam Alert no acute distress. HEENT normal. Lungs clear. Heart regular in rhythm. Ankles 1+ edema. Pulses good sensation intact. Abdomen nontender in epigastrium.       Assessment & Plan:  Impression 1 fatty liver discussed at length. 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 #3 borderline TSH. #4 venous stasis potentially aggravated by some of her medicines but this is the most likely diagnosis with good liver heart and kidney function. Discussed. Definitely do not want to exceed current Lasix dose which is artery on the high side. Plan appropriate blood work followup with [place] then cut down fats in diet. Regular exercise. Bilateral stockings. Easily 25 minutes spent most in discussion. WSL

## 2013-02-19 DIAGNOSIS — K760 Fatty (change of) liver, not elsewhere classified: Secondary | ICD-10-CM | POA: Insufficient documentation

## 2013-02-19 DIAGNOSIS — I878 Other specified disorders of veins: Secondary | ICD-10-CM | POA: Insufficient documentation

## 2013-02-19 NOTE — Telephone Encounter (Signed)
No, I told her to try otc's first..Marland KitchenAnyway, rx hos wis, 30 mm below the knee dx venous staisis

## 2013-02-19 NOTE — Telephone Encounter (Signed)
Rx faxed to [facility].  Patient notified. 

## 2013-02-25 DIAGNOSIS — S139XXA Sprain of joints and ligaments of unspecified parts of neck, initial encounter: Secondary | ICD-10-CM | POA: Diagnosis not present

## 2013-02-25 DIAGNOSIS — L910 Hypertrophic scar: Secondary | ICD-10-CM | POA: Diagnosis not present

## 2013-02-25 DIAGNOSIS — L719 Rosacea, unspecified: Secondary | ICD-10-CM | POA: Diagnosis not present

## 2013-02-25 DIAGNOSIS — S239XXA Sprain of unspecified parts of thorax, initial encounter: Secondary | ICD-10-CM | POA: Diagnosis not present

## 2013-03-04 HISTORY — PX: OTHER SURGICAL HISTORY: SHX169

## 2013-03-16 DIAGNOSIS — S139XXA Sprain of joints and ligaments of unspecified parts of neck, initial encounter: Secondary | ICD-10-CM | POA: Diagnosis not present

## 2013-03-16 DIAGNOSIS — S239XXA Sprain of unspecified parts of thorax, initial encounter: Secondary | ICD-10-CM | POA: Diagnosis not present

## 2013-03-25 ENCOUNTER — Ambulatory Visit: Payer: BC Managed Care – PPO

## 2013-03-25 ENCOUNTER — Ambulatory Visit (INDEPENDENT_AMBULATORY_CARE_PROVIDER_SITE_OTHER): Payer: BC Managed Care – PPO

## 2013-03-25 DIAGNOSIS — D649 Anemia, unspecified: Secondary | ICD-10-CM

## 2013-03-25 DIAGNOSIS — E282 Polycystic ovarian syndrome: Secondary | ICD-10-CM | POA: Insufficient documentation

## 2013-03-25 DIAGNOSIS — H04129 Dry eye syndrome of unspecified lacrimal gland: Secondary | ICD-10-CM | POA: Diagnosis not present

## 2013-03-25 MED ORDER — CYANOCOBALAMIN 1000 MCG/ML IJ SOLN
1000.0000 ug | Freq: Once | INTRAMUSCULAR | Status: AC
  Administered 2013-03-25: 1000 ug via INTRAMUSCULAR

## 2013-03-27 DIAGNOSIS — S139XXA Sprain of joints and ligaments of unspecified parts of neck, initial encounter: Secondary | ICD-10-CM | POA: Diagnosis not present

## 2013-03-27 DIAGNOSIS — S239XXA Sprain of unspecified parts of thorax, initial encounter: Secondary | ICD-10-CM | POA: Diagnosis not present

## 2013-03-28 LAB — HEPATIC FUNCTION PANEL
AST: 45 U/L — ABNORMAL HIGH (ref 0–37)
Albumin: 4.2 g/dL (ref 3.5–5.2)
Alkaline Phosphatase: 73 U/L (ref 39–117)
Total Bilirubin: 0.3 mg/dL (ref 0.3–1.2)
Total Protein: 6.8 g/dL (ref 6.0–8.3)

## 2013-03-30 DIAGNOSIS — F411 Generalized anxiety disorder: Secondary | ICD-10-CM | POA: Diagnosis present

## 2013-03-30 DIAGNOSIS — J9819 Other pulmonary collapse: Secondary | ICD-10-CM | POA: Diagnosis not present

## 2013-03-30 DIAGNOSIS — IMO0002 Reserved for concepts with insufficient information to code with codable children: Secondary | ICD-10-CM | POA: Diagnosis not present

## 2013-03-30 DIAGNOSIS — R000 Tachycardia, unspecified: Secondary | ICD-10-CM | POA: Diagnosis not present

## 2013-03-30 DIAGNOSIS — R339 Retention of urine, unspecified: Secondary | ICD-10-CM | POA: Diagnosis not present

## 2013-03-30 DIAGNOSIS — K911 Postgastric surgery syndromes: Secondary | ICD-10-CM | POA: Diagnosis not present

## 2013-03-30 DIAGNOSIS — R0602 Shortness of breath: Secondary | ICD-10-CM | POA: Diagnosis not present

## 2013-03-30 DIAGNOSIS — N9989 Other postprocedural complications and disorders of genitourinary system: Secondary | ICD-10-CM | POA: Diagnosis not present

## 2013-03-30 DIAGNOSIS — Z6841 Body Mass Index (BMI) 40.0 and over, adult: Secondary | ICD-10-CM | POA: Diagnosis not present

## 2013-04-07 DIAGNOSIS — K911 Postgastric surgery syndromes: Secondary | ICD-10-CM | POA: Insufficient documentation

## 2013-04-09 ENCOUNTER — Other Ambulatory Visit: Payer: Self-pay

## 2013-04-13 ENCOUNTER — Other Ambulatory Visit: Payer: Self-pay | Admitting: Nurse Practitioner

## 2013-04-14 DIAGNOSIS — S239XXA Sprain of unspecified parts of thorax, initial encounter: Secondary | ICD-10-CM | POA: Diagnosis not present

## 2013-04-14 DIAGNOSIS — S139XXA Sprain of joints and ligaments of unspecified parts of neck, initial encounter: Secondary | ICD-10-CM | POA: Diagnosis not present

## 2013-04-15 DIAGNOSIS — Z9109 Other allergy status, other than to drugs and biological substances: Secondary | ICD-10-CM | POA: Diagnosis not present

## 2013-04-15 DIAGNOSIS — F429 Obsessive-compulsive disorder, unspecified: Secondary | ICD-10-CM | POA: Diagnosis present

## 2013-04-15 DIAGNOSIS — G43909 Migraine, unspecified, not intractable, without status migrainosus: Secondary | ICD-10-CM | POA: Diagnosis present

## 2013-04-15 DIAGNOSIS — K219 Gastro-esophageal reflux disease without esophagitis: Secondary | ICD-10-CM | POA: Diagnosis present

## 2013-04-15 DIAGNOSIS — Z9884 Bariatric surgery status: Secondary | ICD-10-CM | POA: Diagnosis not present

## 2013-04-15 DIAGNOSIS — Z885 Allergy status to narcotic agent status: Secondary | ICD-10-CM | POA: Diagnosis not present

## 2013-04-15 DIAGNOSIS — R112 Nausea with vomiting, unspecified: Secondary | ICD-10-CM | POA: Insufficient documentation

## 2013-04-15 DIAGNOSIS — D649 Anemia, unspecified: Secondary | ICD-10-CM | POA: Diagnosis present

## 2013-04-15 DIAGNOSIS — Z888 Allergy status to other drugs, medicaments and biological substances status: Secondary | ICD-10-CM | POA: Diagnosis not present

## 2013-04-15 DIAGNOSIS — E119 Type 2 diabetes mellitus without complications: Secondary | ICD-10-CM | POA: Diagnosis present

## 2013-04-15 DIAGNOSIS — N189 Chronic kidney disease, unspecified: Secondary | ICD-10-CM | POA: Diagnosis present

## 2013-04-15 DIAGNOSIS — Z6838 Body mass index (BMI) 38.0-38.9, adult: Secondary | ICD-10-CM | POA: Diagnosis not present

## 2013-04-15 DIAGNOSIS — IMO0002 Reserved for concepts with insufficient information to code with codable children: Secondary | ICD-10-CM | POA: Diagnosis present

## 2013-04-15 DIAGNOSIS — K3189 Other diseases of stomach and duodenum: Secondary | ICD-10-CM | POA: Diagnosis present

## 2013-04-15 DIAGNOSIS — Z883 Allergy status to other anti-infective agents status: Secondary | ICD-10-CM | POA: Diagnosis not present

## 2013-04-15 DIAGNOSIS — E669 Obesity, unspecified: Secondary | ICD-10-CM | POA: Diagnosis present

## 2013-04-15 DIAGNOSIS — R109 Unspecified abdominal pain: Secondary | ICD-10-CM | POA: Diagnosis present

## 2013-04-15 DIAGNOSIS — Z86718 Personal history of other venous thrombosis and embolism: Secondary | ICD-10-CM | POA: Diagnosis not present

## 2013-04-15 DIAGNOSIS — F411 Generalized anxiety disorder: Secondary | ICD-10-CM | POA: Diagnosis present

## 2013-04-15 DIAGNOSIS — E739 Lactose intolerance, unspecified: Secondary | ICD-10-CM | POA: Diagnosis present

## 2013-04-15 DIAGNOSIS — F329 Major depressive disorder, single episode, unspecified: Secondary | ICD-10-CM | POA: Diagnosis present

## 2013-04-15 DIAGNOSIS — G4700 Insomnia, unspecified: Secondary | ICD-10-CM | POA: Diagnosis present

## 2013-04-15 DIAGNOSIS — I129 Hypertensive chronic kidney disease with stage 1 through stage 4 chronic kidney disease, or unspecified chronic kidney disease: Secondary | ICD-10-CM | POA: Diagnosis present

## 2013-04-15 DIAGNOSIS — Z7901 Long term (current) use of anticoagulants: Secondary | ICD-10-CM | POA: Diagnosis not present

## 2013-04-24 DIAGNOSIS — E119 Type 2 diabetes mellitus without complications: Secondary | ICD-10-CM | POA: Insufficient documentation

## 2013-04-24 DIAGNOSIS — R638 Other symptoms and signs concerning food and fluid intake: Secondary | ICD-10-CM | POA: Insufficient documentation

## 2013-04-28 ENCOUNTER — Ambulatory Visit: Payer: BC Managed Care – PPO | Admitting: *Deleted

## 2013-04-30 DIAGNOSIS — F19939 Other psychoactive substance use, unspecified with withdrawal, unspecified: Secondary | ICD-10-CM | POA: Insufficient documentation

## 2013-04-30 DIAGNOSIS — F19239 Other psychoactive substance dependence with withdrawal, unspecified: Secondary | ICD-10-CM | POA: Insufficient documentation

## 2013-05-20 ENCOUNTER — Other Ambulatory Visit: Payer: Self-pay | Admitting: Adult Health

## 2013-05-25 ENCOUNTER — Encounter: Payer: Self-pay | Admitting: Nurse Practitioner

## 2013-05-25 ENCOUNTER — Ambulatory Visit (INDEPENDENT_AMBULATORY_CARE_PROVIDER_SITE_OTHER): Payer: BC Managed Care – PPO | Admitting: Nurse Practitioner

## 2013-05-25 VITALS — BP 116/80 | Temp 100.3°F | Ht 64.0 in | Wt 215.0 lb

## 2013-05-25 DIAGNOSIS — J322 Chronic ethmoidal sinusitis: Secondary | ICD-10-CM

## 2013-05-25 MED ORDER — CEFPROZIL 250 MG/5ML PO SUSR: ORAL | Status: DC

## 2013-05-27 ENCOUNTER — Encounter: Payer: Self-pay | Admitting: Nurse Practitioner

## 2013-05-27 NOTE — Progress Notes (Signed)
Subjective:  Presents for c/o sinus pressure and ethmoid area headache x 4 d. Low grade fever. Green mucus. Minimal wheeze. Sore throat. No ear pain. 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   Objective:   BP 116/80  Temp(Src) 100.3 F (37.9 C)  Ht 5\' 4"  (1.626 m)  Wt 215 lb (97.523 kg)  BMI 36.89 kg/m2 NAD. Alert, oriented. TMs clear effusion, no erythema. Pharynx injected with thick green PND noted. Neck supple with mild soft nontender adenopathy. Lungs clear. Heart regular rate rhythm.   Assessment:Ethmoid sinusitis  Plan: Cefzil suspension as directed since patient has J-tube. OTC meds as directed. Call back next week if no improvement, sooner if worse.

## 2013-06-03 DIAGNOSIS — G894 Chronic pain syndrome: Secondary | ICD-10-CM | POA: Diagnosis not present

## 2013-06-15 DIAGNOSIS — I82409 Acute embolism and thrombosis of unspecified deep veins of unspecified lower extremity: Secondary | ICD-10-CM | POA: Diagnosis not present

## 2013-06-15 DIAGNOSIS — R002 Palpitations: Secondary | ICD-10-CM | POA: Diagnosis not present

## 2013-06-15 DIAGNOSIS — D649 Anemia, unspecified: Secondary | ICD-10-CM | POA: Diagnosis not present

## 2013-07-01 DIAGNOSIS — E569 Vitamin deficiency, unspecified: Secondary | ICD-10-CM | POA: Diagnosis not present

## 2013-07-01 DIAGNOSIS — Z0189 Encounter for other specified special examinations: Secondary | ICD-10-CM | POA: Diagnosis not present

## 2013-07-01 DIAGNOSIS — D649 Anemia, unspecified: Secondary | ICD-10-CM | POA: Diagnosis not present

## 2013-07-13 DIAGNOSIS — E538 Deficiency of other specified B group vitamins: Secondary | ICD-10-CM | POA: Insufficient documentation

## 2013-07-26 ENCOUNTER — Other Ambulatory Visit: Payer: Self-pay | Admitting: Nurse Practitioner

## 2013-07-26 MED ORDER — PANTOPRAZOLE SODIUM 40 MG PO TBEC: 40.0000 mg | DELAYED_RELEASE_TABLET | Freq: Every day | ORAL | Status: DC

## 2013-08-03 ENCOUNTER — Encounter: Payer: Self-pay | Admitting: Family Medicine

## 2013-08-03 ENCOUNTER — Ambulatory Visit (INDEPENDENT_AMBULATORY_CARE_PROVIDER_SITE_OTHER): Payer: BC Managed Care – PPO | Admitting: Family Medicine

## 2013-08-03 VITALS — BP 120/80 | Temp 98.2°F | Ht 64.0 in | Wt 195.5 lb

## 2013-08-03 DIAGNOSIS — J329 Chronic sinusitis, unspecified: Secondary | ICD-10-CM | POA: Diagnosis not present

## 2013-08-03 MED ORDER — AMOXICILLIN 500 MG PO CAPS: 500.0000 mg | ORAL_CAPSULE | Freq: Three times a day (TID) | ORAL | Status: DC

## 2013-08-03 MED ORDER — BENZONATATE 100 MG PO CAPS
100.0000 mg | ORAL_CAPSULE | Freq: Two times a day (BID) | ORAL | Status: DC | PRN
Start: 2013-08-03 — End: 2013-08-20

## 2013-08-03 NOTE — Progress Notes (Signed)
Subjective:    Patient ID: [name], female    DOB: [birth year], 33 y.o.   MRN: [id number]  Cough This is a new problem. The current episode started 1 to 4 weeks ago. The problem has been unchanged. The cough is productive of sputum. Associated symptoms include ear pain and a sore throat. Nothing aggravates the symptoms. She has tried OTC cough suppressant for the symptoms. The treatment provided no relief.    Started a week ago  Felt a lot of reflux  Then nausea   Gunky phlegm bilat ear pain and discomfort  No fever,  Review of Systems  HENT: Positive for ear pain and sore throat.   Respiratory: Positive for cough.    No vomiting or diarrhea ROS otherwise negative    Objective:   Physical Exam Alert no apparent distress slight malaise. HEENT moderate nasal congestion. Lungs intermittent bronchial cough no crackles no wheezes heart regular in rhythm.       Assessment & Plan:  Impression rhinosinusitis and bronchitis plan a mocks 500 3 times a day 10 days. Tessalon Perles when necessary for cough. Symptomatic care discussed. WSL

## 2013-08-04 ENCOUNTER — Encounter: Payer: Self-pay | Admitting: Family Medicine

## 2013-08-05 ENCOUNTER — Telehealth: Payer: Self-pay | Admitting: Family Medicine

## 2013-08-05 MED ORDER — AZITHROMYCIN 250 MG PO TABS: ORAL_TABLET | ORAL | Status: DC

## 2013-08-05 NOTE — Telephone Encounter (Signed)
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

## 2013-08-05 NOTE — Telephone Encounter (Signed)
Patient notified and verbalized understanding. 

## 2013-08-05 NOTE — Telephone Encounter (Signed)
Pt states that the antibiotic you put her on a few days ago is giving her diarrhea and wants to know If you can or do you need to switch it to something else?   [facility]   Seen and Issued Amoxicillin 500 mg tabs on 3/2

## 2013-08-11 DIAGNOSIS — L910 Hypertrophic scar: Secondary | ICD-10-CM | POA: Diagnosis not present

## 2013-08-19 DIAGNOSIS — K219 Gastro-esophageal reflux disease without esophagitis: Secondary | ICD-10-CM | POA: Diagnosis not present

## 2013-08-19 DIAGNOSIS — G8929 Other chronic pain: Secondary | ICD-10-CM | POA: Diagnosis not present

## 2013-08-19 DIAGNOSIS — R110 Nausea: Secondary | ICD-10-CM | POA: Diagnosis not present

## 2013-08-19 DIAGNOSIS — R109 Unspecified abdominal pain: Secondary | ICD-10-CM | POA: Diagnosis not present

## 2013-08-20 ENCOUNTER — Other Ambulatory Visit: Payer: Self-pay | Admitting: Family Medicine

## 2013-08-21 NOTE — Telephone Encounter (Signed)
RX and seen on 08/03/13 for cough

## 2013-08-26 DIAGNOSIS — J9801 Acute bronchospasm: Secondary | ICD-10-CM | POA: Diagnosis not present

## 2013-08-26 DIAGNOSIS — R109 Unspecified abdominal pain: Secondary | ICD-10-CM | POA: Diagnosis not present

## 2013-08-26 DIAGNOSIS — E282 Polycystic ovarian syndrome: Secondary | ICD-10-CM | POA: Diagnosis not present

## 2013-08-26 DIAGNOSIS — K219 Gastro-esophageal reflux disease without esophagitis: Secondary | ICD-10-CM | POA: Diagnosis not present

## 2013-08-26 DIAGNOSIS — Y832 Surgical operation with anastomosis, bypass or graft as the cause of abnormal reaction of the patient, or of later complication, without mention of misadventure at the time of the procedure: Secondary | ICD-10-CM | POA: Diagnosis not present

## 2013-08-26 DIAGNOSIS — D509 Iron deficiency anemia, unspecified: Secondary | ICD-10-CM | POA: Diagnosis not present

## 2013-08-27 ENCOUNTER — Ambulatory Visit (INDEPENDENT_AMBULATORY_CARE_PROVIDER_SITE_OTHER): Payer: BC Managed Care – PPO | Admitting: Nurse Practitioner

## 2013-08-27 ENCOUNTER — Encounter: Payer: Self-pay | Admitting: Nurse Practitioner

## 2013-08-27 VITALS — BP 120/90 | Temp 98.1°F | Ht 64.0 in | Wt 203.2 lb

## 2013-08-27 DIAGNOSIS — J069 Acute upper respiratory infection, unspecified: Secondary | ICD-10-CM | POA: Diagnosis not present

## 2013-08-27 DIAGNOSIS — J45909 Unspecified asthma, uncomplicated: Secondary | ICD-10-CM

## 2013-08-27 DIAGNOSIS — J209 Acute bronchitis, unspecified: Secondary | ICD-10-CM | POA: Diagnosis not present

## 2013-08-27 MED ORDER — ALBUTEROL SULFATE (2.5 MG/3ML) 0.083% IN NEBU: INHALATION_SOLUTION | RESPIRATORY_TRACT | Status: DC

## 2013-08-27 MED ORDER — BENZONATATE 100 MG PO CAPS: ORAL_CAPSULE | ORAL | Status: DC

## 2013-08-27 MED ORDER — AZITHROMYCIN 250 MG PO TABS: ORAL_TABLET | ORAL | Status: DC

## 2013-08-27 NOTE — Progress Notes (Signed)
Subjective:  Presents for complaints of sinus congestion cough and slight wheezing and shortness of breath for the past 4 days. No fever. Cough mainly in the morning and after meals. Has a complicated history of reflux problems, continues followup with her GI specialist at [facility]. Is currently on Protonix and Carafate. Using albuterol inhaler with slight improvement. No sore throat or ear pain.  Objective:   BP 120/90  Temp(Src) 98.1 F (36.7 C) (Oral)  Ht 5\' 4"  (1.626 m)  Wt 203 lb 4 oz (92.194 kg)  BMI 34.87 kg/m2 NAD. Alert, oriented. TMs clear effusion, no erythema. Pharynx injected with yellow PND noted. Neck supple with mild soft anterior adenopathy. Lungs clear. Heart regular rate rhythm.  Assessment:Acute upper respiratory infections of unspecified site  Acute bronchitis  Mild reactive airways disease  Plan: Meds ordered this encounter  Medications  . pantoprazole (PROTONIX) 40 MG tablet    Sig: Take 40 mg by mouth 2 (two) times daily. Prn acid reflux  . sucralfate (CARAFATE) 1 GM/10ML suspension    Sig: Take 1 g by mouth 4 (four) times daily -  with meals and at bedtime.  . metFORMIN (GLUCOPHAGE) 1000 MG tablet    Sig: Take 1,000 mg by mouth 2 (two) times daily with a meal.  . azithromycin (ZITHROMAX Z-PAK) 250 MG tablet    Sig: Take 2 tablets (500 mg) on  Day 1,  followed by 1 tablet (250 mg) once daily on Days 2 through 5.    Dispense:  6 each    Refill:  0    Order Specific Question:  Supervising Provider    Answer:  Mikey Kirschner [[id number]]  . benzonatate (TESSALON) 100 MG capsule    Sig: TAKE ONE - TWO CAPSULES BY MOUTH THREE TIMES A DAY AS NEEDED FOR COUGH    Dispense:  30 capsule    Refill:  2    Order Specific Question:  Supervising Provider    Answer:  Mikey Kirschner [[id number]]  . DISCONTD: albuterol (PROVENTIL) (2.5 MG/3ML) 0.083% nebulizer solution    Sig: Use via neb q 4 hours prn wheezing    Dispense:  25 vial    Refill:  2    Order Specific Question:   Supervising Provider    Answer:  Mikey Kirschner [[id number]]  . albuterol (PROVENTIL) (2.5 MG/3ML) 0.083% nebulizer solution    Sig: Use via neb q 4 hours prn wheezing    Dispense:  25 vial    Refill:  2    Order Specific Question:  Supervising Provider    Answer:  Maggie Font   Given prescription for a nebulizer machine for home. Hold on oral prednisone due to severe reflux. Call back next week if no improvement, sooner if worse. OTC meds as directed for congestion, recommend Mucinex DM.

## 2013-08-31 ENCOUNTER — Encounter: Payer: Self-pay | Admitting: Nurse Practitioner

## 2013-10-07 ENCOUNTER — Encounter: Payer: Self-pay | Admitting: Family Medicine

## 2013-10-07 ENCOUNTER — Ambulatory Visit (INDEPENDENT_AMBULATORY_CARE_PROVIDER_SITE_OTHER): Payer: BC Managed Care – PPO | Admitting: Family Medicine

## 2013-10-07 VITALS — BP 132/88 | Temp 98.7°F | Ht 64.0 in | Wt 200.8 lb

## 2013-10-07 DIAGNOSIS — J069 Acute upper respiratory infection, unspecified: Secondary | ICD-10-CM | POA: Diagnosis not present

## 2013-10-07 DIAGNOSIS — J019 Acute sinusitis, unspecified: Secondary | ICD-10-CM | POA: Diagnosis not present

## 2013-10-07 MED ORDER — AZITHROMYCIN 250 MG PO TABS: ORAL_TABLET | ORAL | Status: DC

## 2013-10-07 MED ORDER — BENZONATATE 100 MG PO CAPS: ORAL_CAPSULE | ORAL | Status: DC

## 2013-10-07 NOTE — Progress Notes (Signed)
Subjective:    Patient ID: Franne Grip, female    DOB: 1980-11-12, 33 y.o.   MRN: [id number]  Cough This is a new problem. The current episode started in the past 7 days. Associated symptoms include rhinorrhea. Pertinent negatives include no chest pain, ear pain, fever, shortness of breath or wheezing. Associated symptoms comments: congestion. Treatments tried: mucinex. The treatment provided no relief.      Review of Systems  Constitutional: Negative for fever and activity change.  HENT: Positive for congestion and rhinorrhea. Negative for ear pain.   Eyes: Negative for discharge.  Respiratory: Positive for cough. Negative for shortness of breath and wheezing.   Cardiovascular: Negative for chest pain.       Objective:   Physical Exam  Nursing note and vitals reviewed. Constitutional: She appears well-developed.  HENT:  Head: Normocephalic.  Nose: Nose normal.  Mouth/Throat: Oropharynx is clear and moist. No oropharyngeal exudate.  Neck: Neck supple.  Cardiovascular: Normal rate and normal heart sounds.   No murmur heard. Pulmonary/Chest: Effort normal and breath sounds normal. She has no wheezes.  Lymphadenopathy:    She has no cervical adenopathy.  Skin: Skin is warm and dry.          Assessment & Plan:  Upper respiratory illness with secondary sinusitis antibiotics prescribed warning signs discussed not rest for distress

## 2013-10-12 ENCOUNTER — Telehealth: Payer: Self-pay | Admitting: Family Medicine

## 2013-10-12 ENCOUNTER — Other Ambulatory Visit: Payer: Self-pay | Admitting: Nurse Practitioner

## 2013-10-12 MED ORDER — SUMATRIPTAN SUCCINATE 100 MG PO TABS: ORAL_TABLET | ORAL | Status: DC

## 2013-10-12 NOTE — Telephone Encounter (Signed)
Rx sent 

## 2013-10-12 NOTE — Telephone Encounter (Signed)
Patient was notified.

## 2013-10-12 NOTE — Telephone Encounter (Signed)
Patient needs Rx for imitrex to [facility].

## 2013-11-02 ENCOUNTER — Encounter: Payer: Self-pay | Admitting: Nurse Practitioner

## 2013-11-02 ENCOUNTER — Ambulatory Visit (INDEPENDENT_AMBULATORY_CARE_PROVIDER_SITE_OTHER): Payer: BC Managed Care – PPO | Admitting: Nurse Practitioner

## 2013-11-02 VITALS — BP 112/80 | Temp 98.5°F | Ht 64.0 in | Wt 202.0 lb

## 2013-11-02 DIAGNOSIS — J329 Chronic sinusitis, unspecified: Secondary | ICD-10-CM | POA: Diagnosis not present

## 2013-11-02 DIAGNOSIS — J209 Acute bronchitis, unspecified: Secondary | ICD-10-CM

## 2013-11-02 DIAGNOSIS — K219 Gastro-esophageal reflux disease without esophagitis: Secondary | ICD-10-CM | POA: Diagnosis not present

## 2013-11-02 MED ORDER — AZITHROMYCIN 250 MG PO TABS: ORAL_TABLET | ORAL | Status: DC

## 2013-11-02 MED ORDER — RIZATRIPTAN BENZOATE 10 MG PO TBDP: 10.0000 mg | ORAL_TABLET | ORAL | Status: DC | PRN

## 2013-11-02 MED ORDER — METHYLPREDNISOLONE ACETATE 40 MG/ML IJ SUSP
40.0000 mg | Freq: Once | INTRAMUSCULAR | Status: AC
  Administered 2013-11-02: 40 mg via INTRAMUSCULAR

## 2013-11-02 NOTE — Progress Notes (Signed)
Subjective:  Presents for c/o cough that began a week ago after waking up with severe acid reflux and regurgitation. Frequent cough esp in AM and with laughing, activity. Now producing yellow sputum. Frontal area headache and pain behind the eyes, describes as sharp, constant. History of migraines. On Imitrex. Would like to try Maxalt. Had some wheezing initially, used albuterol inhaler x 2, none since. No fever. Some vomiting. Slight diarrhea. Continued epigastric pain. Is scheduled for endoscopy in 3 days.   Objective:   BP 112/80  Temp(Src) 98.5 F (36.9 C) (Oral)  Ht 5\' 4"  (1.626 m)  Wt 202 lb (91.627 kg)  BMI 34.66 kg/m2 NAD. Alert, oriented. TMs slight clear effusion. Pharynx erythema with PND noted. Neck supple with mild anterior adenopathy. Lungs scattered coarse exp crackles; no wheezing or tachypnea. occas congested slightly croupy cough noted. No stridor. Heart RRR.  Assessment: Rhinosinusitis - Plan: methylPREDNISolone acetate (DEPO-MEDROL) injection 40 mg  Acute bronchitis  GERD (gastroesophageal reflux disease)  Plan:  Meds ordered this encounter  Medications  . rizatriptan (MAXALT-MLT) 10 MG disintegrating tablet    Sig: Take 1 tablet (10 mg total) by mouth as needed for migraine. May repeat in 2 hours if needed    Dispense:  10 tablet    Refill:  0    Order Specific Question:  Supervising Provider    Answer:  Mikey Kirschner [[id number]]  . azithromycin (ZITHROMAX Z-PAK) 250 MG tablet    Sig: Take 2 tablets (500 mg) on  Day 1,  followed by 1 tablet (250 mg) once daily on Days 2 through 5.    Dispense:  6 each    Refill:  0    Order Specific Question:  Supervising Provider    Answer:  Mikey Kirschner [[id number]]  . methylPREDNISolone acetate (DEPO-MEDROL) injection 40 mg    Sig:   reflux most likely contributing to cough and slight croup. Continue GI meds as directed. Follow up with specialist in 3 days as planned. Call back if other symptoms worsen or persist.

## 2013-11-05 DIAGNOSIS — Z09 Encounter for follow-up examination after completed treatment for conditions other than malignant neoplasm: Secondary | ICD-10-CM | POA: Diagnosis not present

## 2013-11-05 DIAGNOSIS — K319 Disease of stomach and duodenum, unspecified: Secondary | ICD-10-CM | POA: Diagnosis not present

## 2013-11-05 DIAGNOSIS — F411 Generalized anxiety disorder: Secondary | ICD-10-CM | POA: Diagnosis not present

## 2013-11-05 DIAGNOSIS — K219 Gastro-esophageal reflux disease without esophagitis: Secondary | ICD-10-CM | POA: Diagnosis not present

## 2013-11-05 DIAGNOSIS — E119 Type 2 diabetes mellitus without complications: Secondary | ICD-10-CM | POA: Diagnosis not present

## 2013-11-05 DIAGNOSIS — R109 Unspecified abdominal pain: Secondary | ICD-10-CM | POA: Diagnosis not present

## 2013-11-05 DIAGNOSIS — I10 Essential (primary) hypertension: Secondary | ICD-10-CM | POA: Diagnosis not present

## 2013-11-05 DIAGNOSIS — K2289 Other specified disease of esophagus: Secondary | ICD-10-CM | POA: Diagnosis not present

## 2013-11-05 DIAGNOSIS — K228 Other specified diseases of esophagus: Secondary | ICD-10-CM | POA: Diagnosis not present

## 2013-11-17 ENCOUNTER — Ambulatory Visit (INDEPENDENT_AMBULATORY_CARE_PROVIDER_SITE_OTHER): Payer: BC Managed Care – PPO | Admitting: Family Medicine

## 2013-11-17 ENCOUNTER — Encounter: Payer: Self-pay | Admitting: Family Medicine

## 2013-11-17 VITALS — BP 120/80 | Ht 64.0 in | Wt 201.0 lb

## 2013-11-17 DIAGNOSIS — M25569 Pain in unspecified knee: Secondary | ICD-10-CM | POA: Diagnosis not present

## 2013-11-17 MED ORDER — PREDNISONE 10 MG PO TABS: ORAL_TABLET | ORAL | Status: DC

## 2013-11-17 NOTE — Progress Notes (Signed)
Subjective:    Patient ID: [name], female    DOB: 07-04-1980, 33 y.o.   MRN: [id number]  Knee Pain  Incident onset: Sunday  Incident location: at church. The injury mechanism is unknown. The pain is present in the left knee. The quality of the pain is described as aching. The pain has been constant since onset. Associated symptoms include muscle weakness. She reports no foreign bodies present. The symptoms are aggravated by movement and weight bearing (a shooting, sharp pain when walking). She has tried heat and acetaminophen for the symptoms. The treatment provided mild relief.    Sig pain  Back side hurts worse  Used heating pad  nsaids contrindic  Using tylenol  Pt just had 33rd endoscopy, found bile and inflammed areas  Due to get pH test  Having some trouble swallowing    Review of Systems No chest pain no joint pain elsewhere no back pain no hip pain  ROS otherwise negative    Objective:   Physical Exam Alert no apparent distress. Lungs clear. Heart regular in rhythm. H&T normal. Left knee positive posterior and tick cubital space tenderness to deep palpation. No obvious effusion good range of motion no joint laxity       Assessment & Plan:   impression knee strain hyperextension injury discussed plan prednisone taper local measures discussed all off on imaging rationale discussed

## 2013-11-25 DIAGNOSIS — R1013 Epigastric pain: Secondary | ICD-10-CM | POA: Diagnosis not present

## 2013-11-25 DIAGNOSIS — K219 Gastro-esophageal reflux disease without esophagitis: Secondary | ICD-10-CM | POA: Diagnosis not present

## 2013-11-25 DIAGNOSIS — I82409 Acute embolism and thrombosis of unspecified deep veins of unspecified lower extremity: Secondary | ICD-10-CM | POA: Diagnosis not present

## 2013-11-25 DIAGNOSIS — D509 Iron deficiency anemia, unspecified: Secondary | ICD-10-CM | POA: Diagnosis not present

## 2013-12-02 DIAGNOSIS — Z9884 Bariatric surgery status: Secondary | ICD-10-CM | POA: Diagnosis not present

## 2013-12-02 DIAGNOSIS — Z01419 Encounter for gynecological examination (general) (routine) without abnormal findings: Secondary | ICD-10-CM | POA: Diagnosis not present

## 2013-12-02 DIAGNOSIS — N920 Excessive and frequent menstruation with regular cycle: Secondary | ICD-10-CM | POA: Diagnosis not present

## 2013-12-02 DIAGNOSIS — Z30433 Encounter for removal and reinsertion of intrauterine contraceptive device: Secondary | ICD-10-CM | POA: Diagnosis not present

## 2013-12-02 DIAGNOSIS — Z30430 Encounter for insertion of intrauterine contraceptive device: Secondary | ICD-10-CM | POA: Diagnosis not present

## 2013-12-02 DIAGNOSIS — G4733 Obstructive sleep apnea (adult) (pediatric): Secondary | ICD-10-CM | POA: Diagnosis not present

## 2013-12-02 DIAGNOSIS — K219 Gastro-esophageal reflux disease without esophagitis: Secondary | ICD-10-CM | POA: Diagnosis not present

## 2013-12-08 ENCOUNTER — Ambulatory Visit (INDEPENDENT_AMBULATORY_CARE_PROVIDER_SITE_OTHER): Payer: BC Managed Care – PPO | Admitting: Family Medicine

## 2013-12-08 ENCOUNTER — Encounter: Payer: Self-pay | Admitting: Family Medicine

## 2013-12-08 VITALS — BP 118/86 | Temp 98.6°F | Ht 64.0 in | Wt 196.0 lb

## 2013-12-08 DIAGNOSIS — N3000 Acute cystitis without hematuria: Secondary | ICD-10-CM

## 2013-12-08 DIAGNOSIS — R300 Dysuria: Secondary | ICD-10-CM

## 2013-12-08 LAB — POCT URINALYSIS DIPSTICK
PROTEIN UA: 30
Spec Grav, UA: >=1.03
pH, UA: 5

## 2013-12-08 MED ORDER — PHENAZOPYRIDINE HCL 200 MG PO TABS: 200.0000 mg | ORAL_TABLET | Freq: Three times a day (TID) | ORAL | Status: DC | PRN

## 2013-12-08 MED ORDER — CIPROFLOXACIN HCL 250 MG PO TABS: 250.0000 mg | ORAL_TABLET | Freq: Two times a day (BID) | ORAL | Status: DC

## 2013-12-08 NOTE — Progress Notes (Signed)
Subjective:    Patient ID: [name], female    DOB: 09-18-1980, 33 y.o.   MRN: [id number]  Dysuria  This is a new problem. The current episode started in the past 7 days. There has been no fever. Associated symptoms include frequency. Associated symptoms comments: Pelvic pain.   Two painful knots on left leg. Noticied them about 2 weeks ago.  Increased pressure and urgency patient recalls no injury. Now on a blood thinner.  No fever or bk pain or vom   awhile since lastno recent cath,    Review of Systems  Genitourinary: Positive for dysuria and frequency.   no vomiting no diarrhea no fever ROS otherwise negative     Objective:   Physical Exam  Alert mild malaise. Vital stable lungs clear. Heart regular in rhythm. Lateral side 2 small hematomas with central nodule no CVA tenderness abdomen benign  8-10 white blood cells per high-power field      Assessment & Plan:  Impression 1 urinary tract infection #2 resolving spontaneous hematomas likely with trivial injury within normal limits for meds and discuss plan Cipro 250 twice a day 5 days plus above WSL

## 2013-12-15 ENCOUNTER — Telehealth: Payer: Self-pay | Admitting: Family Medicine

## 2013-12-15 ENCOUNTER — Other Ambulatory Visit: Payer: Self-pay | Admitting: *Deleted

## 2013-12-15 MED ORDER — SULFAMETHOXAZOLE-TMP DS 800-160 MG PO TABS: 1.0000 | ORAL_TABLET | Freq: Two times a day (BID) | ORAL | Status: DC

## 2013-12-15 MED ORDER — PHENAZOPYRIDINE HCL 200 MG PO TABS: 200.0000 mg | ORAL_TABLET | Freq: Three times a day (TID) | ORAL | Status: DC | PRN

## 2013-12-15 NOTE — Telephone Encounter (Signed)
Discussed with patient. meds sent to pharm.

## 2013-12-15 NOTE — Telephone Encounter (Signed)
Frequent urination, not a full stream, pelvic pressure, no fever, no abd or back pain. Finished cipro. Pt got better but  Started having symptoms again 2 days ago. Seen  DR. Tora Kindred on 07/07. Dx UTI prescribed cipro 250 BID for 5 days and pyridium 200 one tid #10. Would also like a refill on pyridium. No urine culture was done.

## 2013-12-15 NOTE — Telephone Encounter (Signed)
It is possible that the previous antibiotic did not totally eliminate the infection. I would recommend Bactrim DS 1 twice a day, #10, refill Pyridium, if ongoing troubles needs office visit for urinalysis and urine culture

## 2013-12-15 NOTE — Telephone Encounter (Signed)
Patient is still having pressure and frequency of urination and not getting a full stream of urine. She was seen last week for dysuria. Also wants to know if she can get another refill of her phenazopyridine (PYRIDIUM) 200 MG tablet and if its safe to take for more than a week?  [facility]

## 2014-01-20 DIAGNOSIS — L719 Rosacea, unspecified: Secondary | ICD-10-CM | POA: Diagnosis not present

## 2014-01-20 DIAGNOSIS — L910 Hypertrophic scar: Secondary | ICD-10-CM | POA: Diagnosis not present

## 2014-01-25 ENCOUNTER — Ambulatory Visit (INDEPENDENT_AMBULATORY_CARE_PROVIDER_SITE_OTHER): Payer: BC Managed Care – PPO | Admitting: Nurse Practitioner

## 2014-01-25 VITALS — BP 132/86 | Temp 98.6°F | Ht 64.0 in | Wt 196.0 lb

## 2014-01-25 DIAGNOSIS — J0120 Acute ethmoidal sinusitis, unspecified: Secondary | ICD-10-CM

## 2014-01-25 DIAGNOSIS — J209 Acute bronchitis, unspecified: Secondary | ICD-10-CM | POA: Diagnosis not present

## 2014-01-25 DIAGNOSIS — R062 Wheezing: Secondary | ICD-10-CM | POA: Diagnosis not present

## 2014-01-25 MED ORDER — BENZONATATE 100 MG PO CAPS: 100.0000 mg | ORAL_CAPSULE | Freq: Three times a day (TID) | ORAL | Status: DC | PRN

## 2014-01-25 MED ORDER — PREDNISONE 20 MG PO TABS: ORAL_TABLET | ORAL | Status: DC

## 2014-01-25 MED ORDER — AZITHROMYCIN 250 MG PO TABS: ORAL_TABLET | ORAL | Status: DC

## 2014-01-25 MED ORDER — ALBUTEROL SULFATE HFA 108 (90 BASE) MCG/ACT IN AERS: 2.0000 | INHALATION_SPRAY | RESPIRATORY_TRACT | Status: DC | PRN

## 2014-01-29 ENCOUNTER — Encounter: Payer: Self-pay | Admitting: Nurse Practitioner

## 2014-01-29 NOTE — Progress Notes (Signed)
Subjective:  Presents with complaints of bronchitis that began 4 days ago. Frequent cough producing yellow sputum. Using her inhaler every 6 hours and her nebulizer 3-4 times per day. Ear pain. Ethmoid sinus area headache. Facial area headache radiating into the teeth. No sore throat. No fever.   Objective:   BP 132/86  Temp(Src) 98.6 F (37 C)  Ht 5\' 4"  (1.626 m)  Wt 196 lb (88.905 kg)  BMI 33.63 kg/m2 NAD. Alert, oriented. TMs significant clear effusion, no erythema. Pharynx injected with PND noted. Neck supple with mild soft anterior adenopathy. Lungs scattered faint expiratory crackles, rare faint expiratory wheeze noted posterior only. Good airflow. No tachypnea. Normal color. Heart regular rate rhythm.  Assessment: Acute ethmoidal sinusitis, recurrence not specified  Acute bronchitis, unspecified organism  Wheezing  Plan:  Meds ordered this encounter  Medications  . azithromycin (ZITHROMAX Z-PAK) 250 MG tablet    Sig: Take 2 tablets (500 mg) on  Day 1,  followed by 1 tablet (250 mg) once daily on Days 2 through 5.    Dispense:  6 each    Refill:  0    Order Specific Question:  Supervising Provider    Answer:  Mikey Kirschner [[id number]]  . predniSONE (DELTASONE) 20 MG tablet    Sig: 2 po qd x 5 d    Dispense:  10 tablet    Refill:  0    Order Specific Question:  Supervising Provider    Answer:  Mikey Kirschner [[id number]]  . benzonatate (TESSALON) 100 MG capsule    Sig: Take 1 capsule (100 mg total) by mouth 3 (three) times daily as needed for cough.    Dispense:  30 capsule    Refill:  0    Order Specific Question:  Supervising Provider    Answer:  Mikey Kirschner [[id number]]  . albuterol (PROVENTIL HFA;VENTOLIN HFA) 108 (90 BASE) MCG/ACT inhaler    Sig: Inhale 2 puffs into the lungs every 4 (four) hours as needed for wheezing or shortness of breath.    Dispense:  1 Inhaler    Refill:  2    Order Specific Question:  Supervising Provider    Answer:  Mikey Kirschner [[id number]]   Call back in 72 hours if no improvement, call or go to ED sooner if worse.

## 2014-02-09 ENCOUNTER — Other Ambulatory Visit: Payer: Self-pay | Admitting: Nurse Practitioner

## 2014-02-09 MED ORDER — CLONAZEPAM 0.5 MG PO TABS: 0.5000 mg | ORAL_TABLET | Freq: Two times a day (BID) | ORAL | Status: DC | PRN

## 2014-02-09 NOTE — Progress Notes (Signed)
Phone call from patient; at [facility] with her friend who is dying from cancer. Requesting med for panic attacks. Regular follow up with psychiatrist. Will send in one time order for Klonopin which she has taken fine in the past. Has IUD for birth control.

## 2014-02-12 ENCOUNTER — Other Ambulatory Visit: Payer: Self-pay | Admitting: *Deleted

## 2014-02-12 DIAGNOSIS — Z23 Encounter for immunization: Secondary | ICD-10-CM | POA: Diagnosis not present

## 2014-02-16 ENCOUNTER — Other Ambulatory Visit: Payer: Self-pay | Admitting: Nurse Practitioner

## 2014-03-02 ENCOUNTER — Ambulatory Visit (INDEPENDENT_AMBULATORY_CARE_PROVIDER_SITE_OTHER): Payer: BC Managed Care – PPO | Admitting: Family Medicine

## 2014-03-02 VITALS — Temp 98.5°F | Ht 64.0 in | Wt 195.0 lb

## 2014-03-02 DIAGNOSIS — J019 Acute sinusitis, unspecified: Secondary | ICD-10-CM | POA: Diagnosis not present

## 2014-03-02 MED ORDER — AMOXICILLIN-POT CLAVULANATE 875-125 MG PO TABS: 1.0000 | ORAL_TABLET | Freq: Two times a day (BID) | ORAL | Status: DC

## 2014-03-02 NOTE — Progress Notes (Signed)
Subjective:    Patient ID: [name], female    DOB: December 12, 1980, 33 y.o.   MRN: [id number]  Cough This is a new problem. The current episode started 1 to 4 weeks ago. Associated symptoms include headaches, nasal congestion, rhinorrhea and wheezing. Pertinent negatives include no chest pain, ear pain, fever or shortness of breath. Treatments tried: mucinex.    She feels that this triggered by her reflux.  Review of Systems  Constitutional: Negative for fever and activity change.  HENT: Positive for congestion and rhinorrhea. Negative for ear pain.   Eyes: Negative for discharge.  Respiratory: Positive for cough and wheezing. Negative for shortness of breath.   Cardiovascular: Negative for chest pain.  Neurological: Positive for headaches.       Objective:   Physical Exam  Nursing note and vitals reviewed. Constitutional: She appears well-developed.  HENT:  Head: Normocephalic.  Nose: Nose normal.  Mouth/Throat: Oropharynx is clear and moist. No oropharyngeal exudate.  Neck: Neck supple.  Cardiovascular: Normal rate and normal heart sounds.   No murmur heard. Pulmonary/Chest: Effort normal and breath sounds normal. She has no wheezes.  Lymphadenopathy:    She has no cervical adenopathy.  Skin: Skin is warm and dry.          Assessment & Plan:  Sinusitis antibiotics prescribed warning signs discussed followup ongoing trouble  Following up with specialist for reflux continue current measures

## 2014-03-03 DIAGNOSIS — K2100 Gastro-esophageal reflux disease with esophagitis, without bleeding: Secondary | ICD-10-CM | POA: Diagnosis not present

## 2014-03-03 DIAGNOSIS — R197 Diarrhea, unspecified: Secondary | ICD-10-CM | POA: Diagnosis not present

## 2014-03-03 DIAGNOSIS — R112 Nausea with vomiting, unspecified: Secondary | ICD-10-CM | POA: Diagnosis not present

## 2014-03-03 DIAGNOSIS — R1011 Right upper quadrant pain: Secondary | ICD-10-CM | POA: Diagnosis not present

## 2014-03-08 ENCOUNTER — Other Ambulatory Visit: Payer: Self-pay | Admitting: Nurse Practitioner

## 2014-04-05 ENCOUNTER — Encounter: Payer: Self-pay | Admitting: Nurse Practitioner

## 2014-04-05 ENCOUNTER — Ambulatory Visit (INDEPENDENT_AMBULATORY_CARE_PROVIDER_SITE_OTHER): Payer: BC Managed Care – PPO | Admitting: Nurse Practitioner

## 2014-04-05 VITALS — BP 122/80 | Temp 98.9°F | Ht 64.0 in | Wt 197.8 lb

## 2014-04-05 DIAGNOSIS — L700 Acne vulgaris: Secondary | ICD-10-CM

## 2014-04-05 DIAGNOSIS — J0120 Acute ethmoidal sinusitis, unspecified: Secondary | ICD-10-CM | POA: Diagnosis not present

## 2014-04-05 MED ORDER — AZITHROMYCIN 250 MG PO TABS: ORAL_TABLET | ORAL | Status: DC

## 2014-04-05 MED ORDER — MINOCYCLINE HCL 50 MG PO CAPS: 50.0000 mg | ORAL_CAPSULE | Freq: Two times a day (BID) | ORAL | Status: DC

## 2014-04-05 NOTE — Patient Instructions (Signed)
OTC antihistamine Nasacort AQ as directed 

## 2014-04-07 ENCOUNTER — Encounter: Payer: Self-pay | Admitting: Nurse Practitioner

## 2014-04-07 NOTE — Progress Notes (Signed)
Subjective:  Presents complaints of sore throat and cough that began a week ago. Started after an episode of acid reflux which is a typical trigger for her symptoms. Has a complex GI history see chart. No fever. Ethmoid sinus area headache. Frequent cough producing yellow mucus. Slight wheeze, using her albuterol every 6 hours which is helping. No ear pain. No vomiting diarrhea or abdominal pain. Has Mirena for birth control. For the past 6 months has noticed a flareup of her acne as well as excessive hair growth on her face. Has a history of PCOS.  Objective:   BP 122/80 mmHg  Temp(Src) 98.9 F (37.2 C) (Oral)  Ht 5\' 4"  (1.626 m)  Wt 197 lb 12.8 oz (89.721 kg)  BMI 33.94 kg/m2 NAD. Alert, oriented. TMs clear effusion, no erythema. Pharynx mildly erythematous with PND noted. Neck supple with mild soft anterior adenopathy. Lungs clear. Heart regular rate rhythm. Fine papular acne covering a good portion of the face. Stubble noted where patient has shaved facial hair.  Assessment: Acute ethmoidal sinusitis, recurrence not specified  Acne vulgaris  Plan:  Meds ordered this encounter  Medications  . azithromycin (ZITHROMAX Z-PAK) 250 MG tablet    Sig: Take 2 tablets (500 mg) on  Day 1,  followed by 1 tablet (250 mg) once daily on Days 2 through 5.    Dispense:  6 each    Refill:  0    Order Specific Question:  Supervising Provider    Answer:  Mikey Kirschner [[id number]]  . minocycline (MINOCIN,DYNACIN) 50 MG capsule    Sig: Take 1 capsule (50 mg total) by mouth 2 (two) times daily.    Dispense:  60 capsule    Refill:  2    Order Specific Question:  Supervising Provider    Answer:  Mikey Kirschner [[id number]]   OTC antihistamines as directed. Nasacort AQ as directed. Recommend follow-up with her gynecologist regarding hormone issues. Call back if symptoms worsen or persist.

## 2014-04-21 DIAGNOSIS — H04123 Dry eye syndrome of bilateral lacrimal glands: Secondary | ICD-10-CM | POA: Diagnosis not present

## 2014-05-16 ENCOUNTER — Other Ambulatory Visit: Payer: Self-pay | Admitting: Nurse Practitioner

## 2014-05-25 DIAGNOSIS — K911 Postgastric surgery syndromes: Secondary | ICD-10-CM | POA: Diagnosis not present

## 2014-05-25 DIAGNOSIS — I82409 Acute embolism and thrombosis of unspecified deep veins of unspecified lower extremity: Secondary | ICD-10-CM | POA: Diagnosis not present

## 2014-05-25 DIAGNOSIS — K9589 Other complications of other bariatric procedure: Secondary | ICD-10-CM | POA: Diagnosis not present

## 2014-05-25 DIAGNOSIS — E538 Deficiency of other specified B group vitamins: Secondary | ICD-10-CM | POA: Diagnosis not present

## 2014-05-25 DIAGNOSIS — D649 Anemia, unspecified: Secondary | ICD-10-CM | POA: Diagnosis not present

## 2014-06-09 DIAGNOSIS — I82403 Acute embolism and thrombosis of unspecified deep veins of lower extremity, bilateral: Secondary | ICD-10-CM | POA: Diagnosis not present

## 2014-06-09 DIAGNOSIS — Z86718 Personal history of other venous thrombosis and embolism: Secondary | ICD-10-CM | POA: Diagnosis not present

## 2014-06-09 DIAGNOSIS — Z7901 Long term (current) use of anticoagulants: Secondary | ICD-10-CM | POA: Diagnosis not present

## 2014-06-09 DIAGNOSIS — K210 Gastro-esophageal reflux disease with esophagitis: Secondary | ICD-10-CM | POA: Diagnosis not present

## 2014-06-09 DIAGNOSIS — F329 Major depressive disorder, single episode, unspecified: Secondary | ICD-10-CM | POA: Diagnosis not present

## 2014-06-09 DIAGNOSIS — Z8701 Personal history of pneumonia (recurrent): Secondary | ICD-10-CM | POA: Diagnosis not present

## 2014-06-09 DIAGNOSIS — K219 Gastro-esophageal reflux disease without esophagitis: Secondary | ICD-10-CM | POA: Diagnosis not present

## 2014-06-09 DIAGNOSIS — Z9884 Bariatric surgery status: Secondary | ICD-10-CM | POA: Diagnosis not present

## 2014-06-09 DIAGNOSIS — Z01818 Encounter for other preprocedural examination: Secondary | ICD-10-CM | POA: Diagnosis not present

## 2014-06-14 DIAGNOSIS — M47816 Spondylosis without myelopathy or radiculopathy, lumbar region: Secondary | ICD-10-CM | POA: Diagnosis not present

## 2014-06-14 DIAGNOSIS — M9902 Segmental and somatic dysfunction of thoracic region: Secondary | ICD-10-CM | POA: Diagnosis not present

## 2014-06-14 DIAGNOSIS — M47812 Spondylosis without myelopathy or radiculopathy, cervical region: Secondary | ICD-10-CM | POA: Diagnosis not present

## 2014-06-14 DIAGNOSIS — M9903 Segmental and somatic dysfunction of lumbar region: Secondary | ICD-10-CM | POA: Diagnosis not present

## 2014-06-14 DIAGNOSIS — M546 Pain in thoracic spine: Secondary | ICD-10-CM | POA: Diagnosis not present

## 2014-06-14 DIAGNOSIS — M9901 Segmental and somatic dysfunction of cervical region: Secondary | ICD-10-CM | POA: Diagnosis not present

## 2014-06-15 ENCOUNTER — Telehealth: Payer: Self-pay | Admitting: Family Medicine

## 2014-06-15 DIAGNOSIS — F322 Major depressive disorder, single episode, severe without psychotic features: Secondary | ICD-10-CM | POA: Diagnosis not present

## 2014-06-15 DIAGNOSIS — F4310 Post-traumatic stress disorder, unspecified: Secondary | ICD-10-CM | POA: Diagnosis not present

## 2014-06-15 MED ORDER — SUMATRIPTAN SUCCINATE 100 MG PO TABS
ORAL_TABLET | ORAL | Status: DC
Start: 2014-06-15 — End: 2016-03-13

## 2014-06-15 NOTE — Telephone Encounter (Signed)
SUMAtriptan (IMITREX) 100 MG tablet    Pt has had a name an insurance change, needs new script on this sent to  [facility]  Questions please call the patient  She was [name]

## 2014-06-15 NOTE — Telephone Encounter (Signed)
Rx sent electronically to pharmacy. Patient notified. 

## 2014-06-19 ENCOUNTER — Other Ambulatory Visit: Payer: Self-pay | Admitting: Nurse Practitioner

## 2014-06-19 MED ORDER — TRIAMCINOLONE ACETONIDE 0.1 % MT PSTE: 1.0000 "application " | PASTE | Freq: Three times a day (TID) | OROMUCOSAL | Status: DC

## 2014-07-05 DIAGNOSIS — D509 Iron deficiency anemia, unspecified: Secondary | ICD-10-CM | POA: Diagnosis not present

## 2014-07-07 ENCOUNTER — Other Ambulatory Visit: Payer: Self-pay | Admitting: Nurse Practitioner

## 2014-07-12 DIAGNOSIS — S161XXA Strain of muscle, fascia and tendon at neck level, initial encounter: Secondary | ICD-10-CM | POA: Diagnosis not present

## 2014-07-12 DIAGNOSIS — M9903 Segmental and somatic dysfunction of lumbar region: Secondary | ICD-10-CM | POA: Diagnosis not present

## 2014-07-12 DIAGNOSIS — M47816 Spondylosis without myelopathy or radiculopathy, lumbar region: Secondary | ICD-10-CM | POA: Diagnosis not present

## 2014-07-12 DIAGNOSIS — M546 Pain in thoracic spine: Secondary | ICD-10-CM | POA: Diagnosis not present

## 2014-07-12 DIAGNOSIS — M9902 Segmental and somatic dysfunction of thoracic region: Secondary | ICD-10-CM | POA: Diagnosis not present

## 2014-07-12 DIAGNOSIS — M4004 Postural kyphosis, thoracic region: Secondary | ICD-10-CM | POA: Diagnosis not present

## 2014-07-12 DIAGNOSIS — M9901 Segmental and somatic dysfunction of cervical region: Secondary | ICD-10-CM | POA: Diagnosis not present

## 2014-07-12 DIAGNOSIS — M47812 Spondylosis without myelopathy or radiculopathy, cervical region: Secondary | ICD-10-CM | POA: Diagnosis not present

## 2014-07-26 DIAGNOSIS — M9901 Segmental and somatic dysfunction of cervical region: Secondary | ICD-10-CM | POA: Diagnosis not present

## 2014-07-26 DIAGNOSIS — M546 Pain in thoracic spine: Secondary | ICD-10-CM | POA: Diagnosis not present

## 2014-07-26 DIAGNOSIS — M4004 Postural kyphosis, thoracic region: Secondary | ICD-10-CM | POA: Diagnosis not present

## 2014-07-26 DIAGNOSIS — D225 Melanocytic nevi of trunk: Secondary | ICD-10-CM | POA: Diagnosis not present

## 2014-07-26 DIAGNOSIS — S161XXA Strain of muscle, fascia and tendon at neck level, initial encounter: Secondary | ICD-10-CM | POA: Diagnosis not present

## 2014-07-26 DIAGNOSIS — D485 Neoplasm of uncertain behavior of skin: Secondary | ICD-10-CM | POA: Diagnosis not present

## 2014-07-26 DIAGNOSIS — S39012A Strain of muscle, fascia and tendon of lower back, initial encounter: Secondary | ICD-10-CM | POA: Diagnosis not present

## 2014-07-26 DIAGNOSIS — L710 Perioral dermatitis: Secondary | ICD-10-CM | POA: Diagnosis not present

## 2014-07-26 DIAGNOSIS — M47812 Spondylosis without myelopathy or radiculopathy, cervical region: Secondary | ICD-10-CM | POA: Diagnosis not present

## 2014-07-26 DIAGNOSIS — M47816 Spondylosis without myelopathy or radiculopathy, lumbar region: Secondary | ICD-10-CM | POA: Diagnosis not present

## 2014-07-26 DIAGNOSIS — L910 Hypertrophic scar: Secondary | ICD-10-CM | POA: Diagnosis not present

## 2014-07-26 DIAGNOSIS — M9902 Segmental and somatic dysfunction of thoracic region: Secondary | ICD-10-CM | POA: Diagnosis not present

## 2014-07-26 DIAGNOSIS — M9903 Segmental and somatic dysfunction of lumbar region: Secondary | ICD-10-CM | POA: Diagnosis not present

## 2014-08-05 DIAGNOSIS — Z6834 Body mass index (BMI) 34.0-34.9, adult: Secondary | ICD-10-CM | POA: Diagnosis not present

## 2014-08-05 DIAGNOSIS — Z9884 Bariatric surgery status: Secondary | ICD-10-CM | POA: Diagnosis not present

## 2014-08-05 DIAGNOSIS — R110 Nausea: Secondary | ICD-10-CM | POA: Diagnosis not present

## 2014-08-05 DIAGNOSIS — Z885 Allergy status to narcotic agent status: Secondary | ICD-10-CM | POA: Diagnosis not present

## 2014-08-05 DIAGNOSIS — Z9049 Acquired absence of other specified parts of digestive tract: Secondary | ICD-10-CM | POA: Diagnosis not present

## 2014-08-05 DIAGNOSIS — E6609 Other obesity due to excess calories: Secondary | ICD-10-CM | POA: Diagnosis not present

## 2014-08-05 DIAGNOSIS — F418 Other specified anxiety disorders: Secondary | ICD-10-CM | POA: Diagnosis not present

## 2014-08-05 DIAGNOSIS — Z79899 Other long term (current) drug therapy: Secondary | ICD-10-CM | POA: Diagnosis not present

## 2014-08-05 DIAGNOSIS — Z9889 Other specified postprocedural states: Secondary | ICD-10-CM | POA: Diagnosis not present

## 2014-08-05 DIAGNOSIS — K210 Gastro-esophageal reflux disease with esophagitis: Secondary | ICD-10-CM | POA: Diagnosis not present

## 2014-08-05 DIAGNOSIS — Z881 Allergy status to other antibiotic agents status: Secondary | ICD-10-CM | POA: Diagnosis not present

## 2014-08-05 DIAGNOSIS — Z91048 Other nonmedicinal substance allergy status: Secondary | ICD-10-CM | POA: Diagnosis not present

## 2014-08-06 DIAGNOSIS — Z79899 Other long term (current) drug therapy: Secondary | ICD-10-CM | POA: Diagnosis not present

## 2014-08-06 DIAGNOSIS — K210 Gastro-esophageal reflux disease with esophagitis: Secondary | ICD-10-CM | POA: Diagnosis not present

## 2014-08-06 DIAGNOSIS — F418 Other specified anxiety disorders: Secondary | ICD-10-CM | POA: Diagnosis not present

## 2014-08-06 DIAGNOSIS — Z881 Allergy status to other antibiotic agents status: Secondary | ICD-10-CM | POA: Diagnosis not present

## 2014-08-06 DIAGNOSIS — Z9049 Acquired absence of other specified parts of digestive tract: Secondary | ICD-10-CM | POA: Diagnosis not present

## 2014-08-06 DIAGNOSIS — Z9889 Other specified postprocedural states: Secondary | ICD-10-CM | POA: Diagnosis not present

## 2014-08-09 ENCOUNTER — Other Ambulatory Visit: Payer: Self-pay | Admitting: Nurse Practitioner

## 2014-08-10 DIAGNOSIS — M9903 Segmental and somatic dysfunction of lumbar region: Secondary | ICD-10-CM | POA: Diagnosis not present

## 2014-08-10 DIAGNOSIS — M9902 Segmental and somatic dysfunction of thoracic region: Secondary | ICD-10-CM | POA: Diagnosis not present

## 2014-08-10 DIAGNOSIS — S161XXA Strain of muscle, fascia and tendon at neck level, initial encounter: Secondary | ICD-10-CM | POA: Diagnosis not present

## 2014-08-10 DIAGNOSIS — M546 Pain in thoracic spine: Secondary | ICD-10-CM | POA: Diagnosis not present

## 2014-08-10 DIAGNOSIS — M47816 Spondylosis without myelopathy or radiculopathy, lumbar region: Secondary | ICD-10-CM | POA: Diagnosis not present

## 2014-08-10 DIAGNOSIS — M47812 Spondylosis without myelopathy or radiculopathy, cervical region: Secondary | ICD-10-CM | POA: Diagnosis not present

## 2014-08-10 DIAGNOSIS — M4004 Postural kyphosis, thoracic region: Secondary | ICD-10-CM | POA: Diagnosis not present

## 2014-08-10 DIAGNOSIS — S39012A Strain of muscle, fascia and tendon of lower back, initial encounter: Secondary | ICD-10-CM | POA: Diagnosis not present

## 2014-08-10 DIAGNOSIS — M9901 Segmental and somatic dysfunction of cervical region: Secondary | ICD-10-CM | POA: Diagnosis not present

## 2014-08-16 ENCOUNTER — Other Ambulatory Visit: Payer: Self-pay | Admitting: Obstetrics and Gynecology

## 2014-08-16 ENCOUNTER — Ambulatory Visit (INDEPENDENT_AMBULATORY_CARE_PROVIDER_SITE_OTHER): Payer: Medicare Other | Admitting: Obstetrics and Gynecology

## 2014-08-16 ENCOUNTER — Encounter: Payer: Self-pay | Admitting: Obstetrics and Gynecology

## 2014-08-16 VITALS — BP 120/80 | Ht 64.0 in | Wt 200.0 lb

## 2014-08-16 DIAGNOSIS — Z139 Encounter for screening, unspecified: Secondary | ICD-10-CM

## 2014-08-16 DIAGNOSIS — Z20828 Contact with and (suspected) exposure to other viral communicable diseases: Secondary | ICD-10-CM | POA: Diagnosis not present

## 2014-08-16 DIAGNOSIS — N898 Other specified noninflammatory disorders of vagina: Secondary | ICD-10-CM

## 2014-08-16 DIAGNOSIS — Z113 Encounter for screening for infections with a predominantly sexual mode of transmission: Secondary | ICD-10-CM | POA: Diagnosis not present

## 2014-08-16 DIAGNOSIS — Z118 Encounter for screening for other infectious and parasitic diseases: Secondary | ICD-10-CM | POA: Diagnosis not present

## 2014-08-16 DIAGNOSIS — Z7289 Other problems related to lifestyle: Secondary | ICD-10-CM | POA: Diagnosis not present
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
## 2014-08-17 ENCOUNTER — Telehealth: Payer: Self-pay | Admitting: Obstetrics and Gynecology

## 2014-08-17 LAB — GC/CHLAMYDIA PROBE AMP
Chlamydia trachomatis, NAA: NEGATIVE
Neisseria gonorrhoeae by PCR: NEGATIVE

## 2014-08-18 ENCOUNTER — Telehealth: Payer: Self-pay | Admitting: Obstetrics and Gynecology

## 2014-08-18 LAB — HIV ANTIBODY (ROUTINE TESTING W REFLEX): HIV Screen 4th Generation wRfx: NONREACTIVE

## 2014-08-18 LAB — RPR: RPR: NONREACTIVE

## 2014-08-18 LAB — HSV 2 ANTIBODY, IGG: HSV 2 Glycoprotein G Ab, IgG: <0.91 index (ref 0.00–0.90)

## 2014-08-18 LAB — HEPATITIS B SURFACE ANTIGEN: Hepatitis B Surface Ag: NEGATIVE

## 2014-08-18 NOTE — Telephone Encounter (Signed)
Pt called again

## 2014-08-18 NOTE — Telephone Encounter (Signed)
Pt aware of lab results. Pt wants results mailed to her. I advised the pt I would have to find and see, but I could I would mail them to her.

## 2014-08-19 ENCOUNTER — Encounter: Payer: Self-pay | Admitting: Obstetrics and Gynecology

## 2014-08-23 DIAGNOSIS — M47812 Spondylosis without myelopathy or radiculopathy, cervical region: Secondary | ICD-10-CM | POA: Diagnosis not present

## 2014-08-23 DIAGNOSIS — S161XXA Strain of muscle, fascia and tendon at neck level, initial encounter: Secondary | ICD-10-CM | POA: Diagnosis not present

## 2014-08-23 DIAGNOSIS — M546 Pain in thoracic spine: Secondary | ICD-10-CM | POA: Diagnosis not present

## 2014-08-23 DIAGNOSIS — M47816 Spondylosis without myelopathy or radiculopathy, lumbar region: Secondary | ICD-10-CM | POA: Diagnosis not present

## 2014-08-23 DIAGNOSIS — M4004 Postural kyphosis, thoracic region: Secondary | ICD-10-CM | POA: Diagnosis not present

## 2014-08-23 DIAGNOSIS — M9902 Segmental and somatic dysfunction of thoracic region: Secondary | ICD-10-CM | POA: Diagnosis not present

## 2014-08-23 DIAGNOSIS — S39012A Strain of muscle, fascia and tendon of lower back, initial encounter: Secondary | ICD-10-CM | POA: Diagnosis not present

## 2014-08-23 DIAGNOSIS — M9903 Segmental and somatic dysfunction of lumbar region: Secondary | ICD-10-CM | POA: Diagnosis not present

## 2014-08-23 DIAGNOSIS — M9901 Segmental and somatic dysfunction of cervical region: Secondary | ICD-10-CM | POA: Diagnosis not present

## 2014-08-25 DIAGNOSIS — Z4889 Encounter for other specified surgical aftercare: Secondary | ICD-10-CM | POA: Diagnosis not present

## 2014-08-30 DIAGNOSIS — K912 Postsurgical malabsorption, not elsewhere classified: Secondary | ICD-10-CM | POA: Insufficient documentation

## 2014-09-06 DIAGNOSIS — F322 Major depressive disorder, single episode, severe without psychotic features: Secondary | ICD-10-CM | POA: Diagnosis not present

## 2014-09-06 DIAGNOSIS — F4310 Post-traumatic stress disorder, unspecified: Secondary | ICD-10-CM | POA: Diagnosis not present

## 2014-09-06 DIAGNOSIS — Z452 Encounter for adjustment and management of vascular access device: Secondary | ICD-10-CM | POA: Diagnosis not present

## 2014-09-20 DIAGNOSIS — S161XXA Strain of muscle, fascia and tendon at neck level, initial encounter: Secondary | ICD-10-CM | POA: Diagnosis not present

## 2014-09-20 DIAGNOSIS — M9903 Segmental and somatic dysfunction of lumbar region: Secondary | ICD-10-CM | POA: Diagnosis not present

## 2014-09-20 DIAGNOSIS — M9902 Segmental and somatic dysfunction of thoracic region: Secondary | ICD-10-CM | POA: Diagnosis not present

## 2014-09-20 DIAGNOSIS — M9901 Segmental and somatic dysfunction of cervical region: Secondary | ICD-10-CM | POA: Diagnosis not present

## 2014-09-20 DIAGNOSIS — M47816 Spondylosis without myelopathy or radiculopathy, lumbar region: Secondary | ICD-10-CM | POA: Diagnosis not present

## 2014-09-20 DIAGNOSIS — M47812 Spondylosis without myelopathy or radiculopathy, cervical region: Secondary | ICD-10-CM | POA: Diagnosis not present

## 2014-09-20 DIAGNOSIS — S39012A Strain of muscle, fascia and tendon of lower back, initial encounter: Secondary | ICD-10-CM | POA: Diagnosis not present

## 2014-09-20 DIAGNOSIS — M546 Pain in thoracic spine: Secondary | ICD-10-CM | POA: Diagnosis not present

## 2014-09-20 DIAGNOSIS — M4004 Postural kyphosis, thoracic region: Secondary | ICD-10-CM | POA: Diagnosis not present

## 2014-10-11 DIAGNOSIS — M47816 Spondylosis without myelopathy or radiculopathy, lumbar region: Secondary | ICD-10-CM | POA: Diagnosis not present

## 2014-10-11 DIAGNOSIS — M9902 Segmental and somatic dysfunction of thoracic region: Secondary | ICD-10-CM | POA: Diagnosis not present

## 2014-10-11 DIAGNOSIS — S39012A Strain of muscle, fascia and tendon of lower back, initial encounter: Secondary | ICD-10-CM | POA: Diagnosis not present

## 2014-10-11 DIAGNOSIS — M9903 Segmental and somatic dysfunction of lumbar region: Secondary | ICD-10-CM | POA: Diagnosis not present

## 2014-10-11 DIAGNOSIS — S338XXA Sprain of other parts of lumbar spine and pelvis, initial encounter: Secondary | ICD-10-CM | POA: Diagnosis not present

## 2014-10-11 DIAGNOSIS — M546 Pain in thoracic spine: Secondary | ICD-10-CM | POA: Diagnosis not present

## 2014-10-11 DIAGNOSIS — M47812 Spondylosis without myelopathy or radiculopathy, cervical region: Secondary | ICD-10-CM | POA: Diagnosis not present

## 2014-10-11 DIAGNOSIS — S134XXA Sprain of ligaments of cervical spine, initial encounter: Secondary | ICD-10-CM | POA: Diagnosis not present

## 2014-10-11 DIAGNOSIS — M9901 Segmental and somatic dysfunction of cervical region: Secondary | ICD-10-CM | POA: Diagnosis not present

## 2014-10-11 DIAGNOSIS — M4004 Postural kyphosis, thoracic region: Secondary | ICD-10-CM | POA: Diagnosis not present

## 2014-10-25 DIAGNOSIS — S338XXA Sprain of other parts of lumbar spine and pelvis, initial encounter: Secondary | ICD-10-CM | POA: Diagnosis not present

## 2014-10-25 DIAGNOSIS — M546 Pain in thoracic spine: Secondary | ICD-10-CM | POA: Diagnosis not present

## 2014-10-25 DIAGNOSIS — M9901 Segmental and somatic dysfunction of cervical region: Secondary | ICD-10-CM | POA: Diagnosis not present

## 2014-10-25 DIAGNOSIS — M9902 Segmental and somatic dysfunction of thoracic region: Secondary | ICD-10-CM | POA: Diagnosis not present

## 2014-10-25 DIAGNOSIS — M4004 Postural kyphosis, thoracic region: Secondary | ICD-10-CM | POA: Diagnosis not present

## 2014-10-25 DIAGNOSIS — M47812 Spondylosis without myelopathy or radiculopathy, cervical region: Secondary | ICD-10-CM | POA: Diagnosis not present

## 2014-10-25 DIAGNOSIS — S134XXA Sprain of ligaments of cervical spine, initial encounter: Secondary | ICD-10-CM | POA: Diagnosis not present

## 2014-10-25 DIAGNOSIS — M9903 Segmental and somatic dysfunction of lumbar region: Secondary | ICD-10-CM | POA: Diagnosis not present

## 2014-10-25 DIAGNOSIS — M47816 Spondylosis without myelopathy or radiculopathy, lumbar region: Secondary | ICD-10-CM | POA: Diagnosis not present

## 2014-10-25 DIAGNOSIS — S39012A Strain of muscle, fascia and tendon of lower back, initial encounter: Secondary | ICD-10-CM | POA: Diagnosis not present

## 2014-10-26 DIAGNOSIS — D1800 Hemangioma unspecified site: Secondary | ICD-10-CM | POA: Diagnosis not present

## 2014-10-26 DIAGNOSIS — L570 Actinic keratosis: Secondary | ICD-10-CM | POA: Diagnosis not present

## 2014-11-07 ENCOUNTER — Other Ambulatory Visit: Payer: Self-pay | Admitting: Nurse Practitioner

## 2014-11-18 DIAGNOSIS — E559 Vitamin D deficiency, unspecified: Secondary | ICD-10-CM | POA: Diagnosis not present

## 2014-11-18 DIAGNOSIS — Z7901 Long term (current) use of anticoagulants: Secondary | ICD-10-CM | POA: Diagnosis not present

## 2014-11-18 DIAGNOSIS — E538 Deficiency of other specified B group vitamins: Secondary | ICD-10-CM | POA: Diagnosis not present

## 2014-11-18 DIAGNOSIS — Z91048 Other nonmedicinal substance allergy status: Secondary | ICD-10-CM | POA: Diagnosis not present

## 2014-11-18 DIAGNOSIS — E611 Iron deficiency: Secondary | ICD-10-CM | POA: Diagnosis not present

## 2014-11-18 DIAGNOSIS — Z881 Allergy status to other antibiotic agents status: Secondary | ICD-10-CM | POA: Diagnosis not present

## 2014-11-18 DIAGNOSIS — Z79899 Other long term (current) drug therapy: Secondary | ICD-10-CM | POA: Diagnosis not present

## 2014-11-18 DIAGNOSIS — Z885 Allergy status to narcotic agent status: Secondary | ICD-10-CM | POA: Diagnosis not present

## 2014-11-18 DIAGNOSIS — Z9884 Bariatric surgery status: Secondary | ICD-10-CM | POA: Diagnosis not present

## 2014-11-18 DIAGNOSIS — K30 Functional dyspepsia: Secondary | ICD-10-CM | POA: Diagnosis not present

## 2014-11-18 DIAGNOSIS — I82409 Acute embolism and thrombosis of unspecified deep veins of unspecified lower extremity: Secondary | ICD-10-CM | POA: Diagnosis not present

## 2014-11-18 DIAGNOSIS — D649 Anemia, unspecified: Secondary | ICD-10-CM | POA: Diagnosis not present

## 2014-11-29 ENCOUNTER — Other Ambulatory Visit: Payer: Self-pay

## 2014-12-01 DIAGNOSIS — K9589 Other complications of other bariatric procedure: Secondary | ICD-10-CM | POA: Diagnosis not present

## 2014-12-01 DIAGNOSIS — K210 Gastro-esophageal reflux disease with esophagitis: Secondary | ICD-10-CM | POA: Diagnosis not present

## 2014-12-01 DIAGNOSIS — K219 Gastro-esophageal reflux disease without esophagitis: Secondary | ICD-10-CM | POA: Diagnosis not present

## 2014-12-02 DIAGNOSIS — E669 Obesity, unspecified: Secondary | ICD-10-CM | POA: Diagnosis not present

## 2014-12-02 DIAGNOSIS — Z01419 Encounter for gynecological examination (general) (routine) without abnormal findings: Secondary | ICD-10-CM | POA: Diagnosis not present

## 2014-12-02 DIAGNOSIS — F419 Anxiety disorder, unspecified: Secondary | ICD-10-CM | POA: Diagnosis not present

## 2014-12-02 DIAGNOSIS — Z0001 Encounter for general adult medical examination with abnormal findings: Secondary | ICD-10-CM | POA: Diagnosis not present

## 2014-12-02 DIAGNOSIS — Z888 Allergy status to other drugs, medicaments and biological substances status: Secondary | ICD-10-CM | POA: Diagnosis not present

## 2014-12-02 DIAGNOSIS — I129 Hypertensive chronic kidney disease with stage 1 through stage 4 chronic kidney disease, or unspecified chronic kidney disease: Secondary | ICD-10-CM | POA: Diagnosis not present

## 2014-12-02 DIAGNOSIS — E282 Polycystic ovarian syndrome: Secondary | ICD-10-CM | POA: Diagnosis not present

## 2014-12-02 DIAGNOSIS — Z124 Encounter for screening for malignant neoplasm of cervix: Secondary | ICD-10-CM | POA: Diagnosis not present

## 2014-12-02 DIAGNOSIS — L680 Hirsutism: Secondary | ICD-10-CM | POA: Diagnosis not present

## 2014-12-02 DIAGNOSIS — E1122 Type 2 diabetes mellitus with diabetic chronic kidney disease: Secondary | ICD-10-CM | POA: Diagnosis not present

## 2014-12-02 DIAGNOSIS — F329 Major depressive disorder, single episode, unspecified: Secondary | ICD-10-CM | POA: Diagnosis not present

## 2014-12-02 DIAGNOSIS — K219 Gastro-esophageal reflux disease without esophagitis: Secondary | ICD-10-CM | POA: Diagnosis not present

## 2014-12-02 DIAGNOSIS — N189 Chronic kidney disease, unspecified: Secondary | ICD-10-CM | POA: Diagnosis not present

## 2014-12-02 DIAGNOSIS — Z885 Allergy status to narcotic agent status: Secondary | ICD-10-CM | POA: Diagnosis not present

## 2014-12-02 DIAGNOSIS — Z79899 Other long term (current) drug therapy: Secondary | ICD-10-CM | POA: Diagnosis not present

## 2014-12-02 DIAGNOSIS — Z6833 Body mass index (BMI) 33.0-33.9, adult: Secondary | ICD-10-CM | POA: Diagnosis not present

## 2014-12-08 DIAGNOSIS — Z903 Acquired absence of stomach [part of]: Secondary | ICD-10-CM | POA: Diagnosis not present

## 2014-12-08 DIAGNOSIS — E611 Iron deficiency: Secondary | ICD-10-CM | POA: Diagnosis not present

## 2014-12-08 DIAGNOSIS — K219 Gastro-esophageal reflux disease without esophagitis: Secondary | ICD-10-CM | POA: Diagnosis not present

## 2014-12-08 DIAGNOSIS — E282 Polycystic ovarian syndrome: Secondary | ICD-10-CM | POA: Diagnosis not present

## 2014-12-09 DIAGNOSIS — S83281A Other tear of lateral meniscus, current injury, right knee, initial encounter: Secondary | ICD-10-CM | POA: Diagnosis not present

## 2014-12-20 DIAGNOSIS — F322 Major depressive disorder, single episode, severe without psychotic features: Secondary | ICD-10-CM | POA: Diagnosis not present

## 2014-12-20 DIAGNOSIS — F4310 Post-traumatic stress disorder, unspecified: Secondary | ICD-10-CM | POA: Diagnosis not present

## 2014-12-21 DIAGNOSIS — S83281D Other tear of lateral meniscus, current injury, right knee, subsequent encounter: Secondary | ICD-10-CM | POA: Diagnosis not present

## 2014-12-25 DIAGNOSIS — M25561 Pain in right knee: Secondary | ICD-10-CM | POA: Diagnosis not present

## 2014-12-30 DIAGNOSIS — D509 Iron deficiency anemia, unspecified: Secondary | ICD-10-CM | POA: Diagnosis not present

## 2015-01-04 ENCOUNTER — Ambulatory Visit: Payer: Self-pay | Admitting: Orthopedic Surgery

## 2015-01-07 DIAGNOSIS — M23261 Derangement of other lateral meniscus due to old tear or injury, right knee: Secondary | ICD-10-CM | POA: Diagnosis not present

## 2015-01-07 DIAGNOSIS — S83281A Other tear of lateral meniscus, current injury, right knee, initial encounter: Secondary | ICD-10-CM | POA: Diagnosis not present

## 2015-01-07 DIAGNOSIS — M94261 Chondromalacia, right knee: Secondary | ICD-10-CM | POA: Diagnosis not present

## 2015-01-07 DIAGNOSIS — S83241A Other tear of medial meniscus, current injury, right knee, initial encounter: Secondary | ICD-10-CM | POA: Diagnosis not present

## 2015-01-07 DIAGNOSIS — M23221 Derangement of posterior horn of medial meniscus due to old tear or injury, right knee: Secondary | ICD-10-CM | POA: Diagnosis not present

## 2015-01-07 DIAGNOSIS — G8918 Other acute postprocedural pain: Secondary | ICD-10-CM | POA: Diagnosis not present

## 2015-01-13 ENCOUNTER — Other Ambulatory Visit (HOSPITAL_COMMUNITY): Payer: Self-pay | Admitting: Orthopedic Surgery

## 2015-01-13 ENCOUNTER — Ambulatory Visit (HOSPITAL_COMMUNITY)
Admission: RE | Admit: 2015-01-13 | Discharge: 2015-01-13 | Disposition: A | Discharge status: Home / Self Care | Payer: Medicare Other | Source: Ambulatory Visit | Attending: Cardiology | Admitting: Cardiology

## 2015-01-13 DIAGNOSIS — M79604 Pain in right leg: Secondary | ICD-10-CM | POA: Insufficient documentation

## 2015-01-13 DIAGNOSIS — S83281D Other tear of lateral meniscus, current injury, right knee, subsequent encounter: Secondary | ICD-10-CM | POA: Diagnosis not present

## 2015-01-13 DIAGNOSIS — S83241D Other tear of medial meniscus, current injury, right knee, subsequent encounter: Secondary | ICD-10-CM | POA: Diagnosis not present

## 2015-01-17 DIAGNOSIS — M546 Pain in thoracic spine: Secondary | ICD-10-CM | POA: Diagnosis not present

## 2015-01-17 DIAGNOSIS — S134XXA Sprain of ligaments of cervical spine, initial encounter: Secondary | ICD-10-CM | POA: Diagnosis not present

## 2015-01-17 DIAGNOSIS — M9903 Segmental and somatic dysfunction of lumbar region: Secondary | ICD-10-CM | POA: Diagnosis not present

## 2015-01-17 DIAGNOSIS — M9902 Segmental and somatic dysfunction of thoracic region: Secondary | ICD-10-CM | POA: Diagnosis not present

## 2015-01-17 DIAGNOSIS — M4004 Postural kyphosis, thoracic region: Secondary | ICD-10-CM | POA: Diagnosis not present

## 2015-01-17 DIAGNOSIS — R262 Difficulty in walking, not elsewhere classified: Secondary | ICD-10-CM | POA: Diagnosis not present

## 2015-01-17 DIAGNOSIS — Z4789 Encounter for other orthopedic aftercare: Secondary | ICD-10-CM | POA: Diagnosis not present

## 2015-01-17 DIAGNOSIS — M47816 Spondylosis without myelopathy or radiculopathy, lumbar region: Secondary | ICD-10-CM | POA: Diagnosis not present

## 2015-01-17 DIAGNOSIS — M9901 Segmental and somatic dysfunction of cervical region: Secondary | ICD-10-CM | POA: Diagnosis not present

## 2015-01-17 DIAGNOSIS — M25561 Pain in right knee: Secondary | ICD-10-CM | POA: Diagnosis not present

## 2015-01-17 DIAGNOSIS — S39012A Strain of muscle, fascia and tendon of lower back, initial encounter: Secondary | ICD-10-CM | POA: Diagnosis not present

## 2015-01-17 DIAGNOSIS — M47812 Spondylosis without myelopathy or radiculopathy, cervical region: Secondary | ICD-10-CM | POA: Diagnosis not present

## 2015-01-17 DIAGNOSIS — M6281 Muscle weakness (generalized): Secondary | ICD-10-CM | POA: Diagnosis not present

## 2015-01-17 DIAGNOSIS — S338XXA Sprain of other parts of lumbar spine and pelvis, initial encounter: Secondary | ICD-10-CM | POA: Diagnosis not present

## 2015-01-20 ENCOUNTER — Ambulatory Visit (INDEPENDENT_AMBULATORY_CARE_PROVIDER_SITE_OTHER): Payer: Medicare Other | Admitting: Advanced Practice Midwife

## 2015-01-20 ENCOUNTER — Encounter: Payer: Self-pay | Admitting: Advanced Practice Midwife

## 2015-01-20 VITALS — BP 110/80 | HR 72 | Wt 197.0 lb

## 2015-01-20 DIAGNOSIS — N898 Other specified noninflammatory disorders of vagina: Secondary | ICD-10-CM | POA: Diagnosis not present

## 2015-01-20 DIAGNOSIS — M25561 Pain in right knee: Secondary | ICD-10-CM | POA: Diagnosis not present

## 2015-01-20 DIAGNOSIS — M6281 Muscle weakness (generalized): Secondary | ICD-10-CM | POA: Diagnosis not present

## 2015-01-20 DIAGNOSIS — Z4789 Encounter for other orthopedic aftercare: Secondary | ICD-10-CM | POA: Diagnosis not present

## 2015-01-20 DIAGNOSIS — R262 Difficulty in walking, not elsewhere classified: Secondary | ICD-10-CM | POA: Diagnosis not present

## 2015-01-20 MED ORDER — METRONIDAZOLE 0.75 % VA GEL
1.0000 | Freq: Every day | VAGINAL | Status: DC
Start: 2015-01-20 — End: 2016-01-12
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
## 2015-01-24 DIAGNOSIS — M9903 Segmental and somatic dysfunction of lumbar region: Secondary | ICD-10-CM | POA: Diagnosis not present

## 2015-01-24 DIAGNOSIS — Z4789 Encounter for other orthopedic aftercare: Secondary | ICD-10-CM | POA: Diagnosis not present

## 2015-01-24 DIAGNOSIS — S134XXA Sprain of ligaments of cervical spine, initial encounter: Secondary | ICD-10-CM | POA: Diagnosis not present

## 2015-01-24 DIAGNOSIS — M4004 Postural kyphosis, thoracic region: Secondary | ICD-10-CM | POA: Diagnosis not present

## 2015-01-24 DIAGNOSIS — M25561 Pain in right knee: Secondary | ICD-10-CM | POA: Diagnosis not present

## 2015-01-24 DIAGNOSIS — M6281 Muscle weakness (generalized): Secondary | ICD-10-CM | POA: Diagnosis not present

## 2015-01-24 DIAGNOSIS — L680 Hirsutism: Secondary | ICD-10-CM | POA: Diagnosis not present

## 2015-01-24 DIAGNOSIS — M9902 Segmental and somatic dysfunction of thoracic region: Secondary | ICD-10-CM | POA: Diagnosis not present

## 2015-01-24 DIAGNOSIS — S338XXA Sprain of other parts of lumbar spine and pelvis, initial encounter: Secondary | ICD-10-CM | POA: Diagnosis not present

## 2015-01-24 DIAGNOSIS — M47812 Spondylosis without myelopathy or radiculopathy, cervical region: Secondary | ICD-10-CM | POA: Diagnosis not present

## 2015-01-24 DIAGNOSIS — M546 Pain in thoracic spine: Secondary | ICD-10-CM | POA: Diagnosis not present

## 2015-01-24 DIAGNOSIS — D235 Other benign neoplasm of skin of trunk: Secondary | ICD-10-CM | POA: Diagnosis not present

## 2015-01-24 DIAGNOSIS — S39012A Strain of muscle, fascia and tendon of lower back, initial encounter: Secondary | ICD-10-CM | POA: Diagnosis not present

## 2015-01-24 DIAGNOSIS — M47816 Spondylosis without myelopathy or radiculopathy, lumbar region: Secondary | ICD-10-CM | POA: Diagnosis not present

## 2015-01-24 DIAGNOSIS — M9901 Segmental and somatic dysfunction of cervical region: Secondary | ICD-10-CM | POA: Diagnosis not present

## 2015-01-24 DIAGNOSIS — R262 Difficulty in walking, not elsewhere classified: Secondary | ICD-10-CM | POA: Diagnosis not present

## 2015-01-25 DIAGNOSIS — M25562 Pain in left knee: Secondary | ICD-10-CM | POA: Diagnosis not present

## 2015-01-25 DIAGNOSIS — Z23 Encounter for immunization: Secondary | ICD-10-CM | POA: Diagnosis not present

## 2015-01-31 DIAGNOSIS — R262 Difficulty in walking, not elsewhere classified: Secondary | ICD-10-CM | POA: Diagnosis not present

## 2015-01-31 DIAGNOSIS — M25561 Pain in right knee: Secondary | ICD-10-CM | POA: Diagnosis not present

## 2015-01-31 DIAGNOSIS — M9901 Segmental and somatic dysfunction of cervical region: Secondary | ICD-10-CM | POA: Diagnosis not present

## 2015-01-31 DIAGNOSIS — M4004 Postural kyphosis, thoracic region: Secondary | ICD-10-CM | POA: Diagnosis not present

## 2015-01-31 DIAGNOSIS — M9903 Segmental and somatic dysfunction of lumbar region: Secondary | ICD-10-CM | POA: Diagnosis not present

## 2015-01-31 DIAGNOSIS — M9902 Segmental and somatic dysfunction of thoracic region: Secondary | ICD-10-CM | POA: Diagnosis not present

## 2015-01-31 DIAGNOSIS — M47812 Spondylosis without myelopathy or radiculopathy, cervical region: Secondary | ICD-10-CM | POA: Diagnosis not present

## 2015-01-31 DIAGNOSIS — S338XXA Sprain of other parts of lumbar spine and pelvis, initial encounter: Secondary | ICD-10-CM | POA: Diagnosis not present

## 2015-01-31 DIAGNOSIS — Z4789 Encounter for other orthopedic aftercare: Secondary | ICD-10-CM | POA: Diagnosis not present

## 2015-01-31 DIAGNOSIS — M546 Pain in thoracic spine: Secondary | ICD-10-CM | POA: Diagnosis not present

## 2015-01-31 DIAGNOSIS — M6281 Muscle weakness (generalized): Secondary | ICD-10-CM | POA: Diagnosis not present

## 2015-01-31 DIAGNOSIS — M47816 Spondylosis without myelopathy or radiculopathy, lumbar region: Secondary | ICD-10-CM | POA: Diagnosis not present

## 2015-01-31 DIAGNOSIS — S39012A Strain of muscle, fascia and tendon of lower back, initial encounter: Secondary | ICD-10-CM | POA: Diagnosis not present

## 2015-01-31 DIAGNOSIS — S134XXA Sprain of ligaments of cervical spine, initial encounter: Secondary | ICD-10-CM | POA: Diagnosis not present

## 2015-02-02 DIAGNOSIS — M25561 Pain in right knee: Secondary | ICD-10-CM | POA: Diagnosis not present

## 2015-02-02 DIAGNOSIS — R262 Difficulty in walking, not elsewhere classified: Secondary | ICD-10-CM | POA: Diagnosis not present

## 2015-02-02 DIAGNOSIS — M6281 Muscle weakness (generalized): Secondary | ICD-10-CM | POA: Diagnosis not present

## 2015-02-02 DIAGNOSIS — Z4789 Encounter for other orthopedic aftercare: Secondary | ICD-10-CM | POA: Diagnosis not present

## 2015-02-06 ENCOUNTER — Other Ambulatory Visit: Payer: Self-pay | Admitting: Nurse Practitioner

## 2015-02-08 DIAGNOSIS — M25561 Pain in right knee: Secondary | ICD-10-CM | POA: Diagnosis not present

## 2015-02-08 DIAGNOSIS — R262 Difficulty in walking, not elsewhere classified: Secondary | ICD-10-CM | POA: Diagnosis not present

## 2015-02-08 DIAGNOSIS — M6281 Muscle weakness (generalized): Secondary | ICD-10-CM | POA: Diagnosis not present

## 2015-02-08 DIAGNOSIS — Z4789 Encounter for other orthopedic aftercare: Secondary | ICD-10-CM | POA: Diagnosis not present

## 2015-02-10 ENCOUNTER — Other Ambulatory Visit (HOSPITAL_COMMUNITY): Payer: Self-pay | Admitting: Orthopedic Surgery

## 2015-02-10 DIAGNOSIS — R52 Pain, unspecified: Secondary | ICD-10-CM

## 2015-02-14 DIAGNOSIS — F4310 Post-traumatic stress disorder, unspecified: Secondary | ICD-10-CM | POA: Diagnosis not present

## 2015-02-16 ENCOUNTER — Ambulatory Visit (HOSPITAL_COMMUNITY)
Admission: RE | Admit: 2015-02-16 | Discharge: 2015-02-16 | Disposition: A | Discharge status: Home / Self Care | Payer: Medicare Other | Source: Ambulatory Visit | Attending: Orthopedic Surgery | Admitting: Orthopedic Surgery

## 2015-02-16 DIAGNOSIS — M25562 Pain in left knee: Secondary | ICD-10-CM | POA: Insufficient documentation

## 2015-02-16 DIAGNOSIS — S338XXA Sprain of other parts of lumbar spine and pelvis, initial encounter: Secondary | ICD-10-CM | POA: Diagnosis not present

## 2015-02-16 DIAGNOSIS — R52 Pain, unspecified: Secondary | ICD-10-CM

## 2015-02-16 DIAGNOSIS — M47812 Spondylosis without myelopathy or radiculopathy, cervical region: Secondary | ICD-10-CM | POA: Diagnosis not present

## 2015-02-16 DIAGNOSIS — M4004 Postural kyphosis, thoracic region: Secondary | ICD-10-CM | POA: Diagnosis not present

## 2015-02-16 DIAGNOSIS — M9902 Segmental and somatic dysfunction of thoracic region: Secondary | ICD-10-CM | POA: Diagnosis not present

## 2015-02-16 DIAGNOSIS — S134XXA Sprain of ligaments of cervical spine, initial encounter: Secondary | ICD-10-CM | POA: Diagnosis not present

## 2015-02-16 DIAGNOSIS — M546 Pain in thoracic spine: Secondary | ICD-10-CM | POA: Diagnosis not present

## 2015-02-16 DIAGNOSIS — M7122 Synovial cyst of popliteal space [Baker], left knee: Secondary | ICD-10-CM | POA: Insufficient documentation

## 2015-02-16 DIAGNOSIS — S39012A Strain of muscle, fascia and tendon of lower back, initial encounter: Secondary | ICD-10-CM | POA: Diagnosis not present

## 2015-02-16 DIAGNOSIS — M1712 Unilateral primary osteoarthritis, left knee: Secondary | ICD-10-CM | POA: Insufficient documentation

## 2015-02-16 DIAGNOSIS — M47816 Spondylosis without myelopathy or radiculopathy, lumbar region: Secondary | ICD-10-CM | POA: Diagnosis not present

## 2015-02-16 DIAGNOSIS — M9903 Segmental and somatic dysfunction of lumbar region: Secondary | ICD-10-CM | POA: Diagnosis not present

## 2015-02-16 DIAGNOSIS — M9901 Segmental and somatic dysfunction of cervical region: Secondary | ICD-10-CM | POA: Diagnosis not present

## 2015-02-22 DIAGNOSIS — M4004 Postural kyphosis, thoracic region: Secondary | ICD-10-CM | POA: Diagnosis not present

## 2015-02-22 DIAGNOSIS — S338XXA Sprain of other parts of lumbar spine and pelvis, initial encounter: Secondary | ICD-10-CM | POA: Diagnosis not present

## 2015-02-22 DIAGNOSIS — S134XXA Sprain of ligaments of cervical spine, initial encounter: Secondary | ICD-10-CM | POA: Diagnosis not present

## 2015-02-22 DIAGNOSIS — M47816 Spondylosis without myelopathy or radiculopathy, lumbar region: Secondary | ICD-10-CM | POA: Diagnosis not present

## 2015-02-22 DIAGNOSIS — S83241D Other tear of medial meniscus, current injury, right knee, subsequent encounter: Secondary | ICD-10-CM | POA: Diagnosis not present

## 2015-02-22 DIAGNOSIS — M546 Pain in thoracic spine: Secondary | ICD-10-CM | POA: Diagnosis not present

## 2015-02-22 DIAGNOSIS — M47812 Spondylosis without myelopathy or radiculopathy, cervical region: Secondary | ICD-10-CM | POA: Diagnosis not present

## 2015-02-22 DIAGNOSIS — M9903 Segmental and somatic dysfunction of lumbar region: Secondary | ICD-10-CM | POA: Diagnosis not present

## 2015-02-22 DIAGNOSIS — M9902 Segmental and somatic dysfunction of thoracic region: Secondary | ICD-10-CM | POA: Diagnosis not present

## 2015-02-22 DIAGNOSIS — S83281D Other tear of lateral meniscus, current injury, right knee, subsequent encounter: Secondary | ICD-10-CM | POA: Diagnosis not present

## 2015-02-22 DIAGNOSIS — S39012A Strain of muscle, fascia and tendon of lower back, initial encounter: Secondary | ICD-10-CM | POA: Diagnosis not present

## 2015-02-22 DIAGNOSIS — M9901 Segmental and somatic dysfunction of cervical region: Secondary | ICD-10-CM | POA: Diagnosis not present

## 2015-02-23 DIAGNOSIS — Z23 Encounter for immunization: Secondary | ICD-10-CM | POA: Diagnosis not present

## 2015-03-01 DIAGNOSIS — M4004 Postural kyphosis, thoracic region: Secondary | ICD-10-CM | POA: Diagnosis not present

## 2015-03-01 DIAGNOSIS — M9902 Segmental and somatic dysfunction of thoracic region: Secondary | ICD-10-CM | POA: Diagnosis not present

## 2015-03-01 DIAGNOSIS — M47812 Spondylosis without myelopathy or radiculopathy, cervical region: Secondary | ICD-10-CM | POA: Diagnosis not present

## 2015-03-01 DIAGNOSIS — M47816 Spondylosis without myelopathy or radiculopathy, lumbar region: Secondary | ICD-10-CM | POA: Diagnosis not present

## 2015-03-01 DIAGNOSIS — M9903 Segmental and somatic dysfunction of lumbar region: Secondary | ICD-10-CM | POA: Diagnosis not present

## 2015-03-01 DIAGNOSIS — M9901 Segmental and somatic dysfunction of cervical region: Secondary | ICD-10-CM | POA: Diagnosis not present

## 2015-03-01 DIAGNOSIS — S39012A Strain of muscle, fascia and tendon of lower back, initial encounter: Secondary | ICD-10-CM | POA: Diagnosis not present

## 2015-03-01 DIAGNOSIS — S134XXA Sprain of ligaments of cervical spine, initial encounter: Secondary | ICD-10-CM | POA: Diagnosis not present

## 2015-03-01 DIAGNOSIS — M546 Pain in thoracic spine: Secondary | ICD-10-CM | POA: Diagnosis not present

## 2015-03-01 DIAGNOSIS — S338XXA Sprain of other parts of lumbar spine and pelvis, initial encounter: Secondary | ICD-10-CM | POA: Diagnosis not present

## 2015-03-04 DIAGNOSIS — S83282A Other tear of lateral meniscus, current injury, left knee, initial encounter: Secondary | ICD-10-CM | POA: Diagnosis not present

## 2015-03-04 DIAGNOSIS — Y999 Unspecified external cause status: Secondary | ICD-10-CM | POA: Diagnosis not present

## 2015-03-04 DIAGNOSIS — M94262 Chondromalacia, left knee: Secondary | ICD-10-CM | POA: Diagnosis not present

## 2015-03-04 DIAGNOSIS — G8918 Other acute postprocedural pain: Secondary | ICD-10-CM | POA: Diagnosis not present

## 2015-03-04 DIAGNOSIS — Y929 Unspecified place or not applicable: Secondary | ICD-10-CM | POA: Diagnosis not present

## 2015-03-07 ENCOUNTER — Ambulatory Visit: Payer: Medicare Other | Admitting: Nurse Practitioner

## 2015-03-07 ENCOUNTER — Ambulatory Visit (INDEPENDENT_AMBULATORY_CARE_PROVIDER_SITE_OTHER): Payer: Medicare Other | Admitting: Nurse Practitioner

## 2015-03-07 ENCOUNTER — Encounter: Payer: Self-pay | Admitting: Nurse Practitioner

## 2015-03-07 ENCOUNTER — Telehealth: Payer: Self-pay | Admitting: Nurse Practitioner

## 2015-03-07 VITALS — BP 134/90 | Temp 98.0°F | Ht 64.0 in | Wt 182.0 lb

## 2015-03-07 DIAGNOSIS — J069 Acute upper respiratory infection, unspecified: Secondary | ICD-10-CM

## 2015-03-07 DIAGNOSIS — B9689 Other specified bacterial agents as the cause of diseases classified elsewhere: Secondary | ICD-10-CM

## 2015-03-07 MED ORDER — AZITHROMYCIN 250 MG PO TABS: ORAL_TABLET | ORAL | Status: DC

## 2015-03-07 NOTE — Progress Notes (Signed)
Subjective:  Presents for c/o cough that began 3 days ago after her left knee surgery. No fever. Has a history of chronic GERD; had severe reflux and could not sit up in time. Since then has had a frequent cough producing yellow sputum. Hoarseness. No sore throat or ear pain. Slight wheezing, using her albuterol neb about once per day. Generalized headache.  Objective:   BP 134/90 mmHg  Temp(Src) 98 F (36.7 C) (Oral)  Ht 5\' 4"  (1.626 m)  Wt 182 lb (82.555 kg)  BMI 31.22 kg/m2 NAD. Alert, oriented. TMs clear effusion, no erythema. Pharynx mild erythema with PND noted. Neck supple with mild anterior adenopathy. Lungs clear. Heart RRR. Abdomen soft, mild epigastric tenderness.  Assessment: Bacterial upper respiratory infection  Plan:  Meds ordered this encounter  Medications  . lurasidone (LATUDA) 40 MG TABS tablet    Sig: Take 40 mg by mouth daily with breakfast.  . azithromycin (ZITHROMAX Z-PAK) 250 MG tablet    Sig: Take 2 tablets (500 mg) on  Day 1,  followed by 1 tablet (250 mg) once daily on Days 2 through 5.    Dispense:  6 each    Refill:  0    Order Specific Question:  Supervising Provider    Answer:  Mikey Kirschner [[id number]]   OTC as directed for cough. Discussed measures for reflux. Follow up with GI specialist.  Return if symptoms worsen or fail to improve.

## 2015-03-07 NOTE — Telephone Encounter (Signed)
Pt has appt on the 10th an would like her bw put in please  Call when sent in aware of [facility]

## 2015-03-08 ENCOUNTER — Other Ambulatory Visit: Payer: Self-pay | Admitting: Nurse Practitioner

## 2015-03-08 DIAGNOSIS — K760 Fatty (change of) liver, not elsewhere classified: Secondary | ICD-10-CM

## 2015-03-08 DIAGNOSIS — E538 Deficiency of other specified B group vitamins: Secondary | ICD-10-CM

## 2015-03-08 DIAGNOSIS — R5383 Other fatigue: Secondary | ICD-10-CM

## 2015-03-08 DIAGNOSIS — K912 Postsurgical malabsorption, not elsewhere classified: Secondary | ICD-10-CM

## 2015-03-08 DIAGNOSIS — I10 Essential (primary) hypertension: Secondary | ICD-10-CM

## 2015-03-08 DIAGNOSIS — E119 Type 2 diabetes mellitus without complications: Secondary | ICD-10-CM

## 2015-03-08 DIAGNOSIS — E559 Vitamin D deficiency, unspecified: Secondary | ICD-10-CM

## 2015-03-08 DIAGNOSIS — D6489 Other specified anemias: Secondary | ICD-10-CM

## 2015-03-14 ENCOUNTER — Ambulatory Visit: Payer: Medicare Other | Admitting: Nurse Practitioner

## 2015-03-14 DIAGNOSIS — E119 Type 2 diabetes mellitus without complications: Secondary | ICD-10-CM | POA: Diagnosis not present

## 2015-03-14 DIAGNOSIS — I10 Essential (primary) hypertension: Secondary | ICD-10-CM | POA: Diagnosis not present

## 2015-03-14 DIAGNOSIS — D6489 Other specified anemias: Secondary | ICD-10-CM | POA: Diagnosis not present

## 2015-03-14 DIAGNOSIS — K912 Postsurgical malabsorption, not elsewhere classified: Secondary | ICD-10-CM | POA: Diagnosis not present

## 2015-03-14 DIAGNOSIS — K760 Fatty (change of) liver, not elsewhere classified: Secondary | ICD-10-CM | POA: Diagnosis not present

## 2015-03-14 DIAGNOSIS — R5383 Other fatigue: Secondary | ICD-10-CM | POA: Diagnosis not present

## 2015-03-14 DIAGNOSIS — E538 Deficiency of other specified B group vitamins: Secondary | ICD-10-CM | POA: Diagnosis not present

## 2015-03-15 DIAGNOSIS — S83281D Other tear of lateral meniscus, current injury, right knee, subsequent encounter: Secondary | ICD-10-CM | POA: Diagnosis not present

## 2015-03-15 DIAGNOSIS — S83241D Other tear of medial meniscus, current injury, right knee, subsequent encounter: Secondary | ICD-10-CM | POA: Diagnosis not present

## 2015-03-15 LAB — MICROALBUMIN / CREATININE URINE RATIO
CREATININE, UR: 193.8 mg/dL
MICROALB/CREAT RATIO: 6.4 mg/g creat (ref 0.0–30.0)
MICROALBUM., U, RANDOM: 12.4 ug/mL

## 2015-03-15 LAB — HEPATIC FUNCTION PANEL
ALBUMIN: 4.2 g/dL (ref 3.5–5.5)
ALK PHOS: 78 IU/L (ref 39–117)
ALT: 27 IU/L (ref 0–32)
AST: 19 IU/L (ref 0–40)
BILIRUBIN, DIRECT: 0.09 mg/dL (ref 0.00–0.40)
Bilirubin Total: 0.5 mg/dL (ref 0.0–1.2)
TOTAL PROTEIN: 6.6 g/dL (ref 6.0–8.5)

## 2015-03-15 LAB — BASIC METABOLIC PANEL
BUN/Creatinine Ratio: 14 (ref 8–20)
BUN: 12 mg/dL (ref 6–20)
CO2: 21 mmol/L (ref 18–29)
CREATININE: 0.87 mg/dL (ref 0.57–1.00)
Calcium: 9.2 mg/dL (ref 8.7–10.2)
Chloride: 98 mmol/L (ref 97–108)
GFR calc Af Amer: 101 mL/min/{1.73_m2} (ref >59)
GFR, EST NON AFRICAN AMERICAN: 88 mL/min/{1.73_m2} (ref >59)
Glucose: 98 mg/dL (ref 65–99)
Potassium: 4.4 mmol/L (ref 3.5–5.2)
Sodium: 138 mmol/L (ref 134–144)

## 2015-03-15 LAB — LIPID PANEL
CHOL/HDL RATIO: 4.3 ratio (ref 0.0–4.4)
Cholesterol, Total: 256 mg/dL — ABNORMAL HIGH (ref 100–199)
HDL: 59 mg/dL (ref >39)
LDL Calculated: 167 mg/dL — ABNORMAL HIGH (ref 0–99)
Triglycerides: 152 mg/dL — ABNORMAL HIGH (ref 0–149)
VLDL Cholesterol Cal: 30 mg/dL (ref 5–40)

## 2015-03-15 LAB — CBC WITH DIFFERENTIAL/PLATELET
Basophils Absolute: 0.1 10*3/uL (ref 0.0–0.2)
Basos: 1 %
EOS (ABSOLUTE): 0.1 10*3/uL (ref 0.0–0.4)
EOS: 1 %
HEMATOCRIT: 44.5 % (ref 34.0–46.6)
Hemoglobin: 15.2 g/dL (ref 11.1–15.9)
IMMATURE GRANS (ABS): 0 10*3/uL (ref 0.0–0.1)
IMMATURE GRANULOCYTES: 0 %
Lymphocytes Absolute: 1.9 10*3/uL (ref 0.7–3.1)
Lymphs: 20 %
MCH: 31.6 pg (ref 26.6–33.0)
MCHC: 34.2 g/dL (ref 31.5–35.7)
MCV: 93 fL (ref 79–97)
Monocytes Absolute: 0.5 10*3/uL (ref 0.1–0.9)
Monocytes: 6 %
NEUTROS ABS: 6.6 10*3/uL (ref 1.4–7.0)
NEUTROS PCT: 72 %
Platelets: 271 10*3/uL (ref 150–379)
RBC: 4.81 x10E6/uL (ref 3.77–5.28)
RDW: 13.5 % (ref 12.3–15.4)
WBC: 9.1 10*3/uL (ref 3.4–10.8)

## 2015-03-15 LAB — TSH: TSH: 2.98 u[IU]/mL (ref 0.450–4.500)

## 2015-03-15 LAB — VITAMIN B12: VITAMIN B 12: 1122 pg/mL — AB (ref 211–946)

## 2015-03-15 LAB — MAGNESIUM: Magnesium: 2 mg/dL (ref 1.6–2.3)

## 2015-03-15 LAB — HEMOGLOBIN A1C
Est. average glucose Bld gHb Est-mCnc: 120 mg/dL
Hgb A1c MFr Bld: 5.8 % — ABNORMAL HIGH (ref 4.8–5.6)

## 2015-03-17 DIAGNOSIS — S335XXA Sprain of ligaments of lumbar spine, initial encounter: Secondary | ICD-10-CM | POA: Diagnosis not present

## 2015-03-17 DIAGNOSIS — S134XXA Sprain of ligaments of cervical spine, initial encounter: Secondary | ICD-10-CM | POA: Diagnosis not present

## 2015-03-17 DIAGNOSIS — M9902 Segmental and somatic dysfunction of thoracic region: Secondary | ICD-10-CM | POA: Diagnosis not present

## 2015-03-17 DIAGNOSIS — M9901 Segmental and somatic dysfunction of cervical region: Secondary | ICD-10-CM | POA: Diagnosis not present

## 2015-03-17 DIAGNOSIS — M546 Pain in thoracic spine: Secondary | ICD-10-CM | POA: Diagnosis not present

## 2015-03-17 DIAGNOSIS — M9903 Segmental and somatic dysfunction of lumbar region: Secondary | ICD-10-CM | POA: Diagnosis not present

## 2015-03-22 DIAGNOSIS — F4310 Post-traumatic stress disorder, unspecified: Secondary | ICD-10-CM | POA: Diagnosis not present

## 2015-03-22 DIAGNOSIS — Z452 Encounter for adjustment and management of vascular access device: Secondary | ICD-10-CM | POA: Diagnosis not present

## 2015-03-23 ENCOUNTER — Ambulatory Visit (INDEPENDENT_AMBULATORY_CARE_PROVIDER_SITE_OTHER): Payer: Medicare Other | Admitting: Nurse Practitioner

## 2015-03-23 ENCOUNTER — Encounter: Payer: Self-pay | Admitting: Nurse Practitioner

## 2015-03-23 VITALS — BP 124/80 | Ht 64.0 in | Wt 194.0 lb

## 2015-03-23 DIAGNOSIS — E876 Hypokalemia: Secondary | ICD-10-CM

## 2015-03-23 DIAGNOSIS — E538 Deficiency of other specified B group vitamins: Secondary | ICD-10-CM

## 2015-03-23 DIAGNOSIS — I10 Essential (primary) hypertension: Secondary | ICD-10-CM | POA: Diagnosis not present

## 2015-03-23 DIAGNOSIS — K760 Fatty (change of) liver, not elsewhere classified: Secondary | ICD-10-CM

## 2015-03-23 DIAGNOSIS — E785 Hyperlipidemia, unspecified: Secondary | ICD-10-CM | POA: Diagnosis not present

## 2015-03-23 LAB — HM DIABETES EYE EXAM

## 2015-03-28 ENCOUNTER — Encounter: Payer: Self-pay | Admitting: Nurse Practitioner

## 2015-03-28 NOTE — Progress Notes (Signed)
Subjective:  Presents for routine follow-up and to review her most recent lab work. Gets regular preventive health physicals with her gynecologist. Continues follow-up with several specialist including psychiatry. No chest pain/ischemic type pain or shortness of breath.  Objective:   BP 124/80 mmHg  Ht 5\' 4"  (1.626 m)  Wt 194 lb (87.998 kg)  BMI 33.28 kg/m2 NAD. Alert, oriented. Lungs clear. Heart regular rate rhythm. Lower extremities no edema. Reviewed lab work from 03/14/2015.  Assessment:  Problem List Items Addressed This Visit      Cardiovascular and Mediastinum   Essential hypertension     Digestive   B12 deficiency   Fatty liver     Other   Hypokalemia   Hypomagnesemia    Other Visit Diagnoses    Hyperlipidemia    -  Primary    Relevant Orders    Lipid panel       Plan: Stop all B-12 supplements since level is above normal. This may cause slight tremors or neurologic symptoms. Restart daily Miramax for constipation. Also reviewed other measures. Patient to call back in 2 weeks if no improvement. Low-fat, low-cholesterol diet with repeat labs in 3 months. If cholesterol remains abnormal, will consider medication at that point. Return in about 4 months (around 07/24/2015).

## 2015-03-29 ENCOUNTER — Other Ambulatory Visit: Payer: Self-pay | Admitting: Nurse Practitioner

## 2015-03-29 ENCOUNTER — Ambulatory Visit (HOSPITAL_COMMUNITY)
Admission: RE | Admit: 2015-03-29 | Discharge: 2015-03-29 | Disposition: A | Discharge status: Home / Self Care | Payer: Medicare Other | Source: Ambulatory Visit | Attending: Nurse Practitioner | Admitting: Nurse Practitioner

## 2015-03-29 DIAGNOSIS — S134XXA Sprain of ligaments of cervical spine, initial encounter: Secondary | ICD-10-CM | POA: Diagnosis not present

## 2015-03-29 DIAGNOSIS — M9902 Segmental and somatic dysfunction of thoracic region: Secondary | ICD-10-CM | POA: Diagnosis not present

## 2015-03-29 DIAGNOSIS — K5900 Constipation, unspecified: Secondary | ICD-10-CM | POA: Diagnosis not present

## 2015-03-29 DIAGNOSIS — H04123 Dry eye syndrome of bilateral lacrimal glands: Secondary | ICD-10-CM | POA: Diagnosis not present

## 2015-03-29 DIAGNOSIS — M9903 Segmental and somatic dysfunction of lumbar region: Secondary | ICD-10-CM | POA: Diagnosis not present

## 2015-03-29 DIAGNOSIS — S335XXA Sprain of ligaments of lumbar spine, initial encounter: Secondary | ICD-10-CM | POA: Diagnosis not present

## 2015-03-29 DIAGNOSIS — M546 Pain in thoracic spine: Secondary | ICD-10-CM | POA: Diagnosis not present

## 2015-03-29 DIAGNOSIS — M9901 Segmental and somatic dysfunction of cervical region: Secondary | ICD-10-CM | POA: Diagnosis not present

## 2015-03-29 NOTE — Progress Notes (Unsigned)
Phone call from patient. Continues to have constipation. See previous note. Mild discomfort. Only slight results after enema, Miralax, Dulcolax and magnesium citrate. Will order xray with further instructions following results.

## 2015-03-30 DIAGNOSIS — K5909 Other constipation: Secondary | ICD-10-CM | POA: Diagnosis not present

## 2015-03-30 DIAGNOSIS — K219 Gastro-esophageal reflux disease without esophagitis: Secondary | ICD-10-CM | POA: Diagnosis not present

## 2015-04-05 DIAGNOSIS — M546 Pain in thoracic spine: Secondary | ICD-10-CM | POA: Diagnosis not present

## 2015-04-05 DIAGNOSIS — S134XXA Sprain of ligaments of cervical spine, initial encounter: Secondary | ICD-10-CM | POA: Diagnosis not present

## 2015-04-05 DIAGNOSIS — S335XXA Sprain of ligaments of lumbar spine, initial encounter: Secondary | ICD-10-CM | POA: Diagnosis not present

## 2015-04-05 DIAGNOSIS — S83241D Other tear of medial meniscus, current injury, right knee, subsequent encounter: Secondary | ICD-10-CM | POA: Diagnosis not present

## 2015-04-05 DIAGNOSIS — M9901 Segmental and somatic dysfunction of cervical region: Secondary | ICD-10-CM | POA: Diagnosis not present

## 2015-04-05 DIAGNOSIS — M9903 Segmental and somatic dysfunction of lumbar region: Secondary | ICD-10-CM | POA: Diagnosis not present

## 2015-04-05 DIAGNOSIS — M9902 Segmental and somatic dysfunction of thoracic region: Secondary | ICD-10-CM | POA: Diagnosis not present

## 2015-04-12 DIAGNOSIS — M9903 Segmental and somatic dysfunction of lumbar region: Secondary | ICD-10-CM | POA: Diagnosis not present

## 2015-04-12 DIAGNOSIS — M546 Pain in thoracic spine: Secondary | ICD-10-CM | POA: Diagnosis not present

## 2015-04-12 DIAGNOSIS — S335XXA Sprain of ligaments of lumbar spine, initial encounter: Secondary | ICD-10-CM | POA: Diagnosis not present

## 2015-04-12 DIAGNOSIS — M9901 Segmental and somatic dysfunction of cervical region: Secondary | ICD-10-CM | POA: Diagnosis not present

## 2015-04-12 DIAGNOSIS — M9902 Segmental and somatic dysfunction of thoracic region: Secondary | ICD-10-CM | POA: Diagnosis not present

## 2015-04-12 DIAGNOSIS — S134XXA Sprain of ligaments of cervical spine, initial encounter: Secondary | ICD-10-CM | POA: Diagnosis not present

## 2015-04-18 ENCOUNTER — Encounter: Payer: Self-pay | Admitting: Family Medicine

## 2015-04-18 ENCOUNTER — Ambulatory Visit (INDEPENDENT_AMBULATORY_CARE_PROVIDER_SITE_OTHER): Payer: Medicare Other | Admitting: Family Medicine

## 2015-04-18 VITALS — BP 104/80 | Temp 98.2°F | Ht 64.0 in | Wt 192.1 lb

## 2015-04-18 DIAGNOSIS — N3000 Acute cystitis without hematuria: Secondary | ICD-10-CM | POA: Diagnosis not present

## 2015-04-18 DIAGNOSIS — R300 Dysuria: Secondary | ICD-10-CM | POA: Diagnosis not present

## 2015-04-18 LAB — POCT URINALYSIS DIPSTICK
Spec Grav, UA: >=1.03
pH, UA: 5

## 2015-04-18 MED ORDER — CIPROFLOXACIN HCL 250 MG PO TABS: 250.0000 mg | ORAL_TABLET | Freq: Two times a day (BID) | ORAL | Status: DC

## 2015-04-18 NOTE — Progress Notes (Signed)
Subjective:    Patient ID: [name], female    DOB: 04-15-1981, 34 y.o.   MRN: [id number]  Urinary Tract Infection  This is a new problem. The current episode started in the past 7 days. The problem has been unchanged. The quality of the pain is described as burning. The pain is moderate. There has been no fever. Associated symptoms include frequency. Pertinent negatives include no nausea. Associated symptoms comments: Back pain . She has tried nothing for the symptoms. The treatment provided no relief.   Patient states that she has no other concerns at this time.  No fever no vomiting no flank pain no severe abdominal pain  Review of Systems  HENT: Negative for congestion.   Respiratory: Negative for cough.   Gastrointestinal: Negative for nausea, abdominal pain and diarrhea.  Genitourinary: Positive for dysuria and frequency.       Objective:   Physical Exam  Constitutional: She appears well-developed and well-nourished.  HENT:  Head: Normocephalic.  Cardiovascular: Normal rate and normal heart sounds.   Pulmonary/Chest: Effort normal and breath sounds normal. She has no wheezes.  Abdominal: Soft. She exhibits no distension. There is no tenderness.   UA with occasional WBC occasional Crystal otherwise normal       Assessment & Plan:  UTI-culture sent, antibiotics prescribed, warning signs discussed, follow-up ongoing troubles I doubt kidney stone currently

## 2015-04-20 LAB — URINE CULTURE: Organism ID, Bacteria: NO GROWTH

## 2015-04-25 DIAGNOSIS — M9902 Segmental and somatic dysfunction of thoracic region: Secondary | ICD-10-CM | POA: Diagnosis not present

## 2015-04-25 DIAGNOSIS — M546 Pain in thoracic spine: Secondary | ICD-10-CM | POA: Diagnosis not present

## 2015-04-25 DIAGNOSIS — M9901 Segmental and somatic dysfunction of cervical region: Secondary | ICD-10-CM | POA: Diagnosis not present

## 2015-04-25 DIAGNOSIS — S335XXA Sprain of ligaments of lumbar spine, initial encounter: Secondary | ICD-10-CM | POA: Diagnosis not present

## 2015-04-25 DIAGNOSIS — S134XXA Sprain of ligaments of cervical spine, initial encounter: Secondary | ICD-10-CM | POA: Diagnosis not present

## 2015-04-25 DIAGNOSIS — M9903 Segmental and somatic dysfunction of lumbar region: Secondary | ICD-10-CM | POA: Diagnosis not present

## 2015-04-27 DIAGNOSIS — Z9884 Bariatric surgery status: Secondary | ICD-10-CM | POA: Diagnosis not present

## 2015-04-27 DIAGNOSIS — K210 Gastro-esophageal reflux disease with esophagitis: Secondary | ICD-10-CM | POA: Diagnosis not present

## 2015-05-02 ENCOUNTER — Telehealth: Payer: Self-pay | Admitting: Family Medicine

## 2015-05-02 ENCOUNTER — Other Ambulatory Visit: Payer: Self-pay | Admitting: *Deleted

## 2015-05-02 MED ORDER — ALBUTEROL SULFATE (2.5 MG/3ML) 0.083% IN NEBU: INHALATION_SOLUTION | RESPIRATORY_TRACT | Status: DC

## 2015-05-02 MED ORDER — ALBUTEROL SULFATE HFA 108 (90 BASE) MCG/ACT IN AERS: 2.0000 | INHALATION_SPRAY | RESPIRATORY_TRACT | Status: DC | PRN

## 2015-05-02 NOTE — Telephone Encounter (Signed)
meds sent to pharm. Pt notified.  

## 2015-05-02 NOTE — Telephone Encounter (Signed)
Pt is needing a refill on her albuterol solution for her nebulizer and her pro air inhaler sent to [facility]

## 2015-05-04 ENCOUNTER — Ambulatory Visit (INDEPENDENT_AMBULATORY_CARE_PROVIDER_SITE_OTHER): Payer: Medicare Other | Admitting: Women's Health

## 2015-05-04 ENCOUNTER — Encounter: Payer: Self-pay | Admitting: Women's Health

## 2015-05-04 VITALS — BP 120/88 | HR 72 | Wt 191.0 lb

## 2015-05-04 DIAGNOSIS — Z113 Encounter for screening for infections with a predominantly sexual mode of transmission: Secondary | ICD-10-CM

## 2015-05-04 LAB — POCT WET PREP (WET MOUNT): CLUE CELLS WET PREP WHIFF POC: NEGATIVE
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
## 2015-05-05 LAB — GC/CHLAMYDIA PROBE AMP
CHLAMYDIA, DNA PROBE: NEGATIVE
Neisseria gonorrhoeae by PCR: NEGATIVE

## 2015-05-05 LAB — RPR: RPR Ser Ql: NONREACTIVE

## 2015-05-05 LAB — HIV ANTIBODY (ROUTINE TESTING W REFLEX): HIV SCREEN 4TH GENERATION: NONREACTIVE

## 2015-05-05 LAB — HEPATITIS B SURFACE ANTIGEN: HEP B S AG: NEGATIVE

## 2015-05-06 ENCOUNTER — Telehealth: Payer: Self-pay | Admitting: Obstetrics and Gynecology

## 2015-05-06 NOTE — Telephone Encounter (Signed)
Pt informed of negative results to STD screening from 05/04/2015.

## 2015-05-09 ENCOUNTER — Telehealth: Payer: Self-pay | Admitting: Family Medicine

## 2015-05-09 MED ORDER — ALBUTEROL SULFATE HFA 108 (90 BASE) MCG/ACT IN AERS: 2.0000 | INHALATION_SPRAY | RESPIRATORY_TRACT | Status: DC | PRN

## 2015-05-09 NOTE — Telephone Encounter (Signed)
Done

## 2015-05-09 NOTE — Telephone Encounter (Signed)
Pt's albuterol (PROVENTIL HFA;VENTOLIN HFA) 108 (90 BASE) MCG/ACT inhaler is requiring Prior Auth for Proventil  VENTOLIN is listed on formulary (see list and form in green folder)  If agree to change, please send in order specifically for VENTOLIN to [employer]

## 2015-05-09 NOTE — Telephone Encounter (Signed)
Ventolin fine prn refill

## 2015-05-10 DIAGNOSIS — M9901 Segmental and somatic dysfunction of cervical region: Secondary | ICD-10-CM | POA: Diagnosis not present

## 2015-05-10 DIAGNOSIS — S134XXA Sprain of ligaments of cervical spine, initial encounter: Secondary | ICD-10-CM | POA: Diagnosis not present

## 2015-05-10 DIAGNOSIS — M9902 Segmental and somatic dysfunction of thoracic region: Secondary | ICD-10-CM | POA: Diagnosis not present

## 2015-05-10 DIAGNOSIS — M546 Pain in thoracic spine: Secondary | ICD-10-CM | POA: Diagnosis not present

## 2015-05-10 DIAGNOSIS — S335XXA Sprain of ligaments of lumbar spine, initial encounter: Secondary | ICD-10-CM | POA: Diagnosis not present

## 2015-05-10 DIAGNOSIS — M9903 Segmental and somatic dysfunction of lumbar region: Secondary | ICD-10-CM | POA: Diagnosis not present

## 2015-05-12 ENCOUNTER — Ambulatory Visit: Payer: Medicare Other | Admitting: Nurse Practitioner

## 2015-05-12 DIAGNOSIS — K5900 Constipation, unspecified: Secondary | ICD-10-CM | POA: Diagnosis not present

## 2015-05-12 DIAGNOSIS — Z7901 Long term (current) use of anticoagulants: Secondary | ICD-10-CM | POA: Diagnosis not present

## 2015-05-12 DIAGNOSIS — Z881 Allergy status to other antibiotic agents status: Secondary | ICD-10-CM | POA: Diagnosis not present

## 2015-05-12 DIAGNOSIS — I10 Essential (primary) hypertension: Secondary | ICD-10-CM | POA: Diagnosis not present

## 2015-05-12 DIAGNOSIS — Z79899 Other long term (current) drug therapy: Secondary | ICD-10-CM | POA: Diagnosis not present

## 2015-05-12 DIAGNOSIS — L819 Disorder of pigmentation, unspecified: Secondary | ICD-10-CM | POA: Diagnosis not present

## 2015-05-12 DIAGNOSIS — Z885 Allergy status to narcotic agent status: Secondary | ICD-10-CM | POA: Diagnosis not present

## 2015-05-12 DIAGNOSIS — I82621 Acute embolism and thrombosis of deep veins of right upper extremity: Secondary | ICD-10-CM | POA: Diagnosis not present

## 2015-05-12 DIAGNOSIS — E611 Iron deficiency: Secondary | ICD-10-CM | POA: Diagnosis not present

## 2015-05-12 DIAGNOSIS — D508 Other iron deficiency anemias: Secondary | ICD-10-CM | POA: Diagnosis not present

## 2015-05-12 DIAGNOSIS — I8290 Acute embolism and thrombosis of unspecified vein: Secondary | ICD-10-CM | POA: Diagnosis not present

## 2015-05-12 DIAGNOSIS — I82629 Acute embolism and thrombosis of deep veins of unspecified upper extremity: Secondary | ICD-10-CM | POA: Diagnosis not present

## 2015-05-12 DIAGNOSIS — D649 Anemia, unspecified: Secondary | ICD-10-CM | POA: Diagnosis not present

## 2015-05-12 DIAGNOSIS — F4310 Post-traumatic stress disorder, unspecified: Secondary | ICD-10-CM | POA: Diagnosis not present

## 2015-05-12 DIAGNOSIS — Z7984 Long term (current) use of oral hypoglycemic drugs: Secondary | ICD-10-CM | POA: Diagnosis not present

## 2015-05-12 DIAGNOSIS — E119 Type 2 diabetes mellitus without complications: Secondary | ICD-10-CM | POA: Diagnosis not present

## 2015-05-12 DIAGNOSIS — Z888 Allergy status to other drugs, medicaments and biological substances status: Secondary | ICD-10-CM | POA: Diagnosis not present

## 2015-05-16 DIAGNOSIS — Z79899 Other long term (current) drug therapy: Secondary | ICD-10-CM | POA: Diagnosis not present

## 2015-05-16 DIAGNOSIS — F329 Major depressive disorder, single episode, unspecified: Secondary | ICD-10-CM | POA: Diagnosis not present

## 2015-05-16 DIAGNOSIS — F419 Anxiety disorder, unspecified: Secondary | ICD-10-CM | POA: Diagnosis not present

## 2015-05-16 DIAGNOSIS — K219 Gastro-esophageal reflux disease without esophagitis: Secondary | ICD-10-CM | POA: Diagnosis not present

## 2015-05-16 DIAGNOSIS — R12 Heartburn: Secondary | ICD-10-CM | POA: Diagnosis not present

## 2015-05-16 DIAGNOSIS — Z86718 Personal history of other venous thrombosis and embolism: Secondary | ICD-10-CM | POA: Diagnosis not present

## 2015-05-16 DIAGNOSIS — K2950 Unspecified chronic gastritis without bleeding: Secondary | ICD-10-CM | POA: Diagnosis not present

## 2015-05-16 DIAGNOSIS — K3189 Other diseases of stomach and duodenum: Secondary | ICD-10-CM | POA: Diagnosis not present

## 2015-05-18 ENCOUNTER — Ambulatory Visit (INDEPENDENT_AMBULATORY_CARE_PROVIDER_SITE_OTHER): Payer: Medicare Other | Admitting: Nurse Practitioner

## 2015-05-18 ENCOUNTER — Encounter: Payer: Self-pay | Admitting: Nurse Practitioner

## 2015-05-18 VITALS — BP 128/82 | Temp 97.9°F | Ht 64.0 in | Wt 197.0 lb

## 2015-05-18 DIAGNOSIS — K5904 Chronic idiopathic constipation: Secondary | ICD-10-CM | POA: Diagnosis not present

## 2015-05-18 DIAGNOSIS — J329 Chronic sinusitis, unspecified: Secondary | ICD-10-CM | POA: Diagnosis not present

## 2015-05-18 MED ORDER — AZITHROMYCIN 250 MG PO TABS: ORAL_TABLET | ORAL | Status: DC

## 2015-05-18 MED ORDER — LINACLOTIDE 145 MCG PO CAPS: 145.0000 ug | ORAL_CAPSULE | Freq: Every day | ORAL | Status: DC

## 2015-05-20 ENCOUNTER — Encounter: Payer: Self-pay | Admitting: Nurse Practitioner

## 2015-05-20 ENCOUNTER — Telehealth: Payer: Self-pay | Admitting: Family Medicine

## 2015-05-20 DIAGNOSIS — K5904 Chronic idiopathic constipation: Secondary | ICD-10-CM | POA: Insufficient documentation

## 2015-05-20 MED ORDER — HYDROCODONE-HOMATROPINE 5-1.5 MG/5ML PO SYRP: ORAL_SOLUTION | ORAL | Status: DC

## 2015-05-20 MED ORDER — LEVOFLOXACIN 500 MG PO TABS: ORAL_TABLET | ORAL | Status: DC

## 2015-05-20 NOTE — Telephone Encounter (Signed)
Lev 500 qd for ten d  Hycodan 3

## 2015-05-20 NOTE — Progress Notes (Signed)
Subjective:  Resents with complaints of coughing and congestion for the past 2 weeks. No fever. Sore throat. Frontal area headache. Runny nose. Frequent cough producing green mucus. Frequent wheezing, using either her albuterol inhaler or nebulizer every 4 hours.Is undergoing a GI study, has had a flareup of her reflux since she stopped her m during this time, will be restarting her medicine today. Has had significant constipation lately, has tried multiple OTC products per GI recommendation. Minimal relief.  Objective:   BP 128/82 mmHg  Temp(Src) 97.9 F (36.6 C) (Oral)  Ht 5\' 4"  (1.626 m)  Wt 197 lb (89.359 kg)  BMI 33.80 kg/m2 NAD. Alert, oriented. TMs clear effusion, no erythema. Pharynx mildly erythematous with PND noted. Neck supple with mild soft anterior adenopathy. Lungs clear. Heart regular rate rhythm.  Assessment:  Problem List Items Addressed This Visit      Digestive   Chronic idiopathic constipation    Other Visit Diagnoses    Rhinosinusitis    -  Primary    Relevant Medications    azithromycin (ZITHROMAX Z-PAK) 250 MG tablet      Plan:  Meds ordered this encounter  Medications  . azithromycin (ZITHROMAX Z-PAK) 250 MG tablet    Sig: Take 2 tablets (500 mg) on  Day 1,  followed by 1 tablet (250 mg) once daily on Days 2 through 5.    Dispense:  6 each    Refill:  0    Order Specific Question:  Supervising Provider    Answer:  Mikey Kirschner [[id number]]  . Linaclotide (LINZESS) 145 MCG CAPS capsule    Sig: Take 1 capsule (145 mcg total) by mouth daily. About 30 min before first meal    Dispense:  30 capsule    Refill:  2    Please dispense name brand per medicaid formulary    Order Specific Question:  Supervising Provider    Answer:  Mikey Kirschner [[id number]]   OTC meds as directed for congestion and cough.discussed measures to help with her constipation. Start linzess as directed. Warning signs reviewed. Call back if any of her symptoms worsen or persist.

## 2015-05-20 NOTE — Telephone Encounter (Signed)
Called patient and informed her that per Dr.Steve Luking- Levaquin to take 1 tablet once a day for 10 days, and hycodan cough syrup to take 1 teaspoon at bedtime was ordered. Informed patient that she must pick up hard copy of hycodan. Patient verbalized understanding.

## 2015-05-20 NOTE — Telephone Encounter (Signed)
Pt called stating that the zpac that she was given is not working and wants to know if something else can be called in along with some cough syrup.    [facility]

## 2015-05-20 NOTE — Telephone Encounter (Signed)
Patient called about this hoping to get this before the weekend.

## 2015-05-24 DIAGNOSIS — M9902 Segmental and somatic dysfunction of thoracic region: Secondary | ICD-10-CM | POA: Diagnosis not present

## 2015-05-24 DIAGNOSIS — M9901 Segmental and somatic dysfunction of cervical region: Secondary | ICD-10-CM | POA: Diagnosis not present

## 2015-05-24 DIAGNOSIS — M9903 Segmental and somatic dysfunction of lumbar region: Secondary | ICD-10-CM | POA: Diagnosis not present

## 2015-05-24 DIAGNOSIS — S335XXA Sprain of ligaments of lumbar spine, initial encounter: Secondary | ICD-10-CM | POA: Diagnosis not present

## 2015-05-24 DIAGNOSIS — M546 Pain in thoracic spine: Secondary | ICD-10-CM | POA: Diagnosis not present

## 2015-05-24 DIAGNOSIS — S134XXA Sprain of ligaments of cervical spine, initial encounter: Secondary | ICD-10-CM | POA: Diagnosis not present

## 2015-05-25 DIAGNOSIS — K219 Gastro-esophageal reflux disease without esophagitis: Secondary | ICD-10-CM | POA: Diagnosis not present

## 2015-05-25 DIAGNOSIS — E860 Dehydration: Secondary | ICD-10-CM | POA: Diagnosis not present

## 2015-05-25 DIAGNOSIS — K210 Gastro-esophageal reflux disease with esophagitis: Secondary | ICD-10-CM | POA: Diagnosis not present

## 2015-05-28 ENCOUNTER — Other Ambulatory Visit: Payer: Self-pay | Admitting: Nurse Practitioner

## 2015-06-07 DIAGNOSIS — M546 Pain in thoracic spine: Secondary | ICD-10-CM | POA: Diagnosis not present

## 2015-06-07 DIAGNOSIS — M9901 Segmental and somatic dysfunction of cervical region: Secondary | ICD-10-CM | POA: Diagnosis not present

## 2015-06-07 DIAGNOSIS — M9903 Segmental and somatic dysfunction of lumbar region: Secondary | ICD-10-CM | POA: Diagnosis not present

## 2015-06-07 DIAGNOSIS — M9902 Segmental and somatic dysfunction of thoracic region: Secondary | ICD-10-CM | POA: Diagnosis not present

## 2015-06-07 DIAGNOSIS — S335XXA Sprain of ligaments of lumbar spine, initial encounter: Secondary | ICD-10-CM | POA: Diagnosis not present

## 2015-06-07 DIAGNOSIS — S134XXA Sprain of ligaments of cervical spine, initial encounter: Secondary | ICD-10-CM | POA: Diagnosis not present

## 2015-06-08 DIAGNOSIS — K912 Postsurgical malabsorption, not elsewhere classified: Secondary | ICD-10-CM | POA: Diagnosis not present

## 2015-06-08 DIAGNOSIS — Z9884 Bariatric surgery status: Secondary | ICD-10-CM | POA: Diagnosis not present

## 2015-06-08 DIAGNOSIS — Z713 Dietary counseling and surveillance: Secondary | ICD-10-CM | POA: Diagnosis not present

## 2015-06-15 ENCOUNTER — Telehealth: Payer: Self-pay | Admitting: Family Medicine

## 2015-06-15 DIAGNOSIS — Z13220 Encounter for screening for lipoid disorders: Secondary | ICD-10-CM

## 2015-06-15 NOTE — Telephone Encounter (Signed)
Pt is requesting lab orders to be sent over for her upcoming appt. Last labs per epic were bmp,cbc,a1c,hepatic,lipid,tsh,b12,microalbumin and magnesium on 03/14/15

## 2015-06-15 NOTE — Telephone Encounter (Signed)
[name] wanted fasting lipid panel, since due to see soon, rec fasting lip profile one week before visit with carolyn and carolyn will disc then

## 2015-06-16 NOTE — Telephone Encounter (Signed)
Called patient and informed her per Dr.Steve Luking- Labs ordered needs to be fasting. Patient verbalized understanding.

## 2015-06-27 DIAGNOSIS — F322 Major depressive disorder, single episode, severe without psychotic features: Secondary | ICD-10-CM | POA: Diagnosis not present

## 2015-06-27 DIAGNOSIS — F4310 Post-traumatic stress disorder, unspecified: Secondary | ICD-10-CM | POA: Diagnosis not present

## 2015-06-27 DIAGNOSIS — Z452 Encounter for adjustment and management of vascular access device: Secondary | ICD-10-CM | POA: Diagnosis not present

## 2015-06-30 ENCOUNTER — Ambulatory Visit (INDEPENDENT_AMBULATORY_CARE_PROVIDER_SITE_OTHER): Payer: Medicare Other | Admitting: Psychiatry

## 2015-06-30 ENCOUNTER — Encounter (HOSPITAL_COMMUNITY): Payer: Self-pay | Admitting: Psychiatry

## 2015-06-30 DIAGNOSIS — S335XXA Sprain of ligaments of lumbar spine, initial encounter: Secondary | ICD-10-CM | POA: Diagnosis not present

## 2015-06-30 DIAGNOSIS — F339 Major depressive disorder, recurrent, unspecified: Secondary | ICD-10-CM

## 2015-06-30 DIAGNOSIS — M9903 Segmental and somatic dysfunction of lumbar region: Secondary | ICD-10-CM | POA: Diagnosis not present

## 2015-06-30 DIAGNOSIS — M546 Pain in thoracic spine: Secondary | ICD-10-CM | POA: Diagnosis not present

## 2015-06-30 DIAGNOSIS — S134XXA Sprain of ligaments of cervical spine, initial encounter: Secondary | ICD-10-CM | POA: Diagnosis not present

## 2015-06-30 DIAGNOSIS — M9902 Segmental and somatic dysfunction of thoracic region: Secondary | ICD-10-CM | POA: Diagnosis not present

## 2015-06-30 DIAGNOSIS — M9901 Segmental and somatic dysfunction of cervical region: Secondary | ICD-10-CM | POA: Diagnosis not present

## 2015-06-30 NOTE — Patient Instructions (Signed)
Discussed orally 
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
## 2015-07-04 ENCOUNTER — Encounter: Payer: Self-pay | Admitting: Nurse Practitioner

## 2015-07-07 DIAGNOSIS — Z7984 Long term (current) use of oral hypoglycemic drugs: Secondary | ICD-10-CM | POA: Diagnosis not present

## 2015-07-07 DIAGNOSIS — Z86718 Personal history of other venous thrombosis and embolism: Secondary | ICD-10-CM | POA: Diagnosis not present

## 2015-07-07 DIAGNOSIS — F329 Major depressive disorder, single episode, unspecified: Secondary | ICD-10-CM | POA: Diagnosis not present

## 2015-07-07 DIAGNOSIS — F429 Obsessive-compulsive disorder, unspecified: Secondary | ICD-10-CM | POA: Diagnosis not present

## 2015-07-07 DIAGNOSIS — K219 Gastro-esophageal reflux disease without esophagitis: Secondary | ICD-10-CM | POA: Diagnosis not present

## 2015-07-07 DIAGNOSIS — R1084 Generalized abdominal pain: Secondary | ICD-10-CM | POA: Diagnosis not present

## 2015-07-07 DIAGNOSIS — E1122 Type 2 diabetes mellitus with diabetic chronic kidney disease: Secondary | ICD-10-CM | POA: Diagnosis not present

## 2015-07-07 DIAGNOSIS — Z9884 Bariatric surgery status: Secondary | ICD-10-CM | POA: Diagnosis not present

## 2015-07-07 DIAGNOSIS — Z6833 Body mass index (BMI) 33.0-33.9, adult: Secondary | ICD-10-CM | POA: Diagnosis not present

## 2015-07-07 DIAGNOSIS — Z91048 Other nonmedicinal substance allergy status: Secondary | ICD-10-CM | POA: Diagnosis not present

## 2015-07-07 DIAGNOSIS — M94261 Chondromalacia, right knee: Secondary | ICD-10-CM | POA: Diagnosis not present

## 2015-07-07 DIAGNOSIS — I129 Hypertensive chronic kidney disease with stage 1 through stage 4 chronic kidney disease, or unspecified chronic kidney disease: Secondary | ICD-10-CM | POA: Diagnosis not present

## 2015-07-07 DIAGNOSIS — G4700 Insomnia, unspecified: Secondary | ICD-10-CM | POA: Diagnosis not present

## 2015-07-07 DIAGNOSIS — Z881 Allergy status to other antibiotic agents status: Secondary | ICD-10-CM | POA: Diagnosis not present

## 2015-07-07 DIAGNOSIS — Z79899 Other long term (current) drug therapy: Secondary | ICD-10-CM | POA: Diagnosis not present

## 2015-07-07 DIAGNOSIS — Z888 Allergy status to other drugs, medicaments and biological substances status: Secondary | ICD-10-CM | POA: Diagnosis not present

## 2015-07-07 DIAGNOSIS — G4733 Obstructive sleep apnea (adult) (pediatric): Secondary | ICD-10-CM | POA: Diagnosis not present

## 2015-07-07 DIAGNOSIS — G894 Chronic pain syndrome: Secondary | ICD-10-CM | POA: Diagnosis not present

## 2015-07-07 DIAGNOSIS — Z7901 Long term (current) use of anticoagulants: Secondary | ICD-10-CM | POA: Diagnosis not present

## 2015-07-07 DIAGNOSIS — F988 Other specified behavioral and emotional disorders with onset usually occurring in childhood and adolescence: Secondary | ICD-10-CM | POA: Diagnosis not present

## 2015-07-07 DIAGNOSIS — E282 Polycystic ovarian syndrome: Secondary | ICD-10-CM | POA: Diagnosis not present

## 2015-07-07 DIAGNOSIS — F419 Anxiety disorder, unspecified: Secondary | ICD-10-CM | POA: Diagnosis not present

## 2015-07-07 DIAGNOSIS — G4730 Sleep apnea, unspecified: Secondary | ICD-10-CM | POA: Diagnosis not present

## 2015-07-12 ENCOUNTER — Other Ambulatory Visit: Payer: Self-pay | Admitting: Nurse Practitioner

## 2015-07-13 ENCOUNTER — Encounter (HOSPITAL_COMMUNITY): Payer: Self-pay | Admitting: Psychiatry

## 2015-07-13 ENCOUNTER — Ambulatory Visit (INDEPENDENT_AMBULATORY_CARE_PROVIDER_SITE_OTHER): Payer: Medicare Other | Admitting: Psychiatry

## 2015-07-13 DIAGNOSIS — F339 Major depressive disorder, recurrent, unspecified: Secondary | ICD-10-CM

## 2015-07-13 DIAGNOSIS — S335XXA Sprain of ligaments of lumbar spine, initial encounter: Secondary | ICD-10-CM | POA: Diagnosis not present

## 2015-07-13 DIAGNOSIS — M546 Pain in thoracic spine: Secondary | ICD-10-CM | POA: Diagnosis not present

## 2015-07-13 DIAGNOSIS — S134XXA Sprain of ligaments of cervical spine, initial encounter: Secondary | ICD-10-CM | POA: Diagnosis not present

## 2015-07-13 DIAGNOSIS — M9902 Segmental and somatic dysfunction of thoracic region: Secondary | ICD-10-CM | POA: Diagnosis not present

## 2015-07-13 DIAGNOSIS — M9903 Segmental and somatic dysfunction of lumbar region: Secondary | ICD-10-CM | POA: Diagnosis not present

## 2015-07-13 DIAGNOSIS — M9901 Segmental and somatic dysfunction of cervical region: Secondary | ICD-10-CM | POA: Diagnosis not present
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
## 2015-07-13 NOTE — Patient Instructions (Signed)
Discussed orally 

## 2015-07-14 DIAGNOSIS — Z13220 Encounter for screening for lipoid disorders: Secondary | ICD-10-CM | POA: Diagnosis not present

## 2015-07-15 LAB — LIPID PANEL
CHOL/HDL RATIO: 4 ratio (ref 0.0–4.4)
Cholesterol, Total: 242 mg/dL — ABNORMAL HIGH (ref 100–199)
HDL: 60 mg/dL (ref >39)
LDL CALC: 159 mg/dL — AB (ref 0–99)
TRIGLYCERIDES: 117 mg/dL (ref 0–149)
VLDL Cholesterol Cal: 23 mg/dL (ref 5–40)

## 2015-07-19 ENCOUNTER — Encounter: Payer: Self-pay | Admitting: Family Medicine

## 2015-07-19 ENCOUNTER — Ambulatory Visit (INDEPENDENT_AMBULATORY_CARE_PROVIDER_SITE_OTHER): Payer: Medicare Other | Admitting: Family Medicine

## 2015-07-19 VITALS — BP 110/82 | Temp 98.7°F | Ht 64.0 in | Wt 190.2 lb

## 2015-07-19 DIAGNOSIS — B9689 Other specified bacterial agents as the cause of diseases classified elsewhere: Secondary | ICD-10-CM

## 2015-07-19 DIAGNOSIS — J352 Hypertrophy of adenoids: Secondary | ICD-10-CM

## 2015-07-19 DIAGNOSIS — B349 Viral infection, unspecified: Secondary | ICD-10-CM | POA: Diagnosis not present

## 2015-07-19 DIAGNOSIS — J019 Acute sinusitis, unspecified: Secondary | ICD-10-CM

## 2015-07-19 MED ORDER — LEVOFLOXACIN 500 MG PO TABS: ORAL_TABLET | ORAL | Status: DC

## 2015-07-19 NOTE — Progress Notes (Signed)
Subjective:    Patient ID: [name], female    DOB: 1981/05/05, 35 y.o.   MRN: [id number]  Sinusitis This is a new problem. The current episode started in the past 7 days. The problem has been gradually worsening since onset. There has been no fever. Associated symptoms include coughing, ear pain, headaches and a sore throat. (Fatigue, Runny nose)   PMH see problem list Patient states no other concerns this visit.  Review of Systems  HENT: Positive for ear pain and sore throat.   Respiratory: Positive for cough.   Neurological: Positive for headaches.    relates sinus pressure pain discomfort    Objective:   Physical Exam  Constitutional: She appears well-developed.  HENT:  Head: Normocephalic.  Nose: Nose normal.  Mouth/Throat: Oropharynx is clear and moist. No oropharyngeal exudate.  Neck: Neck supple.  Cardiovascular: Normal rate and normal heart sounds.   No murmur heard. Pulmonary/Chest: Effort normal and breath sounds normal. She has no wheezes.  Lymphadenopathy:    She has no cervical adenopathy.  Skin: Skin is warm and dry.  Nursing note and vitals reviewed.    patient does not appear toxic      Assessment & Plan:   viral illness Secondary rhinosinusitis Antibiotics prescribed warning signs discussed  Follow-up if progressive troubles

## 2015-07-20 ENCOUNTER — Ambulatory Visit (HOSPITAL_COMMUNITY): Payer: Self-pay | Admitting: Psychiatry

## 2015-07-21 ENCOUNTER — Encounter: Payer: Self-pay | Admitting: Family Medicine

## 2015-07-25 ENCOUNTER — Ambulatory Visit (INDEPENDENT_AMBULATORY_CARE_PROVIDER_SITE_OTHER): Payer: Medicare Other | Admitting: Psychiatry

## 2015-07-25 ENCOUNTER — Ambulatory Visit: Payer: Medicare Other | Admitting: Nurse Practitioner

## 2015-07-25 ENCOUNTER — Encounter (HOSPITAL_COMMUNITY): Payer: Self-pay | Admitting: Psychiatry

## 2015-07-25 DIAGNOSIS — F339 Major depressive disorder, recurrent, unspecified: Secondary | ICD-10-CM | POA: Diagnosis not present
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
## 2015-07-25 NOTE — Patient Instructions (Signed)
Discussed orally 

## 2015-07-26 DIAGNOSIS — M9901 Segmental and somatic dysfunction of cervical region: Secondary | ICD-10-CM | POA: Diagnosis not present

## 2015-07-26 DIAGNOSIS — M9903 Segmental and somatic dysfunction of lumbar region: Secondary | ICD-10-CM | POA: Diagnosis not present

## 2015-07-26 DIAGNOSIS — S335XXA Sprain of ligaments of lumbar spine, initial encounter: Secondary | ICD-10-CM | POA: Diagnosis not present

## 2015-07-26 DIAGNOSIS — S134XXA Sprain of ligaments of cervical spine, initial encounter: Secondary | ICD-10-CM | POA: Diagnosis not present

## 2015-07-26 DIAGNOSIS — M9902 Segmental and somatic dysfunction of thoracic region: Secondary | ICD-10-CM | POA: Diagnosis not present

## 2015-07-26 DIAGNOSIS — M546 Pain in thoracic spine: Secondary | ICD-10-CM | POA: Diagnosis not present

## 2015-07-27 ENCOUNTER — Ambulatory Visit (INDEPENDENT_AMBULATORY_CARE_PROVIDER_SITE_OTHER): Payer: Medicare Other | Admitting: Nurse Practitioner

## 2015-07-27 ENCOUNTER — Encounter: Payer: Self-pay | Admitting: Nurse Practitioner

## 2015-07-27 DIAGNOSIS — E785 Hyperlipidemia, unspecified: Secondary | ICD-10-CM

## 2015-07-27 DIAGNOSIS — R140 Abdominal distension (gaseous): Secondary | ICD-10-CM | POA: Diagnosis not present

## 2015-07-27 DIAGNOSIS — K210 Gastro-esophageal reflux disease with esophagitis: Secondary | ICD-10-CM | POA: Diagnosis not present

## 2015-07-27 DIAGNOSIS — K5900 Constipation, unspecified: Secondary | ICD-10-CM | POA: Diagnosis not present

## 2015-07-27 DIAGNOSIS — K219 Gastro-esophageal reflux disease without esophagitis: Secondary | ICD-10-CM | POA: Diagnosis not present

## 2015-07-27 DIAGNOSIS — Z7901 Long term (current) use of anticoagulants: Secondary | ICD-10-CM | POA: Diagnosis not present

## 2015-07-27 DIAGNOSIS — F322 Major depressive disorder, single episode, severe without psychotic features: Secondary | ICD-10-CM | POA: Diagnosis not present

## 2015-07-27 DIAGNOSIS — E119 Type 2 diabetes mellitus without complications: Secondary | ICD-10-CM | POA: Diagnosis not present

## 2015-07-27 DIAGNOSIS — F4310 Post-traumatic stress disorder, unspecified: Secondary | ICD-10-CM | POA: Diagnosis not present

## 2015-07-27 LAB — POCT GLYCOSYLATED HEMOGLOBIN (HGB A1C): Hemoglobin A1C: 5.3

## 2015-07-27 MED ORDER — PRAVASTATIN SODIUM 10 MG PO TABS: 10.0000 mg | ORAL_TABLET | Freq: Every day | ORAL | Status: DC

## 2015-07-27 NOTE — Progress Notes (Signed)
Subjective:  Presents for routine follow-up. Has lost 12 pounds over the past few months. Trying to increase her activity. Continues follow-up with psychiatry. Psychiatrist has decreased her Zyprexa dose due to potential effect on her cholesterol.  Objective:   BP 126/86 mmHg  Ht 5\' 4"  (1.626 m)  Wt 189 lb 6 oz (85.9 kg)  BMI 32.49 kg/m2 NAD. Alert, oriented. Lungs clear. Heart regular rate rhythm. Reviewed last lipid profile from 07/14/2015 with patient and given copy for psychiatry. LDL remains at 159. Results for orders placed or performed in visit on 07/27/15  POCT HgB A1C  Result Value Ref Range   Hemoglobin A1C 5.3      Assessment:  Problem List Items Addressed This Visit      Endocrine   Type II diabetes mellitus (HCC)   Relevant Medications   pravastatin (PRAVACHOL) 10 MG tablet   Other Relevant Orders   POCT HgB A1C (Completed)   Hepatic function panel    Other Visit Diagnoses    Hyperlipemia        Relevant Medications    pravastatin (PRAVACHOL) 10 MG tablet    Other Relevant Orders    Lipid panel    Hepatic function panel      Plan:  Meds ordered this encounter  Medications  . pravastatin (PRAVACHOL) 10 MG tablet    Sig: Take 1 tablet (10 mg total) by mouth daily.    Dispense:  30 tablet    Refill:  2    Order Specific Question:  Supervising Provider    Answer:  Mikey Kirschner [[id number]]   Because of her history of diabetes, start low dose pravastatin as directed. Note patient has been on cholesterol medicine in the past. Continue to work on weight and activity. Repeat lipid and liver profiles in 8-10 weeks. Return in about 4 months (around 11/24/2015) for recheck.

## 2015-08-01 ENCOUNTER — Encounter (HOSPITAL_COMMUNITY): Payer: Self-pay | Admitting: Psychiatry

## 2015-08-01 ENCOUNTER — Ambulatory Visit (INDEPENDENT_AMBULATORY_CARE_PROVIDER_SITE_OTHER): Payer: Medicare Other | Admitting: Psychiatry

## 2015-08-01 DIAGNOSIS — F339 Major depressive disorder, recurrent, unspecified: Secondary | ICD-10-CM | POA: Diagnosis not present

## 2015-08-01 NOTE — Patient Instructions (Signed)
Discussed orally 
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
## 2015-08-02 DIAGNOSIS — S83281D Other tear of lateral meniscus, current injury, right knee, subsequent encounter: Secondary | ICD-10-CM | POA: Diagnosis not present

## 2015-08-03 ENCOUNTER — Encounter: Payer: Self-pay | Admitting: Family Medicine

## 2015-08-05 DIAGNOSIS — M9901 Segmental and somatic dysfunction of cervical region: Secondary | ICD-10-CM | POA: Diagnosis not present

## 2015-08-05 DIAGNOSIS — M546 Pain in thoracic spine: Secondary | ICD-10-CM | POA: Diagnosis not present

## 2015-08-05 DIAGNOSIS — S134XXA Sprain of ligaments of cervical spine, initial encounter: Secondary | ICD-10-CM | POA: Diagnosis not present

## 2015-08-05 DIAGNOSIS — S335XXA Sprain of ligaments of lumbar spine, initial encounter: Secondary | ICD-10-CM | POA: Diagnosis not present

## 2015-08-05 DIAGNOSIS — M9902 Segmental and somatic dysfunction of thoracic region: Secondary | ICD-10-CM | POA: Diagnosis not present

## 2015-08-05 DIAGNOSIS — M9903 Segmental and somatic dysfunction of lumbar region: Secondary | ICD-10-CM | POA: Diagnosis not present

## 2015-08-08 DIAGNOSIS — J343 Hypertrophy of nasal turbinates: Secondary | ICD-10-CM | POA: Diagnosis not present

## 2015-08-08 DIAGNOSIS — J310 Chronic rhinitis: Secondary | ICD-10-CM | POA: Diagnosis not present

## 2015-08-08 DIAGNOSIS — J342 Deviated nasal septum: Secondary | ICD-10-CM | POA: Diagnosis not present

## 2015-08-08 DIAGNOSIS — J320 Chronic maxillary sinusitis: Secondary | ICD-10-CM | POA: Diagnosis not present

## 2015-08-10 ENCOUNTER — Ambulatory Visit (HOSPITAL_COMMUNITY): Payer: Self-pay | Admitting: Psychiatry

## 2015-08-17 ENCOUNTER — Ambulatory Visit (INDEPENDENT_AMBULATORY_CARE_PROVIDER_SITE_OTHER): Payer: Medicare Other | Admitting: Psychiatry

## 2015-08-17 DIAGNOSIS — F339 Major depressive disorder, recurrent, unspecified: Secondary | ICD-10-CM
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
## 2015-08-17 NOTE — Patient Instructions (Signed)
Discussed orally 

## 2015-08-18 DIAGNOSIS — E282 Polycystic ovarian syndrome: Secondary | ICD-10-CM | POA: Diagnosis not present

## 2015-08-18 DIAGNOSIS — E1122 Type 2 diabetes mellitus with diabetic chronic kidney disease: Secondary | ICD-10-CM | POA: Diagnosis not present

## 2015-08-18 DIAGNOSIS — G4733 Obstructive sleep apnea (adult) (pediatric): Secondary | ICD-10-CM | POA: Diagnosis not present

## 2015-08-18 DIAGNOSIS — D649 Anemia, unspecified: Secondary | ICD-10-CM | POA: Diagnosis not present

## 2015-08-18 DIAGNOSIS — G43909 Migraine, unspecified, not intractable, without status migrainosus: Secondary | ICD-10-CM | POA: Diagnosis not present

## 2015-08-18 DIAGNOSIS — G894 Chronic pain syndrome: Secondary | ICD-10-CM | POA: Diagnosis not present

## 2015-08-18 DIAGNOSIS — K529 Noninfective gastroenteritis and colitis, unspecified: Secondary | ICD-10-CM | POA: Diagnosis not present

## 2015-08-18 DIAGNOSIS — F329 Major depressive disorder, single episode, unspecified: Secondary | ICD-10-CM | POA: Diagnosis not present

## 2015-08-18 DIAGNOSIS — N189 Chronic kidney disease, unspecified: Secondary | ICD-10-CM | POA: Diagnosis not present

## 2015-08-18 DIAGNOSIS — J309 Allergic rhinitis, unspecified: Secondary | ICD-10-CM | POA: Diagnosis not present

## 2015-08-18 DIAGNOSIS — I10 Essential (primary) hypertension: Secondary | ICD-10-CM | POA: Diagnosis not present

## 2015-08-18 DIAGNOSIS — E739 Lactose intolerance, unspecified: Secondary | ICD-10-CM | POA: Diagnosis not present

## 2015-08-18 DIAGNOSIS — Z7901 Long term (current) use of anticoagulants: Secondary | ICD-10-CM | POA: Diagnosis not present

## 2015-08-18 DIAGNOSIS — F429 Obsessive-compulsive disorder, unspecified: Secondary | ICD-10-CM | POA: Diagnosis not present

## 2015-08-18 DIAGNOSIS — G4700 Insomnia, unspecified: Secondary | ICD-10-CM | POA: Diagnosis not present

## 2015-08-18 DIAGNOSIS — K219 Gastro-esophageal reflux disease without esophagitis: Secondary | ICD-10-CM | POA: Diagnosis not present

## 2015-08-18 DIAGNOSIS — K2960 Other gastritis without bleeding: Secondary | ICD-10-CM | POA: Diagnosis not present

## 2015-08-18 DIAGNOSIS — K911 Postgastric surgery syndromes: Secondary | ICD-10-CM | POA: Diagnosis not present

## 2015-08-18 DIAGNOSIS — F419 Anxiety disorder, unspecified: Secondary | ICD-10-CM | POA: Diagnosis not present

## 2015-08-18 DIAGNOSIS — Z7984 Long term (current) use of oral hypoglycemic drugs: Secondary | ICD-10-CM | POA: Diagnosis not present

## 2015-08-18 DIAGNOSIS — H538 Other visual disturbances: Secondary | ICD-10-CM | POA: Diagnosis not present

## 2015-08-18 DIAGNOSIS — K2970 Gastritis, unspecified, without bleeding: Secondary | ICD-10-CM | POA: Diagnosis not present

## 2015-08-18 DIAGNOSIS — I129 Hypertensive chronic kidney disease with stage 1 through stage 4 chronic kidney disease, or unspecified chronic kidney disease: Secondary | ICD-10-CM | POA: Diagnosis not present

## 2015-08-18 DIAGNOSIS — E538 Deficiency of other specified B group vitamins: Secondary | ICD-10-CM | POA: Diagnosis not present

## 2015-08-23 DIAGNOSIS — S335XXA Sprain of ligaments of lumbar spine, initial encounter: Secondary | ICD-10-CM | POA: Diagnosis not present

## 2015-08-23 DIAGNOSIS — M546 Pain in thoracic spine: Secondary | ICD-10-CM | POA: Diagnosis not present

## 2015-08-23 DIAGNOSIS — S134XXA Sprain of ligaments of cervical spine, initial encounter: Secondary | ICD-10-CM | POA: Diagnosis not present

## 2015-08-23 DIAGNOSIS — M9903 Segmental and somatic dysfunction of lumbar region: Secondary | ICD-10-CM | POA: Diagnosis not present

## 2015-08-23 DIAGNOSIS — M9901 Segmental and somatic dysfunction of cervical region: Secondary | ICD-10-CM | POA: Diagnosis not present

## 2015-08-23 DIAGNOSIS — M9902 Segmental and somatic dysfunction of thoracic region: Secondary | ICD-10-CM | POA: Diagnosis not present

## 2015-08-24 ENCOUNTER — Ambulatory Visit (INDEPENDENT_AMBULATORY_CARE_PROVIDER_SITE_OTHER): Payer: Medicare Other | Admitting: Psychiatry

## 2015-08-24 ENCOUNTER — Encounter (HOSPITAL_COMMUNITY): Payer: Self-pay | Admitting: Psychiatry

## 2015-08-24 DIAGNOSIS — F339 Major depressive disorder, recurrent, unspecified: Secondary | ICD-10-CM | POA: Diagnosis not present
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
## 2015-08-24 NOTE — Patient Instructions (Signed)
Discussed orally 

## 2015-08-31 ENCOUNTER — Ambulatory Visit (HOSPITAL_COMMUNITY): Payer: Self-pay | Admitting: Psychiatry

## 2015-09-01 DIAGNOSIS — T82898A Other specified complication of vascular prosthetic devices, implants and grafts, initial encounter: Secondary | ICD-10-CM | POA: Diagnosis not present

## 2015-09-01 DIAGNOSIS — D649 Anemia, unspecified: Secondary | ICD-10-CM | POA: Diagnosis not present

## 2015-09-01 DIAGNOSIS — K30 Functional dyspepsia: Secondary | ICD-10-CM | POA: Diagnosis not present

## 2015-09-01 DIAGNOSIS — R112 Nausea with vomiting, unspecified: Secondary | ICD-10-CM | POA: Diagnosis not present

## 2015-09-01 DIAGNOSIS — R140 Abdominal distension (gaseous): Secondary | ICD-10-CM | POA: Diagnosis not present

## 2015-09-01 DIAGNOSIS — I82629 Acute embolism and thrombosis of deep veins of unspecified upper extremity: Secondary | ICD-10-CM | POA: Diagnosis not present

## 2015-09-01 DIAGNOSIS — K5900 Constipation, unspecified: Secondary | ICD-10-CM | POA: Diagnosis not present

## 2015-09-01 DIAGNOSIS — R1030 Lower abdominal pain, unspecified: Secondary | ICD-10-CM | POA: Diagnosis not present

## 2015-09-07 ENCOUNTER — Telehealth (HOSPITAL_COMMUNITY): Payer: Self-pay | Admitting: *Deleted

## 2015-09-07 NOTE — Telephone Encounter (Signed)
lmtcb to resch April 21 appt. Number provided

## 2015-09-08 DIAGNOSIS — K5900 Constipation, unspecified: Secondary | ICD-10-CM | POA: Diagnosis not present

## 2015-09-08 DIAGNOSIS — A049 Bacterial intestinal infection, unspecified: Secondary | ICD-10-CM | POA: Diagnosis not present

## 2015-09-08 DIAGNOSIS — R140 Abdominal distension (gaseous): Secondary | ICD-10-CM | POA: Diagnosis not present

## 2015-09-14 DIAGNOSIS — M9901 Segmental and somatic dysfunction of cervical region: Secondary | ICD-10-CM | POA: Diagnosis not present

## 2015-09-14 DIAGNOSIS — S335XXA Sprain of ligaments of lumbar spine, initial encounter: Secondary | ICD-10-CM | POA: Diagnosis not present

## 2015-09-14 DIAGNOSIS — M546 Pain in thoracic spine: Secondary | ICD-10-CM | POA: Diagnosis not present

## 2015-09-14 DIAGNOSIS — M9902 Segmental and somatic dysfunction of thoracic region: Secondary | ICD-10-CM | POA: Diagnosis not present

## 2015-09-14 DIAGNOSIS — S134XXA Sprain of ligaments of cervical spine, initial encounter: Secondary | ICD-10-CM | POA: Diagnosis not present

## 2015-09-14 DIAGNOSIS — M9903 Segmental and somatic dysfunction of lumbar region: Secondary | ICD-10-CM | POA: Diagnosis not present

## 2015-09-15 ENCOUNTER — Ambulatory Visit (HOSPITAL_COMMUNITY): Payer: Self-pay | Admitting: Psychiatry

## 2015-09-23 ENCOUNTER — Ambulatory Visit (HOSPITAL_COMMUNITY): Payer: Self-pay | Admitting: Psychiatry

## 2015-09-27 DIAGNOSIS — M9903 Segmental and somatic dysfunction of lumbar region: Secondary | ICD-10-CM | POA: Diagnosis not present

## 2015-09-27 DIAGNOSIS — S134XXA Sprain of ligaments of cervical spine, initial encounter: Secondary | ICD-10-CM | POA: Diagnosis not present

## 2015-09-27 DIAGNOSIS — M546 Pain in thoracic spine: Secondary | ICD-10-CM | POA: Diagnosis not present

## 2015-09-27 DIAGNOSIS — M9902 Segmental and somatic dysfunction of thoracic region: Secondary | ICD-10-CM | POA: Diagnosis not present

## 2015-09-27 DIAGNOSIS — S335XXA Sprain of ligaments of lumbar spine, initial encounter: Secondary | ICD-10-CM | POA: Diagnosis not present

## 2015-09-27 DIAGNOSIS — M9901 Segmental and somatic dysfunction of cervical region: Secondary | ICD-10-CM | POA: Diagnosis not present

## 2015-09-30 ENCOUNTER — Ambulatory Visit (HOSPITAL_COMMUNITY): Payer: Self-pay | Admitting: Psychiatry

## 2015-10-05 ENCOUNTER — Ambulatory Visit (HOSPITAL_COMMUNITY): Payer: Self-pay | Admitting: Psychiatry

## 2015-10-05 DIAGNOSIS — Z79899 Other long term (current) drug therapy: Secondary | ICD-10-CM | POA: Diagnosis not present

## 2015-10-05 DIAGNOSIS — K219 Gastro-esophageal reflux disease without esophagitis: Secondary | ICD-10-CM | POA: Diagnosis not present

## 2015-10-05 DIAGNOSIS — Z713 Dietary counseling and surveillance: Secondary | ICD-10-CM | POA: Diagnosis not present

## 2015-10-05 DIAGNOSIS — Z9884 Bariatric surgery status: Secondary | ICD-10-CM | POA: Diagnosis not present

## 2015-10-05 DIAGNOSIS — K224 Dyskinesia of esophagus: Secondary | ICD-10-CM | POA: Diagnosis not present

## 2015-10-05 DIAGNOSIS — K912 Postsurgical malabsorption, not elsewhere classified: Secondary | ICD-10-CM | POA: Diagnosis not present

## 2015-10-07 ENCOUNTER — Ambulatory Visit (HOSPITAL_COMMUNITY): Payer: Self-pay | Admitting: Psychiatry

## 2015-10-11 DIAGNOSIS — F4310 Post-traumatic stress disorder, unspecified: Secondary | ICD-10-CM | POA: Diagnosis not present

## 2015-10-11 DIAGNOSIS — F322 Major depressive disorder, single episode, severe without psychotic features: Secondary | ICD-10-CM | POA: Diagnosis not present

## 2015-10-12 ENCOUNTER — Other Ambulatory Visit: Payer: Self-pay | Admitting: Nurse Practitioner

## 2015-10-12 DIAGNOSIS — E785 Hyperlipidemia, unspecified: Secondary | ICD-10-CM

## 2015-10-12 DIAGNOSIS — K760 Fatty (change of) liver, not elsewhere classified: Secondary | ICD-10-CM

## 2015-10-12 DIAGNOSIS — Z79899 Other long term (current) drug therapy: Secondary | ICD-10-CM

## 2015-10-12 DIAGNOSIS — E559 Vitamin D deficiency, unspecified: Secondary | ICD-10-CM

## 2015-10-12 DIAGNOSIS — R5382 Chronic fatigue, unspecified: Secondary | ICD-10-CM | POA: Insufficient documentation

## 2015-10-12 DIAGNOSIS — I10 Essential (primary) hypertension: Secondary | ICD-10-CM

## 2015-10-12 DIAGNOSIS — K912 Postsurgical malabsorption, not elsewhere classified: Secondary | ICD-10-CM

## 2015-10-12 DIAGNOSIS — E538 Deficiency of other specified B group vitamins: Secondary | ICD-10-CM

## 2015-10-13 DIAGNOSIS — M546 Pain in thoracic spine: Secondary | ICD-10-CM | POA: Diagnosis not present

## 2015-10-13 DIAGNOSIS — M9902 Segmental and somatic dysfunction of thoracic region: Secondary | ICD-10-CM | POA: Diagnosis not present

## 2015-10-13 DIAGNOSIS — S335XXA Sprain of ligaments of lumbar spine, initial encounter: Secondary | ICD-10-CM | POA: Diagnosis not present

## 2015-10-13 DIAGNOSIS — M9901 Segmental and somatic dysfunction of cervical region: Secondary | ICD-10-CM | POA: Diagnosis not present

## 2015-10-13 DIAGNOSIS — M9903 Segmental and somatic dysfunction of lumbar region: Secondary | ICD-10-CM | POA: Diagnosis not present

## 2015-10-13 DIAGNOSIS — S134XXA Sprain of ligaments of cervical spine, initial encounter: Secondary | ICD-10-CM | POA: Diagnosis not present

## 2015-10-16 ENCOUNTER — Other Ambulatory Visit: Payer: Self-pay | Admitting: Nurse Practitioner

## 2015-10-17 ENCOUNTER — Ambulatory Visit (INDEPENDENT_AMBULATORY_CARE_PROVIDER_SITE_OTHER): Payer: Medicare Other | Admitting: Family Medicine

## 2015-10-17 ENCOUNTER — Encounter: Payer: Self-pay | Admitting: Family Medicine

## 2015-10-17 VITALS — BP 128/82 | Temp 98.0°F | Ht 64.0 in | Wt 188.6 lb

## 2015-10-17 DIAGNOSIS — J329 Chronic sinusitis, unspecified: Secondary | ICD-10-CM | POA: Diagnosis not present

## 2015-10-17 MED ORDER — AZITHROMYCIN 250 MG PO TABS: ORAL_TABLET | ORAL | Status: DC

## 2015-10-17 NOTE — Progress Notes (Signed)
Subjective:    Patient ID: [name], female    DOB: [birth year], 35 y.o.   MRN: [id number]  Cough This is a new problem. The current episode started in the past 7 days. Associated symptoms include ear pain, headaches, nasal congestion and a sore throat. Treatments tried: cold meds.    Results for orders placed or performed in visit on 07/27/15  POCT HgB A1C  Result Value Ref Range   Hemoglobin A1C 5.3    No vomiting or diarrhea. Frontal headache. Cough productive of greenish phlegm. Possible low-grade fever though not sure   Review of Systems  HENT: Positive for ear pain and sore throat.   Respiratory: Positive for cough.   Neurological: Positive for headaches.       Objective:   Physical Exam  Alert vitals stable HEENT mom his congestion discharge pharynx normal neck supple lungs clear. Heart regular rate and rhythm.      Assessment & Plan:  Impression acute rhinosinusitis element of bronchitis plan antibiotics prescribed. Symptom care discussed warning signs discussed WSL

## 2015-10-18 DIAGNOSIS — Z79899 Other long term (current) drug therapy: Secondary | ICD-10-CM | POA: Diagnosis not present

## 2015-10-18 DIAGNOSIS — K760 Fatty (change of) liver, not elsewhere classified: Secondary | ICD-10-CM | POA: Diagnosis not present

## 2015-10-18 DIAGNOSIS — K912 Postsurgical malabsorption, not elsewhere classified: Secondary | ICD-10-CM | POA: Diagnosis not present

## 2015-10-18 DIAGNOSIS — I10 Essential (primary) hypertension: Secondary | ICD-10-CM | POA: Diagnosis not present

## 2015-10-18 DIAGNOSIS — R5382 Chronic fatigue, unspecified: Secondary | ICD-10-CM | POA: Diagnosis not present

## 2015-10-18 DIAGNOSIS — E785 Hyperlipidemia, unspecified: Secondary | ICD-10-CM | POA: Diagnosis not present

## 2015-10-18 DIAGNOSIS — F4310 Post-traumatic stress disorder, unspecified: Secondary | ICD-10-CM | POA: Diagnosis not present

## 2015-10-18 DIAGNOSIS — E538 Deficiency of other specified B group vitamins: Secondary | ICD-10-CM | POA: Diagnosis not present

## 2015-10-19 DIAGNOSIS — D508 Other iron deficiency anemias: Secondary | ICD-10-CM | POA: Diagnosis not present

## 2015-10-19 DIAGNOSIS — Z452 Encounter for adjustment and management of vascular access device: Secondary | ICD-10-CM | POA: Diagnosis not present

## 2015-10-19 LAB — CBC WITH DIFFERENTIAL/PLATELET
Basophils Absolute: 0 10*3/uL (ref 0.0–0.2)
Basos: 0 %
EOS (ABSOLUTE): 0.1 10*3/uL (ref 0.0–0.4)
EOS: 1 %
HEMOGLOBIN: 14.5 g/dL (ref 11.1–15.9)
Hematocrit: 44.9 % (ref 34.0–46.6)
Immature Grans (Abs): 0 10*3/uL (ref 0.0–0.1)
Immature Granulocytes: 0 %
LYMPHS ABS: 1.9 10*3/uL (ref 0.7–3.1)
Lymphs: 28 %
MCH: 30.7 pg (ref 26.6–33.0)
MCHC: 32.3 g/dL (ref 31.5–35.7)
MCV: 95 fL (ref 79–97)
Monocytes Absolute: 0.3 10*3/uL (ref 0.1–0.9)
Monocytes: 4 %
NEUTROS PCT: 67 %
Neutrophils Absolute: 4.7 10*3/uL (ref 1.4–7.0)
Platelets: 238 10*3/uL (ref 150–379)
RBC: 4.72 x10E6/uL (ref 3.77–5.28)
RDW: 13.3 % (ref 12.3–15.4)
WBC: 7.1 10*3/uL (ref 3.4–10.8)

## 2015-10-19 LAB — BASIC METABOLIC PANEL
BUN / CREAT RATIO: 11 (ref 9–23)
BUN: 9 mg/dL (ref 6–20)
CO2: 21 mmol/L (ref 18–29)
CREATININE: 0.79 mg/dL (ref 0.57–1.00)
Calcium: 10 mg/dL (ref 8.7–10.2)
Chloride: 102 mmol/L (ref 96–106)
GFR calc Af Amer: 113 mL/min/{1.73_m2} (ref >59)
GFR calc non Af Amer: 98 mL/min/{1.73_m2} (ref >59)
GLUCOSE: 100 mg/dL — AB (ref 65–99)
POTASSIUM: 3.8 mmol/L (ref 3.5–5.2)
SODIUM: 142 mmol/L (ref 134–144)

## 2015-10-19 LAB — LITHIUM LEVEL: LITHIUM LVL: 0.4 mmol/L — AB (ref 0.6–1.2)

## 2015-10-19 LAB — HEPATIC FUNCTION PANEL
ALK PHOS: 83 IU/L (ref 39–117)
ALT: 18 IU/L (ref 0–32)
AST: 16 IU/L (ref 0–40)
Albumin: 4.7 g/dL (ref 3.5–5.5)
Bilirubin Total: 0.4 mg/dL (ref 0.0–1.2)
Bilirubin, Direct: 0.11 mg/dL (ref 0.00–0.40)
TOTAL PROTEIN: 7.3 g/dL (ref 6.0–8.5)

## 2015-10-19 LAB — LIPID PANEL
CHOL/HDL RATIO: 3 ratio (ref 0.0–4.4)
Cholesterol, Total: 192 mg/dL (ref 100–199)
HDL: 65 mg/dL (ref >39)
LDL CALC: 101 mg/dL — AB (ref 0–99)
Triglycerides: 130 mg/dL (ref 0–149)
VLDL CHOLESTEROL CAL: 26 mg/dL (ref 5–40)

## 2015-10-19 LAB — VITAMIN B12: Vitamin B-12: 470 pg/mL (ref 211–946)

## 2015-10-19 LAB — TSH: TSH: 2.32 u[IU]/mL (ref 0.450–4.500)

## 2015-10-19 LAB — FERRITIN: FERRITIN: 361 ng/mL — AB (ref 15–150)

## 2015-10-25 ENCOUNTER — Telehealth: Payer: Self-pay | Admitting: Nurse Practitioner

## 2015-10-25 NOTE — Telephone Encounter (Signed)
[name],  Pt calling to see where her labs are that was supposed to be sent to  Her new doc, Dr Galen Manila at [facility]   Please advise when this will be done.

## 2015-10-27 NOTE — Telephone Encounter (Signed)
Nurses please get fax number and send recent labs. Thanks.

## 2015-10-27 NOTE — Telephone Encounter (Signed)
Routed to medical records. Nurses unable to print labs.

## 2015-11-03 ENCOUNTER — Ambulatory Visit (INDEPENDENT_AMBULATORY_CARE_PROVIDER_SITE_OTHER): Payer: Medicare Other | Admitting: Nurse Practitioner

## 2015-11-03 ENCOUNTER — Ambulatory Visit (INDEPENDENT_AMBULATORY_CARE_PROVIDER_SITE_OTHER): Payer: Medicare Other | Admitting: Advanced Practice Midwife

## 2015-11-03 ENCOUNTER — Encounter: Payer: Self-pay | Admitting: Nurse Practitioner

## 2015-11-03 ENCOUNTER — Encounter: Payer: Self-pay | Admitting: Advanced Practice Midwife

## 2015-11-03 VITALS — BP 128/84 | Ht 64.0 in | Wt 190.0 lb

## 2015-11-03 VITALS — BP 108/74 | HR 76 | Wt 190.0 lb

## 2015-11-03 DIAGNOSIS — N760 Acute vaginitis: Secondary | ICD-10-CM

## 2015-11-03 DIAGNOSIS — Z113 Encounter for screening for infections with a predominantly sexual mode of transmission: Secondary | ICD-10-CM | POA: Diagnosis not present

## 2015-11-03 DIAGNOSIS — N898 Other specified noninflammatory disorders of vagina: Secondary | ICD-10-CM | POA: Diagnosis not present

## 2015-11-03 DIAGNOSIS — A499 Bacterial infection, unspecified: Secondary | ICD-10-CM

## 2015-11-03 DIAGNOSIS — Z20818 Contact with and (suspected) exposure to other bacterial communicable diseases: Secondary | ICD-10-CM

## 2015-11-03 DIAGNOSIS — E785 Hyperlipidemia, unspecified: Secondary | ICD-10-CM | POA: Diagnosis not present

## 2015-11-03 DIAGNOSIS — B373 Candidiasis of vulva and vagina: Secondary | ICD-10-CM | POA: Diagnosis not present

## 2015-11-03 DIAGNOSIS — Z975 Presence of (intrauterine) contraceptive device: Secondary | ICD-10-CM

## 2015-11-03 DIAGNOSIS — Z20828 Contact with and (suspected) exposure to other viral communicable diseases: Secondary | ICD-10-CM

## 2015-11-03 DIAGNOSIS — Z1159 Encounter for screening for other viral diseases: Secondary | ICD-10-CM | POA: Diagnosis not present

## 2015-11-03 DIAGNOSIS — E119 Type 2 diabetes mellitus without complications: Secondary | ICD-10-CM | POA: Diagnosis not present

## 2015-11-03 DIAGNOSIS — Z114 Encounter for screening for human immunodeficiency virus [HIV]: Secondary | ICD-10-CM | POA: Diagnosis not present

## 2015-11-03 DIAGNOSIS — B9689 Other specified bacterial agents as the cause of diseases classified elsewhere: Secondary | ICD-10-CM

## 2015-11-03 MED ORDER — METRONIDAZOLE 0.75 % VA GEL: 1.0000 | Freq: Every day | VAGINAL | Status: DC

## 2015-11-03 NOTE — Patient Instructions (Signed)
Results for orders placed or performed in visit on 0000000  Basic metabolic panel  Result Value Ref Range   Glucose 100 (H) 65 - 99 mg/dL   BUN 9 6 - 20 mg/dL   Creatinine, Ser 0.79 0.57 - 1.00 mg/dL   GFR calc non Af Amer 98 >59 mL/min/1.73   GFR calc Af Amer 113 >59 mL/min/1.73   BUN/Creatinine Ratio 11 9 - 23   Sodium 142 134 - 144 mmol/L   Potassium 3.8 3.5 - 5.2 mmol/L   Chloride 102 96 - 106 mmol/L   CO2 21 18 - 29 mmol/L   Calcium 10.0 8.7 - 10.2 mg/dL  TSH  Result Value Ref Range   TSH 2.320 0.450 - 4.500 uIU/mL  Lipid panel  Result Value Ref Range   Cholesterol, Total 192 100 - 199 mg/dL   Triglycerides 130 0 - 149 mg/dL   HDL 65 >39 mg/dL   VLDL Cholesterol Cal 26 5 - 40 mg/dL   LDL Calculated 101 (H) 0 - 99 mg/dL   Chol/HDL Ratio 3.0 0.0 - 4.4 ratio units  Hepatic function panel  Result Value Ref Range   Total Protein 7.3 6.0 - 8.5 g/dL   Albumin 4.7 3.5 - 5.5 g/dL   Bilirubin Total 0.4 0.0 - 1.2 mg/dL   Bilirubin, Direct 0.11 0.00 - 0.40 mg/dL   Alkaline Phosphatase 83 39 - 117 IU/L   AST 16 0 - 40 IU/L   ALT 18 0 - 32 IU/L  Vitamin B12  Result Value Ref Range   Vitamin B-12 470 211 - 946 pg/mL  Lithium level  Result Value Ref Range   Lithium Lvl 0.4 (L) 0.6 - 1.2 mmol/L  Ferritin  Result Value Ref Range   Ferritin 361 (H) 15 - 150 ng/mL  CBC with Differential/Platelet  Result Value Ref Range   WBC 7.1 3.4 - 10.8 x10E3/uL   RBC 4.72 3.77 - 5.28 x10E6/uL   Hemoglobin 14.5 11.1 - 15.9 g/dL   Hematocrit 44.9 34.0 - 46.6 %   MCV 95 79 - 97 fL   MCH 30.7 26.6 - 33.0 pg   MCHC 32.3 31.5 - 35.7 g/dL   RDW 13.3 12.3 - 15.4 %   Platelets 238 150 - 379 x10E3/uL   Neutrophils 67 %   Lymphs 28 %   Monocytes 4 %   Eos 1 %   Basos 0 %   Neutrophils Absolute 4.7 1.4 - 7.0 x10E3/uL   Lymphocytes Absolute 1.9 0.7 - 3.1 x10E3/uL   Monocytes Absolute 0.3 0.1 - 0.9 x10E3/uL   EOS (ABSOLUTE) 0.1 0.0 - 0.4 x10E3/uL   Basophils Absolute 0.0 0.0 - 0.2 x10E3/uL   Immature Granulocytes 0 %   Immature Grans (Abs) 0.0 0.0 - 0.1 x10E3/uL
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
## 2015-11-03 NOTE — Progress Notes (Signed)
Subjective:  Presents for routine follow-up. Has been trying to do better with her diet. Still has limited activity. Continues to follow-up with psychiatry, mental health counselor and specialist for GI issues.  Objective:   BP 128/84 mmHg  Ht 5\' 4"  (1.626 m)  Wt 190 lb (86.183 kg)  BMI 32.60 kg/m2 NAD. Alert, oriented. Lungs clear. Heart regular rate rhythm. Reviewed labs with patient from 10/18/2015. Given a copy for her specialists.  Assessment:  Problem List Items Addressed This Visit      Endocrine   Type II diabetes mellitus ([place]) - Primary    Other Visit Diagnoses    Hyperlipemia          Plan: Continue current meds as directed. Strongly encourage regular activity beginning with a walking program. Continue to work on weight loss. Return in about 6 months (around 05/04/2016) for recheck.

## 2015-11-04 LAB — RPR: RPR Ser Ql: NONREACTIVE

## 2015-11-04 LAB — HIV ANTIBODY (ROUTINE TESTING W REFLEX): HIV SCREEN 4TH GENERATION: NONREACTIVE

## 2015-11-06 LAB — NUSWAB VAGINITIS PLUS (VG+)
CANDIDA ALBICANS, NAA: NEGATIVE
Candida glabrata, NAA: NEGATIVE
Chlamydia trachomatis, NAA: NEGATIVE
Neisseria gonorrhoeae, NAA: NEGATIVE
Trich vag by NAA: NEGATIVE

## 2015-11-10 DIAGNOSIS — I82629 Acute embolism and thrombosis of deep veins of unspecified upper extremity: Secondary | ICD-10-CM | POA: Diagnosis not present

## 2015-11-10 DIAGNOSIS — D509 Iron deficiency anemia, unspecified: Secondary | ICD-10-CM | POA: Diagnosis not present

## 2015-11-10 DIAGNOSIS — Z9884 Bariatric surgery status: Secondary | ICD-10-CM | POA: Diagnosis not present

## 2015-11-10 DIAGNOSIS — E611 Iron deficiency: Secondary | ICD-10-CM | POA: Diagnosis not present

## 2015-11-23 DIAGNOSIS — F4310 Post-traumatic stress disorder, unspecified: Secondary | ICD-10-CM | POA: Diagnosis not present

## 2015-11-23 DIAGNOSIS — F322 Major depressive disorder, single episode, severe without psychotic features: Secondary | ICD-10-CM | POA: Diagnosis not present

## 2015-11-24 ENCOUNTER — Ambulatory Visit: Payer: Medicare Other | Admitting: Nurse Practitioner

## 2015-11-24 ENCOUNTER — Encounter: Payer: Self-pay | Admitting: Nurse Practitioner

## 2015-11-24 ENCOUNTER — Ambulatory Visit (INDEPENDENT_AMBULATORY_CARE_PROVIDER_SITE_OTHER): Payer: Medicare Other | Admitting: Nurse Practitioner

## 2015-11-24 VITALS — BP 128/86 | Temp 97.8°F | Ht 64.0 in | Wt 191.6 lb

## 2015-11-24 DIAGNOSIS — J069 Acute upper respiratory infection, unspecified: Secondary | ICD-10-CM

## 2015-11-24 DIAGNOSIS — B9689 Other specified bacterial agents as the cause of diseases classified elsewhere: Secondary | ICD-10-CM

## 2015-11-24 MED ORDER — ALBUTEROL SULFATE HFA 108 (90 BASE) MCG/ACT IN AERS: 2.0000 | INHALATION_SPRAY | RESPIRATORY_TRACT | Status: DC | PRN

## 2015-11-24 MED ORDER — AMOXICILLIN-POT CLAVULANATE 875-125 MG PO TABS: 1.0000 | ORAL_TABLET | Freq: Two times a day (BID) | ORAL | Status: DC

## 2015-11-24 NOTE — Progress Notes (Signed)
Subjective:  Presents for c/o frequent cough x 1 week. Producing green-yellow sputum. No fever. No H/A or sore throat. Bilateral ear pressure. Runny nose. Wheezing at night. Frequent reflux. Being followed by GI specialist. Constant tickle in throat.   Objective:   BP 128/86 mmHg  Temp(Src) 97.8 F (36.6 C) (Oral)  Ht 5\' 4"  (1.626 m)  Wt 191 lb 9.6 oz (86.909 kg)  BMI 32.87 kg/m2 NAD. Alert, oriented. TMs clear effusion. Pharynx injected with PND noted. Neck supple with mild anterior adenopathy. Lungs clear. Heart RRR.   Assessment: Bacterial upper respiratory infection  Plan:  Meds ordered this encounter  Medications  . amoxicillin-clavulanate (AUGMENTIN) 875-125 MG tablet    Sig: Take 1 tablet by mouth 2 (two) times daily.    Dispense:  20 tablet    Refill:  0    Order Specific Question:  Supervising Provider    Answer:  Mikey Kirschner [[id number]]  . albuterol (VENTOLIN HFA) 108 (90 Base) MCG/ACT inhaler    Sig: Inhale 2 puffs into the lungs every 4 (four) hours as needed for wheezing or shortness of breath.    Dispense:  18 g    Refill:  PRN    Order Specific Question:  Supervising Provider    Answer:  Mikey Kirschner [[id number]]   Mucinex DM as directed. Use albuterol and carafate before bed. Call back if worsens or persists.

## 2015-11-28 ENCOUNTER — Other Ambulatory Visit: Payer: Self-pay | Admitting: Nurse Practitioner

## 2015-11-28 MED ORDER — PROMETHAZINE HCL 25 MG PO TABS: 25.0000 mg | ORAL_TABLET | ORAL | Status: DC | PRN

## 2015-11-28 MED ORDER — LEVOFLOXACIN 500 MG PO TABS: 500.0000 mg | ORAL_TABLET | Freq: Every day | ORAL | Status: DC

## 2015-11-28 NOTE — Progress Notes (Signed)
Phone message from patient: current antibiotic is making her nauseous with vomiting and diarrhea. Will switch antibiotic and send in Phenergan. Call back if no improvement.

## 2015-11-30 DIAGNOSIS — R112 Nausea with vomiting, unspecified: Secondary | ICD-10-CM | POA: Diagnosis not present

## 2015-11-30 DIAGNOSIS — K5900 Constipation, unspecified: Secondary | ICD-10-CM | POA: Diagnosis not present

## 2015-11-30 DIAGNOSIS — K210 Gastro-esophageal reflux disease with esophagitis: Secondary | ICD-10-CM | POA: Diagnosis not present

## 2015-12-08 DIAGNOSIS — K210 Gastro-esophageal reflux disease with esophagitis: Secondary | ICD-10-CM | POA: Diagnosis not present

## 2015-12-08 DIAGNOSIS — Z86718 Personal history of other venous thrombosis and embolism: Secondary | ICD-10-CM | POA: Diagnosis not present

## 2015-12-08 DIAGNOSIS — Z7901 Long term (current) use of anticoagulants: Secondary | ICD-10-CM | POA: Diagnosis not present

## 2015-12-08 DIAGNOSIS — K2900 Acute gastritis without bleeding: Secondary | ICD-10-CM | POA: Diagnosis not present

## 2015-12-08 DIAGNOSIS — F329 Major depressive disorder, single episode, unspecified: Secondary | ICD-10-CM | POA: Diagnosis not present

## 2015-12-08 DIAGNOSIS — Z9884 Bariatric surgery status: Secondary | ICD-10-CM | POA: Diagnosis not present

## 2015-12-08 DIAGNOSIS — F419 Anxiety disorder, unspecified: Secondary | ICD-10-CM | POA: Diagnosis not present

## 2015-12-08 DIAGNOSIS — G4730 Sleep apnea, unspecified: Secondary | ICD-10-CM | POA: Diagnosis not present

## 2015-12-09 DIAGNOSIS — Z9884 Bariatric surgery status: Secondary | ICD-10-CM | POA: Diagnosis not present

## 2015-12-09 DIAGNOSIS — K210 Gastro-esophageal reflux disease with esophagitis: Secondary | ICD-10-CM | POA: Diagnosis not present

## 2015-12-09 DIAGNOSIS — Z86718 Personal history of other venous thrombosis and embolism: Secondary | ICD-10-CM | POA: Diagnosis not present

## 2015-12-09 DIAGNOSIS — K2900 Acute gastritis without bleeding: Secondary | ICD-10-CM | POA: Diagnosis not present

## 2015-12-09 DIAGNOSIS — F419 Anxiety disorder, unspecified: Secondary | ICD-10-CM | POA: Diagnosis not present

## 2015-12-09 DIAGNOSIS — G4730 Sleep apnea, unspecified: Secondary | ICD-10-CM | POA: Diagnosis not present

## 2015-12-21 DIAGNOSIS — Z9884 Bariatric surgery status: Secondary | ICD-10-CM | POA: Diagnosis not present

## 2015-12-21 DIAGNOSIS — K912 Postsurgical malabsorption, not elsewhere classified: Secondary | ICD-10-CM | POA: Diagnosis not present

## 2015-12-21 DIAGNOSIS — Z48815 Encounter for surgical aftercare following surgery on the digestive system: Secondary | ICD-10-CM | POA: Diagnosis not present

## 2015-12-21 DIAGNOSIS — K219 Gastro-esophageal reflux disease without esophagitis: Secondary | ICD-10-CM | POA: Diagnosis not present

## 2016-01-08 ENCOUNTER — Other Ambulatory Visit: Payer: Self-pay | Admitting: Nurse Practitioner

## 2016-01-09 ENCOUNTER — Other Ambulatory Visit: Payer: Self-pay | Admitting: *Deleted

## 2016-01-09 MED ORDER — PRAVASTATIN SODIUM 10 MG PO TABS: ORAL_TABLET | ORAL | 0 refills | Status: DC

## 2016-01-12 ENCOUNTER — Emergency Department (HOSPITAL_COMMUNITY)
Admission: EM | Admit: 2016-01-12 | Discharge: 2016-01-12 | Disposition: A | Discharge status: Home / Self Care | Payer: Medicare Other | Attending: Emergency Medicine | Admitting: Emergency Medicine

## 2016-01-12 ENCOUNTER — Encounter (HOSPITAL_COMMUNITY): Payer: Self-pay | Admitting: Emergency Medicine

## 2016-01-12 DIAGNOSIS — Z7984 Long term (current) use of oral hypoglycemic drugs: Secondary | ICD-10-CM | POA: Diagnosis not present

## 2016-01-12 DIAGNOSIS — E119 Type 2 diabetes mellitus without complications: Secondary | ICD-10-CM | POA: Diagnosis not present

## 2016-01-12 DIAGNOSIS — R112 Nausea with vomiting, unspecified: Secondary | ICD-10-CM

## 2016-01-12 DIAGNOSIS — Z79899 Other long term (current) drug therapy: Secondary | ICD-10-CM | POA: Insufficient documentation

## 2016-01-12 DIAGNOSIS — I10 Essential (primary) hypertension: Secondary | ICD-10-CM | POA: Diagnosis not present

## 2016-01-12 DIAGNOSIS — K920 Hematemesis: Secondary | ICD-10-CM | POA: Diagnosis present

## 2016-01-12 DIAGNOSIS — Z7901 Long term (current) use of anticoagulants: Secondary | ICD-10-CM | POA: Diagnosis not present

## 2016-01-12 LAB — CBC WITH DIFFERENTIAL/PLATELET
Basophils Absolute: 0 10*3/uL (ref 0.0–0.1)
Basophils Relative: 0 %
EOS ABS: 0.2 10*3/uL (ref 0.0–0.7)
EOS PCT: 2 %
HCT: 41.4 % (ref 36.0–46.0)
Hemoglobin: 13.6 g/dL (ref 12.0–15.0)
LYMPHS ABS: 5.6 10*3/uL — AB (ref 0.7–4.0)
Lymphocytes Relative: 47 %
MCH: 30.7 pg (ref 26.0–34.0)
MCHC: 32.9 g/dL (ref 30.0–36.0)
MCV: 93.5 fL (ref 78.0–100.0)
Monocytes Absolute: 0.8 10*3/uL (ref 0.1–1.0)
Monocytes Relative: 7 %
Neutro Abs: 5.3 10*3/uL (ref 1.7–7.7)
Neutrophils Relative %: 44 %
PLATELETS: 224 10*3/uL (ref 150–400)
RBC: 4.43 MIL/uL (ref 3.87–5.11)
RDW: 13.3 % (ref 11.5–15.5)
WBC: 12.1 10*3/uL — ABNORMAL HIGH (ref 4.0–10.5)

## 2016-01-12 LAB — I-STAT CG4 LACTIC ACID, ED: LACTIC ACID, VENOUS: 1.4 mmol/L (ref 0.5–1.9)

## 2016-01-12 LAB — COMPREHENSIVE METABOLIC PANEL
ALBUMIN: 4.1 g/dL (ref 3.5–5.0)
ALT: 22 U/L (ref 14–54)
AST: 21 U/L (ref 15–41)
Alkaline Phosphatase: 70 U/L (ref 38–126)
Anion gap: 7 (ref 5–15)
BUN: 9 mg/dL (ref 6–20)
CO2: 25 mmol/L (ref 22–32)
CREATININE: 0.75 mg/dL (ref 0.44–1.00)
Calcium: 8.5 mg/dL — ABNORMAL LOW (ref 8.9–10.3)
Chloride: 106 mmol/L (ref 101–111)
GFR calc non Af Amer: >60 mL/min (ref >60)
GLUCOSE: 68 mg/dL (ref 65–99)
Potassium: 3.3 mmol/L — ABNORMAL LOW (ref 3.5–5.1)
SODIUM: 138 mmol/L (ref 135–145)
TOTAL PROTEIN: 7 g/dL (ref 6.5–8.1)
Total Bilirubin: 0.3 mg/dL (ref 0.3–1.2)

## 2016-01-12 LAB — CBG MONITORING, ED: Glucose-Capillary: 71 mg/dL (ref 65–99)

## 2016-01-12 LAB — LIPASE, BLOOD: Lipase: 27 U/L (ref 11–51)

## 2016-01-12 MED ORDER — SODIUM CHLORIDE 0.9 % IV SOLN
1000.0000 mL | INTRAVENOUS | Status: DC
Start: 2016-01-12 — End: 2016-01-12

## 2016-01-12 MED ORDER — SODIUM CHLORIDE 0.9 % IV SOLN
1000.0000 mL | Freq: Once | INTRAVENOUS | Status: AC
  Administered 2016-01-12: 1000 mL via INTRAVENOUS

## 2016-01-12 MED ORDER — ONDANSETRON HCL 4 MG/2ML IJ SOLN
4.0000 mg | Freq: Once | INTRAMUSCULAR | Status: AC
  Administered 2016-01-12: 4 mg via INTRAVENOUS
  Filled 2016-01-12: qty 2

## 2016-01-12 MED ORDER — PROMETHAZINE HCL 25 MG/ML IJ SOLN
25.0000 mg | Freq: Once | INTRAMUSCULAR | Status: AC
  Administered 2016-01-12: 25 mg via INTRAVENOUS
  Filled 2016-01-12: qty 1

## 2016-01-12 MED ORDER — HEPARIN SOD (PORK) LOCK FLUSH 100 UNIT/ML IV SOLN
INTRAVENOUS | Status: AC
  Filled 2016-01-12: qty 5

## 2016-01-12 NOTE — Discharge Instructions (Signed)
Continue to take all of your ulcer medications. Continue to take Phenergan as needed for nausea. Return if you are having any problems.

## 2016-01-12 NOTE — ED Triage Notes (Signed)
Pt c/o having one episode of blood tinged vomit and c/o left upper abd pain.
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
## 2016-01-13 DIAGNOSIS — Z13810 Encounter for screening for upper gastrointestinal disorder: Secondary | ICD-10-CM | POA: Diagnosis not present

## 2016-01-13 DIAGNOSIS — E282 Polycystic ovarian syndrome: Secondary | ICD-10-CM | POA: Diagnosis not present

## 2016-01-13 DIAGNOSIS — J309 Allergic rhinitis, unspecified: Secondary | ICD-10-CM | POA: Diagnosis not present

## 2016-01-13 DIAGNOSIS — K920 Hematemesis: Secondary | ICD-10-CM | POA: Diagnosis not present

## 2016-01-13 DIAGNOSIS — G4733 Obstructive sleep apnea (adult) (pediatric): Secondary | ICD-10-CM | POA: Diagnosis not present

## 2016-01-13 DIAGNOSIS — Z903 Acquired absence of stomach [part of]: Secondary | ICD-10-CM | POA: Diagnosis not present

## 2016-01-13 DIAGNOSIS — F429 Obsessive-compulsive disorder, unspecified: Secondary | ICD-10-CM | POA: Diagnosis not present

## 2016-01-13 DIAGNOSIS — G894 Chronic pain syndrome: Secondary | ICD-10-CM | POA: Diagnosis not present

## 2016-01-13 DIAGNOSIS — Z980 Intestinal bypass and anastomosis status: Secondary | ICD-10-CM | POA: Diagnosis not present

## 2016-01-13 DIAGNOSIS — E669 Obesity, unspecified: Secondary | ICD-10-CM | POA: Diagnosis not present

## 2016-01-13 DIAGNOSIS — I129 Hypertensive chronic kidney disease with stage 1 through stage 4 chronic kidney disease, or unspecified chronic kidney disease: Secondary | ICD-10-CM | POA: Diagnosis not present

## 2016-01-13 DIAGNOSIS — E1122 Type 2 diabetes mellitus with diabetic chronic kidney disease: Secondary | ICD-10-CM | POA: Diagnosis not present

## 2016-01-13 DIAGNOSIS — Z86718 Personal history of other venous thrombosis and embolism: Secondary | ICD-10-CM | POA: Diagnosis not present

## 2016-01-13 DIAGNOSIS — F419 Anxiety disorder, unspecified: Secondary | ICD-10-CM | POA: Diagnosis not present

## 2016-01-13 DIAGNOSIS — K219 Gastro-esophageal reflux disease without esophagitis: Secondary | ICD-10-CM | POA: Diagnosis not present

## 2016-01-13 DIAGNOSIS — L680 Hirsutism: Secondary | ICD-10-CM | POA: Diagnosis not present

## 2016-01-13 DIAGNOSIS — Z7984 Long term (current) use of oral hypoglycemic drugs: Secondary | ICD-10-CM | POA: Diagnosis not present

## 2016-01-13 DIAGNOSIS — Z7901 Long term (current) use of anticoagulants: Secondary | ICD-10-CM | POA: Diagnosis not present

## 2016-01-13 DIAGNOSIS — Z6832 Body mass index (BMI) 32.0-32.9, adult: Secondary | ICD-10-CM | POA: Diagnosis not present

## 2016-01-13 DIAGNOSIS — Z9884 Bariatric surgery status: Secondary | ICD-10-CM | POA: Diagnosis not present

## 2016-01-13 DIAGNOSIS — N189 Chronic kidney disease, unspecified: Secondary | ICD-10-CM | POA: Diagnosis not present

## 2016-01-13 DIAGNOSIS — E538 Deficiency of other specified B group vitamins: Secondary | ICD-10-CM | POA: Diagnosis not present

## 2016-01-18 DIAGNOSIS — F322 Major depressive disorder, single episode, severe without psychotic features: Secondary | ICD-10-CM | POA: Diagnosis not present

## 2016-01-18 DIAGNOSIS — F4310 Post-traumatic stress disorder, unspecified: Secondary | ICD-10-CM | POA: Diagnosis not present

## 2016-01-22 ENCOUNTER — Other Ambulatory Visit: Payer: Self-pay | Admitting: Nurse Practitioner

## 2016-01-22 MED ORDER — TRIAMCINOLONE ACETONIDE 0.1 % EX CREA: 1.0000 "application " | TOPICAL_CREAM | Freq: Two times a day (BID) | CUTANEOUS | 0 refills | Status: DC

## 2016-01-22 MED ORDER — TERCONAZOLE 0.4 % VA CREA: 1.0000 | TOPICAL_CREAM | Freq: Every day | VAGINAL | 0 refills | Status: DC

## 2016-02-07 DIAGNOSIS — N870 Mild cervical dysplasia: Secondary | ICD-10-CM | POA: Diagnosis not present

## 2016-02-07 DIAGNOSIS — R87612 Low grade squamous intraepithelial lesion on cytologic smear of cervix (LGSIL): Secondary | ICD-10-CM | POA: Diagnosis not present

## 2016-02-07 DIAGNOSIS — Z01419 Encounter for gynecological examination (general) (routine) without abnormal findings: Secondary | ICD-10-CM | POA: Diagnosis not present

## 2016-02-07 DIAGNOSIS — Z124 Encounter for screening for malignant neoplasm of cervix: Secondary | ICD-10-CM | POA: Diagnosis not present

## 2016-02-07 DIAGNOSIS — B373 Candidiasis of vulva and vagina: Secondary | ICD-10-CM | POA: Diagnosis not present

## 2016-02-14 ENCOUNTER — Ambulatory Visit (INDEPENDENT_AMBULATORY_CARE_PROVIDER_SITE_OTHER): Payer: Medicare Other | Admitting: Family Medicine

## 2016-02-14 ENCOUNTER — Encounter: Payer: Self-pay | Admitting: Family Medicine

## 2016-02-14 VITALS — BP 118/82 | Temp 98.1°F | Ht 64.0 in | Wt 189.0 lb

## 2016-02-14 DIAGNOSIS — J019 Acute sinusitis, unspecified: Secondary | ICD-10-CM

## 2016-02-14 DIAGNOSIS — J329 Chronic sinusitis, unspecified: Secondary | ICD-10-CM | POA: Diagnosis not present

## 2016-02-14 DIAGNOSIS — R21 Rash and other nonspecific skin eruption: Secondary | ICD-10-CM

## 2016-02-14 DIAGNOSIS — Z23 Encounter for immunization: Secondary | ICD-10-CM | POA: Diagnosis not present

## 2016-02-14 DIAGNOSIS — B9689 Other specified bacterial agents as the cause of diseases classified elsewhere: Secondary | ICD-10-CM

## 2016-02-14 MED ORDER — AMOXICILLIN 500 MG PO TABS: ORAL_TABLET | ORAL | 0 refills | Status: DC

## 2016-02-14 MED ORDER — CLOBETASOL PROP EMOLLIENT BASE 0.05 % EX CREA: TOPICAL_CREAM | CUTANEOUS | 1 refills | Status: DC

## 2016-02-14 NOTE — Progress Notes (Signed)
Subjective:    Patient ID: [name], female    DOB: 21-Jun-1980, 35 y.o.   MRN: [id number] Patient arrives office with several distinct concerns Rash  This is a new problem. The affected locations include the groin, left upper leg, left lower leg, right upper leg and right lower leg. Associated symptoms include congestion, coughing and a sore throat.  URI   Associated symptoms include congestion, coughing, ear pain, a rash and a sore throat. Treatments tried: Mucinex.   Patient has had cough drainage runny nose. Cough productive yellowish phlegm diminished energy left ear discomfort and pressure.  Patient is developed a rash. Pruritic in nature. Saw both [facility] and an OB/GYN specialist. 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 Was advised to use clobetasol. Wonders if she should do this.  Goes into great length describing her ongoing challenges with reflux. Had yet another procedure. It helped reflux for a while but now is returned with a vengeance. Patient states she is on 4 different medicines for this   Review of Systems  HENT: Positive for congestion, ear pain and sore throat.   Respiratory: Positive for cough.   Skin: Positive for rash.       Objective:   Physical Exam Alert vital stable talkative about her condition no apparent distress lungs clear occasional cough heart rare rhythm HET Mondays congestion thighs discrete papules with some excoriation similar on her perivaginal region and perirectal region.  No acute abdominal tenderness     Assessment & Plan:  Impression chronic rash potentially lichen planus discussed #2 subacute rhinosinusitis/bronchitis #3 worsening reflux discussed at length plan 25 minutes spent most in discussion. Clobetasol cream 0.05% daily to affected area for up to 2 weeks. Then back off. Amoxicillin 3 times a day 10 days. Reflux discussed

## 2016-03-01 DIAGNOSIS — N9089 Other specified noninflammatory disorders of vulva and perineum: Secondary | ICD-10-CM | POA: Diagnosis not present

## 2016-03-01 DIAGNOSIS — N760 Acute vaginitis: Secondary | ICD-10-CM | POA: Diagnosis not present

## 2016-03-01 DIAGNOSIS — R87612 Low grade squamous intraepithelial lesion on cytologic smear of cervix (LGSIL): Secondary | ICD-10-CM | POA: Diagnosis not present

## 2016-03-01 DIAGNOSIS — R87810 Cervical high risk human papillomavirus (HPV) DNA test positive: Secondary | ICD-10-CM | POA: Diagnosis not present

## 2016-03-01 DIAGNOSIS — Z01812 Encounter for preprocedural laboratory examination: Secondary | ICD-10-CM | POA: Diagnosis not present

## 2016-03-01 DIAGNOSIS — B9689 Other specified bacterial agents as the cause of diseases classified elsewhere: Secondary | ICD-10-CM | POA: Diagnosis not present

## 2016-03-01 DIAGNOSIS — Z113 Encounter for screening for infections with a predominantly sexual mode of transmission: Secondary | ICD-10-CM | POA: Diagnosis not present

## 2016-03-06 DIAGNOSIS — F4310 Post-traumatic stress disorder, unspecified: Secondary | ICD-10-CM | POA: Diagnosis not present

## 2016-03-06 DIAGNOSIS — F322 Major depressive disorder, single episode, severe without psychotic features: Secondary | ICD-10-CM | POA: Diagnosis not present

## 2016-03-09 ENCOUNTER — Other Ambulatory Visit: Payer: Self-pay | Admitting: Nurse Practitioner

## 2016-03-09 MED ORDER — METRONIDAZOLE 0.75 % VA GEL: 1.0000 | Freq: Every day | VAGINAL | 0 refills | Status: DC

## 2016-03-13 ENCOUNTER — Encounter: Payer: Self-pay | Admitting: Obstetrics & Gynecology

## 2016-03-13 ENCOUNTER — Ambulatory Visit (INDEPENDENT_AMBULATORY_CARE_PROVIDER_SITE_OTHER): Payer: Medicare Other | Admitting: Obstetrics & Gynecology

## 2016-03-13 VITALS — BP 120/80 | HR 80 | Ht 64.0 in | Wt 182.3 lb

## 2016-03-13 DIAGNOSIS — R102 Pelvic and perineal pain: Secondary | ICD-10-CM | POA: Diagnosis not present

## 2016-03-13 DIAGNOSIS — Z975 Presence of (intrauterine) contraceptive device: Secondary | ICD-10-CM

## 2016-03-13 DIAGNOSIS — N760 Acute vaginitis: Secondary | ICD-10-CM

## 2016-03-13 DIAGNOSIS — B9689 Other specified bacterial agents as the cause of diseases classified elsewhere: Secondary | ICD-10-CM
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
## 2016-03-14 ENCOUNTER — Encounter: Payer: Self-pay | Admitting: Obstetrics & Gynecology

## 2016-03-17 ENCOUNTER — Telehealth: Payer: Self-pay | Admitting: Nurse Practitioner

## 2016-03-17 MED ORDER — CIPROFLOXACIN HCL 500 MG PO TABS: 500.0000 mg | ORAL_TABLET | Freq: Two times a day (BID) | ORAL | 0 refills | Status: DC
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
## 2016-03-19 ENCOUNTER — Other Ambulatory Visit: Payer: Self-pay | Admitting: Nurse Practitioner

## 2016-03-19 ENCOUNTER — Ambulatory Visit (INDEPENDENT_AMBULATORY_CARE_PROVIDER_SITE_OTHER): Payer: Medicare Other | Admitting: Nurse Practitioner

## 2016-03-19 ENCOUNTER — Telehealth: Payer: Self-pay | Admitting: Nurse Practitioner

## 2016-03-19 VITALS — BP 138/96 | Temp 97.9°F | Ht 64.0 in | Wt 183.0 lb

## 2016-03-19 DIAGNOSIS — Z79899 Other long term (current) drug therapy: Secondary | ICD-10-CM

## 2016-03-19 DIAGNOSIS — E119 Type 2 diabetes mellitus without complications: Secondary | ICD-10-CM

## 2016-03-19 DIAGNOSIS — F411 Generalized anxiety disorder: Secondary | ICD-10-CM | POA: Diagnosis not present

## 2016-03-19 DIAGNOSIS — E876 Hypokalemia: Secondary | ICD-10-CM

## 2016-03-19 DIAGNOSIS — I10 Essential (primary) hypertension: Secondary | ICD-10-CM

## 2016-03-19 DIAGNOSIS — E7849 Other hyperlipidemia: Secondary | ICD-10-CM

## 2016-03-19 DIAGNOSIS — Z113 Encounter for screening for infections with a predominantly sexual mode of transmission: Secondary | ICD-10-CM | POA: Diagnosis not present

## 2016-03-19 DIAGNOSIS — R5382 Chronic fatigue, unspecified: Secondary | ICD-10-CM

## 2016-03-19 DIAGNOSIS — R300 Dysuria: Secondary | ICD-10-CM

## 2016-03-19 DIAGNOSIS — Z202 Contact with and (suspected) exposure to infections with a predominantly sexual mode of transmission: Secondary | ICD-10-CM | POA: Diagnosis not present

## 2016-03-19 DIAGNOSIS — E559 Vitamin D deficiency, unspecified: Secondary | ICD-10-CM

## 2016-03-19 LAB — POCT URINALYSIS DIPSTICK
Spec Grav, UA: >=1.03
pH, UA: 5

## 2016-03-19 MED ORDER — ALPRAZOLAM 1 MG PO TABS: 1.0000 mg | ORAL_TABLET | Freq: Two times a day (BID) | ORAL | 2 refills | Status: DC | PRN

## 2016-03-19 NOTE — Telephone Encounter (Signed)
Requesting order for routine blood work.

## 2016-03-19 NOTE — Telephone Encounter (Signed)
Will order.

## 2016-03-20 ENCOUNTER — Encounter: Payer: Self-pay | Admitting: Nurse Practitioner
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
## 2016-03-21 LAB — CHLAMYDIA/GONOCOCCUS/TRICHOMONAS, NAA
Chlamydia by NAA: NEGATIVE
Gonococcus by NAA: NEGATIVE
Trich vag by NAA: NEGATIVE

## 2016-03-21 LAB — PLEASE NOTE

## 2016-03-23 ENCOUNTER — Telehealth: Payer: Self-pay | Admitting: Nurse Practitioner

## 2016-03-23 NOTE — Telephone Encounter (Signed)
Called [facility] and had results to GC/CT faxed over the lab was negative for Chlamydia, Gonococcus was negative and Trich was negative. Lab report will be on your desk.

## 2016-03-24 ENCOUNTER — Other Ambulatory Visit: Payer: Self-pay | Admitting: Nurse Practitioner

## 2016-03-24 MED ORDER — DOXYCYCLINE HYCLATE 100 MG PO TABS: 100.0000 mg | ORAL_TABLET | Freq: Two times a day (BID) | ORAL | 0 refills | Status: DC
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
## 2016-03-24 NOTE — Telephone Encounter (Signed)
Thanks. Patient has been notified. See notes in chart.

## 2016-03-27 ENCOUNTER — Other Ambulatory Visit: Payer: Self-pay | Admitting: Nurse Practitioner

## 2016-03-27 ENCOUNTER — Other Ambulatory Visit: Payer: Self-pay | Admitting: *Deleted

## 2016-03-27 ENCOUNTER — Ambulatory Visit (INDEPENDENT_AMBULATORY_CARE_PROVIDER_SITE_OTHER): Payer: Medicare Other

## 2016-03-27 ENCOUNTER — Ambulatory Visit (INDEPENDENT_AMBULATORY_CARE_PROVIDER_SITE_OTHER): Payer: Medicare Other | Admitting: Obstetrics & Gynecology

## 2016-03-27 ENCOUNTER — Encounter: Payer: Self-pay | Admitting: Obstetrics & Gynecology

## 2016-03-27 VITALS — BP 120/80 | HR 72 | Ht 64.0 in | Wt 183.0 lb

## 2016-03-27 DIAGNOSIS — B9689 Other specified bacterial agents as the cause of diseases classified elsewhere: Secondary | ICD-10-CM

## 2016-03-27 DIAGNOSIS — N854 Malposition of uterus: Secondary | ICD-10-CM

## 2016-03-27 DIAGNOSIS — N3010 Interstitial cystitis (chronic) without hematuria: Secondary | ICD-10-CM | POA: Diagnosis not present

## 2016-03-27 DIAGNOSIS — R102 Pelvic and perineal pain: Secondary | ICD-10-CM | POA: Diagnosis not present

## 2016-03-27 DIAGNOSIS — N83201 Unspecified ovarian cyst, right side: Secondary | ICD-10-CM | POA: Diagnosis not present

## 2016-03-27 DIAGNOSIS — Z975 Presence of (intrauterine) contraceptive device: Secondary | ICD-10-CM | POA: Diagnosis not present

## 2016-03-27 DIAGNOSIS — N83291 Other ovarian cyst, right side: Secondary | ICD-10-CM

## 2016-03-27 DIAGNOSIS — N760 Acute vaginitis: Secondary | ICD-10-CM

## 2016-03-27 DIAGNOSIS — R109 Unspecified abdominal pain: Secondary | ICD-10-CM | POA: Diagnosis not present

## 2016-03-27 MED ORDER — URIBEL 118 MG PO CAPS: 1.0000 | ORAL_CAPSULE | Freq: Three times a day (TID) | ORAL | 1 refills | Status: DC

## 2016-03-27 NOTE — Progress Notes (Signed)
PELVIC US TA/TV: Homogeneous anteverted uterus,wnl,IUD is centrally located within the endometrium,EEC 2 mm,normal left ov,simple cyst right ovary 3 x 2.8 x 2.6 cm,no free fluid,no pain during ultrasound,ov's appear mobile

## 2016-03-28 DIAGNOSIS — K5900 Constipation, unspecified: Secondary | ICD-10-CM | POA: Diagnosis not present

## 2016-03-28 DIAGNOSIS — Z7901 Long term (current) use of anticoagulants: Secondary | ICD-10-CM | POA: Diagnosis not present

## 2016-03-29 LAB — URINE CULTURE: Organism ID, Bacteria: NO GROWTH

## 2016-03-31 DIAGNOSIS — E1122 Type 2 diabetes mellitus with diabetic chronic kidney disease: Secondary | ICD-10-CM | POA: Diagnosis not present

## 2016-03-31 DIAGNOSIS — K5909 Other constipation: Secondary | ICD-10-CM | POA: Diagnosis not present

## 2016-03-31 DIAGNOSIS — R319 Hematuria, unspecified: Secondary | ICD-10-CM | POA: Diagnosis not present

## 2016-03-31 DIAGNOSIS — N83291 Other ovarian cyst, right side: Secondary | ICD-10-CM | POA: Diagnosis not present

## 2016-03-31 DIAGNOSIS — Z888 Allergy status to other drugs, medicaments and biological substances status: Secondary | ICD-10-CM | POA: Diagnosis not present

## 2016-03-31 DIAGNOSIS — Z885 Allergy status to narcotic agent status: Secondary | ICD-10-CM | POA: Diagnosis not present

## 2016-03-31 DIAGNOSIS — G894 Chronic pain syndrome: Secondary | ICD-10-CM | POA: Diagnosis not present

## 2016-03-31 DIAGNOSIS — K219 Gastro-esophageal reflux disease without esophagitis: Secondary | ICD-10-CM | POA: Diagnosis not present

## 2016-03-31 DIAGNOSIS — I82509 Chronic embolism and thrombosis of unspecified deep veins of unspecified lower extremity: Secondary | ICD-10-CM | POA: Diagnosis not present

## 2016-03-31 DIAGNOSIS — Z9109 Other allergy status, other than to drugs and biological substances: Secondary | ICD-10-CM | POA: Diagnosis not present

## 2016-03-31 DIAGNOSIS — Z91048 Other nonmedicinal substance allergy status: Secondary | ICD-10-CM | POA: Diagnosis not present

## 2016-03-31 DIAGNOSIS — Z881 Allergy status to other antibiotic agents status: Secondary | ICD-10-CM | POA: Diagnosis not present

## 2016-03-31 DIAGNOSIS — K5900 Constipation, unspecified: Secondary | ICD-10-CM | POA: Diagnosis not present

## 2016-03-31 DIAGNOSIS — N189 Chronic kidney disease, unspecified: Secondary | ICD-10-CM | POA: Diagnosis not present

## 2016-03-31 DIAGNOSIS — I129 Hypertensive chronic kidney disease with stage 1 through stage 4 chronic kidney disease, or unspecified chronic kidney disease: Secondary | ICD-10-CM | POA: Diagnosis not present

## 2016-03-31 DIAGNOSIS — R339 Retention of urine, unspecified: Secondary | ICD-10-CM | POA: Diagnosis not present

## 2016-03-31 DIAGNOSIS — E785 Hyperlipidemia, unspecified: Secondary | ICD-10-CM | POA: Diagnosis not present

## 2016-03-31 DIAGNOSIS — Z87442 Personal history of urinary calculi: Secondary | ICD-10-CM | POA: Diagnosis not present

## 2016-03-31 DIAGNOSIS — Z9884 Bariatric surgery status: Secondary | ICD-10-CM | POA: Diagnosis not present

## 2016-03-31 DIAGNOSIS — I82409 Acute embolism and thrombosis of unspecified deep veins of unspecified lower extremity: Secondary | ICD-10-CM | POA: Diagnosis not present

## 2016-03-31 DIAGNOSIS — Z9889 Other specified postprocedural states: Secondary | ICD-10-CM | POA: Diagnosis not present

## 2016-03-31 DIAGNOSIS — F329 Major depressive disorder, single episode, unspecified: Secondary | ICD-10-CM | POA: Diagnosis not present

## 2016-03-31 DIAGNOSIS — F419 Anxiety disorder, unspecified: Secondary | ICD-10-CM | POA: Diagnosis not present

## 2016-03-31 DIAGNOSIS — Z7984 Long term (current) use of oral hypoglycemic drugs: Secondary | ICD-10-CM | POA: Diagnosis not present

## 2016-03-31 DIAGNOSIS — Z9049 Acquired absence of other specified parts of digestive tract: Secondary | ICD-10-CM | POA: Diagnosis not present

## 2016-03-31 DIAGNOSIS — Z7901 Long term (current) use of anticoagulants: Secondary | ICD-10-CM | POA: Diagnosis not present

## 2016-04-01 DIAGNOSIS — M549 Dorsalgia, unspecified: Secondary | ICD-10-CM | POA: Diagnosis not present

## 2016-04-01 DIAGNOSIS — Z7901 Long term (current) use of anticoagulants: Secondary | ICD-10-CM | POA: Diagnosis not present

## 2016-04-01 DIAGNOSIS — K911 Postgastric surgery syndromes: Secondary | ICD-10-CM | POA: Diagnosis not present

## 2016-04-01 DIAGNOSIS — E785 Hyperlipidemia, unspecified: Secondary | ICD-10-CM | POA: Diagnosis not present

## 2016-04-01 DIAGNOSIS — R319 Hematuria, unspecified: Secondary | ICD-10-CM | POA: Diagnosis not present

## 2016-04-01 DIAGNOSIS — I82509 Chronic embolism and thrombosis of unspecified deep veins of unspecified lower extremity: Secondary | ICD-10-CM | POA: Diagnosis not present

## 2016-04-01 DIAGNOSIS — K5900 Constipation, unspecified: Secondary | ICD-10-CM | POA: Diagnosis not present

## 2016-04-01 DIAGNOSIS — R1031 Right lower quadrant pain: Secondary | ICD-10-CM | POA: Diagnosis not present

## 2016-04-01 DIAGNOSIS — Z9884 Bariatric surgery status: Secondary | ICD-10-CM | POA: Diagnosis not present

## 2016-04-02 DIAGNOSIS — R319 Hematuria, unspecified: Secondary | ICD-10-CM | POA: Diagnosis not present

## 2016-04-02 DIAGNOSIS — R1084 Generalized abdominal pain: Secondary | ICD-10-CM | POA: Diagnosis not present

## 2016-04-02 DIAGNOSIS — Z7901 Long term (current) use of anticoagulants: Secondary | ICD-10-CM | POA: Diagnosis not present

## 2016-04-02 DIAGNOSIS — F329 Major depressive disorder, single episode, unspecified: Secondary | ICD-10-CM | POA: Diagnosis not present

## 2016-04-02 DIAGNOSIS — R1013 Epigastric pain: Secondary | ICD-10-CM | POA: Diagnosis not present

## 2016-04-02 DIAGNOSIS — K5900 Constipation, unspecified: Secondary | ICD-10-CM | POA: Diagnosis not present

## 2016-04-02 DIAGNOSIS — R1031 Right lower quadrant pain: Secondary | ICD-10-CM | POA: Diagnosis not present

## 2016-04-02 DIAGNOSIS — Z9884 Bariatric surgery status: Secondary | ICD-10-CM | POA: Diagnosis not present

## 2016-04-02 DIAGNOSIS — E785 Hyperlipidemia, unspecified: Secondary | ICD-10-CM | POA: Diagnosis not present

## 2016-04-02 DIAGNOSIS — N83201 Unspecified ovarian cyst, right side: Secondary | ICD-10-CM | POA: Diagnosis not present

## 2016-04-02 DIAGNOSIS — M549 Dorsalgia, unspecified: Secondary | ICD-10-CM | POA: Diagnosis not present

## 2016-04-02 DIAGNOSIS — K911 Postgastric surgery syndromes: Secondary | ICD-10-CM | POA: Diagnosis not present

## 2016-04-02 DIAGNOSIS — I82509 Chronic embolism and thrombosis of unspecified deep veins of unspecified lower extremity: Secondary | ICD-10-CM | POA: Diagnosis not present

## 2016-04-03 ENCOUNTER — Encounter: Payer: Medicare Other | Admitting: Obstetrics & Gynecology

## 2016-04-03 DIAGNOSIS — F419 Anxiety disorder, unspecified: Secondary | ICD-10-CM | POA: Diagnosis not present

## 2016-04-03 DIAGNOSIS — E785 Hyperlipidemia, unspecified: Secondary | ICD-10-CM | POA: Diagnosis not present

## 2016-04-03 DIAGNOSIS — R339 Retention of urine, unspecified: Secondary | ICD-10-CM | POA: Diagnosis not present

## 2016-04-03 DIAGNOSIS — Z86718 Personal history of other venous thrombosis and embolism: Secondary | ICD-10-CM | POA: Diagnosis not present

## 2016-04-03 DIAGNOSIS — F329 Major depressive disorder, single episode, unspecified: Secondary | ICD-10-CM | POA: Diagnosis not present

## 2016-04-03 DIAGNOSIS — K219 Gastro-esophageal reflux disease without esophagitis: Secondary | ICD-10-CM | POA: Diagnosis not present

## 2016-04-03 DIAGNOSIS — Z7901 Long term (current) use of anticoagulants: Secondary | ICD-10-CM | POA: Diagnosis not present

## 2016-04-03 DIAGNOSIS — G894 Chronic pain syndrome: Secondary | ICD-10-CM | POA: Diagnosis not present

## 2016-04-03 DIAGNOSIS — K5909 Other constipation: Secondary | ICD-10-CM | POA: Diagnosis not present

## 2016-04-03 DIAGNOSIS — Z9884 Bariatric surgery status: Secondary | ICD-10-CM | POA: Diagnosis not present

## 2016-04-04 DIAGNOSIS — K5900 Constipation, unspecified: Secondary | ICD-10-CM | POA: Diagnosis not present

## 2016-04-04 DIAGNOSIS — R338 Other retention of urine: Secondary | ICD-10-CM | POA: Diagnosis not present

## 2016-04-06 ENCOUNTER — Telehealth: Payer: Self-pay | Admitting: Family Medicine

## 2016-04-06 NOTE — Telephone Encounter (Signed)
Swelling in fingers, toes and ankles. Started last night. No shortness of breath

## 2016-04-06 NOTE — Telephone Encounter (Signed)
This needs to be seen if we are going to be prescribing something for

## 2016-04-06 NOTE — Telephone Encounter (Signed)
Spoke with patient and informed her per Dr.Scott Luking- This needs to be seen if we are going to be prescribing something for it. Patient verbalized understanding.

## 2016-04-06 NOTE — Telephone Encounter (Signed)
Pt was in the hosp from [facility] through tues for bowel issues. Pt is experiencing swelling in her fingers and ankles. Pt is wanting to know if a fluid pill can be called in. Please advise.    [facility]

## 2016-04-09 DIAGNOSIS — R339 Retention of urine, unspecified: Secondary | ICD-10-CM | POA: Diagnosis not present

## 2016-04-12 ENCOUNTER — Other Ambulatory Visit: Payer: Self-pay | Admitting: Obstetrics & Gynecology

## 2016-04-13 ENCOUNTER — Encounter: Payer: Medicare Other | Admitting: Obstetrics & Gynecology

## 2016-04-13 ENCOUNTER — Ambulatory Visit (INDEPENDENT_AMBULATORY_CARE_PROVIDER_SITE_OTHER): Payer: Medicare Other | Admitting: Obstetrics & Gynecology

## 2016-04-13 ENCOUNTER — Encounter: Payer: Self-pay | Admitting: Obstetrics & Gynecology

## 2016-04-13 VITALS — BP 122/90 | HR 80 | Wt 180.0 lb

## 2016-04-13 DIAGNOSIS — N3010 Interstitial cystitis (chronic) without hematuria: Secondary | ICD-10-CM | POA: Diagnosis not present

## 2016-04-13 DIAGNOSIS — R102 Pelvic and perineal pain: Secondary | ICD-10-CM
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
## 2016-04-18 DIAGNOSIS — K5900 Constipation, unspecified: Secondary | ICD-10-CM | POA: Diagnosis not present

## 2016-04-18 DIAGNOSIS — K6289 Other specified diseases of anus and rectum: Secondary | ICD-10-CM | POA: Diagnosis not present

## 2016-04-19 DIAGNOSIS — R339 Retention of urine, unspecified: Secondary | ICD-10-CM | POA: Diagnosis not present

## 2016-04-20 DIAGNOSIS — Z79899 Other long term (current) drug therapy: Secondary | ICD-10-CM | POA: Diagnosis not present

## 2016-04-20 DIAGNOSIS — I10 Essential (primary) hypertension: Secondary | ICD-10-CM | POA: Diagnosis not present

## 2016-04-20 DIAGNOSIS — E119 Type 2 diabetes mellitus without complications: Secondary | ICD-10-CM | POA: Diagnosis not present

## 2016-04-20 DIAGNOSIS — E559 Vitamin D deficiency, unspecified: Secondary | ICD-10-CM | POA: Diagnosis not present

## 2016-04-20 DIAGNOSIS — R5382 Chronic fatigue, unspecified: Secondary | ICD-10-CM | POA: Diagnosis not present

## 2016-04-20 DIAGNOSIS — E876 Hypokalemia: Secondary | ICD-10-CM | POA: Diagnosis not present

## 2016-04-20 DIAGNOSIS — E784 Other hyperlipidemia: Secondary | ICD-10-CM | POA: Diagnosis not present

## 2016-04-21 LAB — BASIC METABOLIC PANEL
BUN/Creatinine Ratio: 6 — ABNORMAL LOW (ref 9–23)
BUN: 6 mg/dL (ref 6–20)
CO2: 22 mmol/L (ref 18–29)
CREATININE: 0.95 mg/dL (ref 0.57–1.00)
Calcium: 9.4 mg/dL (ref 8.7–10.2)
Chloride: 103 mmol/L (ref 96–106)
GFR calc Af Amer: 90 mL/min/{1.73_m2} (ref >59)
GFR, EST NON AFRICAN AMERICAN: 78 mL/min/{1.73_m2} (ref >59)
GLUCOSE: 110 mg/dL — AB (ref 65–99)
Potassium: 4 mmol/L (ref 3.5–5.2)
Sodium: 141 mmol/L (ref 134–144)

## 2016-04-21 LAB — HEMOGLOBIN A1C
ESTIMATED AVERAGE GLUCOSE: 97 mg/dL
HEMOGLOBIN A1C: 5 % (ref 4.8–5.6)

## 2016-04-21 LAB — HEPATIC FUNCTION PANEL
ALBUMIN: 4.4 g/dL (ref 3.5–5.5)
ALT: 14 IU/L (ref 0–32)
AST: 13 IU/L (ref 0–40)
Alkaline Phosphatase: 76 IU/L (ref 39–117)
BILIRUBIN TOTAL: 0.5 mg/dL (ref 0.0–1.2)
Bilirubin, Direct: 0.14 mg/dL (ref 0.00–0.40)
Total Protein: 6.6 g/dL (ref 6.0–8.5)

## 2016-04-21 LAB — LIPID PANEL
CHOL/HDL RATIO: 2.8 ratio (ref 0.0–4.4)
Cholesterol, Total: 179 mg/dL (ref 100–199)
HDL: 64 mg/dL (ref >39)
LDL CALC: 91 mg/dL (ref 0–99)
Triglycerides: 120 mg/dL (ref 0–149)
VLDL CHOLESTEROL CAL: 24 mg/dL (ref 5–40)

## 2016-04-21 LAB — VITAMIN D 25 HYDROXY (VIT D DEFICIENCY, FRACTURES): VIT D 25 HYDROXY: 66.4 ng/mL (ref 30.0–100.0)

## 2016-04-21 LAB — MICROALBUMIN, URINE: Microalbumin, Urine: 8.4 ug/mL

## 2016-04-24 DIAGNOSIS — R8290 Unspecified abnormal findings in urine: Secondary | ICD-10-CM | POA: Diagnosis not present

## 2016-04-24 DIAGNOSIS — N3010 Interstitial cystitis (chronic) without hematuria: Secondary | ICD-10-CM | POA: Diagnosis not present

## 2016-05-07 ENCOUNTER — Ambulatory Visit: Payer: Medicare Other | Admitting: Family Medicine

## 2016-05-07 ENCOUNTER — Ambulatory Visit (INDEPENDENT_AMBULATORY_CARE_PROVIDER_SITE_OTHER): Payer: Medicare Other | Admitting: Nurse Practitioner

## 2016-05-07 ENCOUNTER — Encounter: Payer: Self-pay | Admitting: Nurse Practitioner

## 2016-05-07 VITALS — BP 122/82 | Ht 64.0 in | Wt 186.8 lb

## 2016-05-07 DIAGNOSIS — I10 Essential (primary) hypertension: Secondary | ICD-10-CM

## 2016-05-07 DIAGNOSIS — N3010 Interstitial cystitis (chronic) without hematuria: Secondary | ICD-10-CM | POA: Insufficient documentation

## 2016-05-07 MED ORDER — HYOSCYAMINE SULFATE SL 0.125 MG SL SUBL: SUBLINGUAL_TABLET | SUBLINGUAL | 0 refills | Status: DC

## 2016-05-07 NOTE — Progress Notes (Signed)
Subjective:  Presents for routine follow-up. Also to review her recent lab work. Was recently diagnosed with interstitial cystitis. Was prescribed Uribel by gynecologist for [place] worked worked very well but her insurance will not cover. Currently on Elmiron and daily antihistamine with minimal relief. Has been off her Aldactone for PCOS and has noticed increase swelling in extremities. Has routine follow-up with multiple specialists.   Objective:   BP 122/82   Ht 5\' 4"  (1.626 m)   Wt 186 lb 12.8 oz (84.7 kg)   BMI 32.06 kg/m  NAD. Alert, oriented. Lungs clear. Heart regular rate rhythm. Reviewed labs with patient from 04/20/2016.  Assessment:  Problem List Items Addressed This Visit      Cardiovascular and Mediastinum   Essential hypertension - Primary    Other Visit Diagnoses    Interstitial cystitis         Plan:  Meds ordered this encounter  Medications  . Hyoscyamine Sulfate SL (LEVSIN/SL) 0.125 MG SUBL    Sig: One SL q 4 hours prn spasms    Dispense:  40 each    Refill:  0    Order Specific Question:   Supervising Provider    Answer:   Maggie Font   Continue current regimen. Start Levsin which is one of the ingredients in [place]. Restart Aldactone as directed. Otherwise continue other medications as directed. Follow up with multiple specialists as planned.   Return in about 6 months (around 11/05/2016) for recheck.

## 2016-05-09 ENCOUNTER — Telehealth: Payer: Self-pay | Admitting: Nurse Practitioner

## 2016-05-09 NOTE — Telephone Encounter (Signed)
Received fax from patient's pharmacy. Hyoscyamine is a non preferred drug. Please see form on your desk.

## 2016-05-11 NOTE — Telephone Encounter (Signed)
Form completed. Patient notified. Now waiting on insurance response.

## 2016-05-14 ENCOUNTER — Other Ambulatory Visit: Payer: Self-pay | Admitting: *Deleted

## 2016-05-14 MED ORDER — HYOSCYAMINE SULFATE SL 0.125 MG SL SUBL: SUBLINGUAL_TABLET | SUBLINGUAL | 0 refills | Status: DC

## 2016-05-15 ENCOUNTER — Telehealth: Payer: Self-pay

## 2016-05-15 NOTE — Telephone Encounter (Signed)
Hyoscyamine 0.125 mg tab SL was approved by insurance. Authorization is good until 06/03/2017.   Member ID- [id number]

## 2016-05-16 NOTE — Telephone Encounter (Signed)
Med was approved; patient aware

## 2016-05-17 DIAGNOSIS — Z86718 Personal history of other venous thrombosis and embolism: Secondary | ICD-10-CM | POA: Diagnosis not present

## 2016-05-17 DIAGNOSIS — R799 Abnormal finding of blood chemistry, unspecified: Secondary | ICD-10-CM | POA: Diagnosis not present

## 2016-05-17 DIAGNOSIS — R278 Other lack of coordination: Secondary | ICD-10-CM | POA: Diagnosis not present

## 2016-05-17 DIAGNOSIS — K5900 Constipation, unspecified: Secondary | ICD-10-CM | POA: Diagnosis not present

## 2016-05-17 DIAGNOSIS — Z79899 Other long term (current) drug therapy: Secondary | ICD-10-CM | POA: Diagnosis not present

## 2016-05-18 ENCOUNTER — Encounter: Payer: Self-pay | Admitting: Family Medicine

## 2016-05-18 ENCOUNTER — Ambulatory Visit (INDEPENDENT_AMBULATORY_CARE_PROVIDER_SITE_OTHER): Payer: Medicare Other | Admitting: Family Medicine

## 2016-05-18 VITALS — BP 102/80 | Temp 98.2°F | Ht 64.0 in | Wt 187.4 lb

## 2016-05-18 DIAGNOSIS — B9689 Other specified bacterial agents as the cause of diseases classified elsewhere: Secondary | ICD-10-CM | POA: Diagnosis not present

## 2016-05-18 DIAGNOSIS — J4521 Mild intermittent asthma with (acute) exacerbation: Secondary | ICD-10-CM | POA: Diagnosis not present

## 2016-05-18 DIAGNOSIS — J019 Acute sinusitis, unspecified: Secondary | ICD-10-CM

## 2016-05-18 MED ORDER — ALBUTEROL SULFATE HFA 108 (90 BASE) MCG/ACT IN AERS: 2.0000 | INHALATION_SPRAY | Freq: Four times a day (QID) | RESPIRATORY_TRACT | 2 refills | Status: DC | PRN

## 2016-05-18 MED ORDER — AMOXICILLIN 500 MG PO CAPS: 500.0000 mg | ORAL_CAPSULE | Freq: Three times a day (TID) | ORAL | 0 refills | Status: DC

## 2016-05-18 MED ORDER — ALBUTEROL SULFATE (2.5 MG/3ML) 0.083% IN NEBU: INHALATION_SOLUTION | RESPIRATORY_TRACT | 2 refills | Status: DC

## 2016-05-18 NOTE — Progress Notes (Signed)
Subjective:    Patient ID: [name], female    DOB: Mar 18, 1981, 35 y.o.   MRN: [id number]  Cough  This is a new problem. The current episode started in the past 7 days. The problem has been unchanged. The cough is non-productive. Associated symptoms include wheezing. Nothing aggravates the symptoms. Treatments tried: mucinex, cough medicine. The treatment provided no relief.   Pos use of the inhaler  Cough and cong  Started with aspiration   Pos porductive and gunky      Review of Systems  Respiratory: Positive for cough and wheezing.        Objective:   Physical Exam  Alert, mild malaise. Hydration good Vitals stable. frontal/ maxillary tenderness evident positive nasal congestion. pharynx normal neck supple  lungs clear/no cracklesWith deep breaths mild wheezes. heart regular in rhythm       Assessment & Plan:  Impression rhinosinusitis plus bronchitis with element of reactive airway likely post viral, discussed with patient. plan antibiotics prescribed. Questions answered. Symptomatic care discussed. warning signs discussed. WSL

## 2016-05-20 ENCOUNTER — Other Ambulatory Visit: Payer: Self-pay | Admitting: Family Medicine

## 2016-05-21 ENCOUNTER — Telehealth: Payer: Self-pay | Admitting: Family Medicine

## 2016-05-21 ENCOUNTER — Other Ambulatory Visit: Payer: Self-pay | Admitting: Nurse Practitioner

## 2016-05-21 ENCOUNTER — Telehealth: Payer: Self-pay | Admitting: *Deleted

## 2016-05-21 MED ORDER — BENZONATATE 100 MG PO CAPS: 100.0000 mg | ORAL_CAPSULE | Freq: Three times a day (TID) | ORAL | 0 refills | Status: DC | PRN

## 2016-05-21 MED ORDER — CLARITHROMYCIN 500 MG PO TABS: 500.0000 mg | ORAL_TABLET | Freq: Two times a day (BID) | ORAL | 0 refills | Status: DC

## 2016-05-21 MED ORDER — AMOXICILLIN-POT CLAVULANATE 875-125 MG PO TABS: 1.0000 | ORAL_TABLET | Freq: Two times a day (BID) | ORAL | 0 refills | Status: DC

## 2016-05-21 MED ORDER — ALBUTEROL SULFATE (2.5 MG/3ML) 0.083% IN NEBU: INHALATION_SOLUTION | RESPIRATORY_TRACT | 0 refills | Status: DC

## 2016-05-21 MED ORDER — HYOSCYAMINE SULFATE SL 0.125 MG SL SUBL: SUBLINGUAL_TABLET | SUBLINGUAL | 3 refills | Status: DC

## 2016-05-21 MED ORDER — PREDNISONE 20 MG PO TABS: ORAL_TABLET | ORAL | 0 refills | Status: DC

## 2016-05-21 NOTE — Telephone Encounter (Signed)
Pharmacy called to state drug interaction between biaxin and lyrica  Need new antibiotic called into [facility].

## 2016-05-21 NOTE — Telephone Encounter (Signed)
Augmentin sent electronically to pharmacy

## 2016-05-21 NOTE — Telephone Encounter (Signed)
See phone message cancel cefzil with keflex allergy biaxin 500 bid tend days instead, refill hycosamine times three   Documentation

## 2016-05-21 NOTE — Telephone Encounter (Signed)
Yes autumn sorry see note under med request sent to me regarding the hycosamine rx, sorry for this being on two different responses

## 2016-05-21 NOTE — Telephone Encounter (Signed)
Aug 875 bid ten d 

## 2016-05-21 NOTE — Telephone Encounter (Signed)
Med sent in.

## 2016-05-21 NOTE — Telephone Encounter (Signed)
Prescriptions sent electronically to pharmacy. Neb supplies called to [facility]. Patient notified.

## 2016-05-21 NOTE — Telephone Encounter (Signed)
cefzil 500 bid ten d, adult pred taper, may refill neb

## 2016-05-21 NOTE — Telephone Encounter (Signed)
Patient given amoxil 12/15

## 2016-05-21 NOTE — Telephone Encounter (Signed)
See phone message cancel cefzil with keflex allergy biaxin 500 bid tend days instead, refill hycosamine times three

## 2016-05-21 NOTE — Addendum Note (Signed)
Addended by: Jesusita Oka on: 05/21/2016 04:39 PM   Modules accepted: Orders

## 2016-05-21 NOTE — Telephone Encounter (Signed)
Patient was seen 12/15 and states cough and wheezing are worst and wanting something else called into [facility]. Also needing new prescription for neb supplies for machine called into [facility]

## 2016-05-22 ENCOUNTER — Other Ambulatory Visit: Payer: Self-pay | Admitting: Nurse Practitioner

## 2016-05-22 DIAGNOSIS — H02826 Cysts of left eye, unspecified eyelid: Secondary | ICD-10-CM | POA: Diagnosis not present

## 2016-05-22 DIAGNOSIS — Z48810 Encounter for surgical aftercare following surgery on the sense organs: Secondary | ICD-10-CM | POA: Diagnosis not present

## 2016-05-22 DIAGNOSIS — H04123 Dry eye syndrome of bilateral lacrimal glands: Secondary | ICD-10-CM | POA: Diagnosis not present

## 2016-05-22 MED ORDER — HYDROCODONE-HOMATROPINE 5-1.5 MG/5ML PO SYRP: 5.0000 mL | ORAL_SOLUTION | ORAL | 0 refills | Status: DC | PRN

## 2016-06-05 DIAGNOSIS — M9901 Segmental and somatic dysfunction of cervical region: Secondary | ICD-10-CM | POA: Diagnosis not present

## 2016-06-05 DIAGNOSIS — S134XXA Sprain of ligaments of cervical spine, initial encounter: Secondary | ICD-10-CM | POA: Diagnosis not present

## 2016-06-05 DIAGNOSIS — M9903 Segmental and somatic dysfunction of lumbar region: Secondary | ICD-10-CM | POA: Diagnosis not present

## 2016-06-05 DIAGNOSIS — M546 Pain in thoracic spine: Secondary | ICD-10-CM | POA: Diagnosis not present

## 2016-06-05 DIAGNOSIS — S335XXA Sprain of ligaments of lumbar spine, initial encounter: Secondary | ICD-10-CM | POA: Diagnosis not present

## 2016-06-05 DIAGNOSIS — M9902 Segmental and somatic dysfunction of thoracic region: Secondary | ICD-10-CM | POA: Diagnosis not present

## 2016-06-06 ENCOUNTER — Ambulatory Visit (INDEPENDENT_AMBULATORY_CARE_PROVIDER_SITE_OTHER): Payer: Medicare Other | Admitting: Family Medicine

## 2016-06-06 ENCOUNTER — Encounter: Payer: Self-pay | Admitting: Family Medicine

## 2016-06-06 VITALS — BP 122/80 | Temp 98.6°F | Wt 189.0 lb

## 2016-06-06 DIAGNOSIS — M545 Low back pain: Secondary | ICD-10-CM | POA: Diagnosis not present

## 2016-06-06 DIAGNOSIS — R109 Unspecified abdominal pain: Secondary | ICD-10-CM | POA: Diagnosis not present

## 2016-06-06 DIAGNOSIS — B9689 Other specified bacterial agents as the cause of diseases classified elsewhere: Secondary | ICD-10-CM | POA: Diagnosis not present

## 2016-06-06 DIAGNOSIS — J019 Acute sinusitis, unspecified: Secondary | ICD-10-CM

## 2016-06-06 LAB — POCT URINALYSIS DIPSTICK
PH UA: 5
SPEC GRAV UA: 1.02

## 2016-06-06 MED ORDER — CIPROFLOXACIN HCL 500 MG PO TABS: 500.0000 mg | ORAL_TABLET | Freq: Two times a day (BID) | ORAL | 0 refills | Status: AC

## 2016-06-06 NOTE — Addendum Note (Signed)
Addended by: Carmelina Noun on: 06/06/2016 01:30 PM   Modules accepted: Orders

## 2016-06-06 NOTE — Progress Notes (Signed)
Subjective:    Patient ID: [name], female    DOB: Feb 07, 1981, 36 y.o.   MRN: [id number]  Sinusitis  This is a new problem. Episode onset: 3 weeks. Associated symptoms include congestion, ear pain, headaches and a sore throat. (Wheezing) Treatments tried: phenergan, zofran, mucinex, pepto, 2 antibiotics.   Abdominal pain, vomiting, back pain, body aches.   Results for orders placed or performed in visit on 06/06/16  POCT urinalysis dipstick  Result Value Ref Range   Color, UA     Clarity, UA     Glucose, UA     Bilirubin, UA +    Ketones, UA     Spec Grav, UA 1.020    Blood, UA     pH, UA 5.0    Protein, UA     Urobilinogen, UA     Nitrite, UA     Leukocytes, UA Trace (A) Negative     Review of Systems  HENT: Positive for congestion, ear pain and sore throat.   Neurological: Positive for headaches.       Objective:   Physical Exam  Alert, mild malaise. Hydration good Vitals stable. frontal/ maxillary tenderness evident positive nasal congestion. pharynx normal neck supple  lungs clear/no crackles or wheezes. heart regular in rhythm Abdomen excellent bowel sounds mild epigastric and some suprapubic tenderness. Slight right CVA tenderness      Assessment & Plan:  Impression rhinosinusitis plus urinary tract infection with secondary GI symptoms plan Cipro 500 twice a day 10 days. Local measures discussed symptom care discussed urine culture

## 2016-06-07 DIAGNOSIS — M9903 Segmental and somatic dysfunction of lumbar region: Secondary | ICD-10-CM | POA: Diagnosis not present

## 2016-06-07 DIAGNOSIS — S134XXA Sprain of ligaments of cervical spine, initial encounter: Secondary | ICD-10-CM | POA: Diagnosis not present

## 2016-06-07 DIAGNOSIS — S335XXA Sprain of ligaments of lumbar spine, initial encounter: Secondary | ICD-10-CM | POA: Diagnosis not present

## 2016-06-07 DIAGNOSIS — M9901 Segmental and somatic dysfunction of cervical region: Secondary | ICD-10-CM | POA: Diagnosis not present

## 2016-06-07 DIAGNOSIS — M9902 Segmental and somatic dysfunction of thoracic region: Secondary | ICD-10-CM | POA: Diagnosis not present

## 2016-06-07 DIAGNOSIS — M546 Pain in thoracic spine: Secondary | ICD-10-CM | POA: Diagnosis not present

## 2016-06-10 ENCOUNTER — Other Ambulatory Visit: Payer: Self-pay | Admitting: Nurse Practitioner

## 2016-06-11 ENCOUNTER — Telehealth: Payer: Self-pay | Admitting: Family Medicine

## 2016-06-11 LAB — URINE CULTURE

## 2016-06-11 MED ORDER — AZITHROMYCIN 250 MG PO TABS: ORAL_TABLET | ORAL | 0 refills | Status: DC

## 2016-06-11 NOTE — Telephone Encounter (Signed)
Pt called stating that she is five days into taking the cipro and still feels like crud. Pt is wanting to know what she should do. Please advise.   [facility]

## 2016-06-11 NOTE — Telephone Encounter (Signed)
Pt grew out both enterobacter and klebsiella, both sens to cipro, should stay on cipro for further urology management needs to consult with her urologist as she has initiated. If cold is viral no abx will help, can add a z pk if pt desires but I am not really sure if it would help

## 2016-06-11 NOTE — Telephone Encounter (Signed)
I spoke with patient and advised her of Dr. Jeannine Kitten advise.  She is requesting ZPak be sent. She voiced understanding and agreed with Dr. Jeannine Kitten plan.

## 2016-06-11 NOTE — Telephone Encounter (Signed)
Ok plus two monthly ref 

## 2016-06-11 NOTE — Telephone Encounter (Signed)
Patient states UA Sx are not better, "head cold" is not better, now c/o Urine C/S in labs. Would you like to change ATB?  She does state she has placed a call to her urologist at [facility] and is waiting for return call. Please advise.

## 2016-06-14 DIAGNOSIS — L600 Ingrowing nail: Secondary | ICD-10-CM | POA: Diagnosis not present

## 2016-06-14 DIAGNOSIS — M79673 Pain in unspecified foot: Secondary | ICD-10-CM | POA: Diagnosis not present

## 2016-06-18 ENCOUNTER — Encounter: Payer: Self-pay | Admitting: Family Medicine

## 2016-06-18 ENCOUNTER — Ambulatory Visit (INDEPENDENT_AMBULATORY_CARE_PROVIDER_SITE_OTHER): Payer: Medicare Other | Admitting: Family Medicine

## 2016-06-18 VITALS — BP 118/84 | Temp 98.5°F | Ht 64.0 in | Wt 194.0 lb

## 2016-06-18 DIAGNOSIS — N390 Urinary tract infection, site not specified: Secondary | ICD-10-CM

## 2016-06-18 DIAGNOSIS — J4541 Moderate persistent asthma with (acute) exacerbation: Secondary | ICD-10-CM

## 2016-06-18 LAB — POCT URINALYSIS DIPSTICK
SPEC GRAV UA: 1.01
pH, UA: 5

## 2016-06-18 MED ORDER — PREDNISONE 20 MG PO TABS: ORAL_TABLET | ORAL | 0 refills | Status: DC

## 2016-06-18 MED ORDER — HYDROCODONE-HOMATROPINE 5-1.5 MG/5ML PO SYRP: ORAL_SOLUTION | ORAL | 0 refills | Status: DC

## 2016-06-18 MED ORDER — BECLOMETHASONE DIPROPIONATE 40 MCG/ACT IN AERS
2.0000 | INHALATION_SPRAY | Freq: Two times a day (BID) | RESPIRATORY_TRACT | 12 refills | Status: DC
Start: 2016-06-18 — End: 2016-07-05

## 2016-06-18 NOTE — Progress Notes (Signed)
Subjective:    Patient ID: [name], female    DOB: 1980-08-19, 36 y.o.   MRN: [id number]  HPI    Review of Systems     Objective:   Physical Exam        Assessment & Plan:

## 2016-06-18 NOTE — Progress Notes (Signed)
Subjective:    Patient ID: [name], female    DOB: 1980-12-10, 36 y.o.   MRN: [id number]  Sinusitis  This is a new problem. Episode onset: one month. Associated symptoms include congestion, coughing, ear pain, headaches and a sore throat. (Wheezing, vomiting) Treatments tried: cipro, cough syrup, zpack, amoxil.   Pt wanted urine rechecked. Finished antibiotic over one week ago.    Uses inhaler qfour hrs and the n two or three nebs per da  Pos reflux, some prodcutive cough    Review of Systems  HENT: Positive for congestion, ear pain and sore throat.   Respiratory: Positive for cough.   Neurological: Positive for headaches.       Objective:   Physical Exam  .Alert moderate malaise. H&T moderate nasal congestion pharynx normal lungs background wheeze heart regular rate and rhythm. Urinalysis unremarkable     Assessment & Plan:  Impression post viral exacerbation of reactive airways plan prednisone taper. Initiate Qvar rationale discussed

## 2016-06-21 ENCOUNTER — Other Ambulatory Visit: Payer: Self-pay | Admitting: Nurse Practitioner

## 2016-06-26 ENCOUNTER — Telehealth: Payer: Self-pay | Admitting: Family Medicine

## 2016-06-26 NOTE — Telephone Encounter (Signed)
Patient said that her Qvar inhaler is not on her formulary for her insurance.  She said she was told to call our office and we could call the insurance and do a prior authorization over the phone.  [employer] [facility] ph # [phone]  fax # [phone]

## 2016-06-28 ENCOUNTER — Encounter: Payer: Self-pay | Admitting: Family Medicine

## 2016-06-28 ENCOUNTER — Encounter: Payer: Self-pay | Admitting: Nurse Practitioner

## 2016-06-28 ENCOUNTER — Other Ambulatory Visit: Payer: Self-pay | Admitting: Family Medicine

## 2016-06-28 ENCOUNTER — Other Ambulatory Visit: Payer: Self-pay | Admitting: Nurse Practitioner

## 2016-06-28 DIAGNOSIS — S134XXA Sprain of ligaments of cervical spine, initial encounter: Secondary | ICD-10-CM | POA: Diagnosis not present

## 2016-06-28 DIAGNOSIS — M9902 Segmental and somatic dysfunction of thoracic region: Secondary | ICD-10-CM | POA: Diagnosis not present

## 2016-06-28 DIAGNOSIS — S335XXA Sprain of ligaments of lumbar spine, initial encounter: Secondary | ICD-10-CM | POA: Diagnosis not present

## 2016-06-28 DIAGNOSIS — M9901 Segmental and somatic dysfunction of cervical region: Secondary | ICD-10-CM | POA: Diagnosis not present

## 2016-06-28 DIAGNOSIS — B070 Plantar wart: Secondary | ICD-10-CM | POA: Diagnosis not present

## 2016-06-28 DIAGNOSIS — M79673 Pain in unspecified foot: Secondary | ICD-10-CM | POA: Diagnosis not present

## 2016-06-28 DIAGNOSIS — M9903 Segmental and somatic dysfunction of lumbar region: Secondary | ICD-10-CM | POA: Diagnosis not present

## 2016-06-28 DIAGNOSIS — M546 Pain in thoracic spine: Secondary | ICD-10-CM | POA: Diagnosis not present

## 2016-06-28 MED ORDER — HYDROCODONE-HOMATROPINE 5-1.5 MG/5ML PO SYRP: ORAL_SOLUTION | ORAL | 0 refills | Status: DC

## 2016-06-28 NOTE — Telephone Encounter (Signed)
Ok times one 

## 2016-06-28 NOTE — Telephone Encounter (Signed)
Prior authorization started thru [facility] via the phone. Pending approval

## 2016-07-02 ENCOUNTER — Encounter: Payer: Self-pay | Admitting: Nurse Practitioner

## 2016-07-02 ENCOUNTER — Ambulatory Visit (INDEPENDENT_AMBULATORY_CARE_PROVIDER_SITE_OTHER): Payer: Medicare Other | Admitting: Nurse Practitioner

## 2016-07-02 VITALS — BP 118/82 | Temp 97.9°F | Ht 64.0 in | Wt 196.6 lb

## 2016-07-02 DIAGNOSIS — F1523 Other stimulant dependence with withdrawal: Secondary | ICD-10-CM

## 2016-07-02 DIAGNOSIS — M62838 Other muscle spasm: Secondary | ICD-10-CM | POA: Diagnosis not present

## 2016-07-02 DIAGNOSIS — B370 Candidal stomatitis: Secondary | ICD-10-CM | POA: Diagnosis not present

## 2016-07-02 DIAGNOSIS — F1593 Other stimulant use, unspecified with withdrawal: Secondary | ICD-10-CM

## 2016-07-02 DIAGNOSIS — R5383 Other fatigue: Secondary | ICD-10-CM

## 2016-07-02 MED ORDER — CHLORZOXAZONE 500 MG PO TABS: 500.0000 mg | ORAL_TABLET | Freq: Three times a day (TID) | ORAL | 0 refills | Status: DC | PRN

## 2016-07-02 MED ORDER — FLUCONAZOLE 100 MG PO TABS: ORAL_TABLET | ORAL | 0 refills | Status: DC

## 2016-07-02 NOTE — Progress Notes (Signed)
Subjective:  Presents for c/o pain in the lateral neck area for the past several days. Sore throat. Has taken 3 courses of antibiotics recently. Headache. Some cough and runny nose. No wheezing. Left ear pain. Some vomiting. Has complex GI history. Will see GI specialist on 1/31. Extreme fatigue the past few days. Stopped caffeine several days ago; was drinking a large amount of caffeine daily.   Objective:   BP 118/82   Temp 97.9 F (36.6 C) (Oral)   Ht 5\' 4"  (1.626 m)   Wt 196 lb 9.6 oz (89.2 kg)   BMI 33.75 kg/m  NAD. Alert, oriented. TMs clear effusion. Pharynx non erythematous; clear PND noted. White film covering tongue. Neck supple with minimal anterior adenopathy. Very tight tender muscles noted in the lateral neck area bilat.   Assessment:  Oral candidiasis - Plan: CBC with Differential/Platelet  Muscle spasms of neck - Plan: Basic metabolic panel, Hepatic function panel  Other fatigue - Plan: CBC with Differential/Platelet, Basic metabolic panel, Hepatic function panel, TSH  Caffeine withdrawal (HCC)    Plan:  Meds ordered this encounter  Medications  . fluconazole (DIFLUCAN) 100 MG tablet    Sig: 2 po today then one po qd x 14 d    Dispense:  16 tablet    Refill:  0    Order Specific Question:   Supervising Provider    Answer:   Mikey Kirschner [[id number]]  . chlorzoxazone (PARAFON) 500 MG tablet    Sig: Take 1 tablet (500 mg total) by mouth 3 (three) times daily as needed for muscle spasms.    Dispense:  30 tablet    Refill:  0    Order Specific Question:   Supervising Provider    Answer:   Mikey Kirschner [[id number]]   Recommend massage therapy.  Continue heat applications. Explained that headache and fatigue may be due to sudden decrease in caffeine intake. Labs pending. Call back if worsens or persists.

## 2016-07-03 LAB — CBC WITH DIFFERENTIAL/PLATELET
BASOS ABS: 0 10*3/uL (ref 0.0–0.2)
Basos: 0 %
EOS (ABSOLUTE): 0.2 10*3/uL (ref 0.0–0.4)
Eos: 2 %
Hematocrit: 42 % (ref 34.0–46.6)
Hemoglobin: 13.8 g/dL (ref 11.1–15.9)
Immature Grans (Abs): 0 10*3/uL (ref 0.0–0.1)
Immature Granulocytes: 0 %
LYMPHS ABS: 2.8 10*3/uL (ref 0.7–3.1)
LYMPHS: 26 %
MCH: 30.4 pg (ref 26.6–33.0)
MCHC: 32.9 g/dL (ref 31.5–35.7)
MCV: 93 fL (ref 79–97)
Monocytes Absolute: 0.6 10*3/uL (ref 0.1–0.9)
Monocytes: 6 %
Neutrophils Absolute: 7 10*3/uL (ref 1.4–7.0)
Neutrophils: 66 %
Platelets: 249 10*3/uL (ref 150–379)
RBC: 4.54 x10E6/uL (ref 3.77–5.28)
RDW: 13.1 % (ref 12.3–15.4)
WBC: 10.6 10*3/uL (ref 3.4–10.8)

## 2016-07-03 LAB — BASIC METABOLIC PANEL
BUN/Creatinine Ratio: 10 (ref 9–23)
BUN: 8 mg/dL (ref 6–20)
CHLORIDE: 98 mmol/L (ref 96–106)
CO2: 23 mmol/L (ref 18–29)
Calcium: 10 mg/dL (ref 8.7–10.2)
Creatinine, Ser: 0.83 mg/dL (ref 0.57–1.00)
GFR calc Af Amer: 106 mL/min/{1.73_m2} (ref >59)
GFR calc non Af Amer: 92 mL/min/{1.73_m2} (ref >59)
GLUCOSE: 77 mg/dL (ref 65–99)
Potassium: 4.2 mmol/L (ref 3.5–5.2)
SODIUM: 139 mmol/L (ref 134–144)

## 2016-07-03 LAB — HEPATIC FUNCTION PANEL
ALBUMIN: 4.5 g/dL (ref 3.5–5.5)
ALT: 26 IU/L (ref 0–32)
AST: 19 IU/L (ref 0–40)
Alkaline Phosphatase: 67 IU/L (ref 39–117)
Bilirubin Total: 0.2 mg/dL (ref 0.0–1.2)
Bilirubin, Direct: 0.06 mg/dL (ref 0.00–0.40)
Total Protein: 6.6 g/dL (ref 6.0–8.5)

## 2016-07-03 LAB — TSH: TSH: 3.37 u[IU]/mL (ref 0.450–4.500)

## 2016-07-04 DIAGNOSIS — K219 Gastro-esophageal reflux disease without esophagitis: Secondary | ICD-10-CM | POA: Diagnosis not present

## 2016-07-04 DIAGNOSIS — Z9884 Bariatric surgery status: Secondary | ICD-10-CM | POA: Diagnosis not present

## 2016-07-04 DIAGNOSIS — K228 Other specified diseases of esophagus: Secondary | ICD-10-CM | POA: Diagnosis not present

## 2016-07-04 DIAGNOSIS — K224 Dyskinesia of esophagus: Secondary | ICD-10-CM | POA: Diagnosis not present

## 2016-07-05 MED ORDER — FLUTICASONE PROPIONATE HFA 110 MCG/ACT IN AERO: 2.0000 | INHALATION_SPRAY | Freq: Two times a day (BID) | RESPIRATORY_TRACT | 12 refills | Status: DC

## 2016-07-05 NOTE — Telephone Encounter (Signed)
Med sent to pharmacy. Patient was notified.  

## 2016-07-05 NOTE — Telephone Encounter (Signed)
Qvar not covered unless tried and failed formulary alternatives.   Formulary alternatives include Arnuity, Elipta, Flovent Diskus and Flovent HFA  Would you like to switch to a medication on formulary if so need prescription

## 2016-07-05 NOTE — Telephone Encounter (Signed)
bid

## 2016-07-05 NOTE — Telephone Encounter (Signed)
flovent 110 mcg two puffs id, 12 ref

## 2016-07-05 NOTE — Telephone Encounter (Signed)
BID, TID or QID?

## 2016-07-08 ENCOUNTER — Other Ambulatory Visit: Payer: Self-pay | Admitting: Nurse Practitioner

## 2016-07-09 DIAGNOSIS — N9489 Other specified conditions associated with female genital organs and menstrual cycle: Secondary | ICD-10-CM | POA: Diagnosis not present

## 2016-07-09 DIAGNOSIS — R339 Retention of urine, unspecified: Secondary | ICD-10-CM | POA: Diagnosis not present

## 2016-07-09 DIAGNOSIS — R3915 Urgency of urination: Secondary | ICD-10-CM | POA: Diagnosis not present

## 2016-07-09 DIAGNOSIS — N3941 Urge incontinence: Secondary | ICD-10-CM | POA: Diagnosis not present

## 2016-07-09 DIAGNOSIS — R278 Other lack of coordination: Secondary | ICD-10-CM | POA: Diagnosis not present

## 2016-07-09 DIAGNOSIS — K5900 Constipation, unspecified: Secondary | ICD-10-CM | POA: Diagnosis not present

## 2016-07-09 DIAGNOSIS — M6289 Other specified disorders of muscle: Secondary | ICD-10-CM | POA: Diagnosis not present

## 2016-07-10 ENCOUNTER — Encounter: Payer: Self-pay | Admitting: Obstetrics & Gynecology

## 2016-07-10 ENCOUNTER — Ambulatory Visit (INDEPENDENT_AMBULATORY_CARE_PROVIDER_SITE_OTHER): Payer: Medicare Other | Admitting: Obstetrics & Gynecology

## 2016-07-10 VITALS — BP 110/80 | HR 72 | Wt 198.0 lb

## 2016-07-10 DIAGNOSIS — N898 Other specified noninflammatory disorders of vagina: Secondary | ICD-10-CM | POA: Diagnosis not present

## 2016-07-10 DIAGNOSIS — B373 Candidiasis of vulva and vagina: Secondary | ICD-10-CM | POA: Diagnosis not present

## 2016-07-10 DIAGNOSIS — B3731 Acute candidiasis of vulva and vagina: Secondary | ICD-10-CM

## 2016-07-10 MED ORDER — TERCONAZOLE 0.4 % VA CREA: 1.0000 | TOPICAL_CREAM | Freq: Every day | VAGINAL | 0 refills | Status: DC
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
## 2016-07-11 DIAGNOSIS — R339 Retention of urine, unspecified: Secondary | ICD-10-CM | POA: Diagnosis not present

## 2016-07-11 DIAGNOSIS — Z87898 Personal history of other specified conditions: Secondary | ICD-10-CM | POA: Diagnosis not present

## 2016-07-11 DIAGNOSIS — K5900 Constipation, unspecified: Secondary | ICD-10-CM | POA: Diagnosis not present

## 2016-07-11 DIAGNOSIS — R1114 Bilious vomiting: Secondary | ICD-10-CM | POA: Diagnosis not present

## 2016-07-11 DIAGNOSIS — K219 Gastro-esophageal reflux disease without esophagitis: Secondary | ICD-10-CM | POA: Diagnosis not present

## 2016-07-11 DIAGNOSIS — K5909 Other constipation: Secondary | ICD-10-CM | POA: Diagnosis not present

## 2016-07-12 ENCOUNTER — Other Ambulatory Visit: Payer: Self-pay | Admitting: Nurse Practitioner

## 2016-07-12 DIAGNOSIS — S134XXA Sprain of ligaments of cervical spine, initial encounter: Secondary | ICD-10-CM | POA: Diagnosis not present

## 2016-07-12 DIAGNOSIS — M9902 Segmental and somatic dysfunction of thoracic region: Secondary | ICD-10-CM | POA: Diagnosis not present

## 2016-07-12 DIAGNOSIS — M546 Pain in thoracic spine: Secondary | ICD-10-CM | POA: Diagnosis not present

## 2016-07-12 DIAGNOSIS — M79673 Pain in unspecified foot: Secondary | ICD-10-CM | POA: Diagnosis not present

## 2016-07-12 DIAGNOSIS — M9901 Segmental and somatic dysfunction of cervical region: Secondary | ICD-10-CM | POA: Diagnosis not present

## 2016-07-12 DIAGNOSIS — M9903 Segmental and somatic dysfunction of lumbar region: Secondary | ICD-10-CM | POA: Diagnosis not present

## 2016-07-12 DIAGNOSIS — B070 Plantar wart: Secondary | ICD-10-CM | POA: Diagnosis not present

## 2016-07-12 DIAGNOSIS — S335XXA Sprain of ligaments of lumbar spine, initial encounter: Secondary | ICD-10-CM | POA: Diagnosis not present

## 2016-07-15 NOTE — Progress Notes (Signed)
This encounter was created in error - please disregard.
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
## 2016-07-16 DIAGNOSIS — F322 Major depressive disorder, single episode, severe without psychotic features: Secondary | ICD-10-CM | POA: Diagnosis not present

## 2016-07-16 DIAGNOSIS — F4310 Post-traumatic stress disorder, unspecified: Secondary | ICD-10-CM | POA: Diagnosis not present

## 2016-07-17 ENCOUNTER — Encounter: Payer: Self-pay | Admitting: Obstetrics & Gynecology

## 2016-07-17 ENCOUNTER — Ambulatory Visit (INDEPENDENT_AMBULATORY_CARE_PROVIDER_SITE_OTHER): Payer: Medicare Other | Admitting: Obstetrics & Gynecology

## 2016-07-17 VITALS — BP 130/90 | HR 84 | Wt 199.4 lb

## 2016-07-17 DIAGNOSIS — B3731 Acute candidiasis of vulva and vagina: Secondary | ICD-10-CM

## 2016-07-17 DIAGNOSIS — B373 Candidiasis of vulva and vagina: Secondary | ICD-10-CM

## 2016-07-17 DIAGNOSIS — B370 Candidal stomatitis: Secondary | ICD-10-CM

## 2016-07-17 MED ORDER — NYSTATIN NICU ORAL SYRINGE 100,000 UNITS/ML: 0.5000 mL | Freq: Four times a day (QID) | OROMUCOSAL | 1 refills | Status: DC

## 2016-07-17 MED ORDER — FLUCONAZOLE 100 MG PO TABS: 100.0000 mg | ORAL_TABLET | Freq: Every day | ORAL | 0 refills | Status: DC
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
## 2016-07-18 DIAGNOSIS — S335XXA Sprain of ligaments of lumbar spine, initial encounter: Secondary | ICD-10-CM | POA: Diagnosis not present

## 2016-07-18 DIAGNOSIS — S134XXA Sprain of ligaments of cervical spine, initial encounter: Secondary | ICD-10-CM | POA: Diagnosis not present

## 2016-07-18 DIAGNOSIS — M9903 Segmental and somatic dysfunction of lumbar region: Secondary | ICD-10-CM | POA: Diagnosis not present

## 2016-07-18 DIAGNOSIS — M546 Pain in thoracic spine: Secondary | ICD-10-CM | POA: Diagnosis not present

## 2016-07-18 DIAGNOSIS — M9901 Segmental and somatic dysfunction of cervical region: Secondary | ICD-10-CM | POA: Diagnosis not present

## 2016-07-18 DIAGNOSIS — M9902 Segmental and somatic dysfunction of thoracic region: Secondary | ICD-10-CM | POA: Diagnosis not present

## 2016-07-23 ENCOUNTER — Encounter (HOSPITAL_COMMUNITY)
Admission: RE | Admit: 2016-07-23 | Discharge: 2016-07-23 | Disposition: A | Discharge status: Home / Self Care | Payer: Medicare Other | Source: Ambulatory Visit | Attending: Hematology and Oncology | Admitting: Hematology and Oncology

## 2016-07-23 ENCOUNTER — Other Ambulatory Visit (HOSPITAL_COMMUNITY): Payer: Self-pay | Admitting: *Deleted

## 2016-07-23 DIAGNOSIS — Z79899 Other long term (current) drug therapy: Secondary | ICD-10-CM | POA: Diagnosis not present

## 2016-07-23 DIAGNOSIS — F4310 Post-traumatic stress disorder, unspecified: Secondary | ICD-10-CM | POA: Diagnosis not present

## 2016-07-23 DIAGNOSIS — M79673 Pain in unspecified foot: Secondary | ICD-10-CM | POA: Diagnosis not present

## 2016-07-23 DIAGNOSIS — D649 Anemia, unspecified: Secondary | ICD-10-CM | POA: Diagnosis not present

## 2016-07-23 DIAGNOSIS — B070 Plantar wart: Secondary | ICD-10-CM | POA: Diagnosis not present

## 2016-07-23 DIAGNOSIS — S91301A Unspecified open wound, right foot, initial encounter: Secondary | ICD-10-CM | POA: Diagnosis not present

## 2016-07-23 MED ORDER — SODIUM CHLORIDE 0.9% FLUSH: 10.0000 mL | Freq: Once | INTRAVENOUS | Status: DC

## 2016-07-23 MED ORDER — HEPARIN SOD (PORK) LOCK FLUSH 100 UNIT/ML IV SOLN
500.0000 [IU] | Freq: Once | INTRAVENOUS | Status: AC
  Administered 2016-07-23: 500 [IU]
  Filled 2016-07-23: qty 5

## 2016-07-23 NOTE — Progress Notes (Signed)
Port flushed with nacl and heparin with no complaints voiced.  Good blood return noted.  Site clean dry and intact with no bruising or swelling noted at site.  Patient instructed to return in 4 weeks with understanding verbalized,

## 2016-07-24 ENCOUNTER — Ambulatory Visit (INDEPENDENT_AMBULATORY_CARE_PROVIDER_SITE_OTHER): Payer: Medicare Other | Admitting: Family Medicine

## 2016-07-24 ENCOUNTER — Encounter: Payer: Self-pay | Admitting: Family Medicine

## 2016-07-24 ENCOUNTER — Ambulatory Visit (HOSPITAL_COMMUNITY)
Admission: RE | Admit: 2016-07-24 | Discharge: 2016-07-24 | Disposition: A | Discharge status: Home / Self Care | Payer: Medicare Other | Source: Ambulatory Visit | Attending: Family Medicine | Admitting: Family Medicine

## 2016-07-24 VITALS — BP 122/86 | Temp 98.4°F | Ht 64.0 in | Wt 195.1 lb

## 2016-07-24 DIAGNOSIS — N3941 Urge incontinence: Secondary | ICD-10-CM | POA: Diagnosis not present

## 2016-07-24 DIAGNOSIS — M6289 Other specified disorders of muscle: Secondary | ICD-10-CM | POA: Diagnosis not present

## 2016-07-24 DIAGNOSIS — R05 Cough: Secondary | ICD-10-CM | POA: Insufficient documentation

## 2016-07-24 DIAGNOSIS — R059 Cough, unspecified: Secondary | ICD-10-CM

## 2016-07-24 DIAGNOSIS — J4541 Moderate persistent asthma with (acute) exacerbation: Secondary | ICD-10-CM | POA: Diagnosis not present

## 2016-07-24 DIAGNOSIS — R053 Chronic cough: Secondary | ICD-10-CM

## 2016-07-24 DIAGNOSIS — K5900 Constipation, unspecified: Secondary | ICD-10-CM | POA: Diagnosis not present

## 2016-07-24 DIAGNOSIS — R079 Chest pain, unspecified: Secondary | ICD-10-CM | POA: Diagnosis not present

## 2016-07-24 DIAGNOSIS — R278 Other lack of coordination: Secondary | ICD-10-CM | POA: Diagnosis not present

## 2016-07-24 MED ORDER — BENZONATATE 100 MG PO CAPS: 100.0000 mg | ORAL_CAPSULE | Freq: Three times a day (TID) | ORAL | 2 refills | Status: DC | PRN

## 2016-07-24 NOTE — Progress Notes (Signed)
Subjective:    Patient ID: [name], female    DOB: November 10, 1980, 36 y.o.   MRN: [id number] Patient arrives office with ongoing challenges with cough Cough  This is a new problem. The current episode started in the past 7 days. The problem has been unchanged. The cough is non-productive. Nothing aggravates the symptoms. Treatments tried: antibiotic, cough syrup. The treatment provided no relief.   Productive cough ongoing gunky at times. Has been through several antibiotics. Respirated by ongoing cough. Next  Continues to have substantial issues of reflux.  Wheezing at times. Though inhaled steroid seems to help some. Cough worse at night. At times productive.  Claims compliance with all of her chronic medications.  In the middle of switching to the [facility] for her complicated tertiary concerns. Patient notes in passing that soon she will be having her 40th endoscopy of her life, yes, 40th  Pt has reflux, switcheed to [facility], saw th surgeon who had done prior procedure  Due to see multiple specialists      Review of Systems  Respiratory: Positive for cough.    No headache positive chest pain anterior sharp worse with deep breath    Objective:   Physical Exam  Alert talkative vitals stable H&T mom his congestion pharynx normal lungs no wheezes currently no crackles no tachypnea heart regular rhythm some chest wall pain  Chest x-ray unremarkable as far as acute or pathological processes      Assessment & Plan:  Impression chronic cough long discussion held. Likely multifactorial including #2 #3 and #4 #2 chronic severe profound debilitating and disabling reflux #3 clinical asthma #4 recent viral syndrome with likely secondary exacerbation of reactive airways plan chest x-ray. Stronger cough medicine at night per patient request. Jerilynn Som during day. No more antibiotics rationale discussed follow-up with many specialist

## 2016-07-25 ENCOUNTER — Other Ambulatory Visit: Payer: Self-pay | Admitting: Family Medicine

## 2016-07-25 ENCOUNTER — Other Ambulatory Visit: Payer: Self-pay | Admitting: *Deleted

## 2016-07-25 ENCOUNTER — Telehealth: Payer: Self-pay | Admitting: *Deleted

## 2016-07-25 ENCOUNTER — Encounter: Payer: Self-pay | Admitting: Family Medicine

## 2016-07-25 DIAGNOSIS — M9903 Segmental and somatic dysfunction of lumbar region: Secondary | ICD-10-CM | POA: Diagnosis not present

## 2016-07-25 DIAGNOSIS — M546 Pain in thoracic spine: Secondary | ICD-10-CM | POA: Diagnosis not present

## 2016-07-25 DIAGNOSIS — S134XXA Sprain of ligaments of cervical spine, initial encounter: Secondary | ICD-10-CM | POA: Diagnosis not present

## 2016-07-25 DIAGNOSIS — S335XXA Sprain of ligaments of lumbar spine, initial encounter: Secondary | ICD-10-CM | POA: Diagnosis not present

## 2016-07-25 DIAGNOSIS — M9902 Segmental and somatic dysfunction of thoracic region: Secondary | ICD-10-CM | POA: Diagnosis not present

## 2016-07-25 DIAGNOSIS — M9901 Segmental and somatic dysfunction of cervical region: Secondary | ICD-10-CM | POA: Diagnosis not present

## 2016-07-25 MED ORDER — HYDROCODONE-HOMATROPINE 5-1.5 MG/5ML PO SYRP: ORAL_SOLUTION | ORAL | 0 refills | Status: DC

## 2016-07-25 NOTE — Telephone Encounter (Signed)
Ok times one 3 oz one tspn qhs prn

## 2016-07-25 NOTE — Telephone Encounter (Signed)
Pt notified script ready for pickup through my chart.

## 2016-07-25 NOTE — Telephone Encounter (Signed)
Jaylei L. Lorin Mercy would like a refill of the following medications:     HYDROcodone-homatropine (HYCODAN) 5-1.5 MG/5ML syrup Mickie Hillier, MD]    [facility] pharmacy: [id number] [place], [place] yesterday for cough.

## 2016-07-27 ENCOUNTER — Encounter: Payer: Self-pay | Admitting: Obstetrics & Gynecology

## 2016-07-27 ENCOUNTER — Ambulatory Visit (INDEPENDENT_AMBULATORY_CARE_PROVIDER_SITE_OTHER): Payer: Medicare Other | Admitting: Obstetrics & Gynecology

## 2016-07-27 VITALS — BP 110/70 | HR 74 | Wt 196.0 lb

## 2016-07-27 DIAGNOSIS — N9489 Other specified conditions associated with female genital organs and menstrual cycle: Secondary | ICD-10-CM | POA: Diagnosis not present

## 2016-07-27 DIAGNOSIS — R102 Pelvic and perineal pain: Secondary | ICD-10-CM

## 2016-07-27 MED ORDER — CLOBETASOL PROPIONATE 0.05 % EX CREA: 1.0000 "application " | TOPICAL_CREAM | Freq: Two times a day (BID) | CUTANEOUS | 0 refills | Status: DC
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
## 2016-07-30 ENCOUNTER — Telehealth: Payer: Self-pay | Admitting: *Deleted

## 2016-07-30 NOTE — Telephone Encounter (Signed)
Fax from [facility] [employer] requesting new rx for qvar 31mcg reidhaler. qvar hfa inalers are in the process of being discontinued by the manufacturer. Did not see med on pt's med list but office note in January states to initiate qvar.

## 2016-08-02 NOTE — Telephone Encounter (Signed)
Patient states that this med needs a prior authorization and she wants that process completed. Explained to patient that the pharmacy has to send [place] a PA request first before we can do one. She stated that she was going to talk to her pharmacy about this first and let us know.

## 2016-08-02 NOTE — Telephone Encounter (Signed)
?  actually I see flovent on the med list, I may have dict wrong thing or pt may be on old med, either way , may maintian flovent or go with qvar you may have to slk to pt to clarify

## 2016-08-06 DIAGNOSIS — K210 Gastro-esophageal reflux disease with esophagitis: Secondary | ICD-10-CM | POA: Diagnosis not present

## 2016-08-06 DIAGNOSIS — F419 Anxiety disorder, unspecified: Secondary | ICD-10-CM | POA: Diagnosis not present

## 2016-08-06 DIAGNOSIS — G4730 Sleep apnea, unspecified: Secondary | ICD-10-CM | POA: Diagnosis not present

## 2016-08-06 DIAGNOSIS — R12 Heartburn: Secondary | ICD-10-CM | POA: Diagnosis not present

## 2016-08-06 DIAGNOSIS — Z86718 Personal history of other venous thrombosis and embolism: Secondary | ICD-10-CM | POA: Diagnosis not present

## 2016-08-06 DIAGNOSIS — Z6833 Body mass index (BMI) 33.0-33.9, adult: Secondary | ICD-10-CM | POA: Diagnosis not present

## 2016-08-07 ENCOUNTER — Other Ambulatory Visit: Payer: Self-pay | Admitting: *Deleted

## 2016-08-07 ENCOUNTER — Telehealth: Payer: Self-pay | Admitting: Family Medicine

## 2016-08-07 MED ORDER — PROMETHAZINE HCL 25 MG PO TABS: 25.0000 mg | ORAL_TABLET | Freq: Four times a day (QID) | ORAL | 0 refills | Status: DC | PRN

## 2016-08-07 NOTE — Telephone Encounter (Signed)
Phen 25 numb 30 one q six hrs prn nausea

## 2016-08-07 NOTE — Telephone Encounter (Signed)
Patient had an endoscopy done yesterday and they had to put her under.  She was put on Zofran but its not helping her nausea.  She is having trouble getting in touch with her surgeon and wants to know if we can send in Rx for Phenergan or nausea patch   [facility]

## 2016-08-07 NOTE — Telephone Encounter (Signed)
Med sent to pharm. Pt notified.  

## 2016-08-09 ENCOUNTER — Encounter: Payer: Self-pay | Admitting: Family Medicine

## 2016-08-09 ENCOUNTER — Ambulatory Visit (INDEPENDENT_AMBULATORY_CARE_PROVIDER_SITE_OTHER): Payer: Medicare Other | Admitting: Family Medicine

## 2016-08-09 VITALS — BP 122/82 | Temp 98.0°F | Ht 64.0 in | Wt 200.2 lb

## 2016-08-09 DIAGNOSIS — R059 Cough, unspecified: Secondary | ICD-10-CM

## 2016-08-09 DIAGNOSIS — H60393 Other infective otitis externa, bilateral: Secondary | ICD-10-CM | POA: Diagnosis not present

## 2016-08-09 DIAGNOSIS — R05 Cough: Secondary | ICD-10-CM | POA: Diagnosis not present

## 2016-08-09 MED ORDER — HYDROCODONE-HOMATROPINE 5-1.5 MG/5ML PO SYRP: ORAL_SOLUTION | ORAL | 0 refills | Status: DC

## 2016-08-09 MED ORDER — NEOMYCIN-POLYMYXIN-HC 3.5-10000-1 OT SOLN: 3.0000 [drp] | Freq: Four times a day (QID) | OTIC | 1 refills | Status: DC

## 2016-08-09 NOTE — Progress Notes (Signed)
Subjective:    Patient ID: [name], female    DOB: 1981/05/16, 36 y.o.   MRN: [id number]  Otalgia   There is pain in both ears. This is a new problem. The current episode started in the past 7 days. Treatments tried: otc ear drops.    Ear discomfort  Sharp throbbing and constant  otc drops not really helping   Patient also reports considerable nighttime cough.  Review of Systems  HENT: Positive for ear pain.        Objective:   Physical Exam  Alert vital stable slight nasal congestion external ear inflammation left greater than right pharynx normal neck supple lungs clear. Heart regular in rhythm.      Assessment & Plan:  Impression 1 external otitis appropriate management discussed #2 nocturnal cough improving patient request 1 more round of her stronger cough medicine which is reasonable symptom care discussed, patient noted she also is facing potential GI surgery WSL

## 2016-08-14 DIAGNOSIS — N3010 Interstitial cystitis (chronic) without hematuria: Secondary | ICD-10-CM | POA: Diagnosis not present

## 2016-08-16 ENCOUNTER — Encounter: Payer: Self-pay | Admitting: Family Medicine

## 2016-08-16 ENCOUNTER — Ambulatory Visit: Payer: Medicare Other | Admitting: Obstetrics & Gynecology

## 2016-08-17 ENCOUNTER — Other Ambulatory Visit: Payer: Self-pay | Admitting: Nurse Practitioner

## 2016-08-20 ENCOUNTER — Other Ambulatory Visit: Payer: Self-pay

## 2016-08-20 ENCOUNTER — Encounter (HOSPITAL_COMMUNITY): Payer: Self-pay

## 2016-08-21 ENCOUNTER — Ambulatory Visit (INDEPENDENT_AMBULATORY_CARE_PROVIDER_SITE_OTHER): Payer: Medicare Other | Admitting: Obstetrics & Gynecology

## 2016-08-21 ENCOUNTER — Other Ambulatory Visit (HOSPITAL_COMMUNITY)
Admission: RE | Admit: 2016-08-21 | Discharge: 2016-08-21 | Disposition: A | Discharge status: Home / Self Care | Payer: Medicare Other | Source: Ambulatory Visit | Attending: Obstetrics & Gynecology | Admitting: Obstetrics & Gynecology

## 2016-08-21 ENCOUNTER — Encounter: Payer: Self-pay | Admitting: Obstetrics & Gynecology

## 2016-08-21 VITALS — BP 116/78 | HR 70 | Wt 200.0 lb

## 2016-08-21 DIAGNOSIS — R87615 Unsatisfactory cytologic smear of cervix: Secondary | ICD-10-CM

## 2016-08-21 DIAGNOSIS — N870 Mild cervical dysplasia: Secondary | ICD-10-CM
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
## 2016-08-24 ENCOUNTER — Other Ambulatory Visit: Payer: Self-pay | Admitting: Family Medicine

## 2016-08-24 LAB — CYTOLOGY - PAP
Chlamydia: NEGATIVE
DIAGNOSIS: NEGATIVE
HPV: NOT DETECTED
Neisseria Gonorrhea: NEGATIVE

## 2016-08-24 NOTE — Telephone Encounter (Signed)
Ok plus five monthly ref 

## 2016-08-26 ENCOUNTER — Other Ambulatory Visit: Payer: Self-pay | Admitting: Family Medicine

## 2016-08-27 NOTE — Telephone Encounter (Signed)
Six mo worth ok 

## 2016-08-29 DIAGNOSIS — Z9884 Bariatric surgery status: Secondary | ICD-10-CM | POA: Diagnosis not present

## 2016-08-29 DIAGNOSIS — R110 Nausea: Secondary | ICD-10-CM | POA: Diagnosis not present

## 2016-08-29 DIAGNOSIS — K210 Gastro-esophageal reflux disease with esophagitis: Secondary | ICD-10-CM | POA: Diagnosis not present

## 2016-08-29 DIAGNOSIS — D689 Coagulation defect, unspecified: Secondary | ICD-10-CM | POA: Diagnosis not present

## 2016-08-29 DIAGNOSIS — J411 Mucopurulent chronic bronchitis: Secondary | ICD-10-CM | POA: Diagnosis not present

## 2016-08-30 DIAGNOSIS — S91301A Unspecified open wound, right foot, initial encounter: Secondary | ICD-10-CM | POA: Diagnosis not present

## 2016-08-30 DIAGNOSIS — M79673 Pain in unspecified foot: Secondary | ICD-10-CM | POA: Diagnosis not present

## 2016-09-03 ENCOUNTER — Telehealth: Payer: Self-pay | Admitting: Nurse Practitioner

## 2016-09-03 MED ORDER — PRAVASTATIN SODIUM 10 MG PO TABS: ORAL_TABLET | ORAL | 0 refills | Status: DC

## 2016-09-03 MED ORDER — PRAVASTATIN SODIUM 10 MG PO TABS: 10.0000 mg | ORAL_TABLET | Freq: Every day | ORAL | 0 refills | Status: DC

## 2016-09-03 NOTE — Telephone Encounter (Signed)
Patient has changed pharmacies.  She is requesting partial fill for her Pravastatin until 09/19/16 to be sent to [place].  She says they will overnight this to her.  She said she also needs renewal sent for her Pravastatin as well for her monthly Rx. (p) [phone] (f) [phone]

## 2016-09-03 NOTE — Telephone Encounter (Signed)
Sent both partial and full script to [facility]. Notified patient and patient verbalized understanding.

## 2016-09-04 ENCOUNTER — Other Ambulatory Visit: Payer: Self-pay | Admitting: *Deleted

## 2016-09-04 DIAGNOSIS — M546 Pain in thoracic spine: Secondary | ICD-10-CM | POA: Diagnosis not present

## 2016-09-04 DIAGNOSIS — M9903 Segmental and somatic dysfunction of lumbar region: Secondary | ICD-10-CM | POA: Diagnosis not present

## 2016-09-04 DIAGNOSIS — S335XXA Sprain of ligaments of lumbar spine, initial encounter: Secondary | ICD-10-CM | POA: Diagnosis not present

## 2016-09-04 DIAGNOSIS — M9902 Segmental and somatic dysfunction of thoracic region: Secondary | ICD-10-CM | POA: Diagnosis not present

## 2016-09-04 DIAGNOSIS — S134XXA Sprain of ligaments of cervical spine, initial encounter: Secondary | ICD-10-CM | POA: Diagnosis not present

## 2016-09-04 DIAGNOSIS — M9901 Segmental and somatic dysfunction of cervical region: Secondary | ICD-10-CM | POA: Diagnosis not present

## 2016-09-04 MED ORDER — PRAVASTATIN SODIUM 10 MG PO TABS: ORAL_TABLET | ORAL | 0 refills | Status: DC

## 2016-09-05 DIAGNOSIS — N9489 Other specified conditions associated with female genital organs and menstrual cycle: Secondary | ICD-10-CM | POA: Diagnosis not present

## 2016-09-05 DIAGNOSIS — Z9884 Bariatric surgery status: Secondary | ICD-10-CM | POA: Diagnosis not present

## 2016-09-05 DIAGNOSIS — Z903 Acquired absence of stomach [part of]: Secondary | ICD-10-CM | POA: Diagnosis not present

## 2016-09-05 DIAGNOSIS — R8299 Other abnormal findings in urine: Secondary | ICD-10-CM | POA: Diagnosis not present

## 2016-09-05 DIAGNOSIS — Z87448 Personal history of other diseases of urinary system: Secondary | ICD-10-CM | POA: Diagnosis not present

## 2016-09-17 ENCOUNTER — Encounter (HOSPITAL_COMMUNITY)
Admission: RE | Admit: 2016-09-17 | Discharge: 2016-09-17 | Disposition: A | Discharge status: Home / Self Care | Payer: Medicare Other | Source: Ambulatory Visit | Attending: Hematology and Oncology | Admitting: Hematology and Oncology

## 2016-09-17 DIAGNOSIS — Z95828 Presence of other vascular implants and grafts: Secondary | ICD-10-CM | POA: Insufficient documentation

## 2016-09-17 MED ORDER — HEPARIN SOD (PORK) LOCK FLUSH 100 UNIT/ML IV SOLN
INTRAVENOUS | Status: AC
  Filled 2016-09-17: qty 5

## 2016-09-17 MED ORDER — HEPARIN SOD (PORK) LOCK FLUSH 100 UNIT/ML IV SOLN
500.0000 [IU] | Freq: Once | INTRAVENOUS | Status: AC
  Administered 2016-09-17: 500 [IU]

## 2016-09-17 MED ORDER — SODIUM CHLORIDE 0.9% FLUSH: 10.0000 mL | Freq: Once | INTRAVENOUS | Status: DC

## 2016-09-19 ENCOUNTER — Other Ambulatory Visit: Payer: Self-pay | Admitting: *Deleted

## 2016-09-20 DIAGNOSIS — S134XXA Sprain of ligaments of cervical spine, initial encounter: Secondary | ICD-10-CM | POA: Diagnosis not present

## 2016-09-20 DIAGNOSIS — M9901 Segmental and somatic dysfunction of cervical region: Secondary | ICD-10-CM | POA: Diagnosis not present

## 2016-09-20 DIAGNOSIS — S335XXA Sprain of ligaments of lumbar spine, initial encounter: Secondary | ICD-10-CM | POA: Diagnosis not present

## 2016-09-20 DIAGNOSIS — M9903 Segmental and somatic dysfunction of lumbar region: Secondary | ICD-10-CM | POA: Diagnosis not present

## 2016-09-20 DIAGNOSIS — M9902 Segmental and somatic dysfunction of thoracic region: Secondary | ICD-10-CM | POA: Diagnosis not present

## 2016-09-20 DIAGNOSIS — M546 Pain in thoracic spine: Secondary | ICD-10-CM | POA: Diagnosis not present

## 2016-09-20 MED ORDER — ALBUTEROL SULFATE HFA 108 (90 BASE) MCG/ACT IN AERS: 2.0000 | INHALATION_SPRAY | Freq: Four times a day (QID) | RESPIRATORY_TRACT | 2 refills | Status: DC | PRN

## 2016-09-20 MED ORDER — FLUTICASONE PROPIONATE HFA 110 MCG/ACT IN AERO
2.0000 | INHALATION_SPRAY | Freq: Two times a day (BID) | RESPIRATORY_TRACT | 12 refills | Status: DC
Start: 2016-09-20 — End: 2016-12-13

## 2016-09-25 ENCOUNTER — Ambulatory Visit (INDEPENDENT_AMBULATORY_CARE_PROVIDER_SITE_OTHER): Payer: Medicare Other | Admitting: Family Medicine

## 2016-09-25 ENCOUNTER — Other Ambulatory Visit (HOSPITAL_COMMUNITY)
Admission: RE | Admit: 2016-09-25 | Discharge: 2016-09-25 | Disposition: A | Discharge status: Home / Self Care | Payer: Medicare Other | Source: Ambulatory Visit | Attending: Family Medicine | Admitting: Family Medicine

## 2016-09-25 ENCOUNTER — Other Ambulatory Visit: Payer: Self-pay | Admitting: *Deleted

## 2016-09-25 ENCOUNTER — Encounter: Payer: Self-pay | Admitting: Family Medicine

## 2016-09-25 VITALS — BP 130/88 | HR 82 | Temp 98.6°F | Ht 64.0 in | Wt 201.5 lb

## 2016-09-25 DIAGNOSIS — B9689 Other specified bacterial agents as the cause of diseases classified elsewhere: Secondary | ICD-10-CM

## 2016-09-25 DIAGNOSIS — L258 Unspecified contact dermatitis due to other agents: Secondary | ICD-10-CM | POA: Insufficient documentation

## 2016-09-25 DIAGNOSIS — A084 Viral intestinal infection, unspecified: Secondary | ICD-10-CM

## 2016-09-25 DIAGNOSIS — J019 Acute sinusitis, unspecified: Secondary | ICD-10-CM | POA: Insufficient documentation

## 2016-09-25 LAB — BASIC METABOLIC PANEL
ANION GAP: 9 (ref 5–15)
CHLORIDE: 104 mmol/L (ref 101–111)
CO2: 25 mmol/L (ref 22–32)
Calcium: 9.6 mg/dL (ref 8.9–10.3)
Creatinine, Ser: 0.77 mg/dL (ref 0.44–1.00)
GFR calc Af Amer: >60 mL/min (ref >60)
Glucose, Bld: 96 mg/dL (ref 65–99)
POTASSIUM: 3.2 mmol/L — AB (ref 3.5–5.1)
Sodium: 138 mmol/L (ref 135–145)

## 2016-09-25 LAB — CBC WITH DIFFERENTIAL/PLATELET
Basophils Absolute: 0 10*3/uL (ref 0.0–0.1)
Basophils Relative: 0 %
EOS PCT: 1 %
Eosinophils Absolute: 0.2 10*3/uL (ref 0.0–0.7)
HCT: 46.1 % — ABNORMAL HIGH (ref 36.0–46.0)
Hemoglobin: 15 g/dL (ref 12.0–15.0)
Lymphocytes Relative: 18 %
Lymphs Abs: 2.1 10*3/uL (ref 0.7–4.0)
MCH: 30.5 pg (ref 26.0–34.0)
MCHC: 32.5 g/dL (ref 30.0–36.0)
MCV: 93.9 fL (ref 78.0–100.0)
Monocytes Absolute: 0.5 10*3/uL (ref 0.1–1.0)
Monocytes Relative: 5 %
NEUTROS ABS: 8.6 10*3/uL — AB (ref 1.7–7.7)
Neutrophils Relative %: 76 %
PLATELETS: 265 10*3/uL (ref 150–400)
RBC: 4.91 MIL/uL (ref 3.87–5.11)
RDW: 13.4 % (ref 11.5–15.5)
WBC: 11.4 10*3/uL — ABNORMAL HIGH (ref 4.0–10.5)

## 2016-09-25 LAB — LIPASE, BLOOD: Lipase: 19 U/L (ref 11–51)

## 2016-09-25 LAB — HEPATIC FUNCTION PANEL
ALBUMIN: 4.6 g/dL (ref 3.5–5.0)
ALT: 21 U/L (ref 14–54)
AST: 25 U/L (ref 15–41)
Alkaline Phosphatase: 73 U/L (ref 38–126)
Bilirubin, Direct: 0.1 mg/dL (ref 0.1–0.5)
Indirect Bilirubin: 0.2 mg/dL — ABNORMAL LOW (ref 0.3–0.9)
TOTAL PROTEIN: 8.2 g/dL — AB (ref 6.5–8.1)
Total Bilirubin: 0.3 mg/dL (ref 0.3–1.2)

## 2016-09-25 LAB — LITHIUM LEVEL: LITHIUM LVL: 0.63 mmol/L (ref 0.60–1.20)

## 2016-09-25 MED ORDER — CEFDINIR 300 MG PO CAPS: 300.0000 mg | ORAL_CAPSULE | Freq: Two times a day (BID) | ORAL | 0 refills | Status: DC

## 2016-09-25 MED ORDER — HYDROCODONE-HOMATROPINE 5-1.5 MG/5ML PO SYRP: 5.0000 mL | ORAL_SOLUTION | Freq: Three times a day (TID) | ORAL | 0 refills | Status: DC | PRN

## 2016-09-25 MED ORDER — TRIAMCINOLONE ACETONIDE 0.1 % EX CREA: 1.0000 "application " | TOPICAL_CREAM | Freq: Two times a day (BID) | CUTANEOUS | 4 refills | Status: DC

## 2016-09-25 MED ORDER — PROMETHAZINE HCL 25 MG PO TABS: 25.0000 mg | ORAL_TABLET | Freq: Three times a day (TID) | ORAL | 1 refills | Status: DC | PRN

## 2016-09-25 MED ORDER — POTASSIUM CHLORIDE ER 10 MEQ PO TBCR: 10.0000 meq | EXTENDED_RELEASE_TABLET | Freq: Two times a day (BID) | ORAL | 0 refills | Status: DC

## 2016-09-25 MED ORDER — CEFTRIAXONE SODIUM 500 MG IJ SOLR
500.0000 mg | Freq: Once | INTRAMUSCULAR | Status: AC
  Administered 2016-09-25: 500 mg via INTRAMUSCULAR

## 2016-09-25 NOTE — Progress Notes (Signed)
Subjective:    Patient ID: [name], female    DOB: 1981/05/23, 36 y.o.   MRN: [id number]  URI   This is a new problem. The current episode started 1 to 4 weeks ago. The problem has been unchanged. There has been no fever. Associated symptoms include abdominal pain, coughing, ear pain, headaches, a sore throat and vomiting. She has tried decongestant for the symptoms. The treatment provided no relief.   This patient has a long complex history of reflux related issues gastric bypass related issues is now currently having vomiting diarrhea at times projectile vomiting denies any severe abdominal pain denies high fever chills relates a lot of head congestion drainage and bringing out mucus denies wheezing or difficulty breathing. States she is able to rest some but she's been having difficult time with this illness over the past couple weeks progressively worse and she is seeing a specialist for the possibility of additional gastric surgery   Review of Systems  HENT: Positive for ear pain and sore throat.   Respiratory: Positive for cough.   Gastrointestinal: Positive for abdominal pain and vomiting.  Neurological: Positive for headaches.       Objective:   Physical Exam She appears pleasant Meeks membranes moist neck no masses lungs are clear no crackles heart is regular abdomen is soft subjected discomfort no guarding rebound extremities warm dry       Assessment & Plan:  Viral illness Possible viral gastroenteritis I doubt intestinal obstruction but if she gets worse she will need to have x-rays or possible scan or ER visit Stat lab work ordered to make sure that parameters look good because of abdominal discomfort and respiratory illness Probable sinus infection antibiotics prescribed warning signs discussed follow-up if ongoing troubles  Stat lab work shows a slightly low potassium we will supplement the potassium for the next week in addition to this Phenergan as needed  for the nausea plus also the patient is instructed to follow-up with [place] or the ER if progressively worse white blood count slightly elevated consistent with a gastroenteritis

## 2016-09-26 ENCOUNTER — Telehealth: Payer: Self-pay | Admitting: Nurse Practitioner

## 2016-09-26 ENCOUNTER — Other Ambulatory Visit: Payer: Self-pay | Admitting: Nurse Practitioner

## 2016-09-26 ENCOUNTER — Other Ambulatory Visit: Payer: Self-pay | Admitting: *Deleted

## 2016-09-26 ENCOUNTER — Encounter: Payer: Self-pay | Admitting: Nurse Practitioner

## 2016-09-26 DIAGNOSIS — L299 Pruritus, unspecified: Secondary | ICD-10-CM | POA: Diagnosis not present

## 2016-09-26 DIAGNOSIS — K209 Esophagitis, unspecified without bleeding: Secondary | ICD-10-CM

## 2016-09-26 DIAGNOSIS — R21 Rash and other nonspecific skin eruption: Secondary | ICD-10-CM | POA: Diagnosis not present

## 2016-09-26 DIAGNOSIS — F4310 Post-traumatic stress disorder, unspecified: Secondary | ICD-10-CM | POA: Diagnosis not present

## 2016-09-26 DIAGNOSIS — F322 Major depressive disorder, single episode, severe without psychotic features: Secondary | ICD-10-CM | POA: Diagnosis not present

## 2016-09-26 NOTE — Telephone Encounter (Signed)
Message sent through my chart

## 2016-09-26 NOTE — Telephone Encounter (Signed)
Patient dropped off blood work results for you to review on your desk.

## 2016-09-28 ENCOUNTER — Other Ambulatory Visit: Payer: Self-pay | Admitting: Family Medicine

## 2016-09-29 LAB — ALPHA-GAL PANEL
Alpha Gal IgE*: 0.4 kU/L — ABNORMAL HIGH (ref <0.35)
BEEF (BOS SPP) IGE: 0.13 kU/L (ref <0.35)
LAMB CLASS INTERPRETATION: 0
Pork (Sus spp) IgE: 0.18 kU/L (ref <0.35)

## 2016-09-30 ENCOUNTER — Other Ambulatory Visit: Payer: Self-pay | Admitting: Obstetrics & Gynecology

## 2016-10-02 ENCOUNTER — Telehealth: Payer: Self-pay | Admitting: *Deleted

## 2016-10-02 ENCOUNTER — Telehealth: Payer: Self-pay | Admitting: Family Medicine

## 2016-10-02 ENCOUNTER — Other Ambulatory Visit: Payer: Self-pay | Admitting: *Deleted

## 2016-10-02 ENCOUNTER — Other Ambulatory Visit: Payer: Self-pay | Admitting: Obstetrics & Gynecology

## 2016-10-02 ENCOUNTER — Other Ambulatory Visit: Payer: Self-pay | Admitting: Nurse Practitioner

## 2016-10-02 MED ORDER — FLUCONAZOLE 100 MG PO TABS: 100.0000 mg | ORAL_TABLET | Freq: Every day | ORAL | 1 refills | Status: DC

## 2016-10-02 MED ORDER — AMOXICILLIN-POT CLAVULANATE 875-125 MG PO TABS: 1.0000 | ORAL_TABLET | Freq: Two times a day (BID) | ORAL | 0 refills | Status: DC

## 2016-10-02 MED ORDER — AMOXICILLIN-POT CLAVULANATE 875-125 MG PO TABS
1.0000 | ORAL_TABLET | Freq: Two times a day (BID) | ORAL | 0 refills | Status: AC
Start: 2016-10-02 — End: 2016-10-12

## 2016-10-02 MED ORDER — BENZONATATE 100 MG PO CAPS: 100.0000 mg | ORAL_CAPSULE | Freq: Three times a day (TID) | ORAL | 2 refills | Status: DC | PRN

## 2016-10-02 MED ORDER — PREDNISONE 20 MG PO TABS: ORAL_TABLET | ORAL | 0 refills | Status: DC

## 2016-10-02 MED ORDER — POTASSIUM CHLORIDE ER 10 MEQ PO TBCR: EXTENDED_RELEASE_TABLET | ORAL | 2 refills | Status: DC

## 2016-10-02 MED ORDER — BENZONATATE 100 MG PO CAPS
100.0000 mg | ORAL_CAPSULE | Freq: Three times a day (TID) | ORAL | 2 refills | Status: DC | PRN
Start: 2016-10-02 — End: 2016-10-02

## 2016-10-02 MED ORDER — TRIAMCINOLONE ACETONIDE 0.1 % EX CREA: TOPICAL_CREAM | CUTANEOUS | 0 refills | Status: DC

## 2016-10-02 NOTE — Telephone Encounter (Signed)
Patient called with complaints of a continued yeast infection despite 2 days of diflucan. She still has 2 days to take her Omnicef for her sinus infection. She states she is also using the clobetasol cream as well and doesn't know what else to do to treat the yeast. Please advise.

## 2016-10-02 NOTE — Telephone Encounter (Signed)
See routing message

## 2016-10-02 NOTE — Telephone Encounter (Signed)
Let pt know some of diminished energy may well be left over from virsal component which can take weeks to get back to baseline, will honor pts request and call in augmentin [phone] one bid ten d

## 2016-10-02 NOTE — Telephone Encounter (Signed)
Patient has an allergy to  cephalexin would you like to still order Augmentin

## 2016-10-02 NOTE — Telephone Encounter (Signed)
yes

## 2016-10-02 NOTE — Telephone Encounter (Signed)
Message for Dr. Laurance Flatten was seen on 4/24 with URI and given a shot ,antibiotics but not feeling any better and wanting something else [facility]

## 2016-10-02 NOTE — Progress Notes (Signed)
Phone call from patient. [facility] Was sent to [facility] pharmacy. Reordered tessalon perles and Augmentin [facility] [place]. Also patients rash from previous visit has spread. Some relief with Triamcinolone. Will send in steroid taper. Office visit if no improvement.

## 2016-10-03 ENCOUNTER — Telehealth: Payer: Self-pay | Admitting: *Deleted

## 2016-10-03 DIAGNOSIS — I82623 Acute embolism and thrombosis of deep veins of upper extremity, bilateral: Secondary | ICD-10-CM | POA: Diagnosis not present

## 2016-10-03 DIAGNOSIS — D508 Other iron deficiency anemias: Secondary | ICD-10-CM | POA: Diagnosis not present

## 2016-10-03 DIAGNOSIS — Z7901 Long term (current) use of anticoagulants: Secondary | ICD-10-CM | POA: Diagnosis not present

## 2016-10-03 DIAGNOSIS — Z888 Allergy status to other drugs, medicaments and biological substances status: Secondary | ICD-10-CM | POA: Diagnosis not present

## 2016-10-03 DIAGNOSIS — E785 Hyperlipidemia, unspecified: Secondary | ICD-10-CM | POA: Diagnosis not present

## 2016-10-03 DIAGNOSIS — Z881 Allergy status to other antibiotic agents status: Secondary | ICD-10-CM | POA: Diagnosis not present

## 2016-10-03 DIAGNOSIS — K5900 Constipation, unspecified: Secondary | ICD-10-CM | POA: Diagnosis not present

## 2016-10-03 DIAGNOSIS — I10 Essential (primary) hypertension: Secondary | ICD-10-CM | POA: Diagnosis not present

## 2016-10-03 DIAGNOSIS — E538 Deficiency of other specified B group vitamins: Secondary | ICD-10-CM | POA: Diagnosis not present

## 2016-10-03 DIAGNOSIS — Z79899 Other long term (current) drug therapy: Secondary | ICD-10-CM | POA: Diagnosis not present

## 2016-10-03 DIAGNOSIS — E119 Type 2 diabetes mellitus without complications: Secondary | ICD-10-CM | POA: Diagnosis not present

## 2016-10-03 DIAGNOSIS — Z885 Allergy status to narcotic agent status: Secondary | ICD-10-CM | POA: Diagnosis not present

## 2016-10-03 DIAGNOSIS — Z86718 Personal history of other venous thrombosis and embolism: Secondary | ICD-10-CM | POA: Diagnosis not present

## 2016-10-03 NOTE — Telephone Encounter (Signed)
LMOVM that I spoke with pharmacist and Dr Elonda Husky and per there recommendation, she should not take the Triazolam and the Fluconazole at the same time since the Fluconazole can increase the concentration of Triazolam. Advised to not take Triazolam until Fluconazole is completed.  Advised to call back if she had further questions.

## 2016-10-04 ENCOUNTER — Telehealth: Payer: Self-pay | Admitting: Family Medicine

## 2016-10-04 MED ORDER — MUPIROCIN 2 % EX OINT: TOPICAL_OINTMENT | CUTANEOUS | 0 refills | Status: DC
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
## 2016-10-04 NOTE — Telephone Encounter (Signed)
Spoke with patient and informed her per Dr.Steve Luking- We sent in bactroban ointment. Apply twice a day to affected area. Patient verbalized understanding.

## 2016-10-04 NOTE — Telephone Encounter (Signed)
bactroban oit bid experts discourage silvadene these days

## 2016-10-05 ENCOUNTER — Other Ambulatory Visit: Payer: Self-pay | Admitting: Obstetrics & Gynecology

## 2016-10-08 ENCOUNTER — Telehealth: Payer: Self-pay | Admitting: Obstetrics & Gynecology

## 2016-10-08 ENCOUNTER — Encounter: Payer: Self-pay | Admitting: *Deleted

## 2016-10-08 NOTE — Telephone Encounter (Signed)
Letter typed and faxed to [facility].

## 2016-10-08 NOTE — Telephone Encounter (Signed)
Patient called stating she got a DUI on 4/26. She is requesting a letter stating that she was prescribed Diflucan and did not know there was a contraindication to taking Diflucan and Triazolam together until I called her on 5/2. Will discuss with Dr Elonda Husky .  Lawyer is [employer] [phone]

## 2016-10-15 ENCOUNTER — Other Ambulatory Visit: Payer: Self-pay | Admitting: Nurse Practitioner

## 2016-10-15 ENCOUNTER — Encounter (HOSPITAL_COMMUNITY): Payer: Self-pay

## 2016-10-15 DIAGNOSIS — M79675 Pain in left toe(s): Secondary | ICD-10-CM | POA: Diagnosis not present

## 2016-10-15 DIAGNOSIS — R21 Rash and other nonspecific skin eruption: Secondary | ICD-10-CM

## 2016-10-15 DIAGNOSIS — L600 Ingrowing nail: Secondary | ICD-10-CM | POA: Diagnosis not present

## 2016-10-16 ENCOUNTER — Telehealth: Payer: Self-pay | Admitting: Family Medicine

## 2016-10-16 ENCOUNTER — Telehealth: Payer: Self-pay | Admitting: *Deleted

## 2016-10-16 NOTE — Telephone Encounter (Signed)
Pt wants this message confidential. I assured pt that we have to keep info confidential and that I would only share with dr scott. Pt states she got a DUI because she was unable to walk a straight line. She blew a 0. Did not have alcholol in system. She wants a letter for her lawyer stating she was seen on 4/24 and treated for URI and had fluid on her ears that could have effected her equillibrian. Call [phone] when letter is ready for pickup.

## 2016-10-16 NOTE — Telephone Encounter (Signed)
Patient is requesting a nurse to call her back regarding a confidential matter.

## 2016-10-16 NOTE — Telephone Encounter (Signed)
Left message return call 10/16/2016

## 2016-10-17 ENCOUNTER — Telehealth: Payer: Self-pay | Admitting: *Deleted

## 2016-10-17 ENCOUNTER — Other Ambulatory Visit: Payer: Self-pay | Admitting: *Deleted

## 2016-10-17 ENCOUNTER — Telehealth: Payer: Self-pay | Admitting: Obstetrics & Gynecology

## 2016-10-17 NOTE — Telephone Encounter (Signed)
Fax from [facility]. ventoin HFA not preferred. Preferred is proair HFA respiclick.  Also qvar 25mcg is not preferred. Preferred is arnuity or flovent. Please advise. Letter in yellow folder in your office.

## 2016-10-17 NOTE — Telephone Encounter (Signed)
Had IUD mirena for 3 years, no period in 3 years, spotting after sex, when wipes, today 3 tiny clots, no pain, since started after sex,just watch for about 72 hours if continues , call back and will get [place] to check IUD

## 2016-10-18 ENCOUNTER — Other Ambulatory Visit: Payer: Self-pay | Admitting: *Deleted

## 2016-10-18 ENCOUNTER — Telehealth: Payer: Self-pay | Admitting: Family Medicine

## 2016-10-18 DIAGNOSIS — M9902 Segmental and somatic dysfunction of thoracic region: Secondary | ICD-10-CM | POA: Diagnosis not present

## 2016-10-18 DIAGNOSIS — M546 Pain in thoracic spine: Secondary | ICD-10-CM | POA: Diagnosis not present

## 2016-10-18 DIAGNOSIS — S134XXA Sprain of ligaments of cervical spine, initial encounter: Secondary | ICD-10-CM | POA: Diagnosis not present

## 2016-10-18 DIAGNOSIS — S335XXA Sprain of ligaments of lumbar spine, initial encounter: Secondary | ICD-10-CM | POA: Diagnosis not present

## 2016-10-18 DIAGNOSIS — M9901 Segmental and somatic dysfunction of cervical region: Secondary | ICD-10-CM | POA: Diagnosis not present

## 2016-10-18 DIAGNOSIS — M9903 Segmental and somatic dysfunction of lumbar region: Secondary | ICD-10-CM | POA: Diagnosis not present

## 2016-10-18 MED ORDER — PROMETHAZINE HCL 25 MG PO TABS: 25.0000 mg | ORAL_TABLET | Freq: Four times a day (QID) | ORAL | 5 refills | Status: DC | PRN

## 2016-10-18 NOTE — Telephone Encounter (Signed)
Med sent to pharm. Pt notified on voicemail.  

## 2016-10-18 NOTE — Telephone Encounter (Signed)
Patient said she caught a stomach bug and has vomiting and nausea.  She is requesting Rx for Phenergan.  [facility]

## 2016-10-18 NOTE — Telephone Encounter (Signed)
Refill phen 25 mg numb 24 one q six hrs prn five ref

## 2016-10-19 ENCOUNTER — Other Ambulatory Visit (HOSPITAL_COMMUNITY): Payer: Self-pay | Admitting: Podiatry

## 2016-10-19 ENCOUNTER — Ambulatory Visit (HOSPITAL_COMMUNITY)
Admission: RE | Admit: 2016-10-19 | Discharge: 2016-10-19 | Disposition: A | Discharge status: Home / Self Care | Payer: Medicare Other | Source: Ambulatory Visit | Attending: Podiatry | Admitting: Podiatry

## 2016-10-19 ENCOUNTER — Encounter: Payer: Self-pay | Admitting: Family Medicine

## 2016-10-19 DIAGNOSIS — M79671 Pain in right foot: Secondary | ICD-10-CM | POA: Diagnosis not present

## 2016-10-19 DIAGNOSIS — T148XXA Other injury of unspecified body region, initial encounter: Secondary | ICD-10-CM | POA: Diagnosis not present

## 2016-10-19 DIAGNOSIS — S99921A Unspecified injury of right foot, initial encounter: Secondary | ICD-10-CM | POA: Diagnosis not present

## 2016-10-19 DIAGNOSIS — S93501A Unspecified sprain of right great toe, initial encounter: Secondary | ICD-10-CM | POA: Diagnosis not present

## 2016-10-19 NOTE — Telephone Encounter (Signed)
A letter was completed as requested-please be certain to inform the patient that if she ever feels woozy or drowsy again in regards to medications she should not drive area and also discontinue Hycodan cough syrup.

## 2016-10-19 NOTE — Telephone Encounter (Signed)
Discussed with pt. A letter was completed as requested and  that if she ever feels woozy or drowsy again in regards to medications she should not drive  and also discontinue Hycodan cough syrup. Pt verbalized understanding.

## 2016-10-23 DIAGNOSIS — K219 Gastro-esophageal reflux disease without esophagitis: Secondary | ICD-10-CM | POA: Diagnosis not present

## 2016-10-23 DIAGNOSIS — K5909 Other constipation: Secondary | ICD-10-CM | POA: Diagnosis not present

## 2016-10-24 ENCOUNTER — Telehealth: Payer: Self-pay | Admitting: Obstetrics & Gynecology

## 2016-10-24 NOTE — Telephone Encounter (Signed)
Pt called stating that Dr. Elonda Husky has written a letter for her lawyer, but the lawyer is stating that the letter is to scientific and would like for Dr. Elonda Husky to break it down. Please contact pt

## 2016-10-24 NOTE — Telephone Encounter (Signed)
LMOVM that per Dr Elonda Husky, he recommended that the patient's lawyer call him directly to discuss any further information needed.

## 2016-10-24 NOTE — Telephone Encounter (Signed)
I do not think it is too technical, anything less technical and it does not communicate the issue I would need a communication directly from the attorney not second hand communciation through the patient

## 2016-10-25 DIAGNOSIS — M9903 Segmental and somatic dysfunction of lumbar region: Secondary | ICD-10-CM | POA: Diagnosis not present

## 2016-10-25 DIAGNOSIS — M546 Pain in thoracic spine: Secondary | ICD-10-CM | POA: Diagnosis not present

## 2016-10-25 DIAGNOSIS — M9902 Segmental and somatic dysfunction of thoracic region: Secondary | ICD-10-CM | POA: Diagnosis not present

## 2016-10-25 DIAGNOSIS — S335XXA Sprain of ligaments of lumbar spine, initial encounter: Secondary | ICD-10-CM | POA: Diagnosis not present

## 2016-10-25 DIAGNOSIS — M9901 Segmental and somatic dysfunction of cervical region: Secondary | ICD-10-CM | POA: Diagnosis not present

## 2016-10-25 DIAGNOSIS — S134XXA Sprain of ligaments of cervical spine, initial encounter: Secondary | ICD-10-CM | POA: Diagnosis not present

## 2016-10-30 DIAGNOSIS — K311 Adult hypertrophic pyloric stenosis: Secondary | ICD-10-CM | POA: Diagnosis not present

## 2016-10-30 DIAGNOSIS — Z9884 Bariatric surgery status: Secondary | ICD-10-CM | POA: Diagnosis not present

## 2016-10-30 DIAGNOSIS — Z5181 Encounter for therapeutic drug level monitoring: Secondary | ICD-10-CM | POA: Diagnosis not present

## 2016-10-30 DIAGNOSIS — T7840XA Allergy, unspecified, initial encounter: Secondary | ICD-10-CM | POA: Diagnosis not present

## 2016-10-30 DIAGNOSIS — K210 Gastro-esophageal reflux disease with esophagitis: Secondary | ICD-10-CM | POA: Diagnosis not present

## 2016-10-30 DIAGNOSIS — R8271 Bacteriuria: Secondary | ICD-10-CM | POA: Diagnosis not present

## 2016-10-30 DIAGNOSIS — Z86718 Personal history of other venous thrombosis and embolism: Secondary | ICD-10-CM | POA: Diagnosis not present

## 2016-10-30 DIAGNOSIS — J90 Pleural effusion, not elsewhere classified: Secondary | ICD-10-CM | POA: Diagnosis not present

## 2016-10-30 DIAGNOSIS — Z79899 Other long term (current) drug therapy: Secondary | ICD-10-CM | POA: Diagnosis not present

## 2016-10-30 DIAGNOSIS — J45909 Unspecified asthma, uncomplicated: Secondary | ICD-10-CM | POA: Diagnosis not present

## 2016-10-30 DIAGNOSIS — Z9049 Acquired absence of other specified parts of digestive tract: Secondary | ICD-10-CM | POA: Diagnosis not present

## 2016-10-30 DIAGNOSIS — Z7901 Long term (current) use of anticoagulants: Secondary | ICD-10-CM | POA: Diagnosis not present

## 2016-10-30 DIAGNOSIS — R112 Nausea with vomiting, unspecified: Secondary | ICD-10-CM | POA: Diagnosis not present

## 2016-10-30 DIAGNOSIS — R1310 Dysphagia, unspecified: Secondary | ICD-10-CM | POA: Diagnosis present

## 2016-10-30 DIAGNOSIS — L309 Dermatitis, unspecified: Secondary | ICD-10-CM | POA: Diagnosis present

## 2016-10-30 DIAGNOSIS — F329 Major depressive disorder, single episode, unspecified: Secondary | ICD-10-CM | POA: Diagnosis present

## 2016-10-30 DIAGNOSIS — G522 Disorders of vagus nerve: Secondary | ICD-10-CM | POA: Diagnosis not present

## 2016-10-30 DIAGNOSIS — R918 Other nonspecific abnormal finding of lung field: Secondary | ICD-10-CM | POA: Diagnosis not present

## 2016-10-30 DIAGNOSIS — H04123 Dry eye syndrome of bilateral lacrimal glands: Secondary | ICD-10-CM | POA: Diagnosis not present

## 2016-10-30 DIAGNOSIS — F419 Anxiety disorder, unspecified: Secondary | ICD-10-CM | POA: Diagnosis present

## 2016-10-30 DIAGNOSIS — K660 Peritoneal adhesions (postprocedural) (postinfection): Secondary | ICD-10-CM | POA: Diagnosis present

## 2016-10-30 DIAGNOSIS — R197 Diarrhea, unspecified: Secondary | ICD-10-CM | POA: Diagnosis not present

## 2016-10-30 DIAGNOSIS — E039 Hypothyroidism, unspecified: Secondary | ICD-10-CM | POA: Diagnosis present

## 2016-10-30 DIAGNOSIS — G8918 Other acute postprocedural pain: Secondary | ICD-10-CM | POA: Diagnosis not present

## 2016-10-30 DIAGNOSIS — Z880 Allergy status to penicillin: Secondary | ICD-10-CM | POA: Diagnosis not present

## 2016-10-30 DIAGNOSIS — G4730 Sleep apnea, unspecified: Secondary | ICD-10-CM | POA: Diagnosis present

## 2016-11-05 ENCOUNTER — Ambulatory Visit: Payer: Medicare Other | Admitting: Nurse Practitioner

## 2016-11-06 DIAGNOSIS — Z903 Acquired absence of stomach [part of]: Secondary | ICD-10-CM | POA: Diagnosis not present

## 2016-11-06 DIAGNOSIS — Z9884 Bariatric surgery status: Secondary | ICD-10-CM | POA: Diagnosis not present

## 2016-11-06 DIAGNOSIS — M546 Pain in thoracic spine: Secondary | ICD-10-CM | POA: Diagnosis not present

## 2016-11-06 DIAGNOSIS — F329 Major depressive disorder, single episode, unspecified: Secondary | ICD-10-CM | POA: Diagnosis not present

## 2016-11-06 DIAGNOSIS — G4730 Sleep apnea, unspecified: Secondary | ICD-10-CM | POA: Diagnosis not present

## 2016-11-06 DIAGNOSIS — S335XXA Sprain of ligaments of lumbar spine, initial encounter: Secondary | ICD-10-CM | POA: Diagnosis not present

## 2016-11-06 DIAGNOSIS — D689 Coagulation defect, unspecified: Secondary | ICD-10-CM | POA: Diagnosis not present

## 2016-11-06 DIAGNOSIS — Z86718 Personal history of other venous thrombosis and embolism: Secondary | ICD-10-CM | POA: Diagnosis not present

## 2016-11-06 DIAGNOSIS — M9903 Segmental and somatic dysfunction of lumbar region: Secondary | ICD-10-CM | POA: Diagnosis not present

## 2016-11-06 DIAGNOSIS — Z48815 Encounter for surgical aftercare following surgery on the digestive system: Secondary | ICD-10-CM | POA: Diagnosis not present

## 2016-11-06 DIAGNOSIS — S134XXA Sprain of ligaments of cervical spine, initial encounter: Secondary | ICD-10-CM | POA: Diagnosis not present

## 2016-11-06 DIAGNOSIS — F419 Anxiety disorder, unspecified: Secondary | ICD-10-CM | POA: Diagnosis not present

## 2016-11-06 DIAGNOSIS — K912 Postsurgical malabsorption, not elsewhere classified: Secondary | ICD-10-CM | POA: Diagnosis not present

## 2016-11-06 DIAGNOSIS — M9902 Segmental and somatic dysfunction of thoracic region: Secondary | ICD-10-CM | POA: Diagnosis not present

## 2016-11-06 DIAGNOSIS — M9901 Segmental and somatic dysfunction of cervical region: Secondary | ICD-10-CM | POA: Diagnosis not present

## 2016-11-07 ENCOUNTER — Ambulatory Visit (INDEPENDENT_AMBULATORY_CARE_PROVIDER_SITE_OTHER): Payer: Medicare Other | Admitting: Family Medicine

## 2016-11-07 ENCOUNTER — Telehealth: Payer: Self-pay | Admitting: Family Medicine

## 2016-11-07 ENCOUNTER — Encounter: Payer: Self-pay | Admitting: Family Medicine

## 2016-11-07 VITALS — BP 110/74 | Ht 64.0 in | Wt 200.0 lb

## 2016-11-07 DIAGNOSIS — R1010 Upper abdominal pain, unspecified: Secondary | ICD-10-CM

## 2016-11-07 DIAGNOSIS — F4310 Post-traumatic stress disorder, unspecified: Secondary | ICD-10-CM | POA: Diagnosis not present

## 2016-11-07 MED ORDER — HYDROCODONE-ACETAMINOPHEN 10-325 MG PO TABS: ORAL_TABLET | ORAL | 0 refills | Status: DC

## 2016-11-07 MED ORDER — HYDROCODONE-ACETAMINOPHEN 7.5-325 MG PO TABS: ORAL_TABLET | ORAL | 0 refills | Status: DC

## 2016-11-07 NOTE — Telephone Encounter (Signed)
Debbie at [facility] was calling regarding interaction between cyclosporine eye drops and pravastatin. Consult with Dr Richardson Landry -absorption thru eye drops is very minimal and patient may continue both medications.  Debbie verbalized understanding.

## 2016-11-07 NOTE — Progress Notes (Signed)
Subjective:    Patient ID: [name], female    DOB: 09-23-1980, 36 y.o.   MRN: [id number]  HPI Follow up hospitalization at [facility].   Needs parking permit filled out.   Pt went to [facility]  Had her stomach "removed"  Had torsion of the intestines after this occurred  Unable to drive right now,  dilauidid pt was given , and told to [place], pt does not like cause it makes her loopy  Patient notes she was given hydrocodone in the past by the gastroenterologist leading up to this admission to the hospital. Next  Patient is also going to her psychiatrist today.  Patient feels that her left upper abdomen is somewhat tender at the site of one of the laparoscopic insertions. Next  No frank fever chills or vomiting.  Patient has chronically disabled with her many psychiatric and organic challenges  Review of Systems No headache, no major weight loss or weight gain, no chest pain no back pain abdominal pain no change in bowel habits complete ROS otherwise negative     Objective:   Physical Exam  Alert and oriented, vitals reAbdomen good bowel sounds. Normal appearance of incisions. No obvious erythema induration or discharge     impression status post hospitalization for gastrectomy. Also had apparently a loop in the bowel that was corrected. Very long discussion held. Patient has had a very long and rocky road with her GI tract and her psychiatric challenges. We had a frank discussion. I would like to try extremely hard not to continue her on chronic narcotics. At the same time Dilaudid is unsatisfactory and causing excess side effects. Will back off to hydrocodone ES rationale discussed. Patient may use up to 4 times a day. Hopefully over the next 10 days can begin to wean herself down. Also encourage use gummy vitamins 3 per day. Strongly encouraged follow-up with psychiatrist. Strongly encouraged to follow-up with her gastroenterologist and the surgeons. Of note seen within 48  hours i.e. the 2 business days following discharge from the hospital.   Very long discussion held.  Greater than 50% of this 40 minute face to face visit was spent in counseling and discussion and coordination of care regarding the above diagnosis/diagnosies   viewed and stable, NAD ENT-TM's and ext canals WNL bilat via otoscopic exam Soft palate, tonsils and post pharynx WNL via oropharyngeal exam Neck-symmetric, no masses; thyroid nonpalpable and nontender Pulmonary-no tachypnea or accessory muscle use; Clear without wheezes via auscultation Card--no abnrml murmurs, rhythm reg and rate WNL Carotid pulses symmetric, without bruits        Assessment & Plan:

## 2016-11-07 NOTE — Telephone Encounter (Signed)
[name] at [facility] would like a nurse to call her back to clarify patients' medication.

## 2016-11-08 DIAGNOSIS — Z48815 Encounter for surgical aftercare following surgery on the digestive system: Secondary | ICD-10-CM | POA: Diagnosis not present

## 2016-11-08 DIAGNOSIS — F419 Anxiety disorder, unspecified: Secondary | ICD-10-CM | POA: Diagnosis not present

## 2016-11-08 DIAGNOSIS — F329 Major depressive disorder, single episode, unspecified: Secondary | ICD-10-CM | POA: Diagnosis not present

## 2016-11-08 DIAGNOSIS — Z903 Acquired absence of stomach [part of]: Secondary | ICD-10-CM | POA: Diagnosis not present

## 2016-11-08 DIAGNOSIS — K912 Postsurgical malabsorption, not elsewhere classified: Secondary | ICD-10-CM | POA: Diagnosis not present

## 2016-11-08 DIAGNOSIS — D689 Coagulation defect, unspecified: Secondary | ICD-10-CM | POA: Diagnosis not present

## 2016-11-09 DIAGNOSIS — Z48815 Encounter for surgical aftercare following surgery on the digestive system: Secondary | ICD-10-CM | POA: Diagnosis not present

## 2016-11-09 DIAGNOSIS — F329 Major depressive disorder, single episode, unspecified: Secondary | ICD-10-CM | POA: Diagnosis not present

## 2016-11-09 DIAGNOSIS — F419 Anxiety disorder, unspecified: Secondary | ICD-10-CM | POA: Diagnosis not present

## 2016-11-09 DIAGNOSIS — D689 Coagulation defect, unspecified: Secondary | ICD-10-CM | POA: Diagnosis not present

## 2016-11-09 DIAGNOSIS — Z903 Acquired absence of stomach [part of]: Secondary | ICD-10-CM | POA: Diagnosis not present

## 2016-11-09 DIAGNOSIS — K912 Postsurgical malabsorption, not elsewhere classified: Secondary | ICD-10-CM | POA: Diagnosis not present

## 2016-11-12 DIAGNOSIS — D508 Other iron deficiency anemias: Secondary | ICD-10-CM | POA: Diagnosis not present

## 2016-11-12 DIAGNOSIS — Z881 Allergy status to other antibiotic agents status: Secondary | ICD-10-CM | POA: Diagnosis not present

## 2016-11-12 DIAGNOSIS — E119 Type 2 diabetes mellitus without complications: Secondary | ICD-10-CM | POA: Diagnosis not present

## 2016-11-12 DIAGNOSIS — Z7901 Long term (current) use of anticoagulants: Secondary | ICD-10-CM | POA: Diagnosis not present

## 2016-11-12 DIAGNOSIS — I10 Essential (primary) hypertension: Secondary | ICD-10-CM | POA: Diagnosis not present

## 2016-11-12 DIAGNOSIS — Z888 Allergy status to other drugs, medicaments and biological substances status: Secondary | ICD-10-CM | POA: Diagnosis not present

## 2016-11-12 DIAGNOSIS — Z79899 Other long term (current) drug therapy: Secondary | ICD-10-CM | POA: Diagnosis not present

## 2016-11-12 DIAGNOSIS — Z9884 Bariatric surgery status: Secondary | ICD-10-CM | POA: Diagnosis not present

## 2016-11-12 DIAGNOSIS — I82623 Acute embolism and thrombosis of deep veins of upper extremity, bilateral: Secondary | ICD-10-CM | POA: Diagnosis not present

## 2016-11-12 DIAGNOSIS — Z885 Allergy status to narcotic agent status: Secondary | ICD-10-CM | POA: Diagnosis not present

## 2016-11-12 DIAGNOSIS — E785 Hyperlipidemia, unspecified: Secondary | ICD-10-CM | POA: Diagnosis not present

## 2016-11-12 DIAGNOSIS — Z95828 Presence of other vascular implants and grafts: Secondary | ICD-10-CM | POA: Diagnosis not present

## 2016-11-12 DIAGNOSIS — Z6834 Body mass index (BMI) 34.0-34.9, adult: Secondary | ICD-10-CM | POA: Diagnosis not present

## 2016-11-13 DIAGNOSIS — K912 Postsurgical malabsorption, not elsewhere classified: Secondary | ICD-10-CM | POA: Diagnosis not present

## 2016-11-13 DIAGNOSIS — F329 Major depressive disorder, single episode, unspecified: Secondary | ICD-10-CM | POA: Diagnosis not present

## 2016-11-13 DIAGNOSIS — D689 Coagulation defect, unspecified: Secondary | ICD-10-CM | POA: Diagnosis not present

## 2016-11-13 DIAGNOSIS — F419 Anxiety disorder, unspecified: Secondary | ICD-10-CM | POA: Diagnosis not present

## 2016-11-13 DIAGNOSIS — Z903 Acquired absence of stomach [part of]: Secondary | ICD-10-CM | POA: Diagnosis not present

## 2016-11-13 DIAGNOSIS — Z48815 Encounter for surgical aftercare following surgery on the digestive system: Secondary | ICD-10-CM | POA: Diagnosis not present

## 2016-11-14 ENCOUNTER — Other Ambulatory Visit: Payer: Self-pay | Admitting: Family Medicine

## 2016-11-14 ENCOUNTER — Ambulatory Visit: Payer: Medicaid Other | Admitting: Nurse Practitioner

## 2016-11-14 DIAGNOSIS — Z9884 Bariatric surgery status: Secondary | ICD-10-CM | POA: Diagnosis not present

## 2016-11-14 DIAGNOSIS — Z713 Dietary counseling and surveillance: Secondary | ICD-10-CM | POA: Diagnosis not present

## 2016-11-14 DIAGNOSIS — Z6833 Body mass index (BMI) 33.0-33.9, adult: Secondary | ICD-10-CM | POA: Diagnosis not present

## 2016-11-14 DIAGNOSIS — Z48815 Encounter for surgical aftercare following surgery on the digestive system: Secondary | ICD-10-CM | POA: Diagnosis not present

## 2016-11-14 DIAGNOSIS — Z903 Acquired absence of stomach [part of]: Secondary | ICD-10-CM | POA: Diagnosis not present

## 2016-11-15 ENCOUNTER — Other Ambulatory Visit: Payer: Self-pay | Admitting: Nurse Practitioner

## 2016-11-15 ENCOUNTER — Other Ambulatory Visit: Payer: Self-pay

## 2016-11-15 MED ORDER — HYDROCODONE-ACETAMINOPHEN 5-325 MG PO TABS: ORAL_TABLET | ORAL | 0 refills | Status: DC

## 2016-11-15 NOTE — Telephone Encounter (Signed)
If pt wont wean herself, I will, she should not be having post surgical pain bad enough to warrant four times per day 7.5  decr to hyudrocod 5/325, numb 21 one up to tid prn pain MUST last 7 days

## 2016-11-19 ENCOUNTER — Other Ambulatory Visit: Payer: Self-pay | Admitting: *Deleted

## 2016-11-19 ENCOUNTER — Encounter: Payer: Self-pay | Admitting: Allergy & Immunology

## 2016-11-19 ENCOUNTER — Ambulatory Visit (INDEPENDENT_AMBULATORY_CARE_PROVIDER_SITE_OTHER): Payer: Medicare Other | Admitting: Allergy & Immunology

## 2016-11-19 VITALS — BP 110/74 | HR 89 | Temp 97.6°F | Resp 17 | Ht 63.5 in | Wt 199.0 lb

## 2016-11-19 DIAGNOSIS — L501 Idiopathic urticaria: Secondary | ICD-10-CM

## 2016-11-19 DIAGNOSIS — L600 Ingrowing nail: Secondary | ICD-10-CM | POA: Diagnosis not present

## 2016-11-19 DIAGNOSIS — M79671 Pain in right foot: Secondary | ICD-10-CM | POA: Diagnosis not present

## 2016-11-19 DIAGNOSIS — T781XXD Other adverse food reactions, not elsewhere classified, subsequent encounter: Secondary | ICD-10-CM | POA: Diagnosis not present

## 2016-11-19 MED ORDER — LEVOCETIRIZINE DIHYDROCHLORIDE 5 MG PO TABS: 10.0000 mg | ORAL_TABLET | Freq: Every day | ORAL | 5 refills | Status: DC

## 2016-11-19 MED ORDER — EPINEPHRINE 0.3 MG/0.3ML IJ SOAJ: INTRAMUSCULAR | 1 refills | Status: DC

## 2016-11-19 MED ORDER — CETIRIZINE HCL 10 MG PO TABS: 20.0000 mg | ORAL_TABLET | Freq: Every day | ORAL | 5 refills | Status: DC

## 2016-11-19 MED ORDER — MONTELUKAST SODIUM 10 MG PO TABS: 10.0000 mg | ORAL_TABLET | Freq: Every day | ORAL | 5 refills | Status: DC

## 2016-11-19 NOTE — Progress Notes (Signed)
NEW PATIENT  Date of Service/Encounter:  11/19/16  Referring provider: Mikey Kirschner, MD   Assessment:   Chronic idiopathic urticaria  Adverse food reaction - largely unknown trigger   Plan/Recommendations:   1. Chronic idiopathic urticaria - Your history does not have any "red flags" such as fevers, joint pains, or permanent skin changes that would be concerning for a more serious cause of hives.  - She has had a normal complete blood count and complete metabolic panel in the recent past. - We will get some labs to rule out serious causes of hives: tryptase level, chronic urticaria panel, ESR, and CRP. - Chronic hives are often times a self limited process and will "burn themselves out" over 6-12 months, although this is not always the case.  - In the meantime, start suppressive dosing of antihistamines:   - Morning: Xyzal (levocetirizine) 3m (two tablets)  - Evening: Zyrtec (cetirizine) 246m(two tablets) + montelukast 1021m You can change this dosing at home, decreasing the dose as needed or increasing the dosing as needed.  - If you are not tolerating the medications or are tired of taking them every day, we can start treatment with a monthly injectable medication called Xolair.   2. Adverse food reaction - Testing today showed: negative to the entire panel. - I don't think that the positive testing on the alpha-gal panel is significant. - We will give you an EpiPen just in case.  3. Return in about 5 weeks (around 12/25/2016) at 11:15 in [place] Subjective:   MerNIKI PAYMENT a 36 63o. female presenting today for evaluation of  Chief Complaint  Patient presents with  . Allergy Testing  . Allergies    MerMurrell Reddens a history of the following: Patient Active Problem List   Diagnosis Date Noted  . Interstitial cystitis 05/07/2016  . Chronic fatigue 10/12/2015  . Chronic idiopathic constipation 05/20/2015  . Postsurgical malabsorption, not  elsewhere classified 08/30/2014  . B12 deficiency 07/13/2013  . Drug withdrawal ([facility]/27/2014  . Symptoms concerning nutrition, metabolism, and development 04/24/2013  . Nausea with vomiting 04/15/2013  . 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 Polycystic ovaries 03/25/2013  . Fatty liver 02/19/2013  . Venous stasis 02/19/2013  . Peripheral edema 01/06/2013  . Chronic anticoagulation 12/30/2012  . Migraine headache 12/24/2012  . Chronic pain syndrome 09/09/2012  . 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 Esophageal reflux 08/19/2012  . Vitamin D deficiency 07/11/2012  . Insomnia secondary to depression with anxiety 07/11/2012  . Nausea alone 06/11/2012  . 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 Hypokalemia 02/27/2012  . 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 Essential hypertension 01/18/2012  . Hypomagnesemia 01/18/2012  . OCD (obsessive compulsive disorder) 01/03/2012    Class: Chronic  . Depression 01/01/2012  . Anemia 10/01/2011  . Anxiety state 08/24/2011  . Obstructive sleep apnea 08/24/2011  . Abdominal pain 08/16/2011  . Endocrine disorder 05/21/2011  . Dyspepsia and disorder of function of stomach 03/21/2011  . Chest pain, unspecified 12/13/2010  . INTESTINAL MALABSORPTION, POSTSURGICAL 07/24/2010  . DIARRHEA 07/24/2010  . ABDOMINAL PAIN-PERIUMBILICAL 07/20/08/9604 History obtained from: chart review and patient.  MerMurrell Reddens referred by LukMikey KirschnerD.     MerNatividad a 36 49o. female presenting for an allergy evaluation. She is here today with concern for alpha gal. When she eats hamburger, she gets  projectile vomiting and hives on her right arm. This is nearly immediate at times but then she will have symptoms 30-45 minutes later. She had a gastrectomy three weeks ago (had the entire stomach removed) due to a botched gastric bypass in 2006. Then she had a gastric sleeve  placed in 2013. Then she had terrible postop course with severe reflux. In any case, her reactions occurred after eating 2-3 bites. She can eat ground beef and it will occasionally happen. This does occasionally occur with pork. She has never eaten lamb and has not eaten venison in quite some time. These does not happen with chicken, however. She did have an alpha-gal panel which was essentially negative with an IgE to alpha gal level of 0.4. She has had tick bites in the past.      She does report certain foods can "clog her pouch", but since the surgery she actually feels better. She actually now can tolerate a bunch of different food allergens without a problem. She has developed the hives within the past three months. Within the past month, she has had intense itching which is not responsive to benadryl. She has not been on prednisone for the itching and the hives. She has not tried any other antihistamines. She denies history of itchy watery eyes and runny nose.   Of note, she was recently placed on a thyroid replacement. This was performed with the [facility] (Dr. Galen Manila), who manages her depression, anxiety, bipolar and OCD. She has also recently developed a latex allergy in the last six months. She developed a blistering rash with this.   She did have a history of asthma, but without her stomach, the GERD has improved and she has been off of her inhaled steroid. She has a history of recurrent deep vein thromboses which have been triggered in the past by PICC placement. She now has a port in place to avoid needing a PICC line. She is on blood thinners and is followed by a hematologist at [facility]. She is now on Eliquis rather on Xarelto. Last DVT was in 2013. She was around 250 pounds prior to her first surgery, then she lost 100 pounds. She has had a prolonged history of malabsorption and has been on feeding tubes.    Otherwise, there is no history of other atopic diseases,  including drug allergies, environmental allergies, stinging insect allergies, or urticaria. There is no significant infectious history. Vaccinations are up to date.    Past Medical History: Patient Active Problem List   Diagnosis Date Noted  . Interstitial cystitis 05/07/2016  . Chronic fatigue 10/12/2015  . Chronic idiopathic constipation 05/20/2015  . 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 B12 deficiency 07/13/2013  . Drug withdrawal ([place]) 04/30/2013  . Symptoms concerning nutrition, metabolism, and development 04/24/2013  . Nausea with vomiting 04/15/2013  . 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 Polycystic ovaries 03/25/2013  . Fatty liver 02/19/2013  . Venous stasis 02/19/2013  . Peripheral edema 01/06/2013  . Chronic anticoagulation 12/30/2012  . Migraine headache 12/24/2012  . Chronic pain syndrome 09/09/2012  . 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 Esophageal reflux 08/19/2012  . Vitamin D deficiency 07/11/2012  . Insomnia secondary to depression with anxiety 07/11/2012  . Nausea alone 06/11/2012  . 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 Hypokalemia 02/27/2012  . 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 Essential hypertension 01/18/2012  . Hypomagnesemia 01/18/2012  .  OCD (obsessive compulsive disorder) 01/03/2012    Class: Chronic  . Depression 01/01/2012  . Anemia 10/01/2011  . Anxiety state 08/24/2011  . Obstructive sleep apnea 08/24/2011  . Abdominal pain 08/16/2011  . Endocrine disorder 05/21/2011  . Dyspepsia and disorder of function of stomach 03/21/2011  . Chest pain, unspecified 12/13/2010  . INTESTINAL MALABSORPTION, POSTSURGICAL 07/24/2010  . DIARRHEA 07/24/2010  . ABDOMINAL PAIN-PERIUMBILICAL 67/59/1638    Medication List:  Allergies as of 11/19/2016      Reactions   Latex Anaphylaxis   Macrodantin [nitrofurantoin] Hives, Other (See  Comments)   Couldn't swallow on the last day of the course.   Cephalexin    Stomach pain   Cephalexin    Codeine    Skin Comfort [alum Sulfate-ca Acetate]    Nitrofurantoin Macrocrystal Rash      Medication List       Accurate as of 11/19/16 10:47 PM. Always use your most recent med list.          albuterol 108 (90 Base) MCG/ACT inhaler Commonly known as:  VENTOLIN HFA Inhale 2 puffs into the lungs 4 (four) times daily as needed for wheezing or shortness of breath.   amantadine 100 MG capsule Commonly known as:  SYMMETREL Take 100 mg by mouth 3 (three) times daily.   chlorzoxazone 500 MG tablet Commonly known as:  PARAFON TAKE 1 TABLET (500 MG TOTAL) BY MOUTH 3 (THREE) TIMES DAILY AS NEEDED FOR MUSCLE SPASMS.   cycloSPORINE 0.05 % ophthalmic emulsion Commonly known as:  RESTASIS Place 1 drop into both eyes 2 (two) times daily.   FLUoxetine 20 MG capsule Commonly known as:  PROZAC Take 3 capsules (60 mg total) by mouth daily.   fluticasone 110 MCG/ACT inhaler Commonly known as:  FLOVENT HFA Inhale 2 puffs into the lungs 2 (two) times daily.   gabapentin 600 MG tablet Commonly known as:  NEURONTIN Take 600 mg by mouth. Take one in am, one at noon, and three qhs   HYDROcodone-acetaminophen 5-325 MG tablet Commonly known as:  NORCO/VICODIN Take 1 tablet by mouth every 8 hours as needed for pain. Must last 7 days   lamoTRIgine 100 MG tablet Commonly known as:  LAMICTAL Take 100 mg by mouth 2 (two) times daily.   LATUDA 40 MG Tabs tablet Generic drug:  lurasidone Take 40 mg by mouth daily with breakfast.   lithium carbonate 300 MG CR tablet Commonly known as:  LITHOBID Take 600 mg by mouth at bedtime.   MIRENA IU by Intrauterine route.   OLANZapine 10 MG tablet Commonly known as:  ZYPREXA Take 15 mg by mouth at bedtime.   pravastatin 10 MG tablet Commonly known as:  PRAVACHOL Take 1 tablet (10 mg total) by mouth daily.   promethazine 25 MG  tablet Commonly known as:  PHENERGAN Take 1 tablet (25 mg total) by mouth every 6 (six) hours as needed for nausea or vomiting.       Birth History: non-contributory. Born at term without complications.   Developmental History: Lachrista has met all milestones on time. She has required no speech therapy, occupational therapy, or physical therapy.     Past Surgical History: Past Surgical History:  Procedure Laterality Date  . ABDOMINAL SURGERY    . ADENOIDECTOMY    . CHOLECYSTECTOMY    . ESOPHAGEAL DILATION    . GASTRIC BYPASS  2006   Dr. Toney Rakes  . KNEE SURGERY     x3  . left breast surgery  benign  . PORTACATH PLACEMENT Right 07/31/2012   [facility]  . RT Knee surgery    . TONSILECTOMY, ADENOIDECTOMY, BILATERAL MYRINGOTOMY AND TUBES     as a child  . TONSILLECTOMY    . vertical sleeve gastrectomy N/A oct 2014     Family History: Family History  Problem Relation Age of Onset  . Diabetes Mother   . Hypertension Mother   . Hyperlipidemia Mother   . Depression Mother   . Hypertension Father   . Diabetes Father   . Depression Father   . OCD Father   . Depression Sister   . Anxiety disorder Sister   . OCD Sister   . Depression Brother   . Alcohol abuse Brother   . Drug abuse Brother   . Sexual abuse Brother   . Dementia Paternal Grandmother   . Bipolar disorder Cousin   . ADD / ADHD Neg Hx   . Paranoid behavior Neg Hx   . Schizophrenia Neg Hx   . Seizures Neg Hx   . Physical abuse Neg Hx      Social History: Colene lives at home by herself.. She lives in an apartment. There is carpeting and Tylenol throughout the home. She has electric heating and central cooling. There are 2 dogs and one Home. There is no tobacco exposure. She has dust mite coverings on her bedding. Currently, she is disabled since 2012.    Review of Systems: a 14-point review of systems is pertinent for what is mentioned in HPI.  Otherwise, all other systems were  negative. Constitutional: negative other than that listed in the HPI Eyes: negative other than that listed in the HPI Ears, nose, mouth, throat, and face: negative other than that listed in the HPI Respiratory: negative other than that listed in the HPI Cardiovascular: negative other than that listed in the HPI Gastrointestinal: negative other than that listed in the HPI Genitourinary: negative other than that listed in the HPI Integument: negative other than that listed in the HPI Hematologic: negative other than that listed in the HPI Musculoskeletal: negative other than that listed in the HPI Neurological: negative other than that listed in the HPI Allergy/Immunologic: negative other than that listed in the HPI    Objective:   Blood pressure 110/74, pulse 89, temperature 97.6 F (36.4 C), resp. rate 17, height 5' 3.5" (1.613 m), weight 199 lb (90.3 kg), SpO2 97 %. Body mass index is 34.7 kg/m.   Physical Exam:  General: Alert, interactive, in no acute distress. Despite her list of medical problems, she actually looks fairly good. Her demeanor is actually quite good.  Eyes: No conjunctival injection present on the right, No conjunctival injection present on the left, PERRL bilaterally, No discharge on the right, No discharge on the left and No Horner-Trantas dots present Ears: Right TM pearly gray with normal light reflex, Left TM pearly gray with normal light reflex, Right TM intact without perforation and Left TM intact without perforation.  Nose/Throat: External nose within normal limits and septum midline, turbinates minimally edematous without discharge, post-pharynx mildly erythematous without cobblestoning in the posterior oropharynx. Tonsils 2+ without exudates Neck: Supple without thyromegaly.  Adenopathy: Shoddy bilateral anterior cervical lymphadenopathy., No enlarged lymph nodes appreciated in the occipital, axillary, epitrochlear, inguinal, or popliteal regions. and No  enlarged lymph nodes appreciated in the anterior cervical, occipital, axillary, epitrochlear, inguinal, or popliteal regions. Lungs: Clear to auscultation without wheezing, rhonchi or rales. No increased work of breathing. CV: Normal S1/S2,  no murmurs. Capillary refill <2 seconds.  Abdomen: Nondistended, nontender. No guarding or rebound tenderness. Bowel sounds absent, faint, present in all fields, hypoactive and hyperactive  Skin: Warm and dry, without lesions or rashes. Extremities:  No clubbing, cyanosis or edema. Neuro:   Grossly intact. No focal deficits appreciated. Responsive to questions.  Diagnostic studies: none  Allergy Studies:   Full Food Panel: negative to the entire panel with adewith adequate controls. Negative to Peanut, Soy, Wheat, Corn, Milk, Egg, Casein, Shellfish Mix, Fish Mix, Cashew, Tomato, Orange, Rice, [employer], [place], [place], [place], Chicken, [place] Potato, Banana, Apple, Peach, Sweet Potato, Green Pea, Strawberry, Onion, Cabbage, Carrots, Celery, Karaya Gum, Acacia (Arabic Gum), Cottonseed, Flounder, Trout, Shrimp, [place], [place], [place], [place], [place], [place], [place], [place], Cucumber, Saccharomycese Cerevisiae, [place], [place], [place], [place], [place], [place], [place] nut, [place], [place], [place], Ginger, Garlic, Black Pepper, Mustard, Barley, Oat, Rye, Sesame and Pineapple                                                                                                         Salvatore Marvel, MD [facility]

## 2016-11-19 NOTE — Patient Instructions (Addendum)
1. Chronic idiopathic urticaria - Your history does not have any "red flags" such as fevers, joint pains, or permanent skin changes that would be concerning for a more serious cause of hives.  - We will get some labs to rule out serious causes of hives: tryptase level, chronic urticaria panel, ESR, and CRP. - Chronic hives are often times a self limited process and will "burn themselves out" over 6-12 months, although this is not always the case.  - In the meantime, start suppressive dosing of antihistamines:   - Morning: Xyzal (levocetirizine) 31m (two tablets)  - Evening: Zyrtec (cetirizine) 282m(two tablets) + montelukast 1026m You can change this dosing at home, decreasing the dose as needed or increasing the dosing as needed.  - If you are not tolerating the medications or are tired of taking them every day, we can start treatment with a monthly injectable medication called Xolair.   2. Adverse food reaction - Testing today showed: negative to the entire panel. - I don't think that the positive testing on the alpha-gal panel is significant. - We will give you an EpiPen just in case.  3. Return in about 5 weeks (around 12/25/2016) at 11:15 in [place] Please inform us [place] any Emergency Department visits, hospitalizations, or changes in symptoms. Call us [place]fore going to the ED for breathing or allergy symptoms since we might be able to fit you in for a sick visit. Feel free to contact us [place] with any questions, problems, or concerns.  It was a pleasure to meet you today! Happy summer!   Websites that have reliable patient information: 1. American Academy of Asthma, Allergy, and Immunology: www.aaaai.org 2. Food Allergy Research and Education (FARE): foodallergy.org 3. Mothers of Asthmatics: http://www.asthmacommunitynetwork.org 4. American College of Allergy, Asthma, and Immunology: www.acaai.org

## 2016-11-20 DIAGNOSIS — K912 Postsurgical malabsorption, not elsewhere classified: Secondary | ICD-10-CM | POA: Diagnosis not present

## 2016-11-20 DIAGNOSIS — Z903 Acquired absence of stomach [part of]: Secondary | ICD-10-CM | POA: Diagnosis not present

## 2016-11-20 DIAGNOSIS — F329 Major depressive disorder, single episode, unspecified: Secondary | ICD-10-CM | POA: Diagnosis not present

## 2016-11-20 DIAGNOSIS — F419 Anxiety disorder, unspecified: Secondary | ICD-10-CM | POA: Diagnosis not present

## 2016-11-20 DIAGNOSIS — D689 Coagulation defect, unspecified: Secondary | ICD-10-CM | POA: Diagnosis not present

## 2016-11-20 DIAGNOSIS — Z48815 Encounter for surgical aftercare following surgery on the digestive system: Secondary | ICD-10-CM | POA: Diagnosis not present

## 2016-11-20 LAB — CP584 ZONE 3
ALLERGEN, CEDAR TREE, T6: 1.09 kU/L — AB
ALLERGEN, COMM SILVER BIRCH, T3: 0.77 kU/L — AB
Allergen, A. alternata, m6: <0.1 kU/L
Allergen, C. Herbarum, M2: <0.1 kU/L
Allergen, Mucor Racemosus, M4: <0.1 kU/L
Allergen, Mulberry, t76: <0.1 kU/L
Allergen, Oak,t7: 1.67 kU/L — ABNORMAL HIGH
Allergen, S. Botryosum, m10: <0.1 kU/L
Aspergillus fumigatus, m3: <0.1 kU/L
BERMUDA GRASS: 0.15 kU/L — AB
Bahia Grass: 0.35 kU/L — ABNORMAL HIGH
Box Elder IgE: <0.1 kU/L
COMMON RAGWEED: 2.14 kU/L — AB
Cat Dander: 0.75 kU/L — ABNORMAL HIGH
Cockroach: 0.17 kU/L — ABNORMAL HIGH
Dog Dander: 0.4 kU/L — ABNORMAL HIGH
Elm IgE: <0.1 kU/L
Johnson Grass: 0.3 kU/L — ABNORMAL HIGH
Meadow Grass: 2.31 kU/L — ABNORMAL HIGH
Pecan/Hickory Tree IgE: 0.9 kU/L — ABNORMAL HIGH
Plantain: <0.1 kU/L
Rough Pigweed  IgE: <0.1 kU/L

## 2016-11-20 LAB — SEDIMENTATION RATE: SED RATE: 6 mm/h (ref 0–20)

## 2016-11-20 LAB — TRYPTASE: Tryptase: 8.4 ug/L (ref <11)

## 2016-11-20 LAB — C-REACTIVE PROTEIN: CRP: 1.2 mg/L (ref <8.0)

## 2016-11-21 ENCOUNTER — Telehealth: Payer: Self-pay | Admitting: Family Medicine

## 2016-11-21 NOTE — Telephone Encounter (Signed)
FYI  Received a call from [place] with [facility] PA department  States that the patient contacted them asking for a quantity limit override for her Hydromorphone (Dilaudid), rep states insurance allows for 6 per day & pt is requesting 8 per day   States patient gave our office information & insurance called here to get dosing for the prescription.   Autumn & I looked extensively at the last OV note and med list  I then explained to the rep from [facility] that we did not prescribe this medication for the patient therefore we can not give requested information or approve a larger quantity, they will need to contact the patient's surgeon or the patient to inquire about the prescribing physician

## 2016-11-21 NOTE — Telephone Encounter (Signed)
Nurses call [name][facility] [place], I started pt on hydrocodone when she claimed dilaudid was causing bad side effects. Now i'm hearing pt wants more dilaudid and in fact ;lpikely got another rx????? (likely from [place]??) call pharm andf ask if pt has approached them with another rx for dilaudid refill

## 2016-11-21 NOTE — Telephone Encounter (Signed)
11/06/16-Dilaudid #40-no more than 8 a day----------- 11/07/16 -Hydrocodone #40-------------------------------- per [facility] [place]             11/20/17-Hydrocodone #21-------------------------------

## 2016-11-21 NOTE — Telephone Encounter (Signed)
Patient stated that it was a mistake. She is not on Dilaudid and just got out of the hospital again yesterday and they must have just called about all the meds. Patient states she is only on Hydrocodone from Dr Richardson Landry.

## 2016-11-22 DIAGNOSIS — K912 Postsurgical malabsorption, not elsewhere classified: Secondary | ICD-10-CM | POA: Diagnosis not present

## 2016-11-22 DIAGNOSIS — F419 Anxiety disorder, unspecified: Secondary | ICD-10-CM | POA: Diagnosis not present

## 2016-11-22 DIAGNOSIS — F329 Major depressive disorder, single episode, unspecified: Secondary | ICD-10-CM | POA: Diagnosis not present

## 2016-11-22 DIAGNOSIS — Z903 Acquired absence of stomach [part of]: Secondary | ICD-10-CM | POA: Diagnosis not present

## 2016-11-22 DIAGNOSIS — D689 Coagulation defect, unspecified: Secondary | ICD-10-CM | POA: Diagnosis not present

## 2016-11-22 DIAGNOSIS — Z48815 Encounter for surgical aftercare following surgery on the digestive system: Secondary | ICD-10-CM | POA: Diagnosis not present

## 2016-11-23 DIAGNOSIS — Z713 Dietary counseling and surveillance: Secondary | ICD-10-CM | POA: Diagnosis not present

## 2016-11-23 DIAGNOSIS — E638 Other specified nutritional deficiencies: Secondary | ICD-10-CM | POA: Diagnosis not present

## 2016-11-23 DIAGNOSIS — Z6833 Body mass index (BMI) 33.0-33.9, adult: Secondary | ICD-10-CM | POA: Diagnosis not present

## 2016-11-23 DIAGNOSIS — Z9884 Bariatric surgery status: Secondary | ICD-10-CM | POA: Diagnosis not present

## 2016-11-23 DIAGNOSIS — Z48815 Encounter for surgical aftercare following surgery on the digestive system: Secondary | ICD-10-CM | POA: Diagnosis not present

## 2016-11-23 DIAGNOSIS — E669 Obesity, unspecified: Secondary | ICD-10-CM | POA: Diagnosis not present

## 2016-11-26 ENCOUNTER — Encounter (HOSPITAL_COMMUNITY): Payer: Self-pay

## 2016-11-26 LAB — CP CHRONIC URTICARIA INDEX PANEL
Histamine Release: <16 % (ref <16)
THYROGLOBULIN AB: 1 [IU]/mL (ref <2)
TSH: 2.81 mIU/L

## 2016-11-29 ENCOUNTER — Telehealth: Payer: Self-pay | Admitting: Family Medicine

## 2016-11-29 NOTE — Telephone Encounter (Signed)
[employer] faxed over a a request for additional info regarding patient's Qvar.  This medication is non-formulary on the patient's plan.  They are wanting to know if any of the following formulary alternative(s) would be as effective for the patient?  Advair Diskus inhaler Advair HFA inhaler Aerospan inhaler Arnuity Ellipta inhaler Breo Ellipta inhaler Flovent Diskus inhaler Flovent HFA inhaler Pulmicort Flexhaler inhaler Symbicort inhaler  See form in blue folder.

## 2016-11-29 NOTE — Telephone Encounter (Signed)
Switch bk to flovent 110 mcg two puffs bid

## 2016-11-30 MED ORDER — FLUTICASONE PROPIONATE HFA 110 MCG/ACT IN AERO: 2.0000 | INHALATION_SPRAY | Freq: Two times a day (BID) | RESPIRATORY_TRACT | 5 refills | Status: DC

## 2016-11-30 NOTE — Telephone Encounter (Signed)
Flovent sent into pharmacy

## 2016-12-03 ENCOUNTER — Telehealth: Payer: Self-pay | Admitting: Nurse Practitioner

## 2016-12-03 DIAGNOSIS — Z95828 Presence of other vascular implants and grafts: Secondary | ICD-10-CM | POA: Diagnosis not present

## 2016-12-03 DIAGNOSIS — F329 Major depressive disorder, single episode, unspecified: Secondary | ICD-10-CM | POA: Diagnosis not present

## 2016-12-03 DIAGNOSIS — E538 Deficiency of other specified B group vitamins: Secondary | ICD-10-CM | POA: Diagnosis not present

## 2016-12-03 DIAGNOSIS — Z8639 Personal history of other endocrine, nutritional and metabolic disease: Secondary | ICD-10-CM | POA: Diagnosis not present

## 2016-12-03 DIAGNOSIS — Z7901 Long term (current) use of anticoagulants: Secondary | ICD-10-CM | POA: Diagnosis not present

## 2016-12-03 DIAGNOSIS — E559 Vitamin D deficiency, unspecified: Secondary | ICD-10-CM | POA: Diagnosis not present

## 2016-12-03 DIAGNOSIS — D508 Other iron deficiency anemias: Secondary | ICD-10-CM | POA: Diagnosis not present

## 2016-12-03 DIAGNOSIS — Z9884 Bariatric surgery status: Secondary | ICD-10-CM | POA: Diagnosis not present

## 2016-12-03 DIAGNOSIS — I824Y3 Acute embolism and thrombosis of unspecified deep veins of proximal lower extremity, bilateral: Secondary | ICD-10-CM | POA: Diagnosis not present

## 2016-12-03 DIAGNOSIS — E756 Lipid storage disorder, unspecified: Secondary | ICD-10-CM | POA: Diagnosis not present

## 2016-12-03 DIAGNOSIS — K912 Postsurgical malabsorption, not elsewhere classified: Secondary | ICD-10-CM | POA: Diagnosis not present

## 2016-12-03 DIAGNOSIS — F419 Anxiety disorder, unspecified: Secondary | ICD-10-CM | POA: Diagnosis not present

## 2016-12-03 DIAGNOSIS — K219 Gastro-esophageal reflux disease without esophagitis: Secondary | ICD-10-CM | POA: Diagnosis not present

## 2016-12-03 NOTE — Telephone Encounter (Signed)
Pt states she had b12 level drawn today at [facility] and she had b12 injection today. Pt wants to know if you can get results from [facility]

## 2016-12-03 NOTE — Telephone Encounter (Signed)
Patient's doctor at [facility] recommended for her to start getting her B12 shots again monthly at her PCP.  She wants to know if we would be willing to start doing this for her again?

## 2016-12-03 NOTE — Telephone Encounter (Signed)
Please obtain a B12 level so we can have a baseline. Then we can set up shots if needed at this time. Her last B12 level was very high.

## 2016-12-03 NOTE — Telephone Encounter (Signed)
There is no recent B12 level in Epic or in the [facility] blood work

## 2016-12-07 ENCOUNTER — Telehealth: Payer: Self-pay | Admitting: Allergy & Immunology

## 2016-12-07 MED ORDER — PREDNISONE 10 MG PO TABS: ORAL_TABLET | ORAL | 0 refills | Status: DC

## 2016-12-07 MED ORDER — TRIAMCINOLONE ACETONIDE 0.025 % EX OINT: 1.0000 "application " | TOPICAL_OINTMENT | Freq: Two times a day (BID) | CUTANEOUS | 0 refills | Status: DC

## 2016-12-07 MED ORDER — RANITIDINE HCL 300 MG PO TABS
300.0000 mg | ORAL_TABLET | Freq: Every day | ORAL | 0 refills | Status: DC
Start: 2016-12-07 — End: 2016-12-30

## 2016-12-07 NOTE — Telephone Encounter (Signed)
This weekend patient broke out in hives Patient has taken meds as prescribed along with adding benedryl Can an adjustment be made to make her meds stronger, or can a cream be called in for her to use when break outs happen?? Please call if anything is called in or changed so that the patient is aware [facility] [place] [facility]

## 2016-12-07 NOTE — Telephone Encounter (Signed)
Reviewed her last clinic note. Currently she should be taking Xyzal (levocetirizine) 10mg  (two tablets) in the morning and Zyrtec (cetirizine) 20mg  (two tablets) + montelukast 10mg  in the evening. I would recommend adding on Zantac (ranitidine) 300mg  every evening. She can also increase the Xyzal to three tablets in the morning and add Zyrtec to three tablets in the evening.   We can send in a triamcinolone 0.1% ointment to use as needed, but the topical steroids are not as effective as the systemic medications.   If she is desperate, we can send in a short course of prednisone 10mg  twice daily for five days.   Also, she should strongly consider starting Xolair if it is this bad. We do have samples if she is interested.   Salvatore Marvel, MD [facility] [place]

## 2016-12-07 NOTE — Telephone Encounter (Signed)
Spoke with patient and she would like to try and increase her medications per Dr Ernst Bowler orders have been sent to the pharmacy. She is thinking about doing the Xolair and would like to speak to him more about it at her next office visit.

## 2016-12-07 NOTE — Telephone Encounter (Signed)
Please advise 

## 2016-12-10 ENCOUNTER — Encounter: Payer: Self-pay | Admitting: Nurse Practitioner

## 2016-12-10 ENCOUNTER — Ambulatory Visit (INDEPENDENT_AMBULATORY_CARE_PROVIDER_SITE_OTHER): Payer: Medicare Other | Admitting: Nurse Practitioner

## 2016-12-10 ENCOUNTER — Telehealth: Payer: Self-pay | Admitting: Family Medicine

## 2016-12-10 VITALS — BP 114/78 | Temp 98.7°F | Wt 196.1 lb

## 2016-12-10 DIAGNOSIS — R309 Painful micturition, unspecified: Secondary | ICD-10-CM

## 2016-12-10 DIAGNOSIS — Z113 Encounter for screening for infections with a predominantly sexual mode of transmission: Secondary | ICD-10-CM | POA: Diagnosis not present

## 2016-12-10 DIAGNOSIS — M9901 Segmental and somatic dysfunction of cervical region: Secondary | ICD-10-CM | POA: Diagnosis not present

## 2016-12-10 DIAGNOSIS — R350 Frequency of micturition: Secondary | ICD-10-CM | POA: Diagnosis not present

## 2016-12-10 DIAGNOSIS — S134XXA Sprain of ligaments of cervical spine, initial encounter: Secondary | ICD-10-CM | POA: Diagnosis not present

## 2016-12-10 DIAGNOSIS — S335XXA Sprain of ligaments of lumbar spine, initial encounter: Secondary | ICD-10-CM | POA: Diagnosis not present

## 2016-12-10 DIAGNOSIS — M9902 Segmental and somatic dysfunction of thoracic region: Secondary | ICD-10-CM | POA: Diagnosis not present

## 2016-12-10 DIAGNOSIS — M546 Pain in thoracic spine: Secondary | ICD-10-CM | POA: Diagnosis not present

## 2016-12-10 DIAGNOSIS — M9903 Segmental and somatic dysfunction of lumbar region: Secondary | ICD-10-CM | POA: Diagnosis not present

## 2016-12-10 LAB — POCT GLUCOSE (DEVICE FOR HOME USE): Glucose Fasting, POC: 76 mg/dL (ref 70–99)

## 2016-12-10 LAB — POCT URINALYSIS DIPSTICK
LEUKOCYTES UA: NEGATIVE
PH UA: 5 (ref 5.0–8.0)
RBC UA: NEGATIVE

## 2016-12-10 LAB — POCT UA - MICROSCOPIC ONLY
BACTERIA, U MICROSCOPIC: NEGATIVE
RBC, urine, microscopic: NEGATIVE
WBC, Ur, HPF, POC: NEGATIVE

## 2016-12-10 NOTE — Telephone Encounter (Signed)
Message Error

## 2016-12-10 NOTE — Progress Notes (Signed)
Subjective:  Presents for complaints of urinary symptoms that began on 7/5. Urgency and frequency. Pain with urination. No obvious blood. No fever. No nausea vomiting. Bowels are normal limit. Has a history of interstitial cystitis. Same sexual partner. Has Mirena for birth control. No vaginal bleeding. 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 States she is doing great. Eating regular foods.  Objective:   BP 114/78   Temp 98.7 F (37.1 C) (Oral)   Wt 196 lb 2 oz (89 kg)   BMI 34.20 kg/m  NAD. Alert, oriented. Lungs clear. Heart regular rate rhythm. Mild right CVA area tenderness. Abdomen soft nondistended with mild tenderness towards the right pelvic area. No rebound or guarding. No obvious masses. Results for orders placed or performed in visit on 12/10/16  POCT urinalysis dipstick  Result Value Ref Range   Color, UA Yellow    Clarity, UA Clear    Glucose, UA     Bilirubin, UA     Ketones, UA     Spec Grav, UA >=1.030 (A) 1.010 - 1.025   Blood, UA Negative    pH, UA 5.0 5.0 - 8.0   Protein, UA     Urobilinogen, UA  0.2 or 1.0 E.U./dL   Nitrite, UA     Leukocytes, UA Negative Negative  POCT UA - Microscopic Only  Result Value Ref Range   WBC, Ur, HPF, POC neg    RBC, urine, microscopic neg    Bacteria, U Microscopic neg    Mucus, UA     Epithelial cells, urine per micros very rare    Crystals, Ur, HPF, POC     Casts, Ur, LPF, POC     Yeast, UA    POCT Glucose (Device for Home Use)  Result Value Ref Range   Glucose Fasting, POC 76 70 - 99 mg/dL   POC Glucose  70 - 99 mg/dl     Assessment:  Painful urination - Plan: POCT urinalysis dipstick, POCT UA - Microscopic Only, Urine Culture, CANCELED: Urine Culture  Routine screening for STI (sexually transmitted infection) - Plan: Chlamydia/Gonococcus/Trichomonas, NAA  Urinary frequency - Plan: POCT Glucose (Device for Home Use)    Plan:  Patient has been on multiple antibiotics, would like to  avoid using them at this point if not necessary. Urine culture and STD screening pending. Warning signs reviewed. Follow-up based on test results, call back sooner if symptoms worsen. Restart [place] as directed for IC. Call back by the end of the week if no improvement, sooner if worse.

## 2016-12-11 LAB — CHLAMYDIA/GONOCOCCUS/TRICHOMONAS, NAA
Chlamydia by NAA: NEGATIVE
Gonococcus by NAA: NEGATIVE
TRICH VAG BY NAA: NEGATIVE

## 2016-12-11 NOTE — Addendum Note (Signed)
Addended by: Valentina Shaggy on: 12/11/2016 01:49 PM   Modules accepted: Level of Service

## 2016-12-12 ENCOUNTER — Other Ambulatory Visit: Payer: Self-pay

## 2016-12-12 ENCOUNTER — Other Ambulatory Visit: Payer: Self-pay | Admitting: Nurse Practitioner

## 2016-12-12 DIAGNOSIS — Z452 Encounter for adjustment and management of vascular access device: Secondary | ICD-10-CM | POA: Diagnosis not present

## 2016-12-12 DIAGNOSIS — T82594A Other mechanical complication of infusion catheter, initial encounter: Secondary | ICD-10-CM | POA: Diagnosis not present

## 2016-12-12 DIAGNOSIS — Z6833 Body mass index (BMI) 33.0-33.9, adult: Secondary | ICD-10-CM | POA: Diagnosis not present

## 2016-12-12 DIAGNOSIS — Z48815 Encounter for surgical aftercare following surgery on the digestive system: Secondary | ICD-10-CM | POA: Diagnosis not present

## 2016-12-12 DIAGNOSIS — K912 Postsurgical malabsorption, not elsewhere classified: Secondary | ICD-10-CM | POA: Diagnosis not present

## 2016-12-12 DIAGNOSIS — Z9884 Bariatric surgery status: Secondary | ICD-10-CM | POA: Diagnosis not present

## 2016-12-12 DIAGNOSIS — E663 Overweight: Secondary | ICD-10-CM | POA: Diagnosis not present

## 2016-12-12 DIAGNOSIS — Z95828 Presence of other vascular implants and grafts: Secondary | ICD-10-CM | POA: Diagnosis not present

## 2016-12-12 DIAGNOSIS — Z713 Dietary counseling and surveillance: Secondary | ICD-10-CM | POA: Diagnosis not present

## 2016-12-12 LAB — URINE CULTURE

## 2016-12-12 MED ORDER — EPINEPHRINE 0.3 MG/0.3ML IJ SOAJ: 0.3000 mg | Freq: Once | INTRAMUSCULAR | 2 refills | Status: AC

## 2016-12-12 NOTE — Telephone Encounter (Signed)
Pt was sent an Auvi-Q, however her insurance will not cover it. [facility]

## 2016-12-13 ENCOUNTER — Other Ambulatory Visit: Payer: Self-pay | Admitting: Nurse Practitioner

## 2016-12-13 MED ORDER — CIPROFLOXACIN HCL 500 MG PO TABS: 500.0000 mg | ORAL_TABLET | Freq: Two times a day (BID) | ORAL | 0 refills | Status: DC

## 2016-12-13 MED ORDER — TRAMADOL HCL 50 MG PO TABS: 50.0000 mg | ORAL_TABLET | Freq: Three times a day (TID) | ORAL | 0 refills | Status: DC | PRN

## 2016-12-13 NOTE — Progress Notes (Signed)
Had port a cath surgery yesterday. Would like mild pain med to help with post pain. Advised to take Tylenol. Contact surgeon if pain persists. One time order for tramadol.

## 2016-12-13 NOTE — Telephone Encounter (Signed)
Spoke with her Psychologist, sport and exercise. She will start OTC sublingual B12 supplement instead of injection.

## 2016-12-14 ENCOUNTER — Telehealth: Payer: Self-pay | Admitting: *Deleted

## 2016-12-15 ENCOUNTER — Other Ambulatory Visit: Payer: Self-pay | Admitting: Nurse Practitioner

## 2016-12-15 DIAGNOSIS — K911 Postgastric surgery syndromes: Secondary | ICD-10-CM

## 2016-12-15 DIAGNOSIS — K912 Postsurgical malabsorption, not elsewhere classified: Secondary | ICD-10-CM

## 2016-12-15 DIAGNOSIS — R5382 Chronic fatigue, unspecified: Secondary | ICD-10-CM

## 2016-12-15 DIAGNOSIS — E559 Vitamin D deficiency, unspecified: Secondary | ICD-10-CM

## 2016-12-15 DIAGNOSIS — E876 Hypokalemia: Secondary | ICD-10-CM

## 2016-12-15 DIAGNOSIS — D539 Nutritional anemia, unspecified: Secondary | ICD-10-CM

## 2016-12-15 DIAGNOSIS — E538 Deficiency of other specified B group vitamins: Secondary | ICD-10-CM

## 2016-12-15 DIAGNOSIS — K760 Fatty (change of) liver, not elsewhere classified: Secondary | ICD-10-CM

## 2016-12-17 DIAGNOSIS — M9903 Segmental and somatic dysfunction of lumbar region: Secondary | ICD-10-CM | POA: Diagnosis not present

## 2016-12-17 DIAGNOSIS — S134XXA Sprain of ligaments of cervical spine, initial encounter: Secondary | ICD-10-CM | POA: Diagnosis not present

## 2016-12-17 DIAGNOSIS — M9901 Segmental and somatic dysfunction of cervical region: Secondary | ICD-10-CM | POA: Diagnosis not present

## 2016-12-17 DIAGNOSIS — M546 Pain in thoracic spine: Secondary | ICD-10-CM | POA: Diagnosis not present

## 2016-12-17 DIAGNOSIS — S335XXA Sprain of ligaments of lumbar spine, initial encounter: Secondary | ICD-10-CM | POA: Diagnosis not present

## 2016-12-17 DIAGNOSIS — M9902 Segmental and somatic dysfunction of thoracic region: Secondary | ICD-10-CM | POA: Diagnosis not present

## 2016-12-17 NOTE — Progress Notes (Signed)
You can order it and change to other but she will have to take the papers with her to lab to make sure they draw it since it wont drop electronically

## 2016-12-24 DIAGNOSIS — M9902 Segmental and somatic dysfunction of thoracic region: Secondary | ICD-10-CM | POA: Diagnosis not present

## 2016-12-24 DIAGNOSIS — M9903 Segmental and somatic dysfunction of lumbar region: Secondary | ICD-10-CM | POA: Diagnosis not present

## 2016-12-24 DIAGNOSIS — S134XXA Sprain of ligaments of cervical spine, initial encounter: Secondary | ICD-10-CM | POA: Diagnosis not present

## 2016-12-24 DIAGNOSIS — M546 Pain in thoracic spine: Secondary | ICD-10-CM | POA: Diagnosis not present

## 2016-12-24 DIAGNOSIS — S335XXA Sprain of ligaments of lumbar spine, initial encounter: Secondary | ICD-10-CM | POA: Diagnosis not present

## 2016-12-24 DIAGNOSIS — M9901 Segmental and somatic dysfunction of cervical region: Secondary | ICD-10-CM | POA: Diagnosis not present

## 2016-12-25 ENCOUNTER — Encounter: Payer: Self-pay | Admitting: Allergy & Immunology

## 2016-12-25 ENCOUNTER — Encounter (HOSPITAL_COMMUNITY): Payer: Self-pay

## 2016-12-25 ENCOUNTER — Ambulatory Visit (INDEPENDENT_AMBULATORY_CARE_PROVIDER_SITE_OTHER): Payer: Medicare Other | Admitting: Allergy & Immunology

## 2016-12-25 VITALS — BP 110/90 | HR 80 | Temp 98.0°F | Resp 16

## 2016-12-25 DIAGNOSIS — J3089 Other allergic rhinitis: Secondary | ICD-10-CM

## 2016-12-25 DIAGNOSIS — J302 Other seasonal allergic rhinitis: Secondary | ICD-10-CM | POA: Insufficient documentation

## 2016-12-25 DIAGNOSIS — L501 Idiopathic urticaria: Secondary | ICD-10-CM

## 2016-12-25 NOTE — Patient Instructions (Addendum)
1. Chronic idiopathic urticaria - with normal lab workup - Your history does not have any "red flags" such as fevers, joint pains, or permanent skin changes that would be concerning for a more serious cause of hives.  - We will get some labs to rule out serious causes of hives: tryptase level, chronic urticaria panel, ESR, and CRP. - Chronic hives are often times a self limited process and will "burn themselves out" over 6-12 months, although this is not always the case.  - In the meantime, start suppressive dosing of antihistamines:   - Morning: Xyzal (levocetirizine) 53m (two tablets)  - Evening: add Zyrtec (cetirizine) 232m(two tablets)  -  Come back next week for your first injection.  2. Return in about 6 months (around 06/27/2017).   Please inform usKoreaf any Emergency Department visits, hospitalizations, or changes in symptoms. Call usKoreaefore going to the ED for breathing or allergy symptoms since we might be able to fit you in for a sick visit. Feel free to contact usKoreanytime with any questions, problems, or concerns.  It was a pleasure to see you again today! Happy summer!   Websites that have reliable patient information: 1. American Academy of Asthma, Allergy, and Immunology: www.aaaai.org 2. Food Allergy Research and Education (FARE): foodallergy.org 3. Mothers of Asthmatics: http://www.asthmacommunitynetwork.org 4. American College of Allergy, Asthma, and Immunology: www.acaai.org

## 2016-12-25 NOTE — Progress Notes (Signed)
FOLLOW UP  Date of Service/Encounter:  12/25/16   Assessment:   Chronic idiopathic urticaria  Seasonal and perennial allergic rhinitis (cat, dog, grasses, cockroach, trees, and ragweed)   Plan/Recommendations:   1. Chronic idiopathic urticaria - with normal lab workup - Since there has been no improvement with the antihistamines alone, we will advance treatment to Xolair. - Risks/benefits of Xolair discussed with the patient and she would like to proceed with this.  - Consent obtained. - We will plan to give the first injection in one week (we can use a sample).  - EpiPen has already been prescribed, but she would like AuviQ instead. - We will try to get the [facility] approved.  - Chronic hives are often times a self limited process and will "burn themselves out" over 6-12 months, although this is not always the case.  - Continue with suppressive dosing of antihistamines:   - Morning: Xyzal (levocetirizine) 10mg  (two tablets)  - Evening: add Zyrtec (cetirizine) 20mg  (two tablets)  - We can peel off medications as tolerated once the Xolair is on board.   2. Return in about 6 months (around 06/27/2017).    Subjective:   [name] is a 36 y.o. female presenting today for follow up of  Chief Complaint  Patient presents with  . Urticaria    [name] has a history of the following: Patient Active Problem List   Diagnosis Date Noted  . Seasonal and perennial allergic rhinitis 12/25/2016  . Chronic idiopathic urticaria 12/25/2016  . Interstitial cystitis 05/07/2016  . Chronic fatigue 10/12/2015  . Chronic idiopathic constipation 05/20/2015  . 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 B12 deficiency 07/13/2013  . Drug withdrawal ([place]) 04/30/2013  . Symptoms concerning nutrition, metabolism, and development 04/24/2013  . Nausea with vomiting 04/15/2013  . 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 Polycystic ovaries 03/25/2013  .  Fatty liver 02/19/2013  . Venous stasis 02/19/2013  . Peripheral edema 01/06/2013  . Chronic anticoagulation 12/30/2012  . Migraine headache 12/24/2012  . Chronic pain syndrome 09/09/2012  . 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 Esophageal reflux 08/19/2012  . Vitamin D deficiency 07/11/2012  . Insomnia secondary to depression with anxiety 07/11/2012  . Nausea alone 06/11/2012  . 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 Hypokalemia 02/27/2012  . 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 Essential hypertension 01/18/2012  . Hypomagnesemia 01/18/2012  . OCD (obsessive compulsive disorder) 01/03/2012    Class: Chronic  . Depression 01/01/2012  . Anemia 10/01/2011  . Anxiety state 08/24/2011  . Obstructive sleep apnea 08/24/2011  . Abdominal pain 08/16/2011  . Endocrine disorder 05/21/2011  . Dyspepsia and disorder of function of stomach 03/21/2011  . Chest pain, unspecified 12/13/2010  . INTESTINAL MALABSORPTION, POSTSURGICAL 07/24/2010  . DIARRHEA 07/24/2010  . ABDOMINAL PAIN-PERIUMBILICAL 44/31/5400    History obtained from: chart review and patient.  [name] was referred by Mikey Kirschner, MD.     [name] is a 36 y.o. female presenting for a follow up visit. She has a very competent gave past medical history including chronic malabsorption requiring feeding tubes as well as TPN therapy as well as chronic urticaria. At the last visit, she was also endorsing vomiting and hives following the ingestion. Prior to her visit, she had an alpha gal panel that showed a low positive IgE level to alpha gal, but negative IgE to mammalian meats. We tested to  our entire food panel, and it was negative. Because of the chronic hives, we did do a lab workup which was notable for a normal CBC, serum tryptase, and chronic urticaria panel. An environmental allergy panel showed sensitizations to cat, dog, grasses,  cockroach, trees, and ragweed. We recommended suppressive doses of antihistamines with two Xyzal in the morning and two Zyrtec at night in addition to Singulair. We did provide an EpiPen for a precaution, but I did recommend that she reintroduce beef and other mammalian meats into her diet.   Since the last visit, she has not done well. She is taking levocetirizine in the morning as well as ranitidine at night. She does use cetirizine at night, but has been less compliant with this. She is unsure if she is taking Singulair at night. In any case, she continues to have hives on a daily basis despite this. The hives do eventually leave clear skin, although interestingly she does have more permanent skin changes on her right arm. It seems that the more pruritic the rash is, the more likely it is to leave a permanent change. She has had no fevers or joint pains with these episodes. She cannot seem to find any triggers for these episodes.   She has reintroduce her diet without any recurrent episodes of hives and vomiting. She has not needed her EpiPen as we prescribed to her at the last visit. She has not noticed any other triggers for her allergic reactions.  Otherwise, there have been no changes to her past medical history, surgical history, family history, or social history. 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 She had her port replaced approximately 2 weeks ago because the other one had come loose from her pleural wall. She is now not allowed to lift anything heavier than 2 pounds. She does work as a Designer, industrial/product now, as she was able to keep up with the children.    Review of Systems: a 14-point review of systems is pertinent for what is mentioned in HPI.  Otherwise, all other systems were negative. Constitutional: negative other than that listed in the HPI Eyes: negative other than that listed in the HPI Ears, nose, mouth, throat, and face: negative other than that listed in the  HPI Respiratory: negative other than that listed in the HPI Cardiovascular: negative other than that listed in the HPI Gastrointestinal: negative other than that listed in the HPI Genitourinary: negative other than that listed in the HPI Integument: negative other than that listed in the HPI Hematologic: negative other than that listed in the HPI Musculoskeletal: negative other than that listed in the HPI Neurological: negative other than that listed in the HPI Allergy/Immunologic: negative other than that listed in the HPI    Objective:   Blood pressure 110/90, pulse 80, temperature 98 F (36.7 C), temperature source Oral, resp. rate 16, SpO2 98 %. There is no height or weight on file to calculate BMI.   Physical Exam:  General: Alert, interactive, in no acute distress. Pleasant female. Smiling. Eyes: No conjunctival injection present on the right, No conjunctival injection present on the left, PERRL bilaterally, No discharge on the right, No discharge on the left and No Horner-Trantas dots present Ears: Right TM pearly gray with normal light reflex, Left TM pearly gray with normal light reflex, Right TM intact without perforation and Left TM intact without perforation.  Nose/Throat: External nose within normal limits and septum midline, turbinates edematous without discharge, post-pharynx mildly erythematous  without cobblestoning in the posterior oropharynx. Tonsils 2+ without exudates Neck: Supple without thyromegaly. Lungs: Clear to auscultation without wheezing, rhonchi or rales. No increased work of breathing. CV: Normal S1/S2, no murmurs. Capillary refill <2 seconds.  Skin: Scattered erythematous urticarial type lesions primarily located bilateral upper arms , nonvesicular. Neuro:   Grossly intact. No focal deficits appreciated. Responsive to questions.   Diagnostic studies: none     Salvatore Marvel, MD [place] [place]

## 2016-12-26 ENCOUNTER — Other Ambulatory Visit: Payer: Self-pay

## 2016-12-26 MED ORDER — LEVOCETIRIZINE DIHYDROCHLORIDE 5 MG PO TABS: 10.0000 mg | ORAL_TABLET | Freq: Every day | ORAL | 3 refills | Status: DC

## 2016-12-26 MED ORDER — CETIRIZINE HCL 10 MG PO TABS: 20.0000 mg | ORAL_TABLET | Freq: Every day | ORAL | 2 refills | Status: DC

## 2016-12-26 NOTE — Telephone Encounter (Signed)
Scripts sent into pharmacy. 

## 2016-12-26 NOTE — Telephone Encounter (Signed)
Patient was seen yesterday and she stated that her meds were not sent in. She would also like her meds  [facility] supply [facility]

## 2016-12-27 ENCOUNTER — Telehealth: Payer: Self-pay

## 2016-12-27 NOTE — Telephone Encounter (Signed)
Patient called and wanted to know exactly what antihistamine she is suppose to be taking. I informed patient that she is suppose to take 2 Xyzal (levocetirizine) in the morning and 2 Zyrtec (Cetirizine) in the evening. Then patient asked about when is she suppose to come in for her Xolair. I informed her that she just signed paper work on Tuesday and Lynelle Smoke will get back with her as soon as she gets the approval from her insurance.

## 2016-12-28 DIAGNOSIS — F4310 Post-traumatic stress disorder, unspecified: Secondary | ICD-10-CM | POA: Diagnosis not present

## 2016-12-30 ENCOUNTER — Other Ambulatory Visit: Payer: Self-pay | Admitting: Allergy & Immunology

## 2017-01-02 ENCOUNTER — Other Ambulatory Visit: Payer: Self-pay | Admitting: Allergy & Immunology

## 2017-01-07 ENCOUNTER — Ambulatory Visit (INDEPENDENT_AMBULATORY_CARE_PROVIDER_SITE_OTHER): Payer: Medicare Other | Admitting: Nurse Practitioner

## 2017-01-07 ENCOUNTER — Encounter: Payer: Self-pay | Admitting: Nurse Practitioner

## 2017-01-07 VITALS — BP 116/80 | Temp 98.5°F | Ht 64.0 in | Wt 200.2 lb

## 2017-01-07 DIAGNOSIS — N10 Acute tubulo-interstitial nephritis: Secondary | ICD-10-CM | POA: Diagnosis not present

## 2017-01-07 DIAGNOSIS — Z113 Encounter for screening for infections with a predominantly sexual mode of transmission: Secondary | ICD-10-CM

## 2017-01-07 LAB — POCT URINALYSIS DIPSTICK
Blood, UA: NEGATIVE
Leukocytes, UA: NEGATIVE
Spec Grav, UA: 1.015 (ref 1.010–1.025)
pH, UA: 5 (ref 5.0–8.0)

## 2017-01-07 LAB — POCT UA - MICROSCOPIC ONLY: RBC, URINE, MICROSCOPIC: NEGATIVE

## 2017-01-07 MED ORDER — CIPROFLOXACIN HCL 500 MG PO TABS: 500.0000 mg | ORAL_TABLET | Freq: Two times a day (BID) | ORAL | 0 refills | Status: DC

## 2017-01-07 NOTE — Progress Notes (Signed)
Subjective:  Presents for complaints of urinary symptoms for the past week. Dysuria urgency frequency and dribbling small amounts. Low back pain. More on the right. No fever. Had some vomiting but patient thinks is related to her diet after gastric surgery. No nausea. Taking fluids well. Has a complex urinary history including interstitial cystitis. Is having [facility]ic [facility] on [facility]. Has a new sexual partner. No vaginal discharge or pelvic pain.  Objective:   BP 116/80   Temp 98.5 F (36.9 C) (Oral)   Ht 5\' 4"  (1.626 m)   Wt 200 lb 4 oz (90.8 kg)   BMI 34.37 kg/m  NAD. Alert, oriented. Lungs clear. Heart regular rate rhythm. Positive right CVA tenderness, mild right flank tenderness. Abdomen soft nondistended with suprapubic area discomfort on exam. Results for orders placed or performed in visit on 01/07/17  POCT urinalysis dipstick  Result Value Ref Range   Color, UA Yellow    Clarity, UA cloudy    Glucose, UA     Bilirubin, UA     Ketones, UA     Spec Grav, UA 1.015 1.010 - 1.025   Blood, UA Negative    pH, UA 5.0 5.0 - 8.0   Protein, UA     Urobilinogen, UA  0.2 or 1.0 E.U./dL   Nitrite, UA     Leukocytes, UA Negative Negative  POCT UA - Microscopic Only  Result Value Ref Range   WBC, Ur, HPF, POC rare    RBC, urine, microscopic neg    Bacteria, U Microscopic occas    Mucus, UA     Epithelial cells, urine per micros multiple    Crystals, Ur, HPF, POC     Casts, Ur, LPF, POC     Yeast, UA      Assessment:  Pyelonephritis, acute - Plan: POCT urinalysis dipstick, POCT UA - Microscopic Only, CANCELED: POCT urinalysis dipstick  Routine screening for STI (sexually transmitted infection) - Plan: Chlamydia/Gonococcus/Trichomonas, NAA    Plan:   Meds ordered this encounter  Medications  . ciprofloxacin (CIPRO) 500 MG tablet    Sig: Take 1 tablet (500 mg total) by mouth 2 (two) times daily.    Dispense:  14 tablet    Refill:  0    Order Specific Question:    Supervising Provider    Answer:   Maggie Font    May take AZO for 48 hours and discontinue. Increase clear fluid intake. Warning signs reviewed. Call back in 72 hours if no improvement, sooner if worse. Otherwise follow-up with urology as planned.

## 2017-01-08 ENCOUNTER — Encounter: Payer: Self-pay | Admitting: Nurse Practitioner

## 2017-01-08 ENCOUNTER — Telehealth: Payer: Self-pay | Admitting: *Deleted

## 2017-01-08 DIAGNOSIS — M9902 Segmental and somatic dysfunction of thoracic region: Secondary | ICD-10-CM | POA: Diagnosis not present

## 2017-01-08 DIAGNOSIS — M546 Pain in thoracic spine: Secondary | ICD-10-CM | POA: Diagnosis not present

## 2017-01-08 DIAGNOSIS — S134XXA Sprain of ligaments of cervical spine, initial encounter: Secondary | ICD-10-CM | POA: Diagnosis not present

## 2017-01-08 DIAGNOSIS — M9901 Segmental and somatic dysfunction of cervical region: Secondary | ICD-10-CM | POA: Diagnosis not present

## 2017-01-08 DIAGNOSIS — M9903 Segmental and somatic dysfunction of lumbar region: Secondary | ICD-10-CM | POA: Diagnosis not present

## 2017-01-08 DIAGNOSIS — S335XXA Sprain of ligaments of lumbar spine, initial encounter: Secondary | ICD-10-CM | POA: Diagnosis not present

## 2017-01-08 LAB — CHLAMYDIA/GONOCOCCUS/TRICHOMONAS, NAA
CHLAMYDIA BY NAA: NEGATIVE
Gonococcus by NAA: NEGATIVE
Trich vag by NAA: NEGATIVE

## 2017-01-08 NOTE — Telephone Encounter (Signed)
Pt states she is not taking qvar. Message sent to pharm that pt is not taking med.

## 2017-01-08 NOTE — Telephone Encounter (Signed)
Left message to return with pt. Fax from [employer]. qvar not covered. Insurance will cover breo, advair, symbicort. Called pt because qvar is not on med list.

## 2017-01-09 ENCOUNTER — Other Ambulatory Visit: Payer: Self-pay | Admitting: *Deleted

## 2017-01-09 ENCOUNTER — Encounter: Payer: Self-pay | Admitting: Obstetrics & Gynecology

## 2017-01-09 DIAGNOSIS — D539 Nutritional anemia, unspecified: Secondary | ICD-10-CM | POA: Diagnosis not present

## 2017-01-09 DIAGNOSIS — E519 Thiamine deficiency, unspecified: Secondary | ICD-10-CM

## 2017-01-09 DIAGNOSIS — R5382 Chronic fatigue, unspecified: Secondary | ICD-10-CM | POA: Diagnosis not present

## 2017-01-09 DIAGNOSIS — K911 Postgastric surgery syndromes: Secondary | ICD-10-CM | POA: Diagnosis not present

## 2017-01-09 DIAGNOSIS — K912 Postsurgical malabsorption, not elsewhere classified: Secondary | ICD-10-CM | POA: Diagnosis not present

## 2017-01-09 NOTE — Progress Notes (Signed)
I will check in the morning to be sure I signed off on labs needing PA. As far as whole blood thiamine, please order and send her a note through my chart to pick up paperwork per Autumn's advice. Thanks.

## 2017-01-09 NOTE — Progress Notes (Signed)
Spoke with patient and informed her that Thiamine was ordered per Hoyle Sauer. Patient verbalized understanding.

## 2017-01-10 ENCOUNTER — Encounter (HOSPITAL_COMMUNITY)
Admission: RE | Admit: 2017-01-10 | Discharge: 2017-01-10 | Disposition: A | Discharge status: Home / Self Care | Payer: Medicare Other | Source: Ambulatory Visit | Attending: Hematology and Oncology | Admitting: Hematology and Oncology

## 2017-01-10 DIAGNOSIS — Z452 Encounter for adjustment and management of vascular access device: Secondary | ICD-10-CM | POA: Diagnosis not present

## 2017-01-10 LAB — CBC WITH DIFFERENTIAL/PLATELET
Basophils Absolute: 0 10*3/uL (ref 0.0–0.2)
Basos: 0 %
EOS (ABSOLUTE): 0.2 10*3/uL (ref 0.0–0.4)
Eos: 3 %
Hematocrit: 41.3 % (ref 34.0–46.6)
Hemoglobin: 13.6 g/dL (ref 11.1–15.9)
Immature Grans (Abs): 0 10*3/uL (ref 0.0–0.1)
Immature Granulocytes: 0 %
LYMPHS ABS: 3.1 10*3/uL (ref 0.7–3.1)
LYMPHS: 33 %
MCH: 30.8 pg (ref 26.6–33.0)
MCHC: 32.9 g/dL (ref 31.5–35.7)
MCV: 93 fL (ref 79–97)
MONOCYTES: 3 %
Monocytes Absolute: 0.3 10*3/uL (ref 0.1–0.9)
NEUTROS ABS: 5.7 10*3/uL (ref 1.4–7.0)
Neutrophils: 61 %
PLATELETS: 282 10*3/uL (ref 150–379)
RBC: 4.42 x10E6/uL (ref 3.77–5.28)
RDW: 13.8 % (ref 12.3–15.4)
WBC: 9.3 10*3/uL (ref 3.4–10.8)

## 2017-01-10 LAB — FERRITIN: FERRITIN: 156 ng/mL — AB (ref 15–150)

## 2017-01-10 LAB — TSH: TSH: 1.62 u[IU]/mL (ref 0.450–4.500)

## 2017-01-10 LAB — VITAMIN B12: Vitamin B-12: 411 pg/mL (ref 232–1245)

## 2017-01-10 MED ORDER — SODIUM CHLORIDE 0.9% FLUSH
10.0000 mL | INTRAVENOUS | Status: AC | PRN
  Administered 2017-01-10: 10 mL

## 2017-01-10 MED ORDER — SODIUM CHLORIDE 0.9% FLUSH
INTRAVENOUS | Status: AC
  Filled 2017-01-10: qty 10

## 2017-01-10 MED ORDER — HEPARIN SOD (PORK) LOCK FLUSH 100 UNIT/ML IV SOLN
INTRAVENOUS | Status: AC
  Filled 2017-01-10: qty 5

## 2017-01-10 MED ORDER — HEPARIN SOD (PORK) LOCK FLUSH 100 UNIT/ML IV SOLN
500.0000 [IU] | INTRAVENOUS | Status: AC | PRN
  Administered 2017-01-10: 500 [IU]

## 2017-01-12 ENCOUNTER — Other Ambulatory Visit: Payer: Self-pay | Admitting: Nurse Practitioner

## 2017-01-12 LAB — VITAMIN B1: Thiamine: 201.7 nmol/L — ABNORMAL HIGH (ref 66.5–200.0)

## 2017-01-14 ENCOUNTER — Telehealth: Payer: Self-pay | Admitting: Allergy & Immunology

## 2017-01-14 ENCOUNTER — Telehealth: Payer: Self-pay

## 2017-01-14 DIAGNOSIS — T63481A Toxic effect of venom of other arthropod, accidental (unintentional), initial encounter: Secondary | ICD-10-CM | POA: Insufficient documentation

## 2017-01-14 DIAGNOSIS — T782XXA Anaphylactic shock, unspecified, initial encounter: Secondary | ICD-10-CM

## 2017-01-14 DIAGNOSIS — T7840XA Allergy, unspecified, initial encounter: Secondary | ICD-10-CM

## 2017-01-14 NOTE — Telephone Encounter (Signed)
Patient called in regards to a wasp sting. It is still in the size of a golf ball and hot to the touch. Patinet is using benadryl every 8 hours and using triamcinolone. Patient is coming in tomorrow for her allergy injections. What else can she do about about the sting? Should patient still come in tomorrow to get her allergy injections? Please advise

## 2017-01-14 NOTE — Telephone Encounter (Signed)
Patient was stung by a wasp Patient knows to double up on zyrtec per nurse call Patient is wondering if something can be called in for the itsh - topical or tablet [facility] [place]

## 2017-01-14 NOTE — Telephone Encounter (Signed)
That is impressive. I would add cetirizine 20mg  (two tablets) twice daily for the next 3-5 days. I would hold off on shots for now to avoid complicating the clinical picture. She can come in on Friday or Monday for the next shot. With the history of a large local reaction, we can consider getting labs to look for evidence of stinging insect allergies, if she is interested.  Salvatore Marvel, MD [facility] [place] [facility] [place]

## 2017-01-14 NOTE — Telephone Encounter (Signed)
Duplicate. Pt to be seen in [place] for immunotherapy 01/14/2017

## 2017-01-14 NOTE — Telephone Encounter (Signed)
Called patient. Patient does want lab work done. She will pick up lab in [place] tomorrow.

## 2017-01-15 ENCOUNTER — Telehealth: Payer: Self-pay

## 2017-01-15 ENCOUNTER — Ambulatory Visit: Payer: Self-pay

## 2017-01-15 DIAGNOSIS — T7840XA Allergy, unspecified, initial encounter: Secondary | ICD-10-CM

## 2017-01-15 NOTE — Telephone Encounter (Signed)
Patient is here to pick up labs

## 2017-01-15 NOTE — Addendum Note (Signed)
Addended by: Lucrezia Starch I on: 01/15/2017 08:57 AM   Modules accepted: Orders

## 2017-01-16 DIAGNOSIS — K912 Postsurgical malabsorption, not elsewhere classified: Secondary | ICD-10-CM | POA: Diagnosis not present

## 2017-01-16 DIAGNOSIS — Z9884 Bariatric surgery status: Secondary | ICD-10-CM | POA: Diagnosis not present

## 2017-01-16 DIAGNOSIS — Z713 Dietary counseling and surveillance: Secondary | ICD-10-CM | POA: Diagnosis not present

## 2017-01-17 NOTE — Telephone Encounter (Signed)
Patient has called asking for lab results. Please call

## 2017-01-17 NOTE — Telephone Encounter (Signed)
Called patient advised that we do not have lab results it can take 5-7 business days. When resulted by the doctor we will call her patient verbalized understanding

## 2017-01-18 DIAGNOSIS — G4700 Insomnia, unspecified: Secondary | ICD-10-CM | POA: Diagnosis not present

## 2017-01-18 DIAGNOSIS — N3281 Overactive bladder: Secondary | ICD-10-CM | POA: Diagnosis not present

## 2017-01-18 DIAGNOSIS — Z6834 Body mass index (BMI) 34.0-34.9, adult: Secondary | ICD-10-CM | POA: Diagnosis not present

## 2017-01-18 DIAGNOSIS — Z86718 Personal history of other venous thrombosis and embolism: Secondary | ICD-10-CM | POA: Diagnosis not present

## 2017-01-18 DIAGNOSIS — Z7901 Long term (current) use of anticoagulants: Secondary | ICD-10-CM | POA: Diagnosis not present

## 2017-01-18 DIAGNOSIS — G4733 Obstructive sleep apnea (adult) (pediatric): Secondary | ICD-10-CM | POA: Diagnosis not present

## 2017-01-18 DIAGNOSIS — Z7951 Long term (current) use of inhaled steroids: Secondary | ICD-10-CM | POA: Diagnosis not present

## 2017-01-18 DIAGNOSIS — N3010 Interstitial cystitis (chronic) without hematuria: Secondary | ICD-10-CM | POA: Diagnosis not present

## 2017-01-18 DIAGNOSIS — K219 Gastro-esophageal reflux disease without esophagitis: Secondary | ICD-10-CM | POA: Diagnosis not present

## 2017-01-18 DIAGNOSIS — N3090 Cystitis, unspecified without hematuria: Secondary | ICD-10-CM | POA: Diagnosis not present

## 2017-01-18 DIAGNOSIS — Z23 Encounter for immunization: Secondary | ICD-10-CM | POA: Diagnosis not present

## 2017-01-18 DIAGNOSIS — Z881 Allergy status to other antibiotic agents status: Secondary | ICD-10-CM | POA: Diagnosis not present

## 2017-01-18 DIAGNOSIS — Z975 Presence of (intrauterine) contraceptive device: Secondary | ICD-10-CM | POA: Diagnosis not present

## 2017-01-18 DIAGNOSIS — Z79899 Other long term (current) drug therapy: Secondary | ICD-10-CM | POA: Diagnosis not present

## 2017-01-18 DIAGNOSIS — Z91040 Latex allergy status: Secondary | ICD-10-CM | POA: Diagnosis not present

## 2017-01-18 DIAGNOSIS — M791 Myalgia: Secondary | ICD-10-CM | POA: Diagnosis not present

## 2017-01-18 DIAGNOSIS — Z885 Allergy status to narcotic agent status: Secondary | ICD-10-CM | POA: Diagnosis not present

## 2017-01-18 DIAGNOSIS — N189 Chronic kidney disease, unspecified: Secondary | ICD-10-CM | POA: Diagnosis not present

## 2017-01-18 DIAGNOSIS — Z9884 Bariatric surgery status: Secondary | ICD-10-CM | POA: Diagnosis not present

## 2017-01-18 DIAGNOSIS — Z91048 Other nonmedicinal substance allergy status: Secondary | ICD-10-CM | POA: Diagnosis not present

## 2017-01-18 DIAGNOSIS — I129 Hypertensive chronic kidney disease with stage 1 through stage 4 chronic kidney disease, or unspecified chronic kidney disease: Secondary | ICD-10-CM | POA: Diagnosis not present

## 2017-01-18 DIAGNOSIS — Z9189 Other specified personal risk factors, not elsewhere classified: Secondary | ICD-10-CM | POA: Diagnosis not present

## 2017-01-18 DIAGNOSIS — Z79890 Hormone replacement therapy (postmenopausal): Secondary | ICD-10-CM | POA: Diagnosis not present

## 2017-01-18 DIAGNOSIS — F428 Other obsessive-compulsive disorder: Secondary | ICD-10-CM | POA: Diagnosis not present

## 2017-01-18 LAB — ALLERGEN HYMENOPTERA PANEL
Bumblebee: <0.1 kU/L
Hornet, White Face, IgE: <0.1 kU/L
Hornet, Yellow, IgE: <0.1 kU/L
Paper Wasp IgE: <0.1 kU/L

## 2017-01-19 ENCOUNTER — Encounter: Payer: Self-pay | Admitting: Obstetrics & Gynecology

## 2017-01-21 ENCOUNTER — Other Ambulatory Visit: Payer: Self-pay | Admitting: Obstetrics & Gynecology

## 2017-01-21 MED ORDER — OMALIZUMAB 150 MG ~~LOC~~ SOLR
300.0000 mg | SUBCUTANEOUS | Status: DC
  Administered 2017-01-22 – 2018-05-21 (×7): 300 mg via SUBCUTANEOUS

## 2017-01-21 NOTE — Addendum Note (Signed)
Addended by: Hartley Barefoot on: 01/21/2017 12:25 PM   Modules accepted: Orders

## 2017-01-22 ENCOUNTER — Ambulatory Visit (INDEPENDENT_AMBULATORY_CARE_PROVIDER_SITE_OTHER): Payer: Medicare Other | Admitting: *Deleted

## 2017-01-22 DIAGNOSIS — L501 Idiopathic urticaria: Secondary | ICD-10-CM

## 2017-01-23 ENCOUNTER — Other Ambulatory Visit: Payer: Self-pay | Admitting: Nurse Practitioner

## 2017-01-24 ENCOUNTER — Encounter: Payer: Self-pay | Admitting: Obstetrics & Gynecology

## 2017-01-24 ENCOUNTER — Telehealth: Payer: Self-pay | Admitting: Family Medicine

## 2017-01-24 ENCOUNTER — Other Ambulatory Visit: Payer: Self-pay | Admitting: Family Medicine

## 2017-01-24 NOTE — Telephone Encounter (Signed)
Pt is needing to start b12 shots per [facility]. Pt is also needing a pneumonia shot. Pt would like to get these as soon as possible. Please advise.

## 2017-01-24 NOTE — Telephone Encounter (Signed)
Needs o v to disc b 12 and pneum vaccines, unless pt is going to get the b 12s at [facility]

## 2017-01-24 NOTE — Telephone Encounter (Signed)
[facility] and they said she was at high risk for the Flu because they already had 4 cases at [facility] and she needed to go ahead and get her shot. She got the flu shot there but they said she needed the pneumonia shot also and she needs a prescription called in for that. [facility]

## 2017-01-24 NOTE — Telephone Encounter (Signed)
Last seen 01/07/17

## 2017-01-24 NOTE — Telephone Encounter (Signed)
Spoke with patient and informed her per Dr.Steve Luking- Will need an office visit to discuss vitamin b12 vaccine and pneumo vaccine. Patient verbalized understanding.

## 2017-01-25 ENCOUNTER — Other Ambulatory Visit: Payer: Self-pay | Admitting: Family Medicine

## 2017-01-25 NOTE — Progress Notes (Signed)
Immunotherapy   Patient Details  Name: [name] MRN: [id number] Date of Birth: 28-May-1981  01/22/2017  [name] : Patient started Xolair injections.   Following schedule: 300 mg  Frequency: Every 4 weeks Epi-Pen: Patient does have Epipen and has been instructed on proper use. Consent signed and patient instructions given. Patient waited 60 minutes after injection and no reaction noted.  Maree Erie 01/25/2017, 5:16 PM

## 2017-01-27 ENCOUNTER — Emergency Department (HOSPITAL_COMMUNITY)
Admission: EM | Admit: 2017-01-27 | Discharge: 2017-01-27 | Disposition: A | Discharge status: Home / Self Care | Payer: Medicare Other | Attending: Emergency Medicine | Admitting: Emergency Medicine

## 2017-01-27 ENCOUNTER — Encounter (HOSPITAL_COMMUNITY): Payer: Self-pay

## 2017-01-27 ENCOUNTER — Other Ambulatory Visit: Payer: Self-pay | Admitting: Allergy & Immunology

## 2017-01-27 DIAGNOSIS — Z91040 Latex allergy status: Secondary | ICD-10-CM | POA: Insufficient documentation

## 2017-01-27 DIAGNOSIS — I10 Essential (primary) hypertension: Secondary | ICD-10-CM | POA: Insufficient documentation

## 2017-01-27 DIAGNOSIS — Z79899 Other long term (current) drug therapy: Secondary | ICD-10-CM | POA: Diagnosis not present

## 2017-01-27 DIAGNOSIS — L509 Urticaria, unspecified: Secondary | ICD-10-CM | POA: Diagnosis present

## 2017-01-27 DIAGNOSIS — T7840XA Allergy, unspecified, initial encounter: Secondary | ICD-10-CM | POA: Insufficient documentation

## 2017-01-27 DIAGNOSIS — R51 Headache: Secondary | ICD-10-CM | POA: Insufficient documentation

## 2017-01-27 MED ORDER — PREDNISONE 20 MG PO TABS: ORAL_TABLET | ORAL | 0 refills | Status: DC

## 2017-01-27 MED ORDER — ACETAMINOPHEN 325 MG PO TABS
650.0000 mg | ORAL_TABLET | Freq: Once | ORAL | Status: AC
  Administered 2017-01-27: 650 mg via ORAL
  Filled 2017-01-27: qty 2

## 2017-01-27 MED ORDER — METHYLPREDNISOLONE SODIUM SUCC 125 MG IJ SOLR
125.0000 mg | Freq: Once | INTRAMUSCULAR | Status: AC
  Administered 2017-01-27: 125 mg via INTRAVENOUS
  Filled 2017-01-27: qty 2
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
## 2017-01-27 NOTE — Discharge Instructions (Signed)
If symptoms worsen, use your EpiPen and return to the emergency Department. Otherwise contact your allergist tomorrow to let him know that you were here tonight. He may want to see you in the office

## 2017-01-27 NOTE — ED Notes (Signed)
Pt states her itching is worsening and rash is spreading.  No rash noted other than redness from pt scratching.

## 2017-01-27 NOTE — ED Notes (Signed)
Pt states she is itching uncontrollably.  No hives or rash noted.  Pt states she took Benadryl 100mg  pta with no relief.  Pt in no distress, no oral swelling, no shortness of breath, no wheezing.

## 2017-01-27 NOTE — ED Triage Notes (Signed)
Pt reports she was outside and started breaking out in hives. Used her epi  Pen, 2 pepcids and 4 benadryl. Pt reports her tongue feels thick and difficulty swallowing

## 2017-01-28 ENCOUNTER — Telehealth: Payer: Self-pay

## 2017-01-28 NOTE — Telephone Encounter (Signed)
Patient called to let us know that she had an anaphylactic reaction yesterday and was seen at the emergency room. She wanted to let us know what was going on. The ED note is in the chart. Please review and let me know if you have any further recommendations.

## 2017-01-29 ENCOUNTER — Encounter: Payer: Self-pay | Admitting: *Deleted

## 2017-01-29 ENCOUNTER — Encounter: Payer: Self-pay | Admitting: Obstetrics & Gynecology

## 2017-01-29 ENCOUNTER — Other Ambulatory Visit: Payer: Medicare Other | Admitting: Pediatrics

## 2017-01-29 DIAGNOSIS — F4310 Post-traumatic stress disorder, unspecified: Secondary | ICD-10-CM | POA: Diagnosis not present

## 2017-01-29 NOTE — Telephone Encounter (Signed)
Spoke to patient appt made for 03/19/17 @ 2:00pm. Patient would like a mychart message to be sent advising medications to be stopped prior to appt. Writer will send message.

## 2017-01-29 NOTE — Telephone Encounter (Signed)
Noted. We cannot do testing at this time anyway since her reaction was so recent. I would like to see her again before January 2019 (current appointment scheduled). Can we schedule her sometime in early October?   Thanks, Salvatore Marvel, MD Allergy and [place] [facility]

## 2017-01-30 ENCOUNTER — Encounter: Payer: Self-pay | Admitting: Nurse Practitioner

## 2017-01-30 ENCOUNTER — Ambulatory Visit (INDEPENDENT_AMBULATORY_CARE_PROVIDER_SITE_OTHER): Payer: Medicare Other | Admitting: Nurse Practitioner

## 2017-01-30 VITALS — BP 120/84 | Ht 64.0 in | Wt 198.0 lb

## 2017-01-30 DIAGNOSIS — E785 Hyperlipidemia, unspecified: Secondary | ICD-10-CM | POA: Diagnosis not present

## 2017-01-30 DIAGNOSIS — F19982 Other psychoactive substance use, unspecified with psychoactive substance-induced sleep disorder: Secondary | ICD-10-CM

## 2017-01-30 DIAGNOSIS — Z79899 Other long term (current) drug therapy: Secondary | ICD-10-CM | POA: Diagnosis not present

## 2017-01-30 MED ORDER — TEMAZEPAM 30 MG PO CAPS: 30.0000 mg | ORAL_CAPSULE | Freq: Every evening | ORAL | 0 refills | Status: DC | PRN

## 2017-01-31 ENCOUNTER — Encounter: Payer: Self-pay | Admitting: Nurse Practitioner

## 2017-01-31 DIAGNOSIS — S134XXA Sprain of ligaments of cervical spine, initial encounter: Secondary | ICD-10-CM | POA: Diagnosis not present

## 2017-01-31 DIAGNOSIS — M9902 Segmental and somatic dysfunction of thoracic region: Secondary | ICD-10-CM | POA: Diagnosis not present

## 2017-01-31 DIAGNOSIS — S335XXA Sprain of ligaments of lumbar spine, initial encounter: Secondary | ICD-10-CM | POA: Diagnosis not present

## 2017-01-31 DIAGNOSIS — M546 Pain in thoracic spine: Secondary | ICD-10-CM | POA: Diagnosis not present

## 2017-01-31 DIAGNOSIS — M9901 Segmental and somatic dysfunction of cervical region: Secondary | ICD-10-CM | POA: Diagnosis not present

## 2017-01-31 DIAGNOSIS — M9903 Segmental and somatic dysfunction of lumbar region: Secondary | ICD-10-CM | POA: Diagnosis not present

## 2017-01-31 NOTE — Progress Notes (Signed)
Subjective:  Presents for c/o insomnia for the past 4 days after being seen at ED for severe allergic reaction. Took her Epi Pen then received large dose of steroids which has caused significant insomnia. No relief with Benadryl, Hydroxyzine or Klonopin. Questions whether she needs pneumovax or B12 injections. Her surgeon has recommended B12 sublingual instead of regular injections. Has followed up with weight loss team at [facility] including dietician.  Objective:   BP 120/84   Ht 5\' 4"  (1.626 m)   Wt 198 lb (89.8 kg)   BMI 33.99 kg/m  NAD. Alert, oriented. Lungs clear. Heart RRR.   Assessment:   Problem List Items Addressed This Visit      Other   Hyperlipidemia   Relevant Orders   Lipid panel    Other Visit Diagnoses    Drug-induced insomnia ([place])    -  Primary   High risk medication use       Relevant Orders   Hepatic function panel       Plan:   Meds ordered this encounter  Medications  . temazepam (RESTORIL) 30 MG capsule    Sig: Take 1 capsule (30 mg total) by mouth at bedtime as needed for sleep. Do not take within 4 hours of Klonopin.    Dispense:  30 capsule    Refill:  0    Order Specific Question:   Supervising Provider    Answer:   Mikey Kirschner [[id number]]   Trial of Temazepam. Do not take within 4 hours of Klonopin or other sedating medication. Last B 12 level was normal and stable. Continue sublingual. Pneumovax is UTD. Recommend flu vaccine. Gets regular physicals.  Return if symptoms worsen or fail to improve.

## 2017-02-06 DIAGNOSIS — R69 Illness, unspecified: Secondary | ICD-10-CM | POA: Diagnosis not present

## 2017-02-06 DIAGNOSIS — Z79899 Other long term (current) drug therapy: Secondary | ICD-10-CM | POA: Diagnosis not present

## 2017-02-10 ENCOUNTER — Other Ambulatory Visit: Payer: Self-pay | Admitting: Family Medicine

## 2017-02-11 ENCOUNTER — Encounter (HOSPITAL_COMMUNITY)
Admission: RE | Admit: 2017-02-11 | Discharge: 2017-02-11 | Disposition: A | Discharge status: Home / Self Care | Payer: Medicare HMO | Source: Ambulatory Visit | Attending: Hematology and Oncology | Admitting: Hematology and Oncology

## 2017-02-11 DIAGNOSIS — Z452 Encounter for adjustment and management of vascular access device: Secondary | ICD-10-CM | POA: Diagnosis present

## 2017-02-11 DIAGNOSIS — E785 Hyperlipidemia, unspecified: Secondary | ICD-10-CM | POA: Diagnosis not present

## 2017-02-11 LAB — LIPID PANEL
CHOLESTEROL: 187 mg/dL (ref 0–200)
HDL: 72 mg/dL (ref >40)
LDL Cholesterol: 100 mg/dL — ABNORMAL HIGH (ref 0–99)
TRIGLYCERIDES: 73 mg/dL (ref <150)
Total CHOL/HDL Ratio: 2.6 RATIO
VLDL: 15 mg/dL (ref 0–40)

## 2017-02-11 LAB — HEPATIC FUNCTION PANEL
ALBUMIN: 3.8 g/dL (ref 3.5–5.0)
ALK PHOS: 78 U/L (ref 38–126)
ALT: 21 U/L (ref 14–54)
AST: 19 U/L (ref 15–41)
BILIRUBIN TOTAL: 0.3 mg/dL (ref 0.3–1.2)
Bilirubin, Direct: <0.1 mg/dL — ABNORMAL LOW (ref 0.1–0.5)
Total Protein: 6.8 g/dL (ref 6.5–8.1)

## 2017-02-11 MED ORDER — SODIUM CHLORIDE 0.9% FLUSH
10.0000 mL | INTRAVENOUS | Status: AC | PRN
  Administered 2017-02-11: 10 mL

## 2017-02-11 MED ORDER — SODIUM CHLORIDE 0.9% FLUSH
INTRAVENOUS | Status: AC
  Filled 2017-02-11: qty 30

## 2017-02-11 MED ORDER — SODIUM CHLORIDE 0.9% FLUSH
INTRAVENOUS | Status: AC
  Filled 2017-02-11: qty 10

## 2017-02-11 MED ORDER — HEPARIN SOD (PORK) LOCK FLUSH 100 UNIT/ML IV SOLN
INTRAVENOUS | Status: AC
  Filled 2017-02-11: qty 5

## 2017-02-11 MED ORDER — HEPARIN SOD (PORK) LOCK FLUSH 100 UNIT/ML IV SOLN
500.0000 [IU] | INTRAVENOUS | Status: AC | PRN
  Administered 2017-02-11: 500 [IU]

## 2017-02-11 NOTE — Telephone Encounter (Signed)
Left message return call 02/11/17

## 2017-02-11 NOTE — Progress Notes (Signed)
Porta-cath accesses. Excellent blood return. Attempted to obtain blood for labs. No blood returned. Flushed with saline. Continues with no blood returned. Porta-cath deaccessed. No bleeding. Porta-cath reaccessed with new needle and kit. Continues to irrigate well with saline with no blood return results. Porta-cath then flushed with heparin and gauze and paper tape applied. No bleeding at site. Tolerated well. Pt stated that this would happen sometimes when port was flushed at [facility]. Blood sample obtained with venipuncture draw right arm. Sent to lab for results. Tolerated well.

## 2017-02-11 NOTE — Progress Notes (Signed)
Results for [name], [name] (MRN [id number]) as of 02/11/2017 13:52  Ref. Range 02/11/2017 10:42  Alkaline Phosphatase Latest Ref Range: 38 - 126 U/L 78  Albumin Latest Ref Range: 3.5 - 5.0 g/dL 3.8  AST Latest Ref Range: 15 - 41 U/L 19  ALT Latest Ref Range: 14 - 54 U/L 21  Total Protein Latest Ref Range: 6.5 - 8.1 g/dL 6.8  Bilirubin, Direct Latest Ref Range: 0.1 - 0.5 mg/dL <0.1 (L)  Indirect Bilirubin Latest Ref Range: 0.3 - 0.9 mg/dL NOT CALCULATED  Total Bilirubin Latest Ref Range: 0.3 - 1.2 mg/dL 0.3  Total CHOL/HDL Ratio Latest Units: RATIO 2.6  Cholesterol Latest Ref Range: 0 - 200 mg/dL 187  HDL Cholesterol Latest Ref Range: >40 mg/dL 72  LDL (calc) Latest Ref Range: 0 - 99 mg/dL 100 (H)  Triglycerides Latest Ref Range: <150 mg/dL 73  VLDL Latest Ref Range: 0 - 40 mg/dL 15

## 2017-02-12 ENCOUNTER — Telehealth: Payer: Self-pay | Admitting: Allergy & Immunology

## 2017-02-12 ENCOUNTER — Encounter: Payer: Self-pay | Admitting: Allergy & Immunology

## 2017-02-12 ENCOUNTER — Encounter (HOSPITAL_COMMUNITY): Payer: Self-pay

## 2017-02-12 ENCOUNTER — Emergency Department (HOSPITAL_COMMUNITY)
Admission: EM | Admit: 2017-02-12 | Discharge: 2017-02-13 | Disposition: A | Discharge status: Home / Self Care | Payer: Medicare HMO | Attending: Emergency Medicine | Admitting: Emergency Medicine

## 2017-02-12 DIAGNOSIS — Z79899 Other long term (current) drug therapy: Secondary | ICD-10-CM | POA: Diagnosis not present

## 2017-02-12 DIAGNOSIS — I10 Essential (primary) hypertension: Secondary | ICD-10-CM | POA: Diagnosis not present

## 2017-02-12 DIAGNOSIS — R21 Rash and other nonspecific skin eruption: Secondary | ICD-10-CM | POA: Diagnosis present

## 2017-02-12 DIAGNOSIS — G8929 Other chronic pain: Secondary | ICD-10-CM | POA: Insufficient documentation

## 2017-02-12 DIAGNOSIS — T7840XA Allergy, unspecified, initial encounter: Secondary | ICD-10-CM

## 2017-02-12 DIAGNOSIS — Z91040 Latex allergy status: Secondary | ICD-10-CM | POA: Diagnosis not present

## 2017-02-12 MED ORDER — FAMOTIDINE IN NACL 20-0.9 MG/50ML-% IV SOLN
20.0000 mg | Freq: Once | INTRAVENOUS | Status: AC
Start: 2017-02-12 — End: 2017-02-13
  Administered 2017-02-12: 20 mg via INTRAVENOUS
  Filled 2017-02-12: qty 50

## 2017-02-12 MED ORDER — METHYLPREDNISOLONE SODIUM SUCC 125 MG IJ SOLR
125.0000 mg | Freq: Once | INTRAMUSCULAR | Status: AC
  Administered 2017-02-12: 125 mg via INTRAVENOUS
  Filled 2017-02-12: qty 2

## 2017-02-12 MED ORDER — EPINEPHRINE 0.3 MG/0.3ML IJ SOAJ
0.3000 mg | Freq: Once | INTRAMUSCULAR | Status: AC
  Administered 2017-02-12: 0.3 mg via INTRAMUSCULAR
  Filled 2017-02-12: qty 0.3

## 2017-02-12 NOTE — Telephone Encounter (Signed)
Patient is calling about a reaction 2 weeks ago Patient still has a rash on the face and is wondering if her creams or meds can be tweaked to help get rid of the rash faster.

## 2017-02-12 NOTE — Telephone Encounter (Signed)
Reviewed note. We can add on Eucrisa one application twice daily as needed for the rash. Please ask her to take pictures. She can also increased her antihistamines (Xyzal or Zyrtec) to see if this will help.   Salvatore Marvel, MD Allergy and [place] [facility] [place]

## 2017-02-12 NOTE — Telephone Encounter (Signed)
Please advise 

## 2017-02-12 NOTE — Telephone Encounter (Signed)
Patient called back again and is wondering if her medications should be switched. Patient went to [facility] to get epinephrine injection and got prednisone. Please advise

## 2017-02-12 NOTE — ED Triage Notes (Signed)
Reports of swelling to tounge since Thursday, worse this evening. Unknown source of allergy per patient. Patient requesting not to get Epi unless last resort. Denies difficulty breathing. NAD noted.
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
## 2017-02-13 MED ORDER — CRISABOROLE 2 % EX OINT: 1.0000 "application " | TOPICAL_OINTMENT | Freq: Two times a day (BID) | CUTANEOUS | 2 refills | Status: DC

## 2017-02-13 MED ORDER — HEPARIN SOD (PORK) LOCK FLUSH 100 UNIT/ML IV SOLN
INTRAVENOUS | Status: AC
  Filled 2017-02-13: qty 5

## 2017-02-13 MED ORDER — PREDNISONE 50 MG PO TABS: ORAL_TABLET | ORAL | 0 refills | Status: DC

## 2017-02-13 NOTE — Telephone Encounter (Signed)
Reviewed chart. I will send a MyChart message, but she needs to make a follow up appointment to discuss further and get to the bottom of this.   Salvatore Marvel, MD [facility]

## 2017-02-13 NOTE — Telephone Encounter (Signed)
Spoke to patient states she had another reaction yesterday and went to ER. Advised as written per Dr Ernst Bowler will send in Eucrisa patient verbalized understanding. Patient would like to know what is the next step since she has had 2 reactions within the last 2 weeks. Dr Babs Sciara advise patient sent you a mychart message as well

## 2017-02-13 NOTE — Telephone Encounter (Signed)
Can we call Ms. Yates to schedule an appointment to discuss Xolair injections? Also please send some information on Xolair and chronic urticaria.   Salvatore Marvel, MD [facility] [place]

## 2017-02-13 NOTE — Telephone Encounter (Signed)
FYI: Patient is already on Xolair injections. Patient has had 1 Xolair injection and due for another one on Tuesday 02/19/2017.I called patient to get more information in regards to her allergic reactions. Patient had an allergic reaction 2 weeks ago. Patient stated that she believes it was grass, patient was swinging a child and the patients legs started itching. By time she got to the front porch she was broke out from head to toe. Patient then went to [facility], patients tongue started swelling where she could not swallow. Patient gave herself an epi in the car. The most recent reaction started this pass Sunday 02/10/2017 with a rash on the face and neck. Patient started taking 4 benadryl's daily for the rash, then yesterday her tongue started swelling. Patient made it to the hospital where she was giving an epi. Patient made an appointment to come in tomorrow at 1:30.

## 2017-02-14 ENCOUNTER — Telehealth: Payer: Self-pay

## 2017-02-14 ENCOUNTER — Encounter: Payer: Self-pay | Admitting: Allergy & Immunology

## 2017-02-14 ENCOUNTER — Ambulatory Visit (INDEPENDENT_AMBULATORY_CARE_PROVIDER_SITE_OTHER): Payer: Medicare HMO | Admitting: Allergy & Immunology

## 2017-02-14 VITALS — BP 136/90 | HR 74 | Temp 97.6°F | Resp 21

## 2017-02-14 DIAGNOSIS — J302 Other seasonal allergic rhinitis: Secondary | ICD-10-CM

## 2017-02-14 DIAGNOSIS — J3089 Other allergic rhinitis: Secondary | ICD-10-CM

## 2017-02-14 DIAGNOSIS — L501 Idiopathic urticaria: Secondary | ICD-10-CM

## 2017-02-14 DIAGNOSIS — B370 Candidal stomatitis: Secondary | ICD-10-CM

## 2017-02-14 DIAGNOSIS — L239 Allergic contact dermatitis, unspecified cause: Secondary | ICD-10-CM | POA: Diagnosis not present

## 2017-02-14 MED ORDER — FLUCONAZOLE 200 MG PO TABS: 200.0000 mg | ORAL_TABLET | Freq: Every day | ORAL | 0 refills | Status: DC

## 2017-02-14 MED ORDER — LEVOCETIRIZINE DIHYDROCHLORIDE 5 MG PO TABS: 10.0000 mg | ORAL_TABLET | Freq: Every day | ORAL | 3 refills | Status: DC

## 2017-02-14 MED ORDER — CETIRIZINE HCL 10 MG PO TABS: 20.0000 mg | ORAL_TABLET | Freq: Every day | ORAL | 3 refills | Status: DC

## 2017-02-14 MED ORDER — NYSTATIN 100000 UNIT/ML MT SUSP: OROMUCOSAL | 1 refills | Status: DC

## 2017-02-14 NOTE — Progress Notes (Signed)
FOLLOW UP  Date of Service/Encounter:  02/14/17   Assessment:   Chronic idiopathic urticaria  Seasonal and perennial allergic rhinitis  Oral thrush  Allergic contact dermatitis  Complex medical history, including multiple GI surgeries and bipolar depression  Plan/Recommendations:   1. Chronic idiopathic urticaria - with normal lab workup on Xolair (initiated 01/22/17) - Add Eucrisa one application twice daily as needed for the rash on the cheeks. - In the meantime, start suppressive dosing of antihistamines:   - Morning: Xyzal (levocetirizine) 10mg  (two tablets)   - Evening: Zyrtec (cetirizine) 20mg  (two tablets) + Zantac (ranitidine) 300mg  - If you have breakthrough rashes/hives, take cetirizine 40mg  (four tablets) to suppress the reaction.  - Today, all of your vitals were normal and the back of your throat looked normal, therefore I am not convinced that this is full blown anaphylaxis. - The tongue swelling sensation might be related to your current thrush (Candidal yeast infection).   - I have a low suspicion for a food allergy, especially given the negative food panel (66 foods) performed at her first visit in June 2018.  - It should be noted that she did have a very mild positive IgE to alpha gal when I first saw her (IgE of 0.40), but at the time I did not feel that this was clinically relevant. - It was fortunate that we were able to see her today during an episode to reassure her that her vitals were normal and her physical exam was fairly normal as well, including an easily visualized posterior oropharynx.   2. Perennial and seasonal allergic rhinitis (cats, dogs, grasses, trees, weeds) -  I am not convinced that your reactions have anything to do with seasonal allergies, therefore I do not think that allergy shots would be helpful.  - Continue to monitor exposures to see what might be triggering these reactions.  - She does not have the classic symptoms of allergic  rhinitis (itchy watery eyes, sneezing, nasal congestion, etc).   3. Contact dermatitis - I would recommend that you come in for ptach testing, next available.  - In the meantime, use Eucrisa one application twice daily as needed for the rash on the face.  - In all honesty, I was not very impressed with the rash on her face today, although the rash on her chest was much more impressive.   4. Oral thrush - We will send in a prescription for Diflucan (fluconazole) 200mg  once in conjunction with nystatin suspension 29mL every 6-8 hours for seven days. - We did put a refill on the nystatin.   5. History of large local reactions to stinging insects - with negative Hymenoptera panel in August 2018 - She will be scheduled in the venom clinic for the last quarter of 2018 or the first quarter of 2019.  - We can also consider fire ant testing, although that is not consistent with the history of her reaction, as she did see a flying insect sting her.  5. Return in about 4 days (around 02/18/2017) for [facility] TESTING.   Subjective:   [name] is a 36 y.o. female presenting today for follow up of  Chief Complaint  Patient presents with  . Allergic Reaction  . Urticaria    PAISLEY GRAJEDA has a history of the following: Patient Active Problem List   Diagnosis Date Noted  . Hyperlipidemia 01/30/2017  . 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 Seasonal and perennial allergic rhinitis 12/25/2016  .  Chronic idiopathic urticaria 12/25/2016  . Interstitial cystitis 05/07/2016  . Chronic fatigue 10/12/2015  . Chronic idiopathic constipation 05/20/2015  . 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 B12 deficiency 07/13/2013  . Drug withdrawal ([place]) 04/30/2013  . Symptoms concerning nutrition, metabolism, and development 04/24/2013  . Nausea with vomiting 04/15/2013  . 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 Polycystic ovaries 03/25/2013  . Fatty liver  02/19/2013  . Venous stasis 02/19/2013  . Peripheral edema 01/06/2013  . Chronic anticoagulation 12/30/2012  . Migraine headache 12/24/2012  . Chronic pain syndrome 09/09/2012  . 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 Esophageal reflux 08/19/2012  . Vitamin D deficiency 07/11/2012  . Insomnia secondary to depression with anxiety 07/11/2012  . Nausea alone 06/11/2012  . 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 Hypokalemia 02/27/2012  . 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 Essential hypertension 01/18/2012  . Hypomagnesemia 01/18/2012  . OCD (obsessive compulsive disorder) 01/03/2012    Class: Chronic  . Depression 01/01/2012  . Anemia 10/01/2011  . Anxiety state 08/24/2011  . Obstructive sleep apnea 08/24/2011  . Abdominal pain 08/16/2011  . Endocrine disorder 05/21/2011  . Dyspepsia and disorder of function of stomach 03/21/2011  . Chest pain, unspecified 12/13/2010  . INTESTINAL MALABSORPTION, POSTSURGICAL 07/24/2010  . DIARRHEA 07/24/2010  . ABDOMINAL PAIN-PERIUMBILICAL 37/62/8315    History obtained from: chart review and patient and patient's boyfriend.  Sammuel Bailiff Primary Care Provider is Mikey Kirschner, MD.     [name] is a 36 y.o. female presenting for a follow up visit. [name] is a 36 year old female with a very complicated past medical history including several gastrointestinal surgeries as well as bipolar depression resenting for a follow-up visit. She was last seen in July 2018. She has a history of chronic urticaria and seasonal and perennial allergic rhinitis with sensitizations to cats, dogs, grasses, roach, trees, and ragweed. When I initially saw her in June 2018, we did do the entire food panel because she was concerned that foods are triggering her symptoms, which at that time were concentrated around bloating and abdominal pain. The entire food panel was completely negative  with adequate controls. We also did a workup for serious causes of chronic urticaria, which was normal. She did start Xolair to control her chronic urticaria, as she continued to have flares despite suppressive doses of antihistamines.  Since the last visit, she has not done so well. She reports that she has had multiple episodes tongue swelling in conjunction with rashes. The first one occurred 5 days after her first dose of Xolair (August 26th). At the time, she developed a rash on her toes and legs in conjunction with itching on her face. She feels that grass exposure might have been a trigger, as she was outside swinging with her boyfriend's daughter. She developed bilateral LE urticaria and itching, which then lead to the rest of the symptoms. She did take antihistamines and her epinephrine at home, reportedly with improvement. She went to [facility] ED and was given a steroid injection as well as acetaminophen. Physical exam at that visit noted a normal physical exam.   The second ED visit occurred late on the evening of September 11th. She developed a rash on her face and neck on Sunday September 9th and took four Benadryl pills. Then her tongue started swelling on Tuesday September 11th. She went to the ED where, according to the patient, the staff were  very concerned and gave her epinephrine immediately. However, the ED note reports that the patient herself requested epinephrine. The physical exam note shows that she had "minimal anterior tongue edema" with "oropharynx clear". So it is not clear how severe her presentation was at that time. However, at this visit, they did access her port and in that process placed on a Band-Aid on her skin. She developed surrounding erythema with pustules as well as a rash that spread to the superior portions of her bilateral breasts. She was given IV steroids and started on a five day course of prednisone, which she remains on it.   She is does not use any topical  cosmetics routinely. She is using Gain detergent that does have dyes. She denies routine chemical exposures. She is interested in patch testing to further elucidate which chemicals might be triggering her reactions. She did start seeing her current boyfriend around three months ago; her husband uses cologne and deodorant, but otherwise no topical cosmetics.   Otherwise, there have been no changes to her past medical history, surgical history, family history, or social history.    Review of Systems: a 14-point review of systems is pertinent for what is mentioned in HPI.  Otherwise, all other systems were negative. Constitutional: negative other than that listed in the HPI Eyes: negative other than that listed in the HPI Ears, nose, mouth, throat, and face: negative other than that listed in the HPI Respiratory: negative other than that listed in the HPI Cardiovascular: negative other than that listed in the HPI Gastrointestinal: negative other than that listed in the HPI Genitourinary: negative other than that listed in the HPI Integument: negative other than that listed in the HPI Hematologic: negative other than that listed in the HPI Musculoskeletal: negative other than that listed in the HPI Neurological: negative other than that listed in the HPI Allergy/Immunologic: negative other than that listed in the HPI    Objective:   Blood pressure (!) 142/90, pulse 74, temperature 97.6 F (36.4 C), temperature source Oral, resp. rate (!) 21, SpO2 97 %. There is no height or weight on file to calculate BMI.   BP was rechecked prior to discharge and it was 136/90.    Physical Exam:  General: Alert, interactive, in no acute distress. Eyes: No conjunctival injection present on the right, No conjunctival injection present on the left, PERRL bilaterally, No discharge on the right, No discharge on the left and No Horner-Trantas dots present Ears: Right TM pearly gray with normal light reflex,  Left TM pearly gray with normal light reflex, Right TM intact without perforation and Left TM intact without perforation.  Nose/Throat: External nose within normal limits and septum midline, turbinates edematous with clear discharge, post-pharynx erythematous without cobblestoning in the posterior oropharynx. Tonsils 2+ without exudates. Thrush present on the tongue.  Neck: Supple without thyromegaly. Lungs: Clear to auscultation without wheezing, rhonchi or rales. No increased work of breathing. CV: Normal S1/S2, no murmurs. Capillary refill <2 seconds.  Skin: Vague somewhat confluent slightly erythematous rash over bilateral cheeks with a couple of isolated papular lesions. On the chest, there was more impressive papular rash around her port site as well a flat erythematous rash extending to the superior portions of the bilateral breasts. Neuro:   Grossly intact. No focal deficits appreciated. Responsive to questions.   Diagnostic studies: none      Salvatore Marvel, MD [facility]

## 2017-02-14 NOTE — Telephone Encounter (Signed)
Patient called and stated she was having an allergic reaction. Patient wanted to come now,instead of going to the ED. The patient wanted Dr. Ernst Bowler to see what was going on. The patient has had to allergic reactions in the last 2 weeks with a swollen tongue. I informed her to go the ED and for her to give [place] an update and we would still see her this afternoon. Patient was still wanting to come here. I went to Dr. Ernst Bowler and explained the situation. Dr. Ernst Bowler spoke with patient.

## 2017-02-14 NOTE — Patient Instructions (Addendum)
1. Chronic idiopathic urticaria - with normal lab workup on Xolair - Add Eucrisa one application twice daily as needed for the rash on the cheeks. - In the meantime, start suppressive dosing of antihistamines:   - Morning: Xyzal (levocetirizine) 10mg  (two tablets)   - Evening: Zyrtec (cetirizine) 20mg  (two tablets) + Zantac (ranitidine) 300mg  - If you have breakthrough rashes/hives, take cetirizine 40mg  (four tablets) to suppress the reaction.  - Today, all of your vitals were normal and the back of your throat looked normal, therefore I am not convinced that this is full blown anaphylaxis. - The tongue swelling sensation might be related to your current thrush (Candidal yeast infection).   2. Perennial and seasonal allergic rhinitis (cats, dogs, grasses, trees, weeds) -  I am not convinced that your reactions have anything to do with seasonal allergies, therefore I do not think that allergy shots would be helpful.  - Continue to monitor exposures to see what might be triggering these reactions.   3. Contact dermatitis - I would recommend that you come in for ptach testing, next available.  - in the meantime, use Eucrisa one application twice daily as needed for the rash on the face.   4. Oral thrush - We will send in a prescription for Diflucan (fluconazole) 200mg  once in conjunction with nystatin suspension 65mL every 6-8 hours for seven days. - We did put a refill on the nystatin.   5. Return in about 4 days (around 02/18/2017) for [facility] TESTING.   Please inform us of any Emergency Department visits, hospitalizations, or changes in symptoms. Call us before going to the ED for breathing or allergy symptoms since we might be able to fit you in for a sick visit. Feel free to contact us anytime with any questions, problems, or concerns.  It was a pleasure to see you again today! Enjoy the upcoming fall season! Good luck with hurricane preparations!   Websites that have reliable patient  information: 1. American Academy of Asthma, Allergy, and Immunology: www.aaaai.org 2. Food Allergy Research and Education (FARE): foodallergy.org 3. Mothers of Asthmatics: http://www.asthmacommunitynetwork.org 4. American College of Allergy, Asthma, and Immunology: www.acaai.org   Election Day is coming up on Tuesday, November 6th! Make your voice heard! Register to vote at vote.org!

## 2017-02-15 ENCOUNTER — Telehealth: Payer: Self-pay | Admitting: Family Medicine

## 2017-02-15 NOTE — Telephone Encounter (Signed)
Pt called stating that she has had 3 anaphylactic episodes in the past couple weeks and was recently seen by her allergist. Pt states that the allergist tweaked some of her medications. Pt stated that last night when she was sleeping that her boyfriend noticed that her breathing was shallow. Pt is wondering if she should get a sleep study done. Please advise.

## 2017-02-15 NOTE — Telephone Encounter (Signed)
Discussed with pt. Pt verbalized understanding. Pt states she will call back if she wants to get appt to discuss further.

## 2017-02-15 NOTE — Telephone Encounter (Signed)
Not based on shallow breathing episode, rec o v with carolyn or myself to disc further if pt would like to consider, if she does not fit certain criteria be prepared for her insur co to not pay for it

## 2017-02-17 ENCOUNTER — Other Ambulatory Visit: Payer: Self-pay | Admitting: Family Medicine

## 2017-02-18 ENCOUNTER — Ambulatory Visit (INDEPENDENT_AMBULATORY_CARE_PROVIDER_SITE_OTHER): Payer: Medicare HMO | Admitting: Allergy

## 2017-02-18 ENCOUNTER — Encounter: Payer: Self-pay | Admitting: Allergy

## 2017-02-18 VITALS — BP 124/74 | HR 83 | Resp 17

## 2017-02-18 DIAGNOSIS — B370 Candidal stomatitis: Secondary | ICD-10-CM

## 2017-02-18 DIAGNOSIS — L239 Allergic contact dermatitis, unspecified cause: Secondary | ICD-10-CM

## 2017-02-18 NOTE — Telephone Encounter (Signed)
Six mo worth 

## 2017-02-18 NOTE — Patient Instructions (Addendum)
Contact dermatitis - TRUE test patches placed on back and taped in place with paper tape.  Advised her to not get the patches. She may use antihistamines in the meantime if she has any itching..  - may continue use Eucrisa one application twice daily as needed for the rash on the face.   Oral thrush - completed Diflucan (fluconazole) 200mg  once  - continue nystatin suspension 75mL every 6-8 hours for seven days.  F/u 2 days

## 2017-02-18 NOTE — Progress Notes (Signed)
Follow-up Note  RE: [name] MRN: [id number] DOB: 04/07/1981 Date of Office Visit: 02/18/2017   History of present illness: [name] is a 36 y.o. female presenting today for patch test placement.  She was last seen in the office on 02/14/2017 by Dr. Dellis Anes for follow-up of chronic idiopathic urticaria, allergic rhinitis, rash, contact dermatitis. She presents today for patch placement. She has not been on any steroids that may interfere with the testing today.  She reports she is in her usual state of health without any recent illnesses,..         Review of systems: ROS  All other systems negative unless noted above in HPI  Past medical/social/surgical/family history have been reviewed and are unchanged unless specifically indicated below.  No changes  Medication List: Allergies as of 02/18/2017      Reactions   Bee Venom Swelling   Latex Anaphylaxis   Macrodantin [nitrofurantoin] Hives, Other (See Comments), Rash   Couldn't swallow on the last day of the course. Couldn't swallow on the last day of the course.   Cephalexin Nausea Only, Nausea And Vomiting   Codeine Nausea Only, Other (See Comments), Nausea And Vomiting   Adhesive [tape]    Skin Comfort [alum Sulfate-ca Acetate]    Bacitracin-polymyxin B Rash   Lactase Nausea Only   Severe GI Upset per pt   Nitrofurantoin Macrocrystal Rash      Medication List       Accurate as of 02/18/17  1:33 PM. Always use your most recent med list.          albuterol 108 (90 Base) MCG/ACT inhaler Commonly known as:  VENTOLIN HFA Inhale 2 puffs into the lungs 4 (four) times daily as needed for wheezing or shortness of breath.   amantadine 100 MG capsule Commonly known as:  SYMMETREL Take 100 mg by mouth 3 (three) times daily.   B-12 1000 MCG Tabs Take 1 tablet by mouth daily.   buPROPion 200 MG 12 hr tablet Commonly known as:  WELLBUTRIN SR Take 200 mg by mouth 2 (two) times daily.   busPIRone 15 MG  tablet Commonly known as:  BUSPAR Take 15 mg by mouth 3 (three) times daily.   cetirizine 10 MG tablet Commonly known as:  ZYRTEC Take 2 tablets (20 mg total) by mouth at bedtime.   CHANTIX 0.5 MG tablet Generic drug:  varenicline Take 1 tablet by mouth 2 (two) times daily.   chlorzoxazone 500 MG tablet Commonly known as:  PARAFON TAKE 1 TABLET (500 MG TOTAL) BY MOUTH 3 (THREE) TIMES DAILY AS NEEDED FOR MUSCLE SPASMS.   clonazePAM 1 MG tablet Commonly known as:  KLONOPIN Take 1 mg by mouth every 6 (six) hours as needed.   Crisaborole 2 % Oint Commonly known as:  EUCRISA Apply 1 application topically 2 (two) times daily.   cycloSPORINE 0.05 % ophthalmic emulsion Commonly known as:  RESTASIS Place 1 drop into both eyes 2 (two) times daily.   ELIQUIS 5 MG Tabs tablet Generic drug:  apixaban Take 5 mg by mouth 2 (two) times daily.   EPINEPHrine 0.3 mg/0.3 mL Soaj injection Commonly known as:  EPI-PEN once.   FISH OIL PO Take 1 capsule by mouth 2 (two) times daily.   fluconazole 200 MG tablet Commonly known as:  DIFLUCAN Take 1 tablet (200 mg total) by mouth daily.   FLUoxetine 20 MG capsule Commonly known as:  PROZAC Take 3 capsules (60 mg total) by  mouth daily.   fluticasone 110 MCG/ACT inhaler Commonly known as:  FLOVENT HFA Inhale 2 puffs into the lungs 2 (two) times daily.   gabapentin 600 MG tablet Commonly known as:  NEURONTIN Take 600-1,800 mg by mouth 3 (three) times daily. Take one in am, one at noon, and three at bedtime   lamoTRIgine 200 MG tablet Commonly known as:  LAMICTAL Take 400 mg by mouth daily.   LATUDA 40 MG Tabs tablet Generic drug:  lurasidone Take 40 mg by mouth daily with breakfast.   levocetirizine 5 MG tablet Commonly known as:  XYZAL Take 2 tablets (10 mg total) by mouth daily.   levothyroxine 25 MCG tablet Commonly known as:  SYNTHROID, LEVOTHROID Take 25 mcg by mouth daily before breakfast.   lithium carbonate 300 MG CR  tablet Commonly known as:  LITHOBID Take 600 mg by mouth at bedtime.   MIRENA IU by Intrauterine route.   montelukast 10 MG tablet Commonly known as:  SINGULAIR Take 1 tablet (10 mg total) by mouth at bedtime.   multivitamin with minerals Tabs tablet Take 1 tablet by mouth daily.   naltrexone 50 MG tablet Commonly known as:  DEPADE Take 50 mg by mouth daily.   nystatin 100000 UNIT/ML suspension Commonly known as:  MYCOSTATIN Take 5ml every 6-8 hours for 7 days.   OLANZapine 10 MG tablet Commonly known as:  ZYPREXA Take 15 mg by mouth at bedtime.   pentosan polysulfate 100 MG capsule Commonly known as:  ELMIRON Take 100 mg by mouth daily as needed.   pravastatin 10 MG tablet Commonly known as:  PRAVACHOL Take 1 tablet (10 mg total) by mouth daily.   predniSONE 50 MG tablet Commonly known as:  DELTASONE One tablet po daily for 4 days   PROBIOTIC-10 PO Take 1 capsule by mouth daily.   promethazine 25 MG tablet Commonly known as:  PHENERGAN Take 1 tablet (25 mg total) by mouth every 6 (six) hours as needed for nausea or vomiting.   ranitidine 300 MG tablet Commonly known as:  ZANTAC TAKE 1 TABLET (300 MG TOTAL) BY MOUTH AT BEDTIME.   temazepam 30 MG capsule Commonly known as:  RESTORIL TAKE 1 CAPSULE BY MOUTH AT BEDTIME AS NEEDED FOR SLEEP   traMADol 50 MG tablet Commonly known as:  ULTRAM Take 50 mg by mouth every 6 (six) hours as needed for moderate pain or severe pain.   triamcinolone 0.025 % ointment Commonly known as:  KENALOG APPLY TO AFFECTED AREA TWICE A DAY   triazolam 0.125 MG tablet Commonly known as:  HALCION Take 0.25 mg by mouth at bedtime as needed for sleep.   VITAMIN D PO Take 1 capsule by mouth daily.   XOLAIR 150 MG injection Generic drug:  omalizumab Inject 300 mg into the skin every 30 (thirty) days.       Known medication allergies: Allergies  Allergen Reactions  . Bee Venom Swelling  . Latex Anaphylaxis  . Macrodantin  [Nitrofurantoin] Hives, Other (See Comments) and Rash    Couldn't swallow on the last day of the course. Couldn't swallow on the last day of the course.  . Cephalexin Nausea Only and Nausea And Vomiting  . Codeine Nausea Only, Other (See Comments) and Nausea And Vomiting  . Adhesive [Tape]   . Skin Comfort [Alum Sulfate-Ca Acetate]   . Bacitracin-Polymyxin B Rash  . Lactase Nausea Only    Severe GI Upset per pt  . Nitrofurantoin Macrocrystal Rash  Physical examination: Blood pressure 124/74, pulse 83, resp. rate 17, SpO2 95 %.  Complete PE not performed today.   Alert and oriented, in NAD Tongue with white patches along edge No visible rash  Diagnositics/Labs:  True test patches placed on back and taped in place.   Assessment and plan:   Contact dermatitis - TRUE test patches placed on back and taped in place with paper tape.  Advised her to not get the patches. She may use antihistamines in the meantime if she has any itching..  - may continue use Eucrisa one application twice daily as needed for the rash on the face.   Oral thrush - completed Diflucan (fluconazole) 200mg  once  - continue nystatin suspension 5mL every 6-8 hours for seven days.  F/u 2 days  I appreciate the opportunity to take part in [name]'s care. Please do not hesitate to contact me with questions.  Sincerely,   Margo Aye, MD Allergy/Immunology [facility]

## 2017-02-19 ENCOUNTER — Ambulatory Visit (INDEPENDENT_AMBULATORY_CARE_PROVIDER_SITE_OTHER): Payer: Medicare HMO | Admitting: *Deleted

## 2017-02-19 DIAGNOSIS — F4310 Post-traumatic stress disorder, unspecified: Secondary | ICD-10-CM | POA: Diagnosis not present

## 2017-02-19 DIAGNOSIS — F322 Major depressive disorder, single episode, severe without psychotic features: Secondary | ICD-10-CM | POA: Diagnosis not present

## 2017-02-19 DIAGNOSIS — L501 Idiopathic urticaria: Secondary | ICD-10-CM | POA: Diagnosis not present

## 2017-02-19 DIAGNOSIS — R69 Illness, unspecified: Secondary | ICD-10-CM | POA: Diagnosis not present

## 2017-02-20 ENCOUNTER — Ambulatory Visit: Payer: Medicare HMO | Admitting: Family Medicine

## 2017-02-20 DIAGNOSIS — L501 Idiopathic urticaria: Secondary | ICD-10-CM

## 2017-02-20 NOTE — Patient Instructions (Addendum)
Chronic Ideopathic Urticaria - First patch reading today. Redness/or swelling noted at # 28 and #35 - Return for final reading on Friday of this week ( 02/22/17) - You may let the water run over your skin in a shower with no scrubbing or soap products

## 2017-02-20 NOTE — Progress Notes (Signed)
FOLLOW UP  Date of Service/Encounter:  02/20/17   Assessment:   Chronic idiopathic urticaria   Plan/Recommendations:    Patient Instructions  Chronic Ideopathic Urticaria - First patch reading today. Redness/or swelling noted at # 28 and #35 - Return for final reading on Friday of this week ( 02/22/17) - You may let the water run over your skin in a shower with no scrubbing or soap products    Subjective:   [name] is a 36 y.o. female presenting today for the 48 hour patch test interpretation, given suspected history of contact dermatitis.    Diagnostics:   TRUE TEST 48-hour hour reading: doubtful, faint erythema with no inflammation reaction to #28 (Gold sodium thiosulfate) and doubtful, with no erythema and slight inflammation reaction to #35 (Disperse blue)  Plan:   Allergic contact dermatitis - The patient has been provided detailed information regarding the substances she is sensitive to, as well as products containing the substances.   - Meticulous avoidance of these substances is recommended.  - If avoidance is not possible, the use of barrier creams or lotions is recommended. - If symptoms persist or progress despite meticulous avoidance of gold sodium thiosulfate and disperse blue, Dermatology Referral may be warranted.   [name] has a history of the following: Patient Active Problem List   Diagnosis Date Noted  . Hyperlipidemia 01/30/2017  . 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 Seasonal and perennial allergic rhinitis 12/25/2016  . Chronic idiopathic urticaria 12/25/2016  . Interstitial cystitis 05/07/2016  . Chronic fatigue 10/12/2015  . Chronic idiopathic constipation 05/20/2015  . 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 B12 deficiency 07/13/2013  . Drug withdrawal ([place]) 04/30/2013  . Symptoms concerning nutrition, metabolism, and development 04/24/2013  . Nausea with vomiting 04/15/2013  .  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 Polycystic ovaries 03/25/2013  . Fatty liver 02/19/2013  . Venous stasis 02/19/2013  . Peripheral edema 01/06/2013  . Chronic anticoagulation 12/30/2012  . Migraine headache 12/24/2012  . Chronic pain syndrome 09/09/2012  . 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 Esophageal reflux 08/19/2012  . Vitamin D deficiency 07/11/2012  . Insomnia secondary to depression with anxiety 07/11/2012  . Nausea alone 06/11/2012  . 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 Hypokalemia 02/27/2012  . 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 Essential hypertension 01/18/2012  . Hypomagnesemia 01/18/2012  . OCD (obsessive compulsive disorder) 01/03/2012    Class: Chronic  . Depression 01/01/2012  . Anemia 10/01/2011  . Anxiety state 08/24/2011  . Obstructive sleep apnea 08/24/2011  . Abdominal pain 08/16/2011  . Endocrine disorder 05/21/2011  . Dyspepsia and disorder of function of stomach 03/21/2011  . Chest pain, unspecified 12/13/2010  . INTESTINAL MALABSORPTION, POSTSURGICAL 07/24/2010  . DIARRHEA 07/24/2010  . ABDOMINAL PAIN-PERIUMBILICAL 12/75/1700    History obtained from: chart review and patient interview.  Sammuel Bailiff Primary Care Provider is [name]. [name],  MD.     [name] is a 36 y.o. female presenting for a follow up visit for 48 hour patch reading.   Since the last visit, she reports an itching sensation on the upper right side of her back. She denies additional reports of cardiopulmonary or gastrointestinal issues.  Otherwise, there have been no changes to her past medical history, surgical history, family history, or social history.  Review of Systems: a 14-point review of systems is pertinent for what is mentioned  in HPI.  Otherwise, all other systems were negative. Constitutional: negative other than that listed in the HPI Eyes: negative other than  that listed in the HPI Ears, nose, mouth, throat, and face: negative other than that listed in the HPI Respiratory: negative other than that listed in the HPI Cardiovascular: negative other than that listed in the HPI Gastrointestinal: negative other than that listed in the HPI Genitourinary: negative other than that listed in the HPI Integument: negative other than that listed in the HPI Hematologic: negative other than that listed in the HPI Musculoskeletal: negative other than that listed in the HPI Neurological: negative other than that listed in the HPI Allergy/Immunologic: negative other than that listed in the HPI    Objective:    Physical Exam:  General: Alert, interactive, in no acute distress. Eyes: No conjunctival injection present on the right and No conjunctival injection present on the left Ears: Right TM pearly gray with normal light reflex and Left TM pearly gray with normal light reflex.  Nose/Throat: External nose within normal limits and septum midline, turbinates minimally edematous with clear discharge, post-pharynx mildly erythematous without cobblestoning in the posterior oropharynx. Tonsils unremarklable without exudates Neck: Supple without thyromegaly. Lungs: Clear to auscultation without wheezing, rhonchi or rales. No increased work of breathing. CV: Normal S1/S2, no murmurs. Capillary refill <2 seconds.  Skin: Skin warm and dry without lesions other than patch testing area.. Neuro:   Grossly intact. No focal deficits appreciated. Responsive to questions.    Dara Hoyer FNP-C [facility]

## 2017-02-21 ENCOUNTER — Encounter: Payer: Self-pay | Admitting: Obstetrics & Gynecology

## 2017-02-21 ENCOUNTER — Ambulatory Visit (INDEPENDENT_AMBULATORY_CARE_PROVIDER_SITE_OTHER): Payer: Medicare HMO | Admitting: Obstetrics & Gynecology

## 2017-02-21 VITALS — BP 118/72 | HR 78 | Ht 64.0 in | Wt 202.0 lb

## 2017-02-21 DIAGNOSIS — Z30432 Encounter for removal of intrauterine contraceptive device: Secondary | ICD-10-CM

## 2017-02-21 DIAGNOSIS — Z01419 Encounter for gynecological examination (general) (routine) without abnormal findings: Secondary | ICD-10-CM
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
## 2017-02-22 ENCOUNTER — Ambulatory Visit: Payer: Medicare HMO | Admitting: Allergy

## 2017-02-22 ENCOUNTER — Ambulatory Visit: Payer: Medicare HMO | Admitting: Nurse Practitioner

## 2017-02-22 DIAGNOSIS — L239 Allergic contact dermatitis, unspecified cause: Secondary | ICD-10-CM

## 2017-02-22 NOTE — Progress Notes (Signed)
Follow-up Note  RE: JAYLISE GUILLET MRN: [id number] DOB: [birth year] Date of Office Visit: 02/22/2017  Primary care provider: Merlyn Albert, MD Referring provider: Merlyn Albert, MD   [place] returns to the office today for the final patch test interpretation, given suspected history of contact dermatitis.    Diagnostics:  TRUE TEST 96 hour reading: back is clear.   48hr reading: doubtful, faint erythema with no inflammation reaction to #28 (Gold sodium thiosulfate) and doubtful, with no erythema and slight inflammation reaction to #35 (Disperse blue)  Plan:  Allergic contact dermatitis  The patient was provided detailed information regarding the substances she is sensitive to, as well as products containing the substances on initial patch read.  Meticulous avoidance of these substances is recommended. If avoidance is not possible, the use of barrier creams or lotions is recommended.  Margo Aye, MD [facility]

## 2017-03-05 DIAGNOSIS — Z7901 Long term (current) use of anticoagulants: Secondary | ICD-10-CM | POA: Diagnosis not present

## 2017-03-05 DIAGNOSIS — D509 Iron deficiency anemia, unspecified: Secondary | ICD-10-CM | POA: Diagnosis not present

## 2017-03-05 DIAGNOSIS — Z95828 Presence of other vascular implants and grafts: Secondary | ICD-10-CM | POA: Diagnosis not present

## 2017-03-05 DIAGNOSIS — I824Y3 Acute embolism and thrombosis of unspecified deep veins of proximal lower extremity, bilateral: Secondary | ICD-10-CM | POA: Diagnosis not present

## 2017-03-05 DIAGNOSIS — B370 Candidal stomatitis: Secondary | ICD-10-CM | POA: Diagnosis not present

## 2017-03-05 DIAGNOSIS — E756 Lipid storage disorder, unspecified: Secondary | ICD-10-CM | POA: Diagnosis not present

## 2017-03-07 ENCOUNTER — Encounter: Payer: Self-pay | Admitting: Family Medicine

## 2017-03-07 ENCOUNTER — Ambulatory Visit (INDEPENDENT_AMBULATORY_CARE_PROVIDER_SITE_OTHER): Payer: Medicare HMO | Admitting: Family Medicine

## 2017-03-07 VITALS — BP 108/76 | Ht 64.0 in | Wt 196.6 lb

## 2017-03-07 DIAGNOSIS — I951 Orthostatic hypotension: Secondary | ICD-10-CM | POA: Diagnosis not present

## 2017-03-07 DIAGNOSIS — R251 Tremor, unspecified: Secondary | ICD-10-CM

## 2017-03-07 DIAGNOSIS — R1310 Dysphagia, unspecified: Secondary | ICD-10-CM

## 2017-03-07 DIAGNOSIS — Z79899 Other long term (current) drug therapy: Secondary | ICD-10-CM

## 2017-03-07 NOTE — Progress Notes (Signed)
Subjective:    Patient ID: [name], female    DOB: 30-Jan-1981, 36 y.o.   MRN: [id number]  HPI Patient arrives with c/o tremors in his hand and dizziness with trouble swallowing. Patient had very extensive gastric surgery ar [facility] the end of May. 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  HENT: Negative for congestion.   Respiratory: Negative for cough, chest tightness and shortness of breath.   Cardiovascular: Negative for chest pain and leg swelling.  Gastrointestinal: Negative for abdominal pain.  Skin: Negative for color change.  Neurological: Negative for headaches.  Psychiatric/Behavioral: Negative for behavioral problems.       Objective:   Physical Exam Neck no masses finger to nose her hands waver some but she is able to do this Romberg negative She walks without falling lungs are clear no crackles Heart regular Extremities no edema skin warm dry  25 minutes was spent with the patient. Greater than half the time was spent in discussion and answering questions and counseling regarding the issues that the patient came in for today.      Assessment & Plan:  Dysphagia-we will speak with her bariatric surgeon or assistant in order to make sure that they get her set up with gastroenterology for EGD  Fine tremors could be associated with all the psychiatric  medicine she is on she is stopping clonidine if her tremors do not get better over the next 7 days she will notify us and we will help set her up with neurology  Orthostatic hypotension related to the clonidine and I also believe that's causing her dry mouth and making her feel woozy therefore stop clonidine she should discuss with her psychiatrist  Lithium level recommended as along with metabolic 7 because of the dosing she's on she had 2 bottles of lithium with her we eliminated 1  She also had 2 different sleeping pills with her we eliminated 1  Patient was counseled to stick with soft diet

## 2017-03-08 LAB — BASIC METABOLIC PANEL
BUN / CREAT RATIO: 6 — AB (ref 9–23)
BUN: 5 mg/dL — ABNORMAL LOW (ref 6–20)
CHLORIDE: 104 mmol/L (ref 96–106)
CO2: 25 mmol/L (ref 20–29)
Calcium: 9.8 mg/dL (ref 8.7–10.2)
Creatinine, Ser: 0.82 mg/dL (ref 0.57–1.00)
GFR calc Af Amer: 107 mL/min/{1.73_m2} (ref >59)
GFR calc non Af Amer: 93 mL/min/{1.73_m2} (ref >59)
Glucose: 76 mg/dL (ref 65–99)
POTASSIUM: 4.4 mmol/L (ref 3.5–5.2)
SODIUM: 140 mmol/L (ref 134–144)

## 2017-03-08 LAB — LITHIUM LEVEL: Lithium Lvl: 0.8 mmol/L (ref 0.6–1.2)

## 2017-03-11 ENCOUNTER — Encounter (HOSPITAL_COMMUNITY)
Admission: RE | Admit: 2017-03-11 | Discharge: 2017-03-11 | Disposition: A | Discharge status: Home / Self Care | Payer: Medicare HMO | Source: Ambulatory Visit | Attending: Hematology and Oncology | Admitting: Hematology and Oncology

## 2017-03-11 DIAGNOSIS — D509 Iron deficiency anemia, unspecified: Secondary | ICD-10-CM | POA: Insufficient documentation

## 2017-03-11 DIAGNOSIS — Z452 Encounter for adjustment and management of vascular access device: Secondary | ICD-10-CM | POA: Insufficient documentation

## 2017-03-11 MED ORDER — HEPARIN SOD (PORK) LOCK FLUSH 100 UNIT/ML IV SOLN
INTRAVENOUS | Status: AC
  Filled 2017-03-11: qty 5

## 2017-03-11 MED ORDER — SODIUM CHLORIDE 0.9% FLUSH
10.0000 mL | Freq: Once | INTRAVENOUS | Status: AC
  Administered 2017-03-11: 10 mL

## 2017-03-11 MED ORDER — HEPARIN SOD (PORK) LOCK FLUSH 100 UNIT/ML IV SOLN
500.0000 [IU] | Freq: Once | INTRAVENOUS | Status: AC
  Administered 2017-03-11: 500 [IU]

## 2017-03-11 NOTE — Progress Notes (Signed)
I called and left a vm on Dr. Gaylan Gerold nurse line, asked that they r/c to Dr. Lars Mage cell number. I left a vm at the # [phone]

## 2017-03-12 ENCOUNTER — Telehealth: Payer: Self-pay | Admitting: Family Medicine

## 2017-03-12 DIAGNOSIS — R251 Tremor, unspecified: Secondary | ICD-10-CM

## 2017-03-12 NOTE — Telephone Encounter (Signed)
Referral ordered in EPIC- patient ok with [place]. Patient notified.

## 2017-03-12 NOTE — Telephone Encounter (Signed)
So the next step for the symptoms would be further evaluation with a neurologist. We can set her up to see a neurologist [facility] or neurologist at [facility]. Please have the patient let us know which direction she would like to go.

## 2017-03-12 NOTE — Telephone Encounter (Signed)
(  MESSAGE for Dr. Nicki Reaper) patient was seen on 11/1 for hand trimmers,dizziness,light-headed. She states was taken off blood pressure medication for a while to see if symptoms stopped but still occurring according to patient.

## 2017-03-13 ENCOUNTER — Encounter: Payer: Self-pay | Admitting: Neurology

## 2017-03-13 ENCOUNTER — Encounter: Payer: Self-pay | Admitting: Family Medicine

## 2017-03-14 ENCOUNTER — Telehealth: Payer: Self-pay | Admitting: *Deleted

## 2017-03-14 NOTE — Progress Notes (Signed)
I called and left a vm asked that a Surgeon,Nurse or PA  R/cto our office to speak with Dr. Nicki Reaper.

## 2017-03-14 NOTE — Telephone Encounter (Signed)
Called patient and left message regarding next shipment of Xolair.  Paitent needs to give okay before shipment will be released.

## 2017-03-15 ENCOUNTER — Telehealth: Payer: Self-pay | Admitting: Family Medicine

## 2017-03-15 NOTE — Telephone Encounter (Signed)
Pt states they already contacted her this am and will discuss having EGD and their course of action next week.

## 2017-03-15 NOTE — Telephone Encounter (Signed)
I did speak with the PA who works with her Ambulance person at [facility]. I relayed to them that the patient is having some mild dysphagia symptoms. Given her complex history I felt it would be best for [facility] to manage this issue. Their department stated that they will connect with the patient discussed the symptoms then decide upon a proper course of action to help handle her dysphagia symptoms. Nurse's-please let the patient know that we did speak with her specialists PA and they will be connecting with her somewhere in the next few days to discuss her symptoms further regarding dysphagia and what to do about it thank you

## 2017-03-19 ENCOUNTER — Ambulatory Visit: Payer: Medicare HMO

## 2017-03-19 ENCOUNTER — Ambulatory Visit: Payer: Self-pay | Admitting: Allergy & Immunology

## 2017-03-20 ENCOUNTER — Encounter (HOSPITAL_COMMUNITY)
Admission: RE | Admit: 2017-03-20 | Discharge: 2017-03-20 | Disposition: A | Discharge status: Home / Self Care | Payer: Medicare HMO | Source: Ambulatory Visit | Attending: Family Medicine | Admitting: Family Medicine

## 2017-03-20 DIAGNOSIS — Z452 Encounter for adjustment and management of vascular access device: Secondary | ICD-10-CM | POA: Diagnosis not present

## 2017-03-20 DIAGNOSIS — D509 Iron deficiency anemia, unspecified: Secondary | ICD-10-CM | POA: Diagnosis not present

## 2017-03-20 MED ORDER — SODIUM CHLORIDE 0.9 % IV SOLN
510.0000 mg | Freq: Once | INTRAVENOUS | Status: AC
  Administered 2017-03-20: 510 mg via INTRAVENOUS
  Filled 2017-03-20: qty 17

## 2017-03-20 MED ORDER — DIPHENHYDRAMINE HCL 25 MG PO CAPS
ORAL_CAPSULE | ORAL | Status: AC
  Filled 2017-03-20: qty 1

## 2017-03-20 MED ORDER — DEXAMETHASONE 4 MG PO TABS
4.0000 mg | ORAL_TABLET | Freq: Once | ORAL | Status: AC
  Administered 2017-03-20: 4 mg via ORAL
  Filled 2017-03-20: qty 1

## 2017-03-20 MED ORDER — SODIUM CHLORIDE 0.9 % IV SOLN
INTRAVENOUS | Status: DC
  Administered 2017-03-20: 12:00:00 via INTRAVENOUS

## 2017-03-20 MED ORDER — HEPARIN SOD (PORK) LOCK FLUSH 100 UNIT/ML IV SOLN
500.0000 [IU] | INTRAVENOUS | Status: AC | PRN
  Administered 2017-03-20: 500 [IU]

## 2017-03-20 MED ORDER — HEPARIN SOD (PORK) LOCK FLUSH 100 UNIT/ML IV SOLN
INTRAVENOUS | Status: AC
  Filled 2017-03-20: qty 5

## 2017-03-20 MED ORDER — DIPHENHYDRAMINE HCL 25 MG PO CAPS
25.0000 mg | ORAL_CAPSULE | Freq: Once | ORAL | Status: AC
  Administered 2017-03-20: 25 mg via ORAL

## 2017-03-20 NOTE — Telephone Encounter (Signed)
Spoke with patient on 03/19/17 and patient was informed that approval was obtained from her new insurance Scientist, clinical (histocompatibility and immunogenetics)) by Lynelle Smoke and patient should hear from specialty pharmacy this week regarding shipment of Fults. Patient voiced understanding.

## 2017-03-22 DIAGNOSIS — Z713 Dietary counseling and surveillance: Secondary | ICD-10-CM | POA: Diagnosis not present

## 2017-03-22 DIAGNOSIS — R1310 Dysphagia, unspecified: Secondary | ICD-10-CM | POA: Diagnosis not present

## 2017-03-22 DIAGNOSIS — Z9884 Bariatric surgery status: Secondary | ICD-10-CM | POA: Diagnosis not present

## 2017-03-22 DIAGNOSIS — Z6833 Body mass index (BMI) 33.0-33.9, adult: Secondary | ICD-10-CM | POA: Diagnosis not present

## 2017-03-22 DIAGNOSIS — Z48815 Encounter for surgical aftercare following surgery on the digestive system: Secondary | ICD-10-CM | POA: Diagnosis not present

## 2017-03-22 DIAGNOSIS — E663 Overweight: Secondary | ICD-10-CM | POA: Diagnosis not present

## 2017-03-23 ENCOUNTER — Other Ambulatory Visit: Payer: Self-pay | Admitting: Nurse Practitioner

## 2017-03-25 ENCOUNTER — Ambulatory Visit: Payer: Medicare HMO | Admitting: Women's Health

## 2017-03-25 DIAGNOSIS — K219 Gastro-esophageal reflux disease without esophagitis: Secondary | ICD-10-CM | POA: Diagnosis not present

## 2017-03-25 DIAGNOSIS — R1310 Dysphagia, unspecified: Secondary | ICD-10-CM | POA: Diagnosis not present

## 2017-03-25 DIAGNOSIS — Z79899 Other long term (current) drug therapy: Secondary | ICD-10-CM | POA: Diagnosis not present

## 2017-03-25 DIAGNOSIS — Z86718 Personal history of other venous thrombosis and embolism: Secondary | ICD-10-CM | POA: Diagnosis not present

## 2017-03-25 DIAGNOSIS — R69 Illness, unspecified: Secondary | ICD-10-CM | POA: Diagnosis not present

## 2017-03-25 DIAGNOSIS — Z6832 Body mass index (BMI) 32.0-32.9, adult: Secondary | ICD-10-CM | POA: Diagnosis not present

## 2017-03-25 DIAGNOSIS — G4730 Sleep apnea, unspecified: Secondary | ICD-10-CM | POA: Diagnosis not present

## 2017-03-25 DIAGNOSIS — Z9884 Bariatric surgery status: Secondary | ICD-10-CM | POA: Diagnosis not present

## 2017-03-25 NOTE — Telephone Encounter (Signed)
Last seen 03/07/17

## 2017-03-28 NOTE — Progress Notes (Signed)
Dr. Nicki Reaper spoke with Dr at [facility]. See note.

## 2017-03-31 DIAGNOSIS — Z86718 Personal history of other venous thrombosis and embolism: Secondary | ICD-10-CM | POA: Diagnosis not present

## 2017-03-31 DIAGNOSIS — Z9884 Bariatric surgery status: Secondary | ICD-10-CM | POA: Diagnosis not present

## 2017-03-31 DIAGNOSIS — R251 Tremor, unspecified: Secondary | ICD-10-CM | POA: Diagnosis not present

## 2017-03-31 DIAGNOSIS — M25512 Pain in left shoulder: Secondary | ICD-10-CM | POA: Diagnosis not present

## 2017-03-31 DIAGNOSIS — R197 Diarrhea, unspecified: Secondary | ICD-10-CM | POA: Diagnosis not present

## 2017-03-31 DIAGNOSIS — R112 Nausea with vomiting, unspecified: Secondary | ICD-10-CM | POA: Diagnosis not present

## 2017-03-31 DIAGNOSIS — R69 Illness, unspecified: Secondary | ICD-10-CM | POA: Diagnosis not present

## 2017-03-31 DIAGNOSIS — R9431 Abnormal electrocardiogram [ECG] [EKG]: Secondary | ICD-10-CM | POA: Diagnosis not present

## 2017-04-03 DIAGNOSIS — Z452 Encounter for adjustment and management of vascular access device: Secondary | ICD-10-CM | POA: Diagnosis not present

## 2017-04-03 DIAGNOSIS — R61 Generalized hyperhidrosis: Secondary | ICD-10-CM | POA: Diagnosis not present

## 2017-04-03 DIAGNOSIS — R251 Tremor, unspecified: Secondary | ICD-10-CM | POA: Diagnosis not present

## 2017-04-03 DIAGNOSIS — R7309 Other abnormal glucose: Secondary | ICD-10-CM | POA: Diagnosis not present

## 2017-04-03 DIAGNOSIS — Z9884 Bariatric surgery status: Secondary | ICD-10-CM | POA: Diagnosis not present

## 2017-04-03 DIAGNOSIS — K912 Postsurgical malabsorption, not elsewhere classified: Secondary | ICD-10-CM | POA: Diagnosis not present

## 2017-04-03 DIAGNOSIS — R69 Illness, unspecified: Secondary | ICD-10-CM | POA: Diagnosis not present

## 2017-04-04 ENCOUNTER — Ambulatory Visit: Payer: Medicare HMO | Admitting: Family Medicine

## 2017-04-04 DIAGNOSIS — R69 Illness, unspecified: Secondary | ICD-10-CM | POA: Diagnosis not present
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
## 2017-04-08 ENCOUNTER — Ambulatory Visit: Payer: Self-pay | Admitting: Neurology

## 2017-04-09 ENCOUNTER — Encounter (HOSPITAL_COMMUNITY): Payer: Medicare HMO

## 2017-04-09 ENCOUNTER — Encounter (HOSPITAL_COMMUNITY): Payer: Self-pay

## 2017-04-16 ENCOUNTER — Emergency Department (HOSPITAL_COMMUNITY)
Admission: EM | Admit: 2017-04-16 | Discharge: 2017-04-16 | Discharge status: Against Medical Advice | Payer: Medicare HMO

## 2017-04-16 ENCOUNTER — Encounter: Payer: Self-pay | Admitting: Family Medicine

## 2017-04-16 DIAGNOSIS — E860 Dehydration: Secondary | ICD-10-CM | POA: Diagnosis not present

## 2017-04-16 DIAGNOSIS — R112 Nausea with vomiting, unspecified: Secondary | ICD-10-CM | POA: Diagnosis not present

## 2017-04-16 DIAGNOSIS — I951 Orthostatic hypotension: Secondary | ICD-10-CM | POA: Diagnosis not present

## 2017-04-16 DIAGNOSIS — R197 Diarrhea, unspecified: Secondary | ICD-10-CM | POA: Diagnosis not present

## 2017-04-16 NOTE — ED Notes (Signed)
Pt called for third time from waiting room. Pt not answering.

## 2017-04-16 NOTE — ED Notes (Signed)
Called for triage once.

## 2017-04-16 NOTE — ED Notes (Signed)
Pt called for the second time to be triaged. No answer

## 2017-04-23 DIAGNOSIS — K760 Fatty (change of) liver, not elsewhere classified: Secondary | ICD-10-CM | POA: Diagnosis not present

## 2017-04-23 DIAGNOSIS — Z7901 Long term (current) use of anticoagulants: Secondary | ICD-10-CM | POA: Diagnosis not present

## 2017-04-23 DIAGNOSIS — D649 Anemia, unspecified: Secondary | ICD-10-CM | POA: Diagnosis not present

## 2017-04-23 DIAGNOSIS — K219 Gastro-esophageal reflux disease without esophagitis: Secondary | ICD-10-CM | POA: Diagnosis not present

## 2017-04-23 DIAGNOSIS — Z9049 Acquired absence of other specified parts of digestive tract: Secondary | ICD-10-CM | POA: Diagnosis not present

## 2017-04-23 DIAGNOSIS — E875 Hyperkalemia: Secondary | ICD-10-CM | POA: Diagnosis not present

## 2017-04-23 DIAGNOSIS — R918 Other nonspecific abnormal finding of lung field: Secondary | ICD-10-CM | POA: Diagnosis not present

## 2017-04-23 DIAGNOSIS — E162 Hypoglycemia, unspecified: Secondary | ICD-10-CM | POA: Diagnosis not present

## 2017-04-23 DIAGNOSIS — Z79899 Other long term (current) drug therapy: Secondary | ICD-10-CM | POA: Diagnosis not present

## 2017-04-23 DIAGNOSIS — K228 Other specified diseases of esophagus: Secondary | ICD-10-CM | POA: Diagnosis not present

## 2017-04-23 DIAGNOSIS — R1013 Epigastric pain: Secondary | ICD-10-CM | POA: Diagnosis not present

## 2017-04-23 DIAGNOSIS — Z9884 Bariatric surgery status: Secondary | ICD-10-CM | POA: Diagnosis not present

## 2017-04-24 ENCOUNTER — Telehealth: Payer: Self-pay | Admitting: Family Medicine

## 2017-04-24 DIAGNOSIS — F411 Generalized anxiety disorder: Secondary | ICD-10-CM

## 2017-04-24 DIAGNOSIS — E875 Hyperkalemia: Secondary | ICD-10-CM | POA: Diagnosis not present

## 2017-04-24 DIAGNOSIS — R1013 Epigastric pain: Secondary | ICD-10-CM | POA: Diagnosis not present

## 2017-04-24 DIAGNOSIS — R1110 Vomiting, unspecified: Secondary | ICD-10-CM | POA: Diagnosis not present

## 2017-04-24 DIAGNOSIS — K228 Other specified diseases of esophagus: Secondary | ICD-10-CM | POA: Diagnosis not present

## 2017-04-24 DIAGNOSIS — Z09 Encounter for follow-up examination after completed treatment for conditions other than malignant neoplasm: Secondary | ICD-10-CM | POA: Diagnosis not present

## 2017-04-24 NOTE — Telephone Encounter (Signed)
Patient is requesting a referral to a local psychiatry in [place] has one at [facility] but wanted one closer to home.

## 2017-04-24 NOTE — Telephone Encounter (Signed)
Let's do 

## 2017-04-25 MED ORDER — OXYCODONE HCL 5 MG PO TABS
5.00 | ORAL_TABLET | ORAL | Status: DC
Start: ? — End: 2017-04-25

## 2017-04-25 MED ORDER — ACETAMINOPHEN 325 MG PO TABS
325.00 | ORAL_TABLET | ORAL | Status: DC
Start: ? — End: 2017-04-25

## 2017-04-25 MED ORDER — HYDROMORPHONE HCL 1 MG/ML IJ SOLN
.25 | INTRAMUSCULAR | Status: DC
Start: ? — End: 2017-04-25

## 2017-04-25 MED ORDER — ACETAMINOPHEN 500 MG PO TABS
1000.00 | ORAL_TABLET | ORAL | Status: DC
Start: 2017-04-25 — End: 2017-04-25

## 2017-04-25 MED ORDER — HEPARIN SODIUM (PORCINE) 10000 UNIT/ML IJ SOLN
5000.00 | INTRAMUSCULAR | Status: DC
Start: 2017-04-25 — End: 2017-04-25

## 2017-04-25 MED ORDER — LACTATED RINGERS IV SOLN
INTRAVENOUS | Status: DC
Start: ? — End: 2017-04-25

## 2017-04-25 MED ORDER — DIPHENHYDRAMINE HCL 25 MG PO CAPS
25.00 | ORAL_CAPSULE | ORAL | Status: DC
Start: ? — End: 2017-04-25

## 2017-04-25 MED ORDER — OLANZAPINE 5 MG PO TABS
5.00 | ORAL_TABLET | ORAL | Status: DC
Start: 2017-04-24 — End: 2017-04-25

## 2017-04-25 MED ORDER — TEMAZEPAM 15 MG PO CAPS
30.00 | ORAL_CAPSULE | ORAL | Status: DC
Start: ? — End: 2017-04-25

## 2017-04-25 MED ORDER — ONDANSETRON HCL 4 MG/2ML IJ SOLN
4.00 | INTRAMUSCULAR | Status: DC
Start: ? — End: 2017-04-25

## 2017-04-25 MED ORDER — PROMETHAZINE HCL 25 MG/ML IJ SOLN
6.25 | INTRAMUSCULAR | Status: DC
Start: ? — End: 2017-04-25

## 2017-04-25 MED ORDER — GENERIC EXTERNAL MEDICATION
3.00 | Status: DC
Start: ? — End: 2017-04-25

## 2017-04-25 MED ORDER — LEVOTHYROXINE SODIUM 25 MCG PO TABS
25.00 | ORAL_TABLET | ORAL | Status: DC
Start: 2017-04-25 — End: 2017-04-25

## 2017-04-25 MED ORDER — SODIUM CHLORIDE 0.9 % IV SOLN
INTRAVENOUS | Status: DC
Start: ? — End: 2017-04-25

## 2017-04-25 MED ORDER — GABAPENTIN 300 MG PO CAPS
300.00 | ORAL_CAPSULE | ORAL | Status: DC
Start: 2017-04-24 — End: 2017-04-25

## 2017-04-26 DIAGNOSIS — R69 Illness, unspecified: Secondary | ICD-10-CM | POA: Diagnosis not present

## 2017-04-26 NOTE — Telephone Encounter (Signed)
Referral ordered in EPIC. 

## 2017-05-01 DIAGNOSIS — Z30430 Encounter for insertion of intrauterine contraceptive device: Secondary | ICD-10-CM | POA: Diagnosis not present

## 2017-05-01 DIAGNOSIS — K912 Postsurgical malabsorption, not elsewhere classified: Secondary | ICD-10-CM | POA: Diagnosis not present

## 2017-05-01 DIAGNOSIS — Z3009 Encounter for other general counseling and advice on contraception: Secondary | ICD-10-CM | POA: Diagnosis not present

## 2017-05-01 DIAGNOSIS — L309 Dermatitis, unspecified: Secondary | ICD-10-CM | POA: Diagnosis not present

## 2017-05-01 DIAGNOSIS — Z9884 Bariatric surgery status: Secondary | ICD-10-CM | POA: Diagnosis not present

## 2017-05-01 DIAGNOSIS — Z48815 Encounter for surgical aftercare following surgery on the digestive system: Secondary | ICD-10-CM | POA: Diagnosis not present

## 2017-05-01 DIAGNOSIS — E162 Hypoglycemia, unspecified: Secondary | ICD-10-CM | POA: Diagnosis not present

## 2017-05-01 DIAGNOSIS — R112 Nausea with vomiting, unspecified: Secondary | ICD-10-CM | POA: Diagnosis not present

## 2017-05-02 ENCOUNTER — Encounter (HOSPITAL_COMMUNITY): Payer: Self-pay

## 2017-05-02 ENCOUNTER — Telehealth: Payer: Self-pay | Admitting: *Deleted

## 2017-05-02 NOTE — Telephone Encounter (Signed)
Message  Received: Today  Message Contents  [name]        See attached referrral   Referral  Referral # [id number]  Referral Information   Referral # Creation Date Referral Status Status Update  [id number] 04/26/2017 Canceled 05/02/2017: Status History  Status Reason Referral Type Referral Reasons Referral Class  Patient Refused Service Psychiatric Specialty Services Required Internal  To Specialty To Provider To Location/Place of Service To Department  Psychiatry none none [facility]  To Vendor Referred By By Location/Place of Service By Department  none [name], [name], MD [facility]  Priority Start Date Expiration Date Referral Entered By  Routine 04/26/2017 01/21/2018 [name], RN  Visits Requested Visits Authorized Visits Completed Visits Scheduled  1 1    Procedure Information   Procedure Details  Procedure Modifiers Provider Requested Approved  [phone] - Ambulatory referral to Psychiatry None  1 1  Diagnosis Information   Diagnosis  F41.1 (ICD-10-CM) - Anxiety state  Referral Notes  Number of Notes: 4  Type Date User Summary Attachment  General 05/02/2017 4:21 PM [name], LCAS-A Auto: Referral message -  Note   ----- Message ----- From: [name], LCAS-A Sent: 05/02/2017   4:20 PM To: [name], RN  See attached referrral        Type Date User Summary Attachment  General 05/02/2017 4:20 PM [name], [name], LCAS-A - -  Note   Patient reports that he no longer needs services.  Referral taken out of the referral que.        Type Date User Summary Attachment  General 04/30/2017 4:05 PM [name] - -  Note   Referral sent to their "Q", they contact pt Sent MyChart message, referral sent, they'll contact pt, gave # to check status  Ph# [phone]        Type Date User Summary Attachment  Provider Comments 04/26/2017 8:54 AM [name], RN Provider  Comments -  Note   In [place]-current psych at [facility] but wants one closer        Referral Order   Order  Ambulatory referral to Psychiatry (Order # [id number]) on 04/26/2017  View Encounter

## 2017-05-03 ENCOUNTER — Telehealth: Payer: Self-pay | Admitting: Family Medicine

## 2017-05-03 NOTE — Telephone Encounter (Signed)
FYI - Dr. Richardson Landry ordered a psychiatry referral to [facility] for pt per her request  When [name] in [place] contacted pt to schedule, pt states "no longer needs services"

## 2017-05-08 DIAGNOSIS — R112 Nausea with vomiting, unspecified: Secondary | ICD-10-CM | POA: Diagnosis not present

## 2017-05-08 DIAGNOSIS — K5900 Constipation, unspecified: Secondary | ICD-10-CM | POA: Diagnosis not present

## 2017-05-08 DIAGNOSIS — Z9884 Bariatric surgery status: Secondary | ICD-10-CM | POA: Diagnosis not present

## 2017-05-08 DIAGNOSIS — Z48815 Encounter for surgical aftercare following surgery on the digestive system: Secondary | ICD-10-CM | POA: Diagnosis not present

## 2017-05-08 DIAGNOSIS — R110 Nausea: Secondary | ICD-10-CM | POA: Diagnosis not present

## 2017-05-14 ENCOUNTER — Ambulatory Visit: Payer: Medicare HMO | Admitting: Allergy & Immunology

## 2017-05-14 DIAGNOSIS — Z7901 Long term (current) use of anticoagulants: Secondary | ICD-10-CM | POA: Diagnosis not present

## 2017-05-14 DIAGNOSIS — R197 Diarrhea, unspecified: Secondary | ICD-10-CM | POA: Diagnosis not present

## 2017-05-14 DIAGNOSIS — R5383 Other fatigue: Secondary | ICD-10-CM | POA: Diagnosis not present

## 2017-05-14 DIAGNOSIS — K219 Gastro-esophageal reflux disease without esophagitis: Secondary | ICD-10-CM | POA: Diagnosis not present

## 2017-05-14 DIAGNOSIS — K912 Postsurgical malabsorption, not elsewhere classified: Secondary | ICD-10-CM | POA: Diagnosis not present

## 2017-05-14 DIAGNOSIS — R1084 Generalized abdominal pain: Secondary | ICD-10-CM | POA: Diagnosis not present

## 2017-05-14 DIAGNOSIS — R110 Nausea: Secondary | ICD-10-CM | POA: Diagnosis not present

## 2017-05-14 DIAGNOSIS — R112 Nausea with vomiting, unspecified: Secondary | ICD-10-CM | POA: Diagnosis not present

## 2017-05-14 DIAGNOSIS — Z6830 Body mass index (BMI) 30.0-30.9, adult: Secondary | ICD-10-CM | POA: Diagnosis not present

## 2017-05-14 DIAGNOSIS — E43 Unspecified severe protein-calorie malnutrition: Secondary | ICD-10-CM | POA: Diagnosis not present

## 2017-05-15 DIAGNOSIS — Z9884 Bariatric surgery status: Secondary | ICD-10-CM | POA: Diagnosis not present

## 2017-05-15 DIAGNOSIS — E282 Polycystic ovarian syndrome: Secondary | ICD-10-CM | POA: Diagnosis present

## 2017-05-15 DIAGNOSIS — G939 Disorder of brain, unspecified: Secondary | ICD-10-CM | POA: Diagnosis not present

## 2017-05-15 DIAGNOSIS — E43 Unspecified severe protein-calorie malnutrition: Secondary | ICD-10-CM | POA: Diagnosis present

## 2017-05-15 DIAGNOSIS — K219 Gastro-esophageal reflux disease without esophagitis: Secondary | ICD-10-CM | POA: Diagnosis not present

## 2017-05-15 DIAGNOSIS — Z86718 Personal history of other venous thrombosis and embolism: Secondary | ICD-10-CM | POA: Diagnosis not present

## 2017-05-15 DIAGNOSIS — E440 Moderate protein-calorie malnutrition: Secondary | ICD-10-CM | POA: Diagnosis not present

## 2017-05-15 DIAGNOSIS — R112 Nausea with vomiting, unspecified: Secondary | ICD-10-CM | POA: Diagnosis not present

## 2017-05-15 DIAGNOSIS — Z7901 Long term (current) use of anticoagulants: Secondary | ICD-10-CM | POA: Diagnosis not present

## 2017-05-15 DIAGNOSIS — E869 Volume depletion, unspecified: Secondary | ICD-10-CM | POA: Diagnosis present

## 2017-05-15 DIAGNOSIS — Z6830 Body mass index (BMI) 30.0-30.9, adult: Secondary | ICD-10-CM | POA: Diagnosis not present

## 2017-05-15 DIAGNOSIS — F329 Major depressive disorder, single episode, unspecified: Secondary | ICD-10-CM | POA: Diagnosis present

## 2017-05-15 DIAGNOSIS — F419 Anxiety disorder, unspecified: Secondary | ICD-10-CM | POA: Diagnosis present

## 2017-05-15 DIAGNOSIS — E348 Other specified endocrine disorders: Secondary | ICD-10-CM | POA: Diagnosis not present

## 2017-05-15 DIAGNOSIS — K598 Other specified functional intestinal disorders: Secondary | ICD-10-CM | POA: Diagnosis present

## 2017-05-15 DIAGNOSIS — K912 Postsurgical malabsorption, not elsewhere classified: Secondary | ICD-10-CM | POA: Diagnosis present

## 2017-05-15 DIAGNOSIS — Z903 Acquired absence of stomach [part of]: Secondary | ICD-10-CM | POA: Diagnosis not present

## 2017-05-15 DIAGNOSIS — E876 Hypokalemia: Secondary | ICD-10-CM | POA: Diagnosis present

## 2017-05-15 DIAGNOSIS — R110 Nausea: Secondary | ICD-10-CM | POA: Diagnosis not present

## 2017-05-15 DIAGNOSIS — Z9049 Acquired absence of other specified parts of digestive tract: Secondary | ICD-10-CM | POA: Diagnosis not present

## 2017-05-15 DIAGNOSIS — K5900 Constipation, unspecified: Secondary | ICD-10-CM | POA: Diagnosis present

## 2017-05-21 MED ORDER — SODIUM CHLORIDE 0.9 % IJ SOLN
10.00 | INTRAMUSCULAR | Status: DC
Start: ? — End: 2017-05-21

## 2017-05-21 MED ORDER — ACETAMINOPHEN 500 MG PO TABS
1000.00 | ORAL_TABLET | ORAL | Status: DC
Start: 2017-05-21 — End: 2017-05-21

## 2017-05-21 MED ORDER — GENERIC EXTERNAL MEDICATION
3.00 | Status: DC
Start: ? — End: 2017-05-21

## 2017-05-21 MED ORDER — ONDANSETRON HCL 4 MG/2ML IJ SOLN
4.00 | INTRAMUSCULAR | Status: DC
Start: ? — End: 2017-05-21

## 2017-05-21 MED ORDER — SCOPOLAMINE 1 MG/3DAYS TD PT72
1.00 | MEDICATED_PATCH | TRANSDERMAL | Status: DC
Start: 2017-05-23 — End: 2017-05-21

## 2017-05-21 MED ORDER — DIPHENHYDRAMINE HCL 50 MG/ML IJ SOLN
12.50 | INTRAMUSCULAR | Status: DC
Start: ? — End: 2017-05-21

## 2017-05-21 MED ORDER — GENERIC EXTERNAL MEDICATION
2.00 | Status: DC
Start: 2017-05-21 — End: 2017-05-21

## 2017-05-21 MED ORDER — HYDROMORPHONE HCL 2 MG PO TABS
4.00 | ORAL_TABLET | ORAL | Status: DC
Start: ? — End: 2017-05-21

## 2017-05-21 MED ORDER — SODIUM CHLORIDE 0.9 % IJ SOLN
10.00 | INTRAMUSCULAR | Status: DC
Start: 2017-05-21 — End: 2017-05-21

## 2017-05-21 MED ORDER — OLANZAPINE 5 MG PO TABS
15.00 | ORAL_TABLET | ORAL | Status: DC
Start: 2017-05-22 — End: 2017-05-21

## 2017-05-21 MED ORDER — PROMETHAZINE HCL 25 MG/ML IJ SOLN
12.50 | INTRAMUSCULAR | Status: DC
Start: ? — End: 2017-05-21

## 2017-05-21 MED ORDER — HYDROMORPHONE HCL 1 MG/ML IJ SOLN
0.25 | INTRAMUSCULAR | Status: DC
Start: ? — End: 2017-05-21

## 2017-05-21 MED ORDER — TEMAZEPAM 15 MG PO CAPS
30.00 | ORAL_CAPSULE | ORAL | Status: DC
Start: ? — End: 2017-05-21

## 2017-05-21 MED ORDER — GABAPENTIN 300 MG PO CAPS
300.00 | ORAL_CAPSULE | ORAL | Status: DC
Start: 2017-05-21 — End: 2017-05-21

## 2017-05-21 MED ORDER — PANTOPRAZOLE SODIUM 40 MG IV SOLR
40.00 | INTRAVENOUS | Status: DC
Start: 2017-05-21 — End: 2017-05-21

## 2017-05-21 MED ORDER — SUCRALFATE 1 GM/10ML PO SUSP
1.00 | ORAL | Status: DC
Start: 2017-05-21 — End: 2017-05-21

## 2017-05-21 MED ORDER — APIXABAN 2.5 MG PO TABS
2.50 | ORAL_TABLET | ORAL | Status: DC
Start: 2017-05-21 — End: 2017-05-21

## 2017-05-21 MED ORDER — HYDROCORTISONE 1 % EX CREA
TOPICAL_CREAM | CUTANEOUS | Status: DC
Start: 2017-05-21 — End: 2017-05-21

## 2017-05-21 MED ORDER — POLYETHYLENE GLYCOL 3350 17 G PO PACK
17.00 | PACK | ORAL | Status: DC
Start: 2017-05-21 — End: 2017-05-21

## 2017-05-22 DIAGNOSIS — Z6830 Body mass index (BMI) 30.0-30.9, adult: Secondary | ICD-10-CM | POA: Diagnosis not present

## 2017-05-22 DIAGNOSIS — R112 Nausea with vomiting, unspecified: Secondary | ICD-10-CM | POA: Diagnosis not present

## 2017-05-22 DIAGNOSIS — E669 Obesity, unspecified: Secondary | ICD-10-CM | POA: Diagnosis not present

## 2017-05-22 DIAGNOSIS — R110 Nausea: Secondary | ICD-10-CM | POA: Diagnosis not present

## 2017-05-22 DIAGNOSIS — Z48815 Encounter for surgical aftercare following surgery on the digestive system: Secondary | ICD-10-CM | POA: Diagnosis not present

## 2017-05-22 DIAGNOSIS — Z9884 Bariatric surgery status: Secondary | ICD-10-CM | POA: Diagnosis not present

## 2017-05-22 DIAGNOSIS — K5900 Constipation, unspecified: Secondary | ICD-10-CM | POA: Diagnosis not present

## 2017-05-22 DIAGNOSIS — Z713 Dietary counseling and surveillance: Secondary | ICD-10-CM | POA: Diagnosis not present

## 2017-05-29 ENCOUNTER — Encounter: Payer: Self-pay | Admitting: Family Medicine

## 2017-05-30 ENCOUNTER — Encounter (HOSPITAL_COMMUNITY): Payer: Medicaid Other

## 2017-05-30 MED ORDER — VARENICLINE TARTRATE 1 MG PO TABS: 1.0000 mg | ORAL_TABLET | Freq: Two times a day (BID) | ORAL | 1 refills | Status: DC

## 2017-05-30 MED ORDER — VARENICLINE TARTRATE 0.5 MG X 11 & 1 MG X 42 PO MISC: ORAL | 0 refills | Status: DC

## 2017-06-04 ENCOUNTER — Other Ambulatory Visit: Payer: Self-pay | Admitting: Allergy & Immunology

## 2017-06-10 DIAGNOSIS — F419 Anxiety disorder, unspecified: Secondary | ICD-10-CM | POA: Diagnosis not present

## 2017-06-10 DIAGNOSIS — F329 Major depressive disorder, single episode, unspecified: Secondary | ICD-10-CM | POA: Diagnosis not present

## 2017-06-10 DIAGNOSIS — F4310 Post-traumatic stress disorder, unspecified: Secondary | ICD-10-CM | POA: Diagnosis not present

## 2017-06-11 ENCOUNTER — Ambulatory Visit (INDEPENDENT_AMBULATORY_CARE_PROVIDER_SITE_OTHER): Payer: Medicare Other | Admitting: Family Medicine

## 2017-06-11 VITALS — BP 108/70 | Ht 64.0 in | Wt 181.2 lb

## 2017-06-11 DIAGNOSIS — S134XXA Sprain of ligaments of cervical spine, initial encounter: Secondary | ICD-10-CM | POA: Diagnosis not present

## 2017-06-11 DIAGNOSIS — M9903 Segmental and somatic dysfunction of lumbar region: Secondary | ICD-10-CM | POA: Diagnosis not present

## 2017-06-11 DIAGNOSIS — M9901 Segmental and somatic dysfunction of cervical region: Secondary | ICD-10-CM | POA: Diagnosis not present

## 2017-06-11 DIAGNOSIS — M546 Pain in thoracic spine: Secondary | ICD-10-CM | POA: Diagnosis not present

## 2017-06-11 DIAGNOSIS — G4733 Obstructive sleep apnea (adult) (pediatric): Secondary | ICD-10-CM

## 2017-06-11 DIAGNOSIS — S335XXA Sprain of ligaments of lumbar spine, initial encounter: Secondary | ICD-10-CM | POA: Diagnosis not present

## 2017-06-11 DIAGNOSIS — M9902 Segmental and somatic dysfunction of thoracic region: Secondary | ICD-10-CM | POA: Diagnosis not present

## 2017-06-11 NOTE — Progress Notes (Signed)
Subjective:    Patient ID: [name], female    DOB: 1980-09-13, 37 y.o.   MRN: [id number]  HPI Patient arrives to discuss the need for a sleep study.pts breathing slowed down at night, had oxygen placed on her, had lo oxygen and d "stopped breathing"  Pos hx of sleep apnea in th past before gstrectomy  Sig prob with daytime drowsiness  Pt took a tumble and fell on her knee and experiencing some discomfort  Has tried tylenol and icy hot an d needs to see hirppractor  tyl was rec for the+ Low energy thru the day  Pt has mild snoring but not loud snoring   Sleep study the hosp docs ecommended      Went to [facility] for psychiatric care    having trouble swallowing meds   Pt has had 44 endoscopies per her hx!   Review of Systems No headache, no major weight loss or weight gain, no chest pain no back pain abdominal pain no change in bowel habits complete ROS otherwise negative     Objective:   Physical Exam  Alert and oriented, vitals reviewed and stable, NAD ENT-TM's and ext canals WNL bilat via otoscopic exam Soft palate, tonsils and post pharynx WNL via oropharyngeal exam Neck-symmetric, no masses; thyroid nonpalpable and nontender Pulmonary-no tachypnea or accessory muscle use; Clear without wheezes via auscultation Card--no abnrml murmurs, rhythm reg and rate WNL Carotid pulses symmetric, without bruits       Assessment & Plan:  Impression #1 potential sleep apnea.  Patient had it in the past before weight loss from gastric surgery.  Now multiple spells of not breathing at night while in the hospital at last visit.  Doctors there and advised sleep study.  Substantial snoring.  Substantial daytime fatigue.  Positive disturbance, will work on scheduling sleep study.  Greater than 50% of this 25 minute face to face visit was spent in counseling and discussion and coordination of care regarding the above diagnosis/diagnosies

## 2017-06-17 ENCOUNTER — Encounter (HOSPITAL_COMMUNITY)
Admission: RE | Admit: 2017-06-17 | Discharge: 2017-06-17 | Disposition: A | Discharge status: Home / Self Care | Payer: Medicare Other | Source: Ambulatory Visit | Attending: Family Medicine | Admitting: Family Medicine

## 2017-06-17 DIAGNOSIS — R5382 Chronic fatigue, unspecified: Secondary | ICD-10-CM | POA: Diagnosis not present

## 2017-06-17 DIAGNOSIS — K912 Postsurgical malabsorption, not elsewhere classified: Secondary | ICD-10-CM | POA: Insufficient documentation

## 2017-06-17 DIAGNOSIS — Z452 Encounter for adjustment and management of vascular access device: Secondary | ICD-10-CM | POA: Diagnosis not present

## 2017-06-17 DIAGNOSIS — D539 Nutritional anemia, unspecified: Secondary | ICD-10-CM | POA: Insufficient documentation

## 2017-06-17 LAB — FERRITIN: FERRITIN: 156 ng/mL (ref 11–307)

## 2017-06-17 LAB — IRON AND TIBC
Iron: 91 ug/dL (ref 28–170)
Saturation Ratios: 24 % (ref 10.4–31.8)
TIBC: 375 ug/dL (ref 250–450)
UIBC: 284 ug/dL

## 2017-06-17 LAB — CBC
HCT: 43.7 % (ref 36.0–46.0)
Hemoglobin: 14.2 g/dL (ref 12.0–15.0)
MCH: 30.2 pg (ref 26.0–34.0)
MCHC: 32.5 g/dL (ref 30.0–36.0)
MCV: 93 fL (ref 78.0–100.0)
PLATELETS: 273 10*3/uL (ref 150–400)
RBC: 4.7 MIL/uL (ref 3.87–5.11)
RDW: 14.5 % (ref 11.5–15.5)
WBC: 9.4 10*3/uL (ref 4.0–10.5)

## 2017-06-17 MED ORDER — SODIUM CHLORIDE 0.9% FLUSH
10.0000 mL | INTRAVENOUS | Status: AC | PRN
  Administered 2017-06-17: 10 mL

## 2017-06-17 MED ORDER — HEPARIN SOD (PORK) LOCK FLUSH 100 UNIT/ML IV SOLN
500.0000 [IU] | INTRAVENOUS | Status: AC | PRN
  Administered 2017-06-17: 500 [IU]

## 2017-06-18 DIAGNOSIS — M9902 Segmental and somatic dysfunction of thoracic region: Secondary | ICD-10-CM | POA: Diagnosis not present

## 2017-06-18 DIAGNOSIS — H5371 Glare sensitivity: Secondary | ICD-10-CM | POA: Diagnosis not present

## 2017-06-18 DIAGNOSIS — S134XXA Sprain of ligaments of cervical spine, initial encounter: Secondary | ICD-10-CM | POA: Diagnosis not present

## 2017-06-18 DIAGNOSIS — M9903 Segmental and somatic dysfunction of lumbar region: Secondary | ICD-10-CM | POA: Diagnosis not present

## 2017-06-18 DIAGNOSIS — M546 Pain in thoracic spine: Secondary | ICD-10-CM | POA: Diagnosis not present

## 2017-06-18 DIAGNOSIS — H538 Other visual disturbances: Secondary | ICD-10-CM | POA: Diagnosis not present

## 2017-06-18 DIAGNOSIS — S335XXA Sprain of ligaments of lumbar spine, initial encounter: Secondary | ICD-10-CM | POA: Diagnosis not present

## 2017-06-18 DIAGNOSIS — M9901 Segmental and somatic dysfunction of cervical region: Secondary | ICD-10-CM | POA: Diagnosis not present

## 2017-06-18 DIAGNOSIS — H04123 Dry eye syndrome of bilateral lacrimal glands: Secondary | ICD-10-CM | POA: Diagnosis not present

## 2017-06-19 NOTE — Progress Notes (Signed)
Results for [name], [name] (MRN [id number]) as of 06/19/2017 13:12  Ref. Range 06/17/2017 14:53  Iron Latest Ref Range: 28 - 170 ug/dL 91  UIBC Latest Units: ug/dL 284  TIBC Latest Ref Range: 250 - 450 ug/dL 375  Saturation Ratios Latest Ref Range: 10.4 - 31.8 % 24  Ferritin Latest Ref Range: 11 - 307 ng/mL 156  WBC Latest Ref Range: 4.0 - 10.5 K/uL 9.4  RBC Latest Ref Range: 3.87 - 5.11 MIL/uL 4.70  Hemoglobin Latest Ref Range: 12.0 - 15.0 g/dL 14.2  HCT Latest Ref Range: 36.0 - 46.0 % 43.7  MCV Latest Ref Range: 78.0 - 100.0 fL 93.0  MCH Latest Ref Range: 26.0 - 34.0 pg 30.2  MCHC Latest Ref Range: 30.0 - 36.0 g/dL 32.5  RDW Latest Ref Range: 11.5 - 15.5 % 14.5  Platelets Latest Ref Range: 150 - 400 K/uL 273

## 2017-06-20 DIAGNOSIS — Z30431 Encounter for routine checking of intrauterine contraceptive device: Secondary | ICD-10-CM | POA: Diagnosis not present

## 2017-06-20 DIAGNOSIS — R87612 Low grade squamous intraepithelial lesion on cytologic smear of cervix (LGSIL): Secondary | ICD-10-CM | POA: Diagnosis not present

## 2017-06-20 DIAGNOSIS — Z113 Encounter for screening for infections with a predominantly sexual mode of transmission: Secondary | ICD-10-CM | POA: Diagnosis not present

## 2017-06-20 DIAGNOSIS — Z87898 Personal history of other specified conditions: Secondary | ICD-10-CM | POA: Diagnosis not present

## 2017-06-21 ENCOUNTER — Encounter (HOSPITAL_COMMUNITY)
Admission: RE | Admit: 2017-06-21 | Discharge: 2017-06-21 | Disposition: A | Discharge status: Home / Self Care | Payer: Medicare Other | Source: Ambulatory Visit | Attending: Family Medicine | Admitting: Family Medicine

## 2017-06-21 ENCOUNTER — Telehealth: Payer: Self-pay | Admitting: Family Medicine

## 2017-06-21 DIAGNOSIS — R5383 Other fatigue: Secondary | ICD-10-CM

## 2017-06-21 DIAGNOSIS — Z452 Encounter for adjustment and management of vascular access device: Secondary | ICD-10-CM | POA: Diagnosis not present

## 2017-06-21 DIAGNOSIS — D539 Nutritional anemia, unspecified: Secondary | ICD-10-CM | POA: Diagnosis not present

## 2017-06-21 DIAGNOSIS — K912 Postsurgical malabsorption, not elsewhere classified: Secondary | ICD-10-CM | POA: Diagnosis not present

## 2017-06-21 DIAGNOSIS — R5382 Chronic fatigue, unspecified: Secondary | ICD-10-CM | POA: Diagnosis not present

## 2017-06-21 DIAGNOSIS — R0683 Snoring: Secondary | ICD-10-CM

## 2017-06-21 MED ORDER — ALTEPLASE 2 MG IJ SOLR
2.0000 mg | Freq: Once | INTRAMUSCULAR | Status: DC | PRN
  Filled 2017-06-21: qty 2

## 2017-06-21 MED ORDER — SODIUM CHLORIDE 0.9% FLUSH
10.0000 mL | INTRAVENOUS | Status: AC | PRN
  Administered 2017-06-21: 10 mL
  Filled 2017-06-21: qty 10

## 2017-06-21 MED ORDER — HEPARIN SOD (PORK) LOCK FLUSH 100 UNIT/ML IV SOLN
500.0000 [IU] | INTRAVENOUS | Status: AC | PRN
  Administered 2017-06-21: 500 [IU]
  Filled 2017-06-21: qty 5

## 2017-06-21 NOTE — Telephone Encounter (Signed)
Patient called to check on status of scheduling sleep study.  She seen Dr. Richardson Landry on 06/11/17.  I do not see anything in the system, please advise.

## 2017-06-21 NOTE — Progress Notes (Signed)
Here for cathflo to right chest port-a-cath. Port accessed. Great blood returned. Flushed with heparin. No cathflo needed. Gauze drsg to site. Tolerated well.

## 2017-06-24 NOTE — Telephone Encounter (Signed)
Patient is aware referral placed in epic await to hear from their office.

## 2017-06-24 NOTE — Telephone Encounter (Signed)
Pt had a lot that day, I will go back and amend note, of course her [facility] specialist wanted a sleep study. Lets order one for daytime fatigue and snoring and breathing stopping post anaesthesia

## 2017-06-25 DIAGNOSIS — M546 Pain in thoracic spine: Secondary | ICD-10-CM | POA: Diagnosis not present

## 2017-06-25 DIAGNOSIS — S134XXA Sprain of ligaments of cervical spine, initial encounter: Secondary | ICD-10-CM | POA: Diagnosis not present

## 2017-06-25 DIAGNOSIS — M9901 Segmental and somatic dysfunction of cervical region: Secondary | ICD-10-CM | POA: Diagnosis not present

## 2017-06-25 DIAGNOSIS — M9902 Segmental and somatic dysfunction of thoracic region: Secondary | ICD-10-CM | POA: Diagnosis not present

## 2017-06-25 DIAGNOSIS — S335XXA Sprain of ligaments of lumbar spine, initial encounter: Secondary | ICD-10-CM | POA: Diagnosis not present

## 2017-06-25 DIAGNOSIS — M9903 Segmental and somatic dysfunction of lumbar region: Secondary | ICD-10-CM | POA: Diagnosis not present

## 2017-06-28 ENCOUNTER — Telehealth: Payer: Self-pay | Admitting: Family Medicine

## 2017-06-28 NOTE — Telephone Encounter (Signed)
Sent signed order for sleep study, await appt info

## 2017-06-28 NOTE — Telephone Encounter (Signed)
Please sign & date order for sleep study in green folder in yellow box & forward to me to send for scheduling

## 2017-07-01 ENCOUNTER — Encounter (HOSPITAL_COMMUNITY): Payer: Medicare Other

## 2017-07-01 DIAGNOSIS — B977 Papillomavirus as the cause of diseases classified elsewhere: Secondary | ICD-10-CM | POA: Diagnosis not present

## 2017-07-01 DIAGNOSIS — R87612 Low grade squamous intraepithelial lesion on cytologic smear of cervix (LGSIL): Secondary | ICD-10-CM | POA: Diagnosis not present

## 2017-07-02 ENCOUNTER — Ambulatory Visit: Payer: Medicare Other | Admitting: Allergy & Immunology

## 2017-07-02 DIAGNOSIS — R87612 Low grade squamous intraepithelial lesion on cytologic smear of cervix (LGSIL): Secondary | ICD-10-CM | POA: Diagnosis not present

## 2017-07-02 DIAGNOSIS — B977 Papillomavirus as the cause of diseases classified elsewhere: Secondary | ICD-10-CM | POA: Diagnosis not present

## 2017-07-09 DIAGNOSIS — F419 Anxiety disorder, unspecified: Secondary | ICD-10-CM | POA: Diagnosis not present

## 2017-07-09 DIAGNOSIS — F329 Major depressive disorder, single episode, unspecified: Secondary | ICD-10-CM | POA: Diagnosis not present

## 2017-07-09 DIAGNOSIS — F4310 Post-traumatic stress disorder, unspecified: Secondary | ICD-10-CM | POA: Diagnosis not present

## 2017-07-11 ENCOUNTER — Ambulatory Visit: Payer: Medicare Other | Attending: Family Medicine | Admitting: Neurology

## 2017-07-11 DIAGNOSIS — R5383 Other fatigue: Secondary | ICD-10-CM

## 2017-07-11 DIAGNOSIS — G4733 Obstructive sleep apnea (adult) (pediatric): Secondary | ICD-10-CM | POA: Diagnosis not present

## 2017-07-11 DIAGNOSIS — Z7901 Long term (current) use of anticoagulants: Secondary | ICD-10-CM | POA: Diagnosis not present

## 2017-07-11 DIAGNOSIS — Z79899 Other long term (current) drug therapy: Secondary | ICD-10-CM | POA: Insufficient documentation

## 2017-07-11 DIAGNOSIS — R0683 Snoring: Secondary | ICD-10-CM

## 2017-07-14 NOTE — Procedures (Signed)
[facility] Marylyn Appenzeller A. Gerilyn Pilgrim, MD     [email or web address]             NOCTURNAL POLYSOMNOGRAPHY   LOCATION: [facility]  Patient Name: Erica, Wong Date: 07/11/2017 Gender: Female D.O.B: March 16, 1981 Age (years): 36 Referring Provider: Lilyan Punt Height (inches): 64 Interpreting Physician: Beryle Beams MD, ABSM Weight (lbs): 181 RPSGT: Peak, Robert BMI: 31 MRN: [id number] Neck Size: 15.00 CLINICAL INFORMATION Sleep Study Type: NPSG     Indication for sleep study: Fatigue, OSA, Snoring     Epworth Sleepiness Score: 10     SLEEP STUDY TECHNIQUE As per the AASM Manual for the Scoring of Sleep and Associated Events v2.3 (April 2016) with a hypopnea requiring 4% desaturations.  The channels recorded and monitored were frontal, central and occipital EEG, electrooculogram (EOG), submentalis EMG (chin), nasal and oral airflow, thoracic and abdominal wall motion, anterior tibialis EMG, snore microphone, electrocardiogram, and pulse oximetry.  MEDICATIONS Medications self-administered by patient taken the night of the study : N/A  Current Outpatient Medications:  .  albuterol (VENTOLIN HFA) 108 (90 Base) MCG/ACT inhaler, Inhale 2 puffs into the lungs 4 (four) times daily as needed for wheezing or shortness of breath., Disp: 18 g, Rfl: 2 .  clonazePAM (KLONOPIN) 1 MG tablet, Take 1 mg by mouth every 6 (six) hours as needed. , Disp: , Rfl:  .  Crisaborole (EUCRISA) 2 % OINT, Apply 1 application topically 2 (two) times daily., Disp: 60 g, Rfl: 2 .  Cyanocobalamin (B-12) 1000 MCG TABS, Take 1 tablet by mouth daily., Disp: , Rfl:  .  cycloSPORINE (RESTASIS) 0.05 % ophthalmic emulsion, Place 1 drop into both eyes 2 (two) times daily., Disp: , Rfl:  .  ELIQUIS 5 MG TABS tablet, Take 5 mg by mouth 2 (two) times daily. , Disp: , Rfl:  .  EPINEPHrine 0.3 mg/0.3 mL IJ SOAJ injection, once., Disp: , Rfl:  .  FLUoxetine (PROZAC) 20 MG capsule, Take 3 capsules (60  mg total) by mouth daily., Disp: 360 capsule, Rfl: 0 .  fluticasone (FLOVENT HFA) 110 MCG/ACT inhaler, Inhale 2 puffs into the lungs 2 (two) times daily., Disp: 1 Inhaler, Rfl: 5 .  gabapentin (NEURONTIN) 600 MG tablet, Take 300 mg by mouth 3 (three) times daily. Take one in am, one at noon, and three at bedtime, Disp: , Rfl:  .  levocetirizine (XYZAL) 5 MG tablet, Take 2 tablets (10 mg total) by mouth daily., Disp: 180 tablet, Rfl: 3 .  Levonorgestrel (MIRENA IU), by Intrauterine route., Disp: , Rfl:  .  montelukast (SINGULAIR) 10 MG tablet, TAKE 1 TABLET BY MOUTH EVERYDAY AT BEDTIME, Disp: 30 tablet, Rfl: 5 .  Multiple Vitamin (MULTIVITAMIN WITH MINERALS) TABS tablet, Take 1 tablet by mouth daily., Disp: , Rfl:  .  OLANZapine (ZYPREXA) 10 MG tablet, Take 20 mg by mouth at bedtime. , Disp: , Rfl:  .  Omega-3 Fatty Acids (FISH OIL PO), Take 1 capsule by mouth 2 (two) times daily., Disp: , Rfl:  .  Probiotic Product (PROBIOTIC-10 PO), Take 1 capsule by mouth daily., Disp: , Rfl:  .  promethazine (PHENERGAN) 25 MG tablet, Take 1 tablet (25 mg total) by mouth every 6 (six) hours as needed for nausea or vomiting., Disp: 24 tablet, Rfl: 5 .  ranitidine (ZANTAC) 300 MG tablet, TAKE 1 TABLET (300 MG TOTAL) BY MOUTH AT BEDTIME., Disp: 30 tablet, Rfl: 4 .  triamcinolone (KENALOG) 0.025 % ointment, APPLY TO AFFECTED AREA TWICE A DAY, Disp:  30 g, Rfl: 0 .  varenicline (CHANTIX CONTINUING MONTH PAK) 1 MG tablet, Take 1 tablet (1 mg total) by mouth 2 (two) times daily., Disp: 60 tablet, Rfl: 1 .  varenicline (CHANTIX STARTING MONTH PAK) 0.5 MG X 11 & 1 MG X 42 tablet, Take one 0.5 mg tablet by mouth once daily for 3 days, then one 0.5 mg tablet twice daily for 4 days, then one 1 mg tablet twice daily., Disp: 53 tablet, Rfl: 0  Current Facility-Administered Medications:  .  omalizumab Geoffry Paradise) injection 300 mg, 300 mg, Subcutaneous, Q28 days, Alfonse Spruce, MD, 300 mg at 02/19/17 1126     SLEEP  ARCHITECTURE The study was initiated at 10:54:53 PM and ended at 4:59:36 AM.  Sleep onset time was 10.9 minutes and the sleep efficiency was 96.3%. The total sleep time was 351.3 minutes.  Stage REM latency was 249.5 minutes.  The patient spent 4.13% of the night in stage N1 sleep, 87.19% in stage N2 sleep, 0.43% in stage N3 and 8.26% in REM.  Alpha intrusion was absent.  Supine sleep was 0.00%.  RESPIRATORY PARAMETERS The overall apnea/hypopnea index (AHI) was 0.0 per hour. There were 0 total apneas, including 0 obstructive, 0 central and 0 mixed apneas. There were 0 hypopneas and 1 RERAs.  The AHI during Stage REM sleep was 0.0 per hour.  AHI while supine was N/A per hour.  The mean oxygen saturation was 91.28%. The minimum SpO2 during sleep was 88.00%.  soft snoring was noted during this study.  CARDIAC DATA The 2 lead EKG demonstrated sinus rhythm. The mean heart rate was 85.52 beats per minute. Other EKG findings include: None. LEG MOVEMENT DATA The total PLMS were 0 with a resulting PLMS index of 0.00. Associated arousal with leg movement index was 0.0.  IMPRESSIONS No significant obstructive sleep apnea occurred during this study. No significant central sleep apnea occurred during this study. Clinically significant periodic limb movements did not occur during sleep. No significant associated arousals.   Argie Ramming, MD Diplomate, American Board of Sleep Medicine.  ELECTRONICALLY SIGNED ON:  07/14/2017, 10:36 PM [facility] PH: [phone]   FX: [phone] ACCREDITED BY THE AMERICAN ACADEMY OF SLEEP MEDICINE

## 2017-07-19 ENCOUNTER — Encounter (HOSPITAL_COMMUNITY)
Admission: RE | Admit: 2017-07-19 | Discharge: 2017-07-19 | Disposition: A | Discharge status: Home / Self Care | Payer: Medicare Other | Source: Ambulatory Visit | Attending: Family Medicine | Admitting: Family Medicine

## 2017-07-19 DIAGNOSIS — Z452 Encounter for adjustment and management of vascular access device: Secondary | ICD-10-CM | POA: Diagnosis not present

## 2017-07-19 MED ORDER — HEPARIN SOD (PORK) LOCK FLUSH 100 UNIT/ML IV SOLN
500.0000 [IU] | INTRAVENOUS | Status: AC | PRN
  Administered 2017-07-19: 500 [IU]

## 2017-07-19 MED ORDER — SODIUM CHLORIDE 0.9% FLUSH
10.0000 mL | INTRAVENOUS | Status: AC | PRN
  Administered 2017-07-19: 30 mL

## 2017-07-19 MED ORDER — HEPARIN SOD (PORK) LOCK FLUSH 100 UNIT/ML IV SOLN
INTRAVENOUS | Status: AC
  Filled 2017-07-19: qty 5

## 2017-07-25 DIAGNOSIS — S335XXA Sprain of ligaments of lumbar spine, initial encounter: Secondary | ICD-10-CM | POA: Diagnosis not present

## 2017-07-25 DIAGNOSIS — M9902 Segmental and somatic dysfunction of thoracic region: Secondary | ICD-10-CM | POA: Diagnosis not present

## 2017-07-25 DIAGNOSIS — M9901 Segmental and somatic dysfunction of cervical region: Secondary | ICD-10-CM | POA: Diagnosis not present

## 2017-07-25 DIAGNOSIS — S134XXA Sprain of ligaments of cervical spine, initial encounter: Secondary | ICD-10-CM | POA: Diagnosis not present

## 2017-07-25 DIAGNOSIS — M9903 Segmental and somatic dysfunction of lumbar region: Secondary | ICD-10-CM | POA: Diagnosis not present

## 2017-07-25 DIAGNOSIS — M546 Pain in thoracic spine: Secondary | ICD-10-CM | POA: Diagnosis not present

## 2017-07-26 ENCOUNTER — Ambulatory Visit (INDEPENDENT_AMBULATORY_CARE_PROVIDER_SITE_OTHER): Payer: Medicare Other | Admitting: Nurse Practitioner

## 2017-07-26 ENCOUNTER — Encounter: Payer: Self-pay | Admitting: Nurse Practitioner

## 2017-07-26 VITALS — BP 132/90 | Ht 64.0 in | Wt 181.2 lb

## 2017-07-26 DIAGNOSIS — R5382 Chronic fatigue, unspecified: Secondary | ICD-10-CM | POA: Diagnosis not present

## 2017-07-27 ENCOUNTER — Encounter: Payer: Self-pay | Admitting: Nurse Practitioner

## 2017-07-27 NOTE — Progress Notes (Signed)
Subjective:  Presents for c/o fatigue that has been going on for weeks especially after her psychiatrist at [facility] stopped several of her meds. Told she was overmedicated. Having some nightmares and difficulty sleeping. Denies suicidal or homicidal thoughts or ideation. Has regular follow up with several specialists at [facility] including GI. Scheduled for cognitive behavioral therapy in April. Diagnosed with PTSD. No longer on Lithium or Zyprexa.   Objective:   BP 132/90   Ht 5\' 4"  (1.626 m)   Wt 181 lb 3.2 oz (82.2 kg)   BMI 31.10 kg/m  NAD. Alert, oriented. Mildly fatigued in appearance. Lungs clear. Heart RRR. Thoughts logical, coherent and relevant. Dressed appropriately. Making good eye contact.   Assessment:   Problem List Items Addressed This Visit      Other   Chronic fatigue - Primary      Plan:  No labs ordered today. Patient has had multiple labs over the past few months. Fatigue most likely related to recent changes in psychiatric medications. Continue follow up at [facility] for medication management and therapy. Seek help immediately if any suicidal thoughts.

## 2017-07-29 ENCOUNTER — Encounter (HOSPITAL_COMMUNITY): Payer: Medicare Other

## 2017-08-06 DIAGNOSIS — K9089 Other intestinal malabsorption: Secondary | ICD-10-CM | POA: Diagnosis not present

## 2017-08-06 DIAGNOSIS — Z5181 Encounter for therapeutic drug level monitoring: Secondary | ICD-10-CM | POA: Diagnosis not present

## 2017-08-06 DIAGNOSIS — F329 Major depressive disorder, single episode, unspecified: Secondary | ICD-10-CM | POA: Diagnosis not present

## 2017-08-06 DIAGNOSIS — F4310 Post-traumatic stress disorder, unspecified: Secondary | ICD-10-CM | POA: Diagnosis not present

## 2017-08-06 DIAGNOSIS — F419 Anxiety disorder, unspecified: Secondary | ICD-10-CM | POA: Diagnosis not present

## 2017-08-14 DIAGNOSIS — R1013 Epigastric pain: Secondary | ICD-10-CM | POA: Diagnosis not present

## 2017-08-14 DIAGNOSIS — K912 Postsurgical malabsorption, not elsewhere classified: Secondary | ICD-10-CM | POA: Diagnosis not present

## 2017-08-14 DIAGNOSIS — Z9884 Bariatric surgery status: Secondary | ICD-10-CM | POA: Diagnosis not present

## 2017-08-14 DIAGNOSIS — Z713 Dietary counseling and surveillance: Secondary | ICD-10-CM | POA: Diagnosis not present

## 2017-08-16 ENCOUNTER — Encounter (HOSPITAL_COMMUNITY)
Admission: RE | Admit: 2017-08-16 | Discharge: 2017-08-16 | Disposition: A | Discharge status: Home / Self Care | Payer: Medicare Other | Source: Ambulatory Visit | Attending: Family Medicine | Admitting: Family Medicine

## 2017-08-18 DIAGNOSIS — R748 Abnormal levels of other serum enzymes: Secondary | ICD-10-CM | POA: Diagnosis not present

## 2017-08-18 DIAGNOSIS — N3001 Acute cystitis with hematuria: Secondary | ICD-10-CM | POA: Diagnosis not present

## 2017-08-18 DIAGNOSIS — Z903 Acquired absence of stomach [part of]: Secondary | ICD-10-CM | POA: Diagnosis not present

## 2017-08-18 DIAGNOSIS — E860 Dehydration: Secondary | ICD-10-CM | POA: Diagnosis not present

## 2017-08-18 DIAGNOSIS — R1110 Vomiting, unspecified: Secondary | ICD-10-CM | POA: Diagnosis not present

## 2017-08-19 DIAGNOSIS — R112 Nausea with vomiting, unspecified: Secondary | ICD-10-CM | POA: Diagnosis not present

## 2017-08-19 DIAGNOSIS — R748 Abnormal levels of other serum enzymes: Secondary | ICD-10-CM | POA: Diagnosis not present

## 2017-08-19 DIAGNOSIS — R109 Unspecified abdominal pain: Secondary | ICD-10-CM | POA: Diagnosis not present

## 2017-08-19 DIAGNOSIS — Z9049 Acquired absence of other specified parts of digestive tract: Secondary | ICD-10-CM | POA: Diagnosis not present

## 2017-08-20 DIAGNOSIS — R140 Abdominal distension (gaseous): Secondary | ICD-10-CM | POA: Diagnosis not present

## 2017-08-20 DIAGNOSIS — R110 Nausea: Secondary | ICD-10-CM | POA: Diagnosis not present

## 2017-08-21 DIAGNOSIS — D509 Iron deficiency anemia, unspecified: Secondary | ICD-10-CM | POA: Diagnosis present

## 2017-08-21 DIAGNOSIS — E162 Hypoglycemia, unspecified: Secondary | ICD-10-CM | POA: Diagnosis not present

## 2017-08-21 DIAGNOSIS — R110 Nausea: Secondary | ICD-10-CM | POA: Diagnosis not present

## 2017-08-21 DIAGNOSIS — K5900 Constipation, unspecified: Secondary | ICD-10-CM | POA: Diagnosis present

## 2017-08-21 DIAGNOSIS — F419 Anxiety disorder, unspecified: Secondary | ICD-10-CM | POA: Diagnosis present

## 2017-08-21 DIAGNOSIS — N12 Tubulo-interstitial nephritis, not specified as acute or chronic: Secondary | ICD-10-CM | POA: Diagnosis present

## 2017-08-21 DIAGNOSIS — J40 Bronchitis, not specified as acute or chronic: Secondary | ICD-10-CM | POA: Diagnosis present

## 2017-08-21 DIAGNOSIS — Z9884 Bariatric surgery status: Secondary | ICD-10-CM | POA: Diagnosis not present

## 2017-08-21 DIAGNOSIS — Z7901 Long term (current) use of anticoagulants: Secondary | ICD-10-CM | POA: Diagnosis not present

## 2017-08-21 DIAGNOSIS — Z9049 Acquired absence of other specified parts of digestive tract: Secondary | ICD-10-CM | POA: Diagnosis not present

## 2017-08-21 DIAGNOSIS — K219 Gastro-esophageal reflux disease without esophagitis: Secondary | ICD-10-CM | POA: Diagnosis present

## 2017-08-21 DIAGNOSIS — F329 Major depressive disorder, single episode, unspecified: Secondary | ICD-10-CM | POA: Diagnosis present

## 2017-08-21 DIAGNOSIS — K912 Postsurgical malabsorption, not elsewhere classified: Secondary | ICD-10-CM | POA: Diagnosis present

## 2017-08-21 DIAGNOSIS — Z86718 Personal history of other venous thrombosis and embolism: Secondary | ICD-10-CM | POA: Diagnosis not present

## 2017-08-25 MED ORDER — GABAPENTIN 100 MG PO CAPS
100.00 | ORAL_CAPSULE | ORAL | Status: DC
Start: 2017-08-25 — End: 2017-08-25

## 2017-08-25 MED ORDER — HYDROMORPHONE HCL 1 MG/ML IJ SOLN
.50 | INTRAMUSCULAR | Status: DC
Start: ? — End: 2017-08-25

## 2017-08-25 MED ORDER — PANTOPRAZOLE SODIUM 40 MG PO TBEC
40.00 | DELAYED_RELEASE_TABLET | ORAL | Status: DC
Start: 2017-08-25 — End: 2017-08-25

## 2017-08-25 MED ORDER — OXYCODONE HCL 5 MG PO TABS
5.00 | ORAL_TABLET | ORAL | Status: DC
Start: ? — End: 2017-08-25

## 2017-08-25 MED ORDER — MAGNESIUM HYDROXIDE 400 MG/5ML PO SUSP
15.00 | ORAL | Status: DC
Start: 2017-08-25 — End: 2017-08-25

## 2017-08-25 MED ORDER — MELATONIN 3 MG PO TABS
3.00 | ORAL_TABLET | ORAL | Status: DC
Start: 2017-08-25 — End: 2017-08-25

## 2017-08-25 MED ORDER — POLYETHYLENE GLYCOL 3350 17 G PO PACK
17.00 | PACK | ORAL | Status: DC
Start: 2017-08-25 — End: 2017-08-25

## 2017-08-25 MED ORDER — MONTELUKAST SODIUM 10 MG PO TABS
10.00 | ORAL_TABLET | ORAL | Status: DC
Start: 2017-08-25 — End: 2017-08-25

## 2017-08-25 MED ORDER — GLUCAGON HCL RDNA (DIAGNOSTIC) 1 MG IJ SOLR
1.00 | INTRAMUSCULAR | Status: DC
Start: ? — End: 2017-08-25

## 2017-08-25 MED ORDER — POTASSIUM CHLORIDE CRYS ER 20 MEQ PO TBCR
40.00 | EXTENDED_RELEASE_TABLET | ORAL | Status: DC
Start: 2017-08-25 — End: 2017-08-25

## 2017-08-25 MED ORDER — GENERIC EXTERNAL MEDICATION
1.00 | Status: DC
Start: 2017-08-25 — End: 2017-08-25

## 2017-08-25 MED ORDER — ALBUTEROL SULFATE HFA 108 (90 BASE) MCG/ACT IN AERS
2.00 | INHALATION_SPRAY | RESPIRATORY_TRACT | Status: DC
Start: ? — End: 2017-08-25

## 2017-08-25 MED ORDER — DIPHENHYDRAMINE HCL 25 MG PO CAPS
25.00 | ORAL_CAPSULE | ORAL | Status: DC
Start: ? — End: 2017-08-25

## 2017-08-25 MED ORDER — QUETIAPINE FUMARATE 25 MG PO TABS
100.00 | ORAL_TABLET | ORAL | Status: DC
Start: ? — End: 2017-08-25

## 2017-08-25 MED ORDER — LIDOCAINE HCL (PF) 1 % IJ SOLN
0.50 | INTRAMUSCULAR | Status: DC
Start: ? — End: 2017-08-25

## 2017-08-25 MED ORDER — GENERIC EXTERNAL MEDICATION
3.00 | Status: DC
Start: 2017-08-25 — End: 2017-08-25

## 2017-08-25 MED ORDER — SCOPOLAMINE 1 MG/3DAYS TD PT72
1.00 | MEDICATED_PATCH | TRANSDERMAL | Status: DC
Start: 2017-08-26 — End: 2017-08-25

## 2017-08-25 MED ORDER — DEXTROSE 50 % IV SOLN
12.50 | INTRAVENOUS | Status: DC
Start: ? — End: 2017-08-25

## 2017-08-25 MED ORDER — THIAMINE HCL 100 MG PO TABS
100.00 | ORAL_TABLET | ORAL | Status: DC
Start: 2017-08-25 — End: 2017-08-25

## 2017-08-25 MED ORDER — THERA PO TABS
1.00 | ORAL_TABLET | ORAL | Status: DC
Start: 2017-08-25 — End: 2017-08-25

## 2017-08-25 MED ORDER — GRX ANALGESIC BALM EX OINT
TOPICAL_OINTMENT | CUTANEOUS | Status: DC
Start: 2017-08-25 — End: 2017-08-25

## 2017-08-25 MED ORDER — TRAZODONE HCL 50 MG PO TABS
50.00 | ORAL_TABLET | ORAL | Status: DC
Start: 2017-08-25 — End: 2017-08-25

## 2017-08-25 MED ORDER — APIXABAN 2.5 MG PO TABS
2.50 | ORAL_TABLET | ORAL | Status: DC
Start: 2017-08-25 — End: 2017-08-25

## 2017-08-25 MED ORDER — ONDANSETRON HCL 4 MG/2ML IJ SOLN
4.00 | INTRAMUSCULAR | Status: DC
Start: ? — End: 2017-08-25

## 2017-08-25 MED ORDER — PROMETHAZINE HCL 25 MG/ML IJ SOLN
12.50 | INTRAMUSCULAR | Status: DC
Start: ? — End: 2017-08-25

## 2017-08-25 MED ORDER — ACETAMINOPHEN 325 MG PO TABS
650.00 | ORAL_TABLET | ORAL | Status: DC
Start: ? — End: 2017-08-25

## 2017-08-25 MED ORDER — GENERIC EXTERNAL MEDICATION
3.38 | Status: DC
Start: 2017-08-25 — End: 2017-08-25

## 2017-08-25 MED ORDER — FAMOTIDINE 20 MG PO TABS
40.00 | ORAL_TABLET | ORAL | Status: DC
Start: 2017-08-25 — End: 2017-08-25

## 2017-08-25 MED ORDER — FLUOXETINE HCL 20 MG PO CAPS
60.00 | ORAL_CAPSULE | ORAL | Status: DC
Start: 2017-08-25 — End: 2017-08-25

## 2017-08-25 MED ORDER — NYSTATIN-TRIAMCINOLONE 100000-0.1 UNIT/GM-% EX CREA
TOPICAL_CREAM | CUTANEOUS | Status: DC
Start: 2017-08-25 — End: 2017-08-25

## 2017-08-27 DIAGNOSIS — Z6829 Body mass index (BMI) 29.0-29.9, adult: Secondary | ICD-10-CM | POA: Diagnosis not present

## 2017-08-27 DIAGNOSIS — Z9884 Bariatric surgery status: Secondary | ICD-10-CM | POA: Diagnosis not present

## 2017-08-27 DIAGNOSIS — R197 Diarrhea, unspecified: Secondary | ICD-10-CM | POA: Diagnosis not present

## 2017-08-27 DIAGNOSIS — R102 Pelvic and perineal pain: Secondary | ICD-10-CM | POA: Diagnosis not present

## 2017-08-27 DIAGNOSIS — E860 Dehydration: Secondary | ICD-10-CM | POA: Diagnosis not present

## 2017-08-27 DIAGNOSIS — G8929 Other chronic pain: Secondary | ICD-10-CM | POA: Diagnosis present

## 2017-08-27 DIAGNOSIS — Z7901 Long term (current) use of anticoagulants: Secondary | ICD-10-CM | POA: Diagnosis not present

## 2017-08-27 DIAGNOSIS — D649 Anemia, unspecified: Secondary | ICD-10-CM | POA: Diagnosis not present

## 2017-08-27 DIAGNOSIS — Z431 Encounter for attention to gastrostomy: Secondary | ICD-10-CM | POA: Diagnosis not present

## 2017-08-27 DIAGNOSIS — Z9049 Acquired absence of other specified parts of digestive tract: Secondary | ICD-10-CM | POA: Diagnosis not present

## 2017-08-27 DIAGNOSIS — K912 Postsurgical malabsorption, not elsewhere classified: Secondary | ICD-10-CM | POA: Diagnosis not present

## 2017-08-27 DIAGNOSIS — K449 Diaphragmatic hernia without obstruction or gangrene: Secondary | ICD-10-CM | POA: Diagnosis present

## 2017-08-27 DIAGNOSIS — R109 Unspecified abdominal pain: Secondary | ICD-10-CM | POA: Diagnosis not present

## 2017-08-27 DIAGNOSIS — K219 Gastro-esophageal reflux disease without esophagitis: Secondary | ICD-10-CM | POA: Diagnosis present

## 2017-08-27 DIAGNOSIS — R079 Chest pain, unspecified: Secondary | ICD-10-CM | POA: Diagnosis not present

## 2017-08-27 DIAGNOSIS — K224 Dyskinesia of esophagus: Secondary | ICD-10-CM | POA: Diagnosis not present

## 2017-08-27 DIAGNOSIS — R112 Nausea with vomiting, unspecified: Secondary | ICD-10-CM | POA: Diagnosis not present

## 2017-08-27 DIAGNOSIS — E162 Hypoglycemia, unspecified: Secondary | ICD-10-CM | POA: Diagnosis present

## 2017-08-27 DIAGNOSIS — Z86718 Personal history of other venous thrombosis and embolism: Secondary | ICD-10-CM | POA: Diagnosis not present

## 2017-08-27 DIAGNOSIS — E43 Unspecified severe protein-calorie malnutrition: Secondary | ICD-10-CM | POA: Diagnosis present

## 2017-08-27 DIAGNOSIS — F329 Major depressive disorder, single episode, unspecified: Secondary | ICD-10-CM | POA: Diagnosis present

## 2017-08-27 DIAGNOSIS — F419 Anxiety disorder, unspecified: Secondary | ICD-10-CM | POA: Diagnosis present

## 2017-08-27 DIAGNOSIS — R1010 Upper abdominal pain, unspecified: Secondary | ICD-10-CM | POA: Diagnosis not present

## 2017-09-02 HISTORY — PX: JEJUNOSTOMY FEEDING TUBE: SUR737

## 2017-09-17 ENCOUNTER — Telehealth: Payer: Self-pay

## 2017-09-17 NOTE — Telephone Encounter (Signed)
Received a letter form [employer] stating that pt needs a generic for montelukast because she can not afford it. I [facility] local [facility] and the rx is only $2 I called pt and left a message with her trying to figure out what's going on.

## 2017-09-18 DIAGNOSIS — F329 Major depressive disorder, single episode, unspecified: Secondary | ICD-10-CM | POA: Diagnosis not present

## 2017-09-18 DIAGNOSIS — D649 Anemia, unspecified: Secondary | ICD-10-CM | POA: Diagnosis not present

## 2017-09-18 DIAGNOSIS — K449 Diaphragmatic hernia without obstruction or gangrene: Secondary | ICD-10-CM | POA: Diagnosis not present

## 2017-09-18 DIAGNOSIS — Z431 Encounter for attention to gastrostomy: Secondary | ICD-10-CM | POA: Diagnosis not present

## 2017-09-18 DIAGNOSIS — F419 Anxiety disorder, unspecified: Secondary | ICD-10-CM | POA: Diagnosis not present

## 2017-09-18 DIAGNOSIS — Z86718 Personal history of other venous thrombosis and embolism: Secondary | ICD-10-CM | POA: Diagnosis not present

## 2017-09-18 MED ORDER — POLYETHYLENE GLYCOL 3350 17 G PO PACK
17.00 | PACK | ORAL | Status: DC
Start: 2017-09-19 — End: 2017-09-18

## 2017-09-18 MED ORDER — GENERIC EXTERNAL MEDICATION
3.00 | Status: DC
Start: ? — End: 2017-09-18

## 2017-09-18 MED ORDER — DEXTROSE 50 % IV SOLN
12.50 | INTRAVENOUS | Status: DC
Start: ? — End: 2017-09-18

## 2017-09-18 MED ORDER — DEXTROSE 10 % IV SOLN
50.00 | INTRAVENOUS | Status: DC
Start: ? — End: 2017-09-18

## 2017-09-18 MED ORDER — ALBUTEROL SULFATE HFA 108 (90 BASE) MCG/ACT IN AERS
2.00 | INHALATION_SPRAY | RESPIRATORY_TRACT | Status: DC
Start: ? — End: 2017-09-18

## 2017-09-18 MED ORDER — SODIUM CHLORIDE 0.9 % IJ SOLN
10.00 | INTRAMUSCULAR | Status: DC
Start: 2017-09-19 — End: 2017-09-18

## 2017-09-18 MED ORDER — INSULIN REGULAR HUMAN 100 UNIT/ML IJ SOLN
0.00 | INTRAMUSCULAR | Status: DC
Start: 2017-09-18 — End: 2017-09-18

## 2017-09-18 MED ORDER — FLUTICASONE PROPIONATE HFA 44 MCG/ACT IN AERO
2.00 | INHALATION_SPRAY | RESPIRATORY_TRACT | Status: DC
Start: 2017-09-18 — End: 2017-09-18

## 2017-09-18 MED ORDER — CLONAZEPAM 0.5 MG PO TABS
.50 | ORAL_TABLET | ORAL | Status: DC
Start: ? — End: 2017-09-18

## 2017-09-18 MED ORDER — MAGNESIUM HYDROXIDE 400 MG/5ML PO SUSP
15.00 | ORAL | Status: DC
Start: 2017-09-18 — End: 2017-09-18

## 2017-09-18 MED ORDER — PROMETHAZINE HCL 12.5 MG RE SUPP
6.25 | RECTAL | Status: DC
Start: ? — End: 2017-09-18

## 2017-09-18 MED ORDER — LIDOCAINE 5 % EX PTCH
1.00 | MEDICATED_PATCH | CUTANEOUS | Status: DC
Start: 2017-09-19 — End: 2017-09-18

## 2017-09-18 MED ORDER — MONTELUKAST SODIUM 10 MG PO TABS
10.00 | ORAL_TABLET | ORAL | Status: DC
Start: 2017-09-19 — End: 2017-09-18

## 2017-09-18 MED ORDER — ONDANSETRON 4 MG PO TBDP
8.00 | ORAL_TABLET | ORAL | Status: DC
Start: ? — End: 2017-09-18

## 2017-09-18 MED ORDER — PROCHLORPERAZINE MALEATE 5 MG PO TABS
5.00 | ORAL_TABLET | ORAL | Status: DC
Start: ? — End: 2017-09-18

## 2017-09-18 MED ORDER — GLUCAGON HCL RDNA (DIAGNOSTIC) 1 MG IJ SOLR
1.00 | INTRAMUSCULAR | Status: DC
Start: ? — End: 2017-09-18

## 2017-09-18 MED ORDER — SODIUM CHLORIDE 0.9 % IJ SOLN
20.00 | INTRAMUSCULAR | Status: DC
Start: 2017-09-18 — End: 2017-09-18

## 2017-09-18 MED ORDER — QUETIAPINE FUMARATE 25 MG PO TABS
100.00 | ORAL_TABLET | ORAL | Status: DC
Start: ? — End: 2017-09-18

## 2017-09-18 MED ORDER — LINACLOTIDE 145 MCG PO CAPS
145.00 | ORAL_CAPSULE | ORAL | Status: DC
Start: 2017-09-19 — End: 2017-09-18

## 2017-09-18 MED ORDER — HYDROMORPHONE HCL 1 MG/ML IJ SOLN
.50 | INTRAMUSCULAR | Status: DC
Start: ? — End: 2017-09-18

## 2017-09-18 MED ORDER — SENNOSIDES-DOCUSATE SODIUM 8.6-50 MG PO TABS
2.00 | ORAL_TABLET | ORAL | Status: DC
Start: 2017-09-19 — End: 2017-09-18

## 2017-09-18 MED ORDER — ENOXAPARIN SODIUM 40 MG/0.4ML ~~LOC~~ SOLN
40.00 | SUBCUTANEOUS | Status: DC
Start: 2017-09-18 — End: 2017-09-18

## 2017-09-18 MED ORDER — LIDOCAINE HCL (PF) 1 % IJ SOLN
.50 | INTRAMUSCULAR | Status: DC
Start: ? — End: 2017-09-18

## 2017-09-18 MED ORDER — OXYCODONE HCL 5 MG PO TABS
10.00 | ORAL_TABLET | ORAL | Status: DC
Start: ? — End: 2017-09-18

## 2017-09-18 MED ORDER — FLUOXETINE HCL 20 MG PO CAPS
60.00 | ORAL_CAPSULE | ORAL | Status: DC
Start: 2017-09-19 — End: 2017-09-18

## 2017-09-18 MED ORDER — THIAMINE HCL 100 MG PO TABS
100.00 | ORAL_TABLET | ORAL | Status: DC
Start: 2017-09-19 — End: 2017-09-18

## 2017-09-18 MED ORDER — SCOPOLAMINE 1 MG/3DAYS TD PT72
1.00 | MEDICATED_PATCH | TRANSDERMAL | Status: DC
Start: 2017-09-20 — End: 2017-09-18

## 2017-09-18 MED ORDER — GABAPENTIN 300 MG PO CAPS
300.00 | ORAL_CAPSULE | ORAL | Status: DC
Start: 2017-09-18 — End: 2017-09-18

## 2017-09-18 MED ORDER — SODIUM CHLORIDE 0.9 % IJ SOLN
20.00 | INTRAMUSCULAR | Status: DC
Start: ? — End: 2017-09-18

## 2017-09-20 ENCOUNTER — Telehealth: Payer: Self-pay

## 2017-09-20 DIAGNOSIS — Z86718 Personal history of other venous thrombosis and embolism: Secondary | ICD-10-CM | POA: Diagnosis not present

## 2017-09-20 DIAGNOSIS — D649 Anemia, unspecified: Secondary | ICD-10-CM | POA: Diagnosis not present

## 2017-09-20 DIAGNOSIS — K449 Diaphragmatic hernia without obstruction or gangrene: Secondary | ICD-10-CM | POA: Diagnosis not present

## 2017-09-20 DIAGNOSIS — F419 Anxiety disorder, unspecified: Secondary | ICD-10-CM | POA: Diagnosis not present

## 2017-09-20 DIAGNOSIS — F329 Major depressive disorder, single episode, unspecified: Secondary | ICD-10-CM | POA: Diagnosis not present

## 2017-09-20 DIAGNOSIS — Z431 Encounter for attention to gastrostomy: Secondary | ICD-10-CM | POA: Diagnosis not present

## 2017-09-20 NOTE — Telephone Encounter (Signed)
[name] with [facility] called stating pt having adverse effects on formula, she can not tolerate and wants to change it to a mediation called vital.Please call her at [phone]

## 2017-09-20 NOTE — Telephone Encounter (Signed)
Spoke with RD at [facility]. Will switch to Vital feedings since she is not tolerating her current tube feedings after over a week. Will fax the orders to [place].

## 2017-09-24 DIAGNOSIS — Z86718 Personal history of other venous thrombosis and embolism: Secondary | ICD-10-CM | POA: Diagnosis not present

## 2017-09-24 DIAGNOSIS — D649 Anemia, unspecified: Secondary | ICD-10-CM | POA: Diagnosis not present

## 2017-09-24 DIAGNOSIS — F419 Anxiety disorder, unspecified: Secondary | ICD-10-CM | POA: Diagnosis not present

## 2017-09-24 DIAGNOSIS — Z431 Encounter for attention to gastrostomy: Secondary | ICD-10-CM | POA: Diagnosis not present

## 2017-09-24 DIAGNOSIS — F329 Major depressive disorder, single episode, unspecified: Secondary | ICD-10-CM | POA: Diagnosis not present

## 2017-09-24 DIAGNOSIS — K449 Diaphragmatic hernia without obstruction or gangrene: Secondary | ICD-10-CM | POA: Diagnosis not present

## 2017-09-25 DIAGNOSIS — K651 Peritoneal abscess: Secondary | ICD-10-CM | POA: Diagnosis present

## 2017-09-25 DIAGNOSIS — K219 Gastro-esophageal reflux disease without esophagitis: Secondary | ICD-10-CM | POA: Diagnosis present

## 2017-09-25 DIAGNOSIS — K912 Postsurgical malabsorption, not elsewhere classified: Secondary | ICD-10-CM | POA: Diagnosis present

## 2017-09-25 DIAGNOSIS — K5900 Constipation, unspecified: Secondary | ICD-10-CM | POA: Diagnosis present

## 2017-09-25 DIAGNOSIS — K9419 Other complications of enterostomy: Secondary | ICD-10-CM | POA: Diagnosis present

## 2017-09-25 DIAGNOSIS — Z9884 Bariatric surgery status: Secondary | ICD-10-CM | POA: Diagnosis not present

## 2017-09-25 DIAGNOSIS — K9413 Enterostomy malfunction: Secondary | ICD-10-CM | POA: Diagnosis not present

## 2017-09-25 DIAGNOSIS — G4733 Obstructive sleep apnea (adult) (pediatric): Secondary | ICD-10-CM | POA: Diagnosis present

## 2017-09-25 DIAGNOSIS — D509 Iron deficiency anemia, unspecified: Secondary | ICD-10-CM | POA: Diagnosis present

## 2017-09-25 DIAGNOSIS — R110 Nausea: Secondary | ICD-10-CM | POA: Diagnosis not present

## 2017-09-25 DIAGNOSIS — F419 Anxiety disorder, unspecified: Secondary | ICD-10-CM | POA: Diagnosis present

## 2017-09-25 DIAGNOSIS — R1110 Vomiting, unspecified: Secondary | ICD-10-CM | POA: Diagnosis not present

## 2017-09-25 DIAGNOSIS — Z9049 Acquired absence of other specified parts of digestive tract: Secondary | ICD-10-CM | POA: Diagnosis not present

## 2017-09-25 DIAGNOSIS — Z903 Acquired absence of stomach [part of]: Secondary | ICD-10-CM | POA: Diagnosis not present

## 2017-09-25 DIAGNOSIS — G8929 Other chronic pain: Secondary | ICD-10-CM | POA: Diagnosis present

## 2017-09-25 DIAGNOSIS — Z79899 Other long term (current) drug therapy: Secondary | ICD-10-CM | POA: Diagnosis not present

## 2017-09-25 DIAGNOSIS — E43 Unspecified severe protein-calorie malnutrition: Secondary | ICD-10-CM | POA: Diagnosis present

## 2017-09-25 DIAGNOSIS — R112 Nausea with vomiting, unspecified: Secondary | ICD-10-CM | POA: Diagnosis present

## 2017-09-25 DIAGNOSIS — Z7901 Long term (current) use of anticoagulants: Secondary | ICD-10-CM | POA: Diagnosis not present

## 2017-09-25 DIAGNOSIS — F329 Major depressive disorder, single episode, unspecified: Secondary | ICD-10-CM | POA: Diagnosis present

## 2017-09-25 DIAGNOSIS — E46 Unspecified protein-calorie malnutrition: Secondary | ICD-10-CM | POA: Diagnosis not present

## 2017-09-25 DIAGNOSIS — R197 Diarrhea, unspecified: Secondary | ICD-10-CM | POA: Diagnosis not present

## 2017-09-25 DIAGNOSIS — Z86718 Personal history of other venous thrombosis and embolism: Secondary | ICD-10-CM | POA: Diagnosis not present

## 2017-10-04 MED ORDER — DEXTROSE 10 % IV SOLN
50.00 | INTRAVENOUS | Status: DC
Start: ? — End: 2017-10-04

## 2017-10-04 MED ORDER — GABAPENTIN 300 MG/6ML PO SOLN
300.00 | ORAL | Status: DC
Start: 2017-10-04 — End: 2017-10-04

## 2017-10-04 MED ORDER — SODIUM CHLORIDE 0.9 % IJ SOLN
10.00 | INTRAMUSCULAR | Status: DC
Start: 2017-10-04 — End: 2017-10-04

## 2017-10-04 MED ORDER — KCL IN DEXTROSE-NACL 20-5-0.9 MEQ/L-%-% IV SOLN
INTRAVENOUS | Status: DC
Start: ? — End: 2017-10-04

## 2017-10-04 MED ORDER — SODIUM CHLORIDE 0.9 % IJ SOLN
10.00 | INTRAMUSCULAR | Status: DC
Start: ? — End: 2017-10-04

## 2017-10-04 MED ORDER — POLYETHYLENE GLYCOL 3350 17 G PO PACK
17.00 | PACK | ORAL | Status: DC
Start: 2017-10-05 — End: 2017-10-04

## 2017-10-04 MED ORDER — FLUOXETINE HCL 20 MG/5ML PO SOLN
60.00 | ORAL | Status: DC
Start: 2017-10-05 — End: 2017-10-04

## 2017-10-04 MED ORDER — OXYCODONE HCL 5 MG PO TABS
5.00 | ORAL_TABLET | ORAL | Status: DC
Start: ? — End: 2017-10-04

## 2017-10-04 MED ORDER — ENOXAPARIN SODIUM 40 MG/0.4ML ~~LOC~~ SOLN
40.00 | SUBCUTANEOUS | Status: DC
Start: 2017-10-04 — End: 2017-10-04

## 2017-10-04 MED ORDER — FAMOTIDINE PREMIXED 20-0.9 MG/50ML-% IV SOLN
20.00 | INTRAVENOUS | Status: DC
Start: 2017-10-05 — End: 2017-10-04

## 2017-10-04 MED ORDER — AMOXICILLIN-POT CLAVULANATE 400-57 MG/5ML PO SUSR
800.00 | ORAL | Status: DC
Start: 2017-10-04 — End: 2017-10-04

## 2017-10-04 MED ORDER — MONTELUKAST SODIUM 10 MG PO TABS
10.00 | ORAL_TABLET | ORAL | Status: DC
Start: 2017-10-05 — End: 2017-10-04

## 2017-10-04 MED ORDER — MAGNESIUM HYDROXIDE 400 MG/5ML PO SUSP
15.00 | ORAL | Status: DC
Start: 2017-10-04 — End: 2017-10-04

## 2017-10-04 MED ORDER — LIDOCAINE HCL (PF) 1 % IJ SOLN
.50 | INTRAMUSCULAR | Status: DC
Start: ? — End: 2017-10-04

## 2017-10-04 MED ORDER — LINACLOTIDE 145 MCG PO CAPS
145.00 | ORAL_CAPSULE | ORAL | Status: DC
Start: 2017-10-05 — End: 2017-10-04

## 2017-10-04 MED ORDER — PANTOPRAZOLE SODIUM 40 MG IV SOLR
40.00 | INTRAVENOUS | Status: DC
Start: 2017-10-05 — End: 2017-10-04

## 2017-10-04 MED ORDER — GENERIC EXTERNAL MEDICATION
1.00 | Status: DC
Start: 2017-10-04 — End: 2017-10-04

## 2017-10-04 MED ORDER — ONDANSETRON HCL 4 MG/2ML IJ SOLN
8.00 | INTRAMUSCULAR | Status: DC
Start: 2017-10-04 — End: 2017-10-04

## 2017-10-04 MED ORDER — PROMETHAZINE HCL 25 MG/ML IJ SOLN
6.25 | INTRAMUSCULAR | Status: DC
Start: ? — End: 2017-10-04

## 2017-10-04 MED ORDER — HYDROMORPHONE HCL 1 MG/ML IJ SOLN
0.50 | INTRAMUSCULAR | Status: DC
Start: ? — End: 2017-10-04

## 2017-10-04 MED ORDER — GENERIC EXTERNAL MEDICATION
3.00 | Status: DC
Start: ? — End: 2017-10-04

## 2017-10-04 MED ORDER — SCOPOLAMINE 1 MG/3DAYS TD PT72
1.00 | MEDICATED_PATCH | TRANSDERMAL | Status: DC
Start: 2017-10-04 — End: 2017-10-04

## 2017-10-04 MED ORDER — QUETIAPINE FUMARATE 25 MG PO TABS
100.00 | ORAL_TABLET | ORAL | Status: DC
Start: 2017-10-04 — End: 2017-10-04

## 2017-10-04 MED ORDER — GENERIC EXTERNAL MEDICATION
5.00 | Status: DC
Start: ? — End: 2017-10-04

## 2017-10-05 DIAGNOSIS — Z86718 Personal history of other venous thrombosis and embolism: Secondary | ICD-10-CM | POA: Diagnosis not present

## 2017-10-05 DIAGNOSIS — D649 Anemia, unspecified: Secondary | ICD-10-CM | POA: Diagnosis not present

## 2017-10-05 DIAGNOSIS — K449 Diaphragmatic hernia without obstruction or gangrene: Secondary | ICD-10-CM | POA: Diagnosis not present

## 2017-10-05 DIAGNOSIS — F329 Major depressive disorder, single episode, unspecified: Secondary | ICD-10-CM | POA: Diagnosis not present

## 2017-10-05 DIAGNOSIS — Z431 Encounter for attention to gastrostomy: Secondary | ICD-10-CM | POA: Diagnosis not present

## 2017-10-05 DIAGNOSIS — F419 Anxiety disorder, unspecified: Secondary | ICD-10-CM | POA: Diagnosis not present

## 2017-10-06 IMAGING — DX DG ABDOMEN 1V
1 series · 1 of 1 positions shown · non-contrast
Comparison: None.

CLINICAL DATA: Constipation

EXAM:
ABDOMEN - 1 VIEW

[abdomen kub]
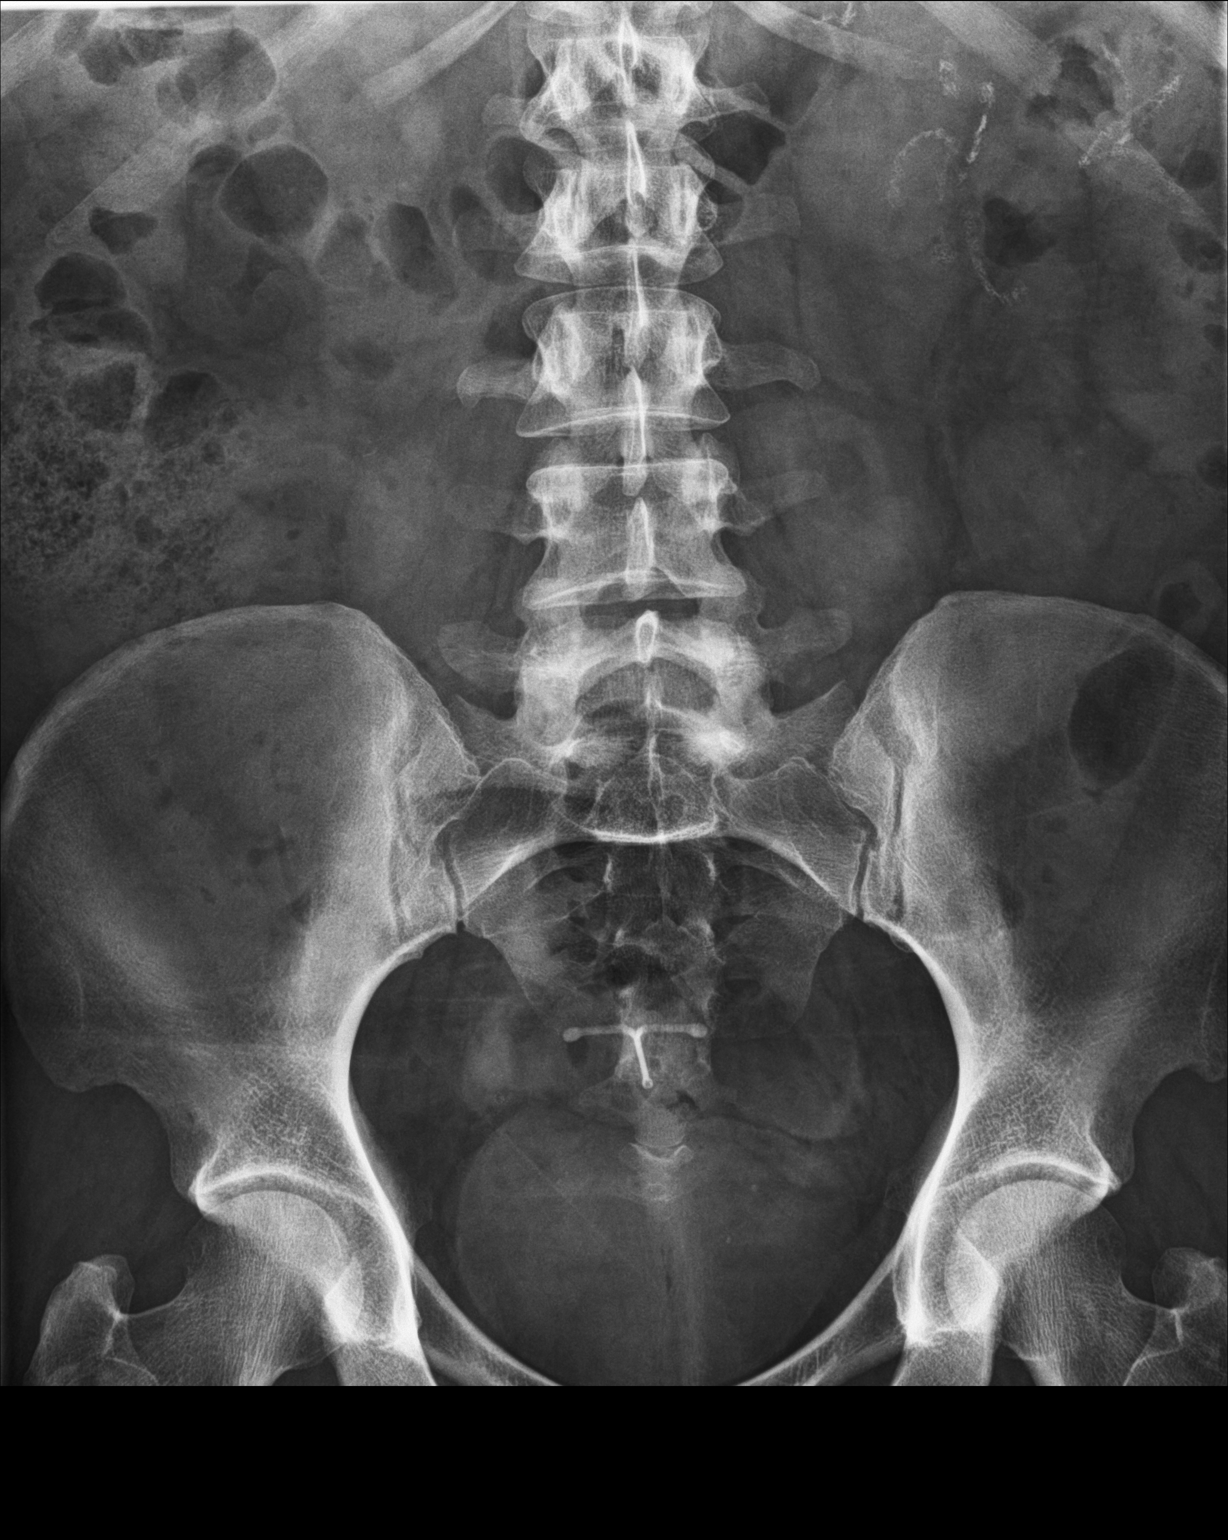

[1 of 1 positions shown; findings below may reference images not displayed]

FINDINGS: Scattered large and small bowel gas is noted. Postsurgical changes
are noted in the left upper abdomen. An IUD is noted within the
uterus. No bony abnormality is seen. No free air is noted.
IMPRESSION: No acute abnormality noted.

## 2017-10-09 DIAGNOSIS — Z9884 Bariatric surgery status: Secondary | ICD-10-CM | POA: Diagnosis not present

## 2017-10-09 DIAGNOSIS — R110 Nausea: Secondary | ICD-10-CM | POA: Diagnosis not present

## 2017-10-09 DIAGNOSIS — Z9889 Other specified postprocedural states: Secondary | ICD-10-CM | POA: Diagnosis not present

## 2017-10-09 DIAGNOSIS — Z4889 Encounter for other specified surgical aftercare: Secondary | ICD-10-CM | POA: Diagnosis not present

## 2017-10-09 DIAGNOSIS — Z8659 Personal history of other mental and behavioral disorders: Secondary | ICD-10-CM | POA: Diagnosis not present

## 2017-10-09 DIAGNOSIS — K912 Postsurgical malabsorption, not elsewhere classified: Secondary | ICD-10-CM | POA: Diagnosis not present

## 2017-10-10 DIAGNOSIS — K449 Diaphragmatic hernia without obstruction or gangrene: Secondary | ICD-10-CM | POA: Diagnosis not present

## 2017-10-10 DIAGNOSIS — Z431 Encounter for attention to gastrostomy: Secondary | ICD-10-CM | POA: Diagnosis not present

## 2017-10-10 DIAGNOSIS — F419 Anxiety disorder, unspecified: Secondary | ICD-10-CM | POA: Diagnosis not present

## 2017-10-10 DIAGNOSIS — D649 Anemia, unspecified: Secondary | ICD-10-CM | POA: Diagnosis not present

## 2017-10-10 DIAGNOSIS — F329 Major depressive disorder, single episode, unspecified: Secondary | ICD-10-CM | POA: Diagnosis not present

## 2017-10-10 DIAGNOSIS — Z86718 Personal history of other venous thrombosis and embolism: Secondary | ICD-10-CM | POA: Diagnosis not present

## 2017-10-15 ENCOUNTER — Other Ambulatory Visit: Payer: Medicare Other | Admitting: Pediatrics

## 2017-10-16 DIAGNOSIS — K9413 Enterostomy malfunction: Secondary | ICD-10-CM | POA: Diagnosis not present

## 2017-10-16 DIAGNOSIS — Z934 Other artificial openings of gastrointestinal tract status: Secondary | ICD-10-CM | POA: Diagnosis not present

## 2017-10-16 DIAGNOSIS — T85528A Displacement of other gastrointestinal prosthetic devices, implants and grafts, initial encounter: Secondary | ICD-10-CM | POA: Diagnosis not present

## 2017-10-17 DIAGNOSIS — F329 Major depressive disorder, single episode, unspecified: Secondary | ICD-10-CM | POA: Diagnosis not present

## 2017-10-17 DIAGNOSIS — D649 Anemia, unspecified: Secondary | ICD-10-CM | POA: Diagnosis not present

## 2017-10-17 DIAGNOSIS — Z86718 Personal history of other venous thrombosis and embolism: Secondary | ICD-10-CM | POA: Diagnosis not present

## 2017-10-17 DIAGNOSIS — F419 Anxiety disorder, unspecified: Secondary | ICD-10-CM | POA: Diagnosis not present

## 2017-10-17 DIAGNOSIS — Z431 Encounter for attention to gastrostomy: Secondary | ICD-10-CM | POA: Diagnosis not present

## 2017-10-17 DIAGNOSIS — K449 Diaphragmatic hernia without obstruction or gangrene: Secondary | ICD-10-CM | POA: Diagnosis not present

## 2017-10-23 DIAGNOSIS — K912 Postsurgical malabsorption, not elsewhere classified: Secondary | ICD-10-CM | POA: Diagnosis not present

## 2017-10-23 DIAGNOSIS — E43 Unspecified severe protein-calorie malnutrition: Secondary | ICD-10-CM | POA: Diagnosis not present

## 2017-10-23 DIAGNOSIS — Z934 Other artificial openings of gastrointestinal tract status: Secondary | ICD-10-CM | POA: Diagnosis not present

## 2017-10-25 DIAGNOSIS — K449 Diaphragmatic hernia without obstruction or gangrene: Secondary | ICD-10-CM | POA: Diagnosis not present

## 2017-10-25 DIAGNOSIS — Z431 Encounter for attention to gastrostomy: Secondary | ICD-10-CM | POA: Diagnosis not present

## 2017-10-25 DIAGNOSIS — F419 Anxiety disorder, unspecified: Secondary | ICD-10-CM | POA: Diagnosis not present

## 2017-10-25 DIAGNOSIS — D649 Anemia, unspecified: Secondary | ICD-10-CM | POA: Diagnosis not present

## 2017-10-25 DIAGNOSIS — Z86718 Personal history of other venous thrombosis and embolism: Secondary | ICD-10-CM | POA: Diagnosis not present

## 2017-10-25 DIAGNOSIS — F329 Major depressive disorder, single episode, unspecified: Secondary | ICD-10-CM | POA: Diagnosis not present

## 2017-10-26 DIAGNOSIS — R633 Feeding difficulties: Secondary | ICD-10-CM | POA: Diagnosis not present

## 2017-10-26 DIAGNOSIS — K9413 Enterostomy malfunction: Secondary | ICD-10-CM | POA: Diagnosis not present

## 2017-10-26 DIAGNOSIS — Z86718 Personal history of other venous thrombosis and embolism: Secondary | ICD-10-CM | POA: Diagnosis not present

## 2017-10-26 DIAGNOSIS — K9423 Gastrostomy malfunction: Secondary | ICD-10-CM | POA: Diagnosis not present

## 2017-10-26 DIAGNOSIS — E440 Moderate protein-calorie malnutrition: Secondary | ICD-10-CM | POA: Diagnosis not present

## 2017-10-26 DIAGNOSIS — Z9049 Acquired absence of other specified parts of digestive tract: Secondary | ICD-10-CM | POA: Diagnosis not present

## 2017-10-26 DIAGNOSIS — Z4682 Encounter for fitting and adjustment of non-vascular catheter: Secondary | ICD-10-CM | POA: Diagnosis not present

## 2017-10-26 DIAGNOSIS — Z6826 Body mass index (BMI) 26.0-26.9, adult: Secondary | ICD-10-CM | POA: Diagnosis not present

## 2017-10-26 DIAGNOSIS — Z043 Encounter for examination and observation following other accident: Secondary | ICD-10-CM | POA: Diagnosis not present

## 2017-10-26 DIAGNOSIS — W19XXXA Unspecified fall, initial encounter: Secondary | ICD-10-CM | POA: Diagnosis not present

## 2017-10-26 DIAGNOSIS — K449 Diaphragmatic hernia without obstruction or gangrene: Secondary | ICD-10-CM | POA: Diagnosis not present

## 2017-10-26 DIAGNOSIS — R1084 Generalized abdominal pain: Secondary | ICD-10-CM | POA: Diagnosis not present

## 2017-10-26 DIAGNOSIS — R110 Nausea: Secondary | ICD-10-CM | POA: Diagnosis not present

## 2017-10-26 DIAGNOSIS — Z9181 History of falling: Secondary | ICD-10-CM | POA: Diagnosis not present

## 2017-10-27 DIAGNOSIS — R633 Feeding difficulties: Secondary | ICD-10-CM | POA: Diagnosis not present

## 2017-10-28 DIAGNOSIS — Z434 Encounter for attention to other artificial openings of digestive tract: Secondary | ICD-10-CM | POA: Diagnosis not present

## 2017-10-28 DIAGNOSIS — B373 Candidiasis of vulva and vagina: Secondary | ICD-10-CM | POA: Diagnosis not present

## 2017-10-28 DIAGNOSIS — Z9049 Acquired absence of other specified parts of digestive tract: Secondary | ICD-10-CM | POA: Diagnosis not present

## 2017-10-28 DIAGNOSIS — K9413 Enterostomy malfunction: Secondary | ICD-10-CM | POA: Diagnosis present

## 2017-10-28 DIAGNOSIS — R633 Feeding difficulties: Secondary | ICD-10-CM | POA: Diagnosis not present

## 2017-10-28 DIAGNOSIS — N760 Acute vaginitis: Secondary | ICD-10-CM | POA: Diagnosis not present

## 2017-10-28 DIAGNOSIS — E440 Moderate protein-calorie malnutrition: Secondary | ICD-10-CM | POA: Diagnosis present

## 2017-10-28 DIAGNOSIS — Z6826 Body mass index (BMI) 26.0-26.9, adult: Secondary | ICD-10-CM | POA: Diagnosis not present

## 2017-10-28 DIAGNOSIS — F419 Anxiety disorder, unspecified: Secondary | ICD-10-CM | POA: Diagnosis not present

## 2017-10-28 DIAGNOSIS — Z9181 History of falling: Secondary | ICD-10-CM | POA: Diagnosis not present

## 2017-10-28 DIAGNOSIS — K219 Gastro-esophageal reflux disease without esophagitis: Secondary | ICD-10-CM | POA: Diagnosis present

## 2017-10-28 DIAGNOSIS — F329 Major depressive disorder, single episode, unspecified: Secondary | ICD-10-CM | POA: Diagnosis not present

## 2017-10-28 DIAGNOSIS — R110 Nausea: Secondary | ICD-10-CM | POA: Diagnosis not present

## 2017-10-28 DIAGNOSIS — K449 Diaphragmatic hernia without obstruction or gangrene: Secondary | ICD-10-CM | POA: Diagnosis not present

## 2017-10-28 DIAGNOSIS — Z431 Encounter for attention to gastrostomy: Secondary | ICD-10-CM | POA: Diagnosis not present

## 2017-10-28 DIAGNOSIS — D649 Anemia, unspecified: Secondary | ICD-10-CM | POA: Diagnosis not present

## 2017-10-28 DIAGNOSIS — Z86718 Personal history of other venous thrombosis and embolism: Secondary | ICD-10-CM | POA: Diagnosis not present

## 2017-10-29 DIAGNOSIS — F419 Anxiety disorder, unspecified: Secondary | ICD-10-CM | POA: Diagnosis not present

## 2017-10-29 DIAGNOSIS — Z86718 Personal history of other venous thrombosis and embolism: Secondary | ICD-10-CM | POA: Diagnosis not present

## 2017-10-29 DIAGNOSIS — Z431 Encounter for attention to gastrostomy: Secondary | ICD-10-CM | POA: Diagnosis not present

## 2017-10-29 DIAGNOSIS — D649 Anemia, unspecified: Secondary | ICD-10-CM | POA: Diagnosis not present

## 2017-10-29 DIAGNOSIS — K449 Diaphragmatic hernia without obstruction or gangrene: Secondary | ICD-10-CM | POA: Diagnosis not present

## 2017-10-29 DIAGNOSIS — F329 Major depressive disorder, single episode, unspecified: Secondary | ICD-10-CM | POA: Diagnosis not present

## 2017-10-31 ENCOUNTER — Encounter (HOSPITAL_COMMUNITY): Payer: Self-pay

## 2017-10-31 DIAGNOSIS — Z434 Encounter for attention to other artificial openings of digestive tract: Secondary | ICD-10-CM | POA: Diagnosis not present

## 2017-11-04 MED ORDER — FAMOTIDINE 20 MG PO TABS
20.00 | ORAL_TABLET | ORAL | Status: DC
Start: 2017-11-04 — End: 2017-11-04

## 2017-11-04 MED ORDER — MONTELUKAST SODIUM 10 MG PO TABS
10.00 | ORAL_TABLET | ORAL | Status: DC
Start: 2017-11-04 — End: 2017-11-04

## 2017-11-04 MED ORDER — ACETAMINOPHEN 160 MG/5ML PO SUSP
975.00 | ORAL | Status: DC
Start: 2017-11-04 — End: 2017-11-04

## 2017-11-04 MED ORDER — DIPHENHYDRAMINE HCL 25 MG PO CAPS
25.00 | ORAL_CAPSULE | ORAL | Status: DC
Start: ? — End: 2017-11-04

## 2017-11-04 MED ORDER — SODIUM CHLORIDE 0.9 % IJ SOLN
10.00 | INTRAMUSCULAR | Status: DC
Start: ? — End: 2017-11-04

## 2017-11-04 MED ORDER — PANTOPRAZOLE SODIUM 40 MG PO TBEC
40.00 | DELAYED_RELEASE_TABLET | ORAL | Status: DC
Start: 2017-11-05 — End: 2017-11-04

## 2017-11-04 MED ORDER — GABAPENTIN 300 MG/6ML PO SOLN
300.00 | ORAL | Status: DC
Start: 2017-11-04 — End: 2017-11-04

## 2017-11-04 MED ORDER — HYDROMORPHONE HCL 1 MG/ML IJ SOLN
.50 | INTRAMUSCULAR | Status: DC
Start: ? — End: 2017-11-04

## 2017-11-04 MED ORDER — HEPARIN SODIUM (PORCINE) 10000 UNIT/ML IJ SOLN
5000.00 | INTRAMUSCULAR | Status: DC
Start: 2017-11-04 — End: 2017-11-04

## 2017-11-04 MED ORDER — SCOPOLAMINE 1 MG/3DAYS TD PT72
1.00 | MEDICATED_PATCH | TRANSDERMAL | Status: DC
Start: 2017-11-04 — End: 2017-11-04

## 2017-11-04 MED ORDER — GENERIC EXTERNAL MEDICATION
5.00 | Status: DC
Start: ? — End: 2017-11-04

## 2017-11-04 MED ORDER — DEXTROSE 10 % IV SOLN
50.00 | INTRAVENOUS | Status: DC
Start: ? — End: 2017-11-04

## 2017-11-04 MED ORDER — NYSTATIN 100000 UNIT/ML MT SUSP
5.00 | OROMUCOSAL | Status: DC
Start: 2017-11-04 — End: 2017-11-04

## 2017-11-04 MED ORDER — KCL IN DEXTROSE-NACL 20-5-0.45 MEQ/L-%-% IV SOLN
INTRAVENOUS | Status: DC
Start: ? — End: 2017-11-04

## 2017-11-04 MED ORDER — THERA PO TABS
1.00 | ORAL_TABLET | ORAL | Status: DC
Start: 2017-11-05 — End: 2017-11-04

## 2017-11-04 MED ORDER — FLUOXETINE HCL 20 MG/5ML PO SOLN
60.00 | ORAL | Status: DC
Start: 2017-11-05 — End: 2017-11-04

## 2017-11-04 MED ORDER — ONDANSETRON HCL 4 MG/2ML IJ SOLN
4.00 | INTRAMUSCULAR | Status: DC
Start: ? — End: 2017-11-04

## 2017-11-04 MED ORDER — CLONAZEPAM 0.5 MG PO TABS
0.50 | ORAL_TABLET | ORAL | Status: DC
Start: ? — End: 2017-11-04

## 2017-11-04 MED ORDER — QUETIAPINE FUMARATE 25 MG PO TABS
100.00 | ORAL_TABLET | ORAL | Status: DC
Start: 2017-11-04 — End: 2017-11-04

## 2017-11-04 MED ORDER — ACETAMINOPHEN 160 MG/5ML PO SUSP
650.00 | ORAL | Status: DC
Start: ? — End: 2017-11-04

## 2017-11-04 MED ORDER — OXYCODONE HCL 5 MG/5ML PO SOLN
5.00 | ORAL | Status: DC
Start: ? — End: 2017-11-04

## 2017-11-04 MED ORDER — SENNOSIDES-DOCUSATE SODIUM 8.6-50 MG PO TABS
2.00 | ORAL_TABLET | ORAL | Status: DC
Start: 2017-11-04 — End: 2017-11-04

## 2017-11-04 MED ORDER — SODIUM CHLORIDE 0.9 % IJ SOLN
10.00 | INTRAMUSCULAR | Status: DC
Start: 2017-11-04 — End: 2017-11-04

## 2017-11-04 MED ORDER — PROMETHAZINE HCL 25 MG/ML IJ SOLN
6.25 | INTRAMUSCULAR | Status: DC
Start: ? — End: 2017-11-04

## 2017-11-04 MED ORDER — LINACLOTIDE 145 MCG PO CAPS
145.00 | ORAL_CAPSULE | ORAL | Status: DC
Start: 2017-11-05 — End: 2017-11-04

## 2017-11-04 MED ORDER — GENERIC EXTERNAL MEDICATION
3.00 | Status: DC
Start: ? — End: 2017-11-04

## 2017-11-04 MED ORDER — CYCLOSPORINE 0.05 % OP EMUL
1.00 | OPHTHALMIC | Status: DC
Start: 2017-11-04 — End: 2017-11-04

## 2017-11-04 MED ORDER — FLUTICASONE PROPIONATE 50 MCG/ACT NA SUSP
1.00 | NASAL | Status: DC
Start: 2017-11-05 — End: 2017-11-04

## 2017-11-05 DIAGNOSIS — Z86718 Personal history of other venous thrombosis and embolism: Secondary | ICD-10-CM | POA: Diagnosis not present

## 2017-11-05 DIAGNOSIS — K449 Diaphragmatic hernia without obstruction or gangrene: Secondary | ICD-10-CM | POA: Diagnosis not present

## 2017-11-05 DIAGNOSIS — F419 Anxiety disorder, unspecified: Secondary | ICD-10-CM | POA: Diagnosis not present

## 2017-11-05 DIAGNOSIS — Z79899 Other long term (current) drug therapy: Secondary | ICD-10-CM | POA: Diagnosis not present

## 2017-11-05 DIAGNOSIS — M792 Neuralgia and neuritis, unspecified: Secondary | ICD-10-CM | POA: Diagnosis not present

## 2017-11-05 DIAGNOSIS — Z431 Encounter for attention to gastrostomy: Secondary | ICD-10-CM | POA: Diagnosis not present

## 2017-11-05 DIAGNOSIS — D649 Anemia, unspecified: Secondary | ICD-10-CM | POA: Diagnosis not present

## 2017-11-05 DIAGNOSIS — R1013 Epigastric pain: Secondary | ICD-10-CM | POA: Diagnosis not present

## 2017-11-05 DIAGNOSIS — F329 Major depressive disorder, single episode, unspecified: Secondary | ICD-10-CM | POA: Diagnosis not present

## 2017-11-05 MED ORDER — KCL IN DEXTROSE-NACL 20-5-0.45 MEQ/L-%-% IV SOLN
INTRAVENOUS | Status: DC
Start: ? — End: 2017-11-05

## 2017-11-05 MED ORDER — ACETAMINOPHEN 160 MG/5ML PO SUSP
650.00 | ORAL | Status: DC
Start: ? — End: 2017-11-05

## 2017-11-05 MED ORDER — ONDANSETRON HCL 4 MG/2ML IJ SOLN
4.00 | INTRAMUSCULAR | Status: DC
Start: ? — End: 2017-11-05

## 2017-11-06 ENCOUNTER — Other Ambulatory Visit: Payer: Self-pay | Admitting: Nurse Practitioner

## 2017-11-06 DIAGNOSIS — T85528D Displacement of other gastrointestinal prosthetic devices, implants and grafts, subsequent encounter: Secondary | ICD-10-CM | POA: Diagnosis not present

## 2017-11-06 DIAGNOSIS — Z934 Other artificial openings of gastrointestinal tract status: Secondary | ICD-10-CM | POA: Diagnosis not present

## 2017-11-06 DIAGNOSIS — K9413 Enterostomy malfunction: Secondary | ICD-10-CM | POA: Diagnosis not present

## 2017-11-06 MED ORDER — QUETIAPINE FUMARATE 100 MG PO TABS: ORAL_TABLET | ORAL | 0 refills | Status: DC

## 2017-11-06 NOTE — Progress Notes (Signed)
Call from patient. Off all Dilaudid and Oxycodone for the past 2 days. Also off Klonopin.  Requesting increase in Seroquel for sleep. Has taken 200 mg without difficulty in the past. Has an appointment with new psychiatrist in 2 weeks. Increase from 100 to 200 mg. Go back to previous dose if any problems.

## 2017-11-08 DIAGNOSIS — K9413 Enterostomy malfunction: Secondary | ICD-10-CM | POA: Diagnosis not present

## 2017-11-08 DIAGNOSIS — Z4659 Encounter for fitting and adjustment of other gastrointestinal appliance and device: Secondary | ICD-10-CM | POA: Diagnosis not present

## 2017-11-08 DIAGNOSIS — Z79899 Other long term (current) drug therapy: Secondary | ICD-10-CM | POA: Diagnosis not present

## 2017-11-08 DIAGNOSIS — Z9884 Bariatric surgery status: Secondary | ICD-10-CM | POA: Diagnosis not present

## 2017-11-09 ENCOUNTER — Other Ambulatory Visit: Payer: Self-pay

## 2017-11-09 ENCOUNTER — Encounter (HOSPITAL_COMMUNITY): Payer: Self-pay | Admitting: Emergency Medicine

## 2017-11-09 ENCOUNTER — Emergency Department (HOSPITAL_COMMUNITY)
Admission: EM | Admit: 2017-11-09 | Discharge: 2017-11-09 | Payer: Medicare Other | Attending: Emergency Medicine | Admitting: Emergency Medicine

## 2017-11-09 DIAGNOSIS — K912 Postsurgical malabsorption, not elsewhere classified: Secondary | ICD-10-CM | POA: Diagnosis not present

## 2017-11-09 DIAGNOSIS — Z9884 Bariatric surgery status: Secondary | ICD-10-CM | POA: Diagnosis not present

## 2017-11-09 DIAGNOSIS — R079 Chest pain, unspecified: Secondary | ICD-10-CM | POA: Diagnosis not present

## 2017-11-09 DIAGNOSIS — K9413 Enterostomy malfunction: Secondary | ICD-10-CM | POA: Diagnosis not present

## 2017-11-09 DIAGNOSIS — R0602 Shortness of breath: Secondary | ICD-10-CM | POA: Diagnosis present

## 2017-11-09 DIAGNOSIS — I10 Essential (primary) hypertension: Secondary | ICD-10-CM | POA: Insufficient documentation

## 2017-11-09 DIAGNOSIS — E876 Hypokalemia: Secondary | ICD-10-CM | POA: Insufficient documentation

## 2017-11-09 DIAGNOSIS — R1084 Generalized abdominal pain: Secondary | ICD-10-CM | POA: Diagnosis not present

## 2017-11-09 DIAGNOSIS — E860 Dehydration: Secondary | ICD-10-CM

## 2017-11-09 DIAGNOSIS — K9589 Other complications of other bariatric procedure: Secondary | ICD-10-CM | POA: Diagnosis not present

## 2017-11-09 DIAGNOSIS — Z91040 Latex allergy status: Secondary | ICD-10-CM | POA: Diagnosis not present

## 2017-11-09 DIAGNOSIS — F332 Major depressive disorder, recurrent severe without psychotic features: Secondary | ICD-10-CM | POA: Diagnosis not present

## 2017-11-09 DIAGNOSIS — E43 Unspecified severe protein-calorie malnutrition: Secondary | ICD-10-CM | POA: Diagnosis not present

## 2017-11-09 DIAGNOSIS — T7840XA Allergy, unspecified, initial encounter: Secondary | ICD-10-CM | POA: Diagnosis not present

## 2017-11-09 DIAGNOSIS — R45851 Suicidal ideations: Secondary | ICD-10-CM | POA: Diagnosis not present

## 2017-11-09 DIAGNOSIS — Z931 Gastrostomy status: Secondary | ICD-10-CM | POA: Diagnosis not present

## 2017-11-09 DIAGNOSIS — D72829 Elevated white blood cell count, unspecified: Secondary | ICD-10-CM | POA: Diagnosis not present

## 2017-11-09 DIAGNOSIS — T85528A Displacement of other gastrointestinal prosthetic devices, implants and grafts, initial encounter: Secondary | ICD-10-CM | POA: Diagnosis not present

## 2017-11-09 DIAGNOSIS — Z79899 Other long term (current) drug therapy: Secondary | ICD-10-CM | POA: Insufficient documentation

## 2017-11-09 DIAGNOSIS — R112 Nausea with vomiting, unspecified: Secondary | ICD-10-CM | POA: Diagnosis not present

## 2017-11-09 DIAGNOSIS — R197 Diarrhea, unspecified: Secondary | ICD-10-CM | POA: Diagnosis not present

## 2017-11-09 DIAGNOSIS — Z431 Encounter for attention to gastrostomy: Secondary | ICD-10-CM | POA: Diagnosis not present

## 2017-11-09 DIAGNOSIS — R109 Unspecified abdominal pain: Secondary | ICD-10-CM | POA: Diagnosis not present

## 2017-11-09 LAB — CBC WITH DIFFERENTIAL/PLATELET
Basophils Absolute: 0 10*3/uL (ref 0.0–0.1)
Basophils Relative: 0 %
EOS PCT: 0 %
Eosinophils Absolute: 0 10*3/uL (ref 0.0–0.7)
HEMATOCRIT: 39.1 % (ref 36.0–46.0)
HEMOGLOBIN: 12.9 g/dL (ref 12.0–15.0)
LYMPHS ABS: 2.3 10*3/uL (ref 0.7–4.0)
LYMPHS PCT: 16 %
MCH: 29.8 pg (ref 26.0–34.0)
MCHC: 33 g/dL (ref 30.0–36.0)
MCV: 90.3 fL (ref 78.0–100.0)
Monocytes Absolute: 0.8 10*3/uL (ref 0.1–1.0)
Monocytes Relative: 6 %
NEUTROS PCT: 78 %
Neutro Abs: 11.1 10*3/uL — ABNORMAL HIGH (ref 1.7–7.7)
Platelets: 382 10*3/uL (ref 150–400)
RBC: 4.33 MIL/uL (ref 3.87–5.11)
RDW: 14.7 % (ref 11.5–15.5)
WBC: 14.1 10*3/uL — ABNORMAL HIGH (ref 4.0–10.5)

## 2017-11-09 LAB — COMPREHENSIVE METABOLIC PANEL
ALK PHOS: 106 U/L (ref 38–126)
ALT: 50 U/L (ref 14–54)
AST: 24 U/L (ref 15–41)
Albumin: 4 g/dL (ref 3.5–5.0)
Anion gap: 13 (ref 5–15)
BUN: 7 mg/dL (ref 6–20)
CALCIUM: 9.4 mg/dL (ref 8.9–10.3)
CO2: 20 mmol/L — ABNORMAL LOW (ref 22–32)
CREATININE: 0.73 mg/dL (ref 0.44–1.00)
Chloride: 106 mmol/L (ref 101–111)
GFR calc Af Amer: >60 mL/min (ref >60)
Glucose, Bld: 121 mg/dL — ABNORMAL HIGH (ref 65–99)
Potassium: 2.9 mmol/L — ABNORMAL LOW (ref 3.5–5.1)
Sodium: 139 mmol/L (ref 135–145)
Total Bilirubin: 1.1 mg/dL (ref 0.3–1.2)
Total Protein: 7.4 g/dL (ref 6.5–8.1)

## 2017-11-09 LAB — LIPASE, BLOOD: LIPASE: 38 U/L (ref 11–51)

## 2017-11-09 MED ORDER — SODIUM CHLORIDE 0.9 % IV BOLUS
1000.0000 mL | Freq: Once | INTRAVENOUS | Status: AC
  Administered 2017-11-09: 1000 mL via INTRAVENOUS

## 2017-11-09 MED ORDER — PROMETHAZINE HCL 25 MG/ML IJ SOLN
12.5000 mg | Freq: Once | INTRAMUSCULAR | Status: AC
  Administered 2017-11-09: 12.5 mg via INTRAVENOUS
  Filled 2017-11-09: qty 1

## 2017-11-09 MED ORDER — DIPHENHYDRAMINE HCL 50 MG/ML IJ SOLN
25.0000 mg | Freq: Once | INTRAMUSCULAR | Status: AC
  Administered 2017-11-09: 25 mg via INTRAVENOUS
  Filled 2017-11-09: qty 1

## 2017-11-09 MED ORDER — EPINEPHRINE 0.3 MG/0.3ML IJ SOAJ
0.3000 mg | Freq: Once | INTRAMUSCULAR | Status: AC
  Administered 2017-11-09: 0.3 mg via INTRAMUSCULAR
  Filled 2017-11-09: qty 0.3

## 2017-11-09 MED ORDER — ONDANSETRON HCL 4 MG/2ML IJ SOLN
4.0000 mg | Freq: Once | INTRAMUSCULAR | Status: AC
  Administered 2017-11-09: 4 mg via INTRAVENOUS
  Filled 2017-11-09: qty 2

## 2017-11-09 MED ORDER — METHYLPREDNISOLONE SODIUM SUCC 125 MG IJ SOLR
125.0000 mg | Freq: Once | INTRAMUSCULAR | Status: AC
  Administered 2017-11-09: 125 mg via INTRAVENOUS
  Filled 2017-11-09: qty 2

## 2017-11-09 MED ORDER — POTASSIUM CHLORIDE 10 MEQ/100ML IV SOLN
10.0000 meq | INTRAVENOUS | Status: AC
  Administered 2017-11-09 (×4): 10 meq via INTRAVENOUS
  Filled 2017-11-09 (×4): qty 100

## 2017-11-09 MED ORDER — FAMOTIDINE IN NACL 20-0.9 MG/50ML-% IV SOLN
20.0000 mg | Freq: Once | INTRAVENOUS | Status: AC
  Administered 2017-11-09: 20 mg via INTRAVENOUS
  Filled 2017-11-09: qty 50

## 2017-11-09 NOTE — ED Notes (Signed)
Called # given by tranfer line to call for someone to ransfer Pt to [facility] ER.  Robin at [facility] called back for report.

## 2017-11-09 NOTE — ED Notes (Signed)
EDP at bedside updating patient and family. 

## 2017-11-09 NOTE — ED Notes (Signed)
EDP at bedside  

## 2017-11-09 NOTE — ED Notes (Signed)
Pt reports she is beginning to have diarrhea again.

## 2017-11-09 NOTE — ED Notes (Signed)
Unable to place pt on cardiac monitor due to allergy to electrodes. EDP notified.

## 2017-11-09 NOTE — ED Triage Notes (Signed)
Pt was at [facility] yesterday and discharged home after receiving a feeding tube in LLQ of abdomen. Pt has multiple allergies and states she cannot have Chlorhexidine, latex, plastic tape, adhesives touch her without causing severe allergic reactions. Pt states she took Benadryl to help stop the reactions, but was instructed by [facility] Staff not to take any more than what was provided her. Pt presents SOB and states her throat feels like her throat is closing up. Pt states she has N/V/D since yesterday. Pt has a PowerPort in upper right chest. Pt states she has an EpiPen but was hesitant to use it.

## 2017-11-09 NOTE — ED Notes (Addendum)
Called [facility] transfer line as requested by Dr. Lacinda Axon to transfer pt to [facility].

## 2017-11-09 NOTE — ED Notes (Signed)
[facility] here to transfer Pt to [facility] ER.
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
## 2017-11-11 DIAGNOSIS — T85528A Displacement of other gastrointestinal prosthetic devices, implants and grafts, initial encounter: Secondary | ICD-10-CM | POA: Diagnosis not present

## 2017-11-11 DIAGNOSIS — R079 Chest pain, unspecified: Secondary | ICD-10-CM | POA: Diagnosis not present

## 2017-11-11 DIAGNOSIS — R1084 Generalized abdominal pain: Secondary | ICD-10-CM | POA: Diagnosis not present

## 2017-11-12 DIAGNOSIS — K219 Gastro-esophageal reflux disease without esophagitis: Secondary | ICD-10-CM | POA: Diagnosis present

## 2017-11-12 DIAGNOSIS — Z86718 Personal history of other venous thrombosis and embolism: Secondary | ICD-10-CM | POA: Diagnosis not present

## 2017-11-12 DIAGNOSIS — F332 Major depressive disorder, recurrent severe without psychotic features: Secondary | ICD-10-CM | POA: Diagnosis present

## 2017-11-12 DIAGNOSIS — Z7901 Long term (current) use of anticoagulants: Secondary | ICD-10-CM | POA: Diagnosis not present

## 2017-11-12 DIAGNOSIS — Z62810 Personal history of physical and sexual abuse in childhood: Secondary | ICD-10-CM | POA: Diagnosis present

## 2017-11-12 DIAGNOSIS — Z79899 Other long term (current) drug therapy: Secondary | ICD-10-CM | POA: Diagnosis not present

## 2017-11-12 DIAGNOSIS — K9413 Enterostomy malfunction: Secondary | ICD-10-CM | POA: Diagnosis not present

## 2017-11-12 DIAGNOSIS — F4310 Post-traumatic stress disorder, unspecified: Secondary | ICD-10-CM | POA: Diagnosis present

## 2017-11-12 DIAGNOSIS — E43 Unspecified severe protein-calorie malnutrition: Secondary | ICD-10-CM | POA: Diagnosis present

## 2017-11-12 DIAGNOSIS — G8929 Other chronic pain: Secondary | ICD-10-CM | POA: Diagnosis present

## 2017-11-12 DIAGNOSIS — F410 Panic disorder [episodic paroxysmal anxiety] without agoraphobia: Secondary | ICD-10-CM | POA: Diagnosis present

## 2017-11-12 DIAGNOSIS — Z7951 Long term (current) use of inhaled steroids: Secondary | ICD-10-CM | POA: Diagnosis not present

## 2017-11-12 DIAGNOSIS — R112 Nausea with vomiting, unspecified: Secondary | ICD-10-CM | POA: Diagnosis present

## 2017-11-12 DIAGNOSIS — E860 Dehydration: Secondary | ICD-10-CM | POA: Diagnosis present

## 2017-11-12 DIAGNOSIS — R45851 Suicidal ideations: Secondary | ICD-10-CM | POA: Diagnosis present

## 2017-11-12 DIAGNOSIS — Z8659 Personal history of other mental and behavioral disorders: Secondary | ICD-10-CM | POA: Diagnosis not present

## 2017-11-12 DIAGNOSIS — F17210 Nicotine dependence, cigarettes, uncomplicated: Secondary | ICD-10-CM | POA: Diagnosis present

## 2017-11-12 DIAGNOSIS — Z79891 Long term (current) use of opiate analgesic: Secondary | ICD-10-CM | POA: Diagnosis not present

## 2017-11-12 DIAGNOSIS — E876 Hypokalemia: Secondary | ICD-10-CM | POA: Diagnosis present

## 2017-11-12 DIAGNOSIS — R197 Diarrhea, unspecified: Secondary | ICD-10-CM | POA: Diagnosis present

## 2017-11-12 DIAGNOSIS — K9589 Other complications of other bariatric procedure: Secondary | ICD-10-CM | POA: Diagnosis present

## 2017-11-12 DIAGNOSIS — G4700 Insomnia, unspecified: Secondary | ICD-10-CM | POA: Diagnosis present

## 2017-11-12 DIAGNOSIS — Z6826 Body mass index (BMI) 26.0-26.9, adult: Secondary | ICD-10-CM | POA: Diagnosis not present

## 2017-11-12 DIAGNOSIS — F428 Other obsessive-compulsive disorder: Secondary | ICD-10-CM | POA: Diagnosis present

## 2017-11-12 DIAGNOSIS — R1084 Generalized abdominal pain: Secondary | ICD-10-CM | POA: Diagnosis not present

## 2017-11-12 DIAGNOSIS — D649 Anemia, unspecified: Secondary | ICD-10-CM | POA: Diagnosis present

## 2017-11-12 DIAGNOSIS — K912 Postsurgical malabsorption, not elsewhere classified: Secondary | ICD-10-CM | POA: Diagnosis present

## 2017-11-13 ENCOUNTER — Encounter (HOSPITAL_COMMUNITY): Payer: Self-pay

## 2017-11-23 MED ORDER — PROMETHAZINE HCL 12.5 MG RE SUPP
12.50 | RECTAL | Status: DC
Start: ? — End: 2017-11-23

## 2017-11-23 MED ORDER — ACETAMINOPHEN 325 MG PO TABS
650.00 | ORAL_TABLET | ORAL | Status: DC
Start: ? — End: 2017-11-23

## 2017-11-23 MED ORDER — BENZOCAINE 20 % MT GEL
OROMUCOSAL | Status: DC
Start: ? — End: 2017-11-23

## 2017-11-23 MED ORDER — DEXTROSE 10 % IV SOLN
50.00 | INTRAVENOUS | Status: DC
Start: ? — End: 2017-11-23

## 2017-11-23 MED ORDER — SODIUM CHLORIDE 0.9 % IJ SOLN
10.00 | INTRAMUSCULAR | Status: DC
Start: 2017-11-23 — End: 2017-11-23

## 2017-11-23 MED ORDER — PANTOPRAZOLE SODIUM 40 MG PO TBEC
40.00 | DELAYED_RELEASE_TABLET | ORAL | Status: DC
Start: 2017-11-24 — End: 2017-11-23

## 2017-11-23 MED ORDER — VALPROIC ACID 250 MG/5ML PO SOLN
125.00 | ORAL | Status: DC
Start: 2017-11-23 — End: 2017-11-23

## 2017-11-23 MED ORDER — VALPROIC ACID 250 MG/5ML PO SOLN
500.00 | ORAL | Status: DC
Start: 2017-11-23 — End: 2017-11-23

## 2017-11-23 MED ORDER — FLUOXETINE HCL 20 MG/5ML PO SOLN
20.00 | ORAL | Status: DC
Start: 2017-11-24 — End: 2017-11-23

## 2017-11-23 MED ORDER — ONDANSETRON HCL 4 MG PO TABS
4.00 | ORAL_TABLET | ORAL | Status: DC
Start: ? — End: 2017-11-23

## 2017-11-23 MED ORDER — OXYCODONE HCL 5 MG/5ML PO SOLN
5.00 | ORAL | Status: DC
Start: ? — End: 2017-11-23

## 2017-11-23 MED ORDER — QUETIAPINE FUMARATE 200 MG PO TABS
200.00 | ORAL_TABLET | ORAL | Status: DC
Start: 2017-11-23 — End: 2017-11-23

## 2017-11-23 MED ORDER — SODIUM CHLORIDE 0.9 % IJ SOLN
20.00 | INTRAMUSCULAR | Status: DC
Start: ? — End: 2017-11-23

## 2017-11-23 MED ORDER — ENOXAPARIN SODIUM 40 MG/0.4ML ~~LOC~~ SOLN
40.00 | SUBCUTANEOUS | Status: DC
Start: 2017-11-24 — End: 2017-11-23

## 2017-11-23 MED ORDER — LOPERAMIDE HCL 2 MG PO CAPS
2.00 | ORAL_CAPSULE | ORAL | Status: DC
Start: ? — End: 2017-11-23

## 2017-11-23 MED ORDER — SODIUM CHLORIDE 0.9 % IJ SOLN
10.00 | INTRAMUSCULAR | Status: DC
Start: ? — End: 2017-11-23

## 2017-11-23 MED ORDER — GENERIC EXTERNAL MEDICATION
5.00 | Status: DC
Start: ? — End: 2017-11-23

## 2017-11-23 MED ORDER — CLONAZEPAM 0.5 MG PO TBDP
0.50 | ORAL_TABLET | ORAL | Status: DC
Start: 2017-11-24 — End: 2017-11-23

## 2017-11-23 MED ORDER — LINACLOTIDE 145 MCG PO CAPS
145.00 | ORAL_CAPSULE | ORAL | Status: DC
Start: 2017-11-24 — End: 2017-11-23

## 2017-11-23 MED ORDER — ALBUTEROL SULFATE HFA 108 (90 BASE) MCG/ACT IN AERS
2.00 | INHALATION_SPRAY | RESPIRATORY_TRACT | Status: DC
Start: ? — End: 2017-11-23

## 2017-11-23 MED ORDER — ZINC OXIDE 20 % EX OINT
TOPICAL_OINTMENT | CUTANEOUS | Status: DC
Start: 2017-11-23 — End: 2017-11-23

## 2017-11-23 MED ORDER — KCL IN DEXTROSE-NACL 40-5-0.45 MEQ/L-%-% IV SOLN
INTRAVENOUS | Status: DC
Start: ? — End: 2017-11-23

## 2017-11-23 MED ORDER — SCOPOLAMINE 1 MG/3DAYS TD PT72
1.00 | MEDICATED_PATCH | TRANSDERMAL | Status: DC
Start: 2017-11-23 — End: 2017-11-23

## 2017-11-23 MED ORDER — MELATONIN 3 MG PO TABS
3.00 | ORAL_TABLET | ORAL | Status: DC
Start: 2017-11-23 — End: 2017-11-23

## 2017-11-23 MED ORDER — GENERIC EXTERNAL MEDICATION
3.00 | Status: DC
Start: ? — End: 2017-11-23

## 2017-11-23 MED ORDER — GABAPENTIN 300 MG/6ML PO SOLN
500.00 | ORAL | Status: DC
Start: 2017-11-23 — End: 2017-11-23

## 2017-11-23 MED ORDER — CYCLOSPORINE 0.05 % OP EMUL
1.00 | OPHTHALMIC | Status: DC
Start: 2017-11-24 — End: 2017-11-23

## 2017-11-24 DIAGNOSIS — K9413 Enterostomy malfunction: Secondary | ICD-10-CM | POA: Diagnosis not present

## 2017-11-24 DIAGNOSIS — F419 Anxiety disorder, unspecified: Secondary | ICD-10-CM | POA: Diagnosis not present

## 2017-11-24 DIAGNOSIS — Z86718 Personal history of other venous thrombosis and embolism: Secondary | ICD-10-CM | POA: Diagnosis not present

## 2017-11-24 DIAGNOSIS — Z9884 Bariatric surgery status: Secondary | ICD-10-CM | POA: Diagnosis not present

## 2017-11-24 DIAGNOSIS — K449 Diaphragmatic hernia without obstruction or gangrene: Secondary | ICD-10-CM | POA: Diagnosis not present

## 2017-11-24 DIAGNOSIS — R110 Nausea: Secondary | ICD-10-CM | POA: Diagnosis not present

## 2017-11-24 DIAGNOSIS — D649 Anemia, unspecified: Secondary | ICD-10-CM | POA: Diagnosis not present

## 2017-11-24 DIAGNOSIS — E43 Unspecified severe protein-calorie malnutrition: Secondary | ICD-10-CM | POA: Diagnosis not present

## 2017-11-24 DIAGNOSIS — F329 Major depressive disorder, single episode, unspecified: Secondary | ICD-10-CM | POA: Diagnosis not present

## 2017-11-24 DIAGNOSIS — K219 Gastro-esophageal reflux disease without esophagitis: Secondary | ICD-10-CM | POA: Diagnosis not present

## 2017-11-24 DIAGNOSIS — K228 Other specified diseases of esophagus: Secondary | ICD-10-CM | POA: Diagnosis not present

## 2017-11-24 DIAGNOSIS — D689 Coagulation defect, unspecified: Secondary | ICD-10-CM | POA: Diagnosis not present

## 2017-11-25 DIAGNOSIS — K228 Other specified diseases of esophagus: Secondary | ICD-10-CM | POA: Diagnosis not present

## 2017-11-25 DIAGNOSIS — F419 Anxiety disorder, unspecified: Secondary | ICD-10-CM | POA: Diagnosis not present

## 2017-11-25 DIAGNOSIS — F329 Major depressive disorder, single episode, unspecified: Secondary | ICD-10-CM | POA: Diagnosis not present

## 2017-11-25 DIAGNOSIS — K219 Gastro-esophageal reflux disease without esophagitis: Secondary | ICD-10-CM | POA: Diagnosis not present

## 2017-11-25 DIAGNOSIS — K449 Diaphragmatic hernia without obstruction or gangrene: Secondary | ICD-10-CM | POA: Diagnosis not present

## 2017-11-25 DIAGNOSIS — K9413 Enterostomy malfunction: Secondary | ICD-10-CM | POA: Diagnosis not present

## 2017-11-27 ENCOUNTER — Encounter: Payer: Self-pay | Admitting: Nurse Practitioner

## 2017-11-27 ENCOUNTER — Ambulatory Visit (INDEPENDENT_AMBULATORY_CARE_PROVIDER_SITE_OTHER): Payer: Medicare Other | Admitting: Nurse Practitioner

## 2017-11-27 VITALS — BP 122/84 | Temp 99.3°F | Ht 64.0 in | Wt 157.0 lb

## 2017-11-27 DIAGNOSIS — R300 Dysuria: Secondary | ICD-10-CM

## 2017-11-27 LAB — POCT URINALYSIS DIPSTICK
Spec Grav, UA: >=1.03 — AB (ref 1.010–1.025)
pH, UA: 5 (ref 5.0–8.0)

## 2017-11-27 MED ORDER — SULFAMETHOXAZOLE-TRIMETHOPRIM 800-160 MG PO TABS
1.0000 | ORAL_TABLET | Freq: Two times a day (BID) | ORAL | 0 refills | Status: DC
Start: 2017-11-27 — End: 2017-12-12

## 2017-11-27 MED ORDER — PHENAZOPYRIDINE HCL 100 MG PO TABS: 100.0000 mg | ORAL_TABLET | Freq: Three times a day (TID) | ORAL | 0 refills | Status: DC | PRN

## 2017-11-28 ENCOUNTER — Encounter: Payer: Self-pay | Admitting: Nurse Practitioner

## 2017-11-28 DIAGNOSIS — F419 Anxiety disorder, unspecified: Secondary | ICD-10-CM | POA: Diagnosis not present

## 2017-11-28 DIAGNOSIS — F3162 Bipolar disorder, current episode mixed, moderate: Secondary | ICD-10-CM | POA: Diagnosis not present

## 2017-11-28 DIAGNOSIS — F4310 Post-traumatic stress disorder, unspecified: Secondary | ICD-10-CM | POA: Diagnosis not present

## 2017-11-28 DIAGNOSIS — F5105 Insomnia due to other mental disorder: Secondary | ICD-10-CM | POA: Diagnosis not present

## 2017-11-28 NOTE — Progress Notes (Signed)
Subjective:  Presents for c/o urinary symptoms for the past week. Was given 3 days of Bactrim while in [facility] for other issues last week. Has been off med for 4 days. Was getting better but symptoms have come back. Has complex urinary history but nor recent UTI. Low grade fever. Had a catheter at one time. Having urinary symptoms including urgency, frequency, burning and dribbling. Left low back pain. No new sexual partners. No N/V at this time. Has been applying zinc oxide around feeding tube. Would like this assessed. Doing much better. Now eating and has gained some weight.   Objective:   BP 122/84   Temp 99.3 F (37.4 C) (Oral)   Ht 5\' 4"  (1.626 m)   Wt 157 lb (71.2 kg)   BMI 26.95 kg/m  NAD. Alert, oriented. Lungs clear. Heart RRR. J tube: shiny mild erythema around tube insertion site. Positive mild left CVA tenderness. Lower abdomen minimal tenderness.  Results for orders placed or performed in visit on 11/27/17  POCT urinalysis dipstick  Result Value Ref Range   Color, UA     Clarity, UA     Glucose, UA  Negative   Bilirubin, UA ++    Ketones, UA     Spec Grav, UA >=1.030 (A) 1.010 - 1.025   Blood, UA     pH, UA 5.0 5.0 - 8.0   Protein, UA  Negative   Urobilinogen, UA  0.2 or 1.0 E.U./dL   Nitrite, UA     Leukocytes, UA Small (1+) (A) Negative   Appearance     Odor       Assessment:  Dysuria - Plan: POCT urinalysis dipstick Possible unresolved UTI    Plan:   Meds ordered this encounter  Medications  . sulfamethoxazole-trimethoprim (BACTRIM DS,SEPTRA DS) 800-160 MG tablet    Sig: Take 1 tablet by mouth 2 (two) times daily.    Dispense:  14 tablet    Refill:  0    Order Specific Question:   Supervising Provider    Answer:   Mikey Kirschner [[id number]]  . phenazopyridine (PYRIDIUM) 100 MG tablet    Sig: Take 1 tablet (100 mg total) by mouth 3 (three) times daily as needed for pain.    Dispense:  10 tablet    Refill:  0    Order Specific Question:    Supervising Provider    Answer:   Mikey Kirschner [[id number]]   Continue Bactrim as directed. May use Pyridium for 48 hours then DC. Warning signs reviewed. Call back in 48 hours if no improvement, sooner if worse.  Area around j tube most likely yeast infection. Start anti yeast powder that she has been prescribed mixed with zinc oxide. Keep area as clean and dry as possible.

## 2017-11-29 ENCOUNTER — Other Ambulatory Visit: Payer: Self-pay | Admitting: Nurse Practitioner

## 2017-11-29 DIAGNOSIS — K228 Other specified diseases of esophagus: Secondary | ICD-10-CM | POA: Diagnosis not present

## 2017-11-29 DIAGNOSIS — F419 Anxiety disorder, unspecified: Secondary | ICD-10-CM | POA: Diagnosis not present

## 2017-11-29 DIAGNOSIS — K219 Gastro-esophageal reflux disease without esophagitis: Secondary | ICD-10-CM | POA: Diagnosis not present

## 2017-11-29 DIAGNOSIS — K449 Diaphragmatic hernia without obstruction or gangrene: Secondary | ICD-10-CM | POA: Diagnosis not present

## 2017-11-29 DIAGNOSIS — F329 Major depressive disorder, single episode, unspecified: Secondary | ICD-10-CM | POA: Diagnosis not present

## 2017-11-29 DIAGNOSIS — K9413 Enterostomy malfunction: Secondary | ICD-10-CM | POA: Diagnosis not present

## 2017-11-29 MED ORDER — FLUCONAZOLE 150 MG PO TABS: ORAL_TABLET | ORAL | 0 refills | Status: DC

## 2017-12-03 ENCOUNTER — Inpatient Hospital Stay (HOSPITAL_COMMUNITY): Admission: RE | Admit: 2017-12-03 | Payer: Self-pay | Source: Ambulatory Visit

## 2017-12-04 DIAGNOSIS — F4310 Post-traumatic stress disorder, unspecified: Secondary | ICD-10-CM | POA: Diagnosis not present

## 2017-12-04 DIAGNOSIS — Z87891 Personal history of nicotine dependence: Secondary | ICD-10-CM | POA: Diagnosis not present

## 2017-12-04 DIAGNOSIS — Z934 Other artificial openings of gastrointestinal tract status: Secondary | ICD-10-CM | POA: Diagnosis not present

## 2017-12-04 DIAGNOSIS — Z9884 Bariatric surgery status: Secondary | ICD-10-CM | POA: Diagnosis not present

## 2017-12-04 DIAGNOSIS — F3162 Bipolar disorder, current episode mixed, moderate: Secondary | ICD-10-CM | POA: Diagnosis not present

## 2017-12-04 DIAGNOSIS — Z48815 Encounter for surgical aftercare following surgery on the digestive system: Secondary | ICD-10-CM | POA: Diagnosis not present

## 2017-12-05 DIAGNOSIS — F329 Major depressive disorder, single episode, unspecified: Secondary | ICD-10-CM | POA: Diagnosis not present

## 2017-12-05 DIAGNOSIS — K9413 Enterostomy malfunction: Secondary | ICD-10-CM | POA: Diagnosis not present

## 2017-12-05 DIAGNOSIS — F419 Anxiety disorder, unspecified: Secondary | ICD-10-CM | POA: Diagnosis not present

## 2017-12-05 DIAGNOSIS — K219 Gastro-esophageal reflux disease without esophagitis: Secondary | ICD-10-CM | POA: Diagnosis not present

## 2017-12-05 DIAGNOSIS — K228 Other specified diseases of esophagus: Secondary | ICD-10-CM | POA: Diagnosis not present

## 2017-12-05 DIAGNOSIS — K449 Diaphragmatic hernia without obstruction or gangrene: Secondary | ICD-10-CM | POA: Diagnosis not present

## 2017-12-09 DIAGNOSIS — F329 Major depressive disorder, single episode, unspecified: Secondary | ICD-10-CM | POA: Diagnosis not present

## 2017-12-09 DIAGNOSIS — K219 Gastro-esophageal reflux disease without esophagitis: Secondary | ICD-10-CM | POA: Diagnosis not present

## 2017-12-09 DIAGNOSIS — K228 Other specified diseases of esophagus: Secondary | ICD-10-CM | POA: Diagnosis not present

## 2017-12-09 DIAGNOSIS — K449 Diaphragmatic hernia without obstruction or gangrene: Secondary | ICD-10-CM | POA: Diagnosis not present

## 2017-12-09 DIAGNOSIS — K9413 Enterostomy malfunction: Secondary | ICD-10-CM | POA: Diagnosis not present

## 2017-12-09 DIAGNOSIS — F419 Anxiety disorder, unspecified: Secondary | ICD-10-CM | POA: Diagnosis not present

## 2017-12-10 DIAGNOSIS — Z95828 Presence of other vascular implants and grafts: Secondary | ICD-10-CM | POA: Diagnosis not present

## 2017-12-10 DIAGNOSIS — Z7901 Long term (current) use of anticoagulants: Secondary | ICD-10-CM | POA: Diagnosis not present

## 2017-12-10 DIAGNOSIS — F4310 Post-traumatic stress disorder, unspecified: Secondary | ICD-10-CM | POA: Diagnosis not present

## 2017-12-10 DIAGNOSIS — D509 Iron deficiency anemia, unspecified: Secondary | ICD-10-CM | POA: Diagnosis not present

## 2017-12-10 DIAGNOSIS — F3132 Bipolar disorder, current episode depressed, moderate: Secondary | ICD-10-CM | POA: Diagnosis not present

## 2017-12-11 DIAGNOSIS — D509 Iron deficiency anemia, unspecified: Secondary | ICD-10-CM | POA: Diagnosis not present

## 2017-12-11 DIAGNOSIS — E282 Polycystic ovarian syndrome: Secondary | ICD-10-CM | POA: Diagnosis not present

## 2017-12-11 DIAGNOSIS — L680 Hirsutism: Secondary | ICD-10-CM | POA: Diagnosis not present

## 2017-12-11 DIAGNOSIS — Z9884 Bariatric surgery status: Secondary | ICD-10-CM | POA: Diagnosis not present

## 2017-12-11 DIAGNOSIS — Z79899 Other long term (current) drug therapy: Secondary | ICD-10-CM | POA: Diagnosis not present

## 2017-12-11 DIAGNOSIS — Z48815 Encounter for surgical aftercare following surgery on the digestive system: Secondary | ICD-10-CM | POA: Diagnosis not present

## 2017-12-11 DIAGNOSIS — K9423 Gastrostomy malfunction: Secondary | ICD-10-CM | POA: Diagnosis not present

## 2017-12-11 DIAGNOSIS — K912 Postsurgical malabsorption, not elsewhere classified: Secondary | ICD-10-CM | POA: Diagnosis not present

## 2017-12-11 DIAGNOSIS — E161 Other hypoglycemia: Secondary | ICD-10-CM | POA: Diagnosis not present

## 2017-12-11 DIAGNOSIS — Z9049 Acquired absence of other specified parts of digestive tract: Secondary | ICD-10-CM | POA: Diagnosis not present

## 2017-12-11 DIAGNOSIS — Z934 Other artificial openings of gastrointestinal tract status: Secondary | ICD-10-CM | POA: Diagnosis not present

## 2017-12-11 DIAGNOSIS — Z713 Dietary counseling and surveillance: Secondary | ICD-10-CM | POA: Diagnosis not present

## 2017-12-11 DIAGNOSIS — R1013 Epigastric pain: Secondary | ICD-10-CM | POA: Diagnosis not present

## 2017-12-11 DIAGNOSIS — R110 Nausea: Secondary | ICD-10-CM | POA: Diagnosis not present

## 2017-12-11 DIAGNOSIS — Z87891 Personal history of nicotine dependence: Secondary | ICD-10-CM | POA: Diagnosis not present

## 2017-12-12 ENCOUNTER — Encounter: Payer: Self-pay | Admitting: Advanced Practice Midwife

## 2017-12-12 ENCOUNTER — Other Ambulatory Visit: Payer: Self-pay

## 2017-12-12 ENCOUNTER — Ambulatory Visit (INDEPENDENT_AMBULATORY_CARE_PROVIDER_SITE_OTHER): Payer: Medicare Other | Admitting: Advanced Practice Midwife

## 2017-12-12 VITALS — BP 111/78 | HR 85 | Ht 64.0 in | Wt 158.0 lb

## 2017-12-12 DIAGNOSIS — N898 Other specified noninflammatory disorders of vagina: Secondary | ICD-10-CM | POA: Diagnosis not present

## 2017-12-12 DIAGNOSIS — B9689 Other specified bacterial agents as the cause of diseases classified elsewhere: Secondary | ICD-10-CM

## 2017-12-12 DIAGNOSIS — N760 Acute vaginitis: Secondary | ICD-10-CM | POA: Diagnosis not present

## 2017-12-12 MED ORDER — DOXYCYCLINE HYCLATE 100 MG PO CAPS: 100.0000 mg | ORAL_CAPSULE | Freq: Two times a day (BID) | ORAL | 0 refills | Status: DC

## 2017-12-12 MED ORDER — SECNIDAZOLE 2 G PO PACK: 2.0000 g | PACK | Freq: Once | ORAL | 0 refills | Status: AC
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
## 2017-12-13 ENCOUNTER — Encounter: Payer: Self-pay | Admitting: Advanced Practice Midwife

## 2017-12-13 LAB — GC/CHLAMYDIA PROBE AMP
CHLAMYDIA, DNA PROBE: NEGATIVE
NEISSERIA GONORRHOEAE BY PCR: NEGATIVE

## 2017-12-17 DIAGNOSIS — Z9884 Bariatric surgery status: Secondary | ICD-10-CM | POA: Diagnosis not present

## 2017-12-17 DIAGNOSIS — R110 Nausea: Secondary | ICD-10-CM | POA: Diagnosis not present

## 2017-12-17 DIAGNOSIS — Z934 Other artificial openings of gastrointestinal tract status: Secondary | ICD-10-CM | POA: Diagnosis not present

## 2017-12-17 DIAGNOSIS — F4310 Post-traumatic stress disorder, unspecified: Secondary | ICD-10-CM | POA: Diagnosis not present

## 2017-12-17 DIAGNOSIS — D509 Iron deficiency anemia, unspecified: Secondary | ICD-10-CM | POA: Diagnosis not present

## 2017-12-17 DIAGNOSIS — Z95828 Presence of other vascular implants and grafts: Secondary | ICD-10-CM | POA: Diagnosis not present

## 2017-12-17 DIAGNOSIS — I824Y3 Acute embolism and thrombosis of unspecified deep veins of proximal lower extremity, bilateral: Secondary | ICD-10-CM | POA: Diagnosis not present

## 2017-12-17 DIAGNOSIS — F3132 Bipolar disorder, current episode depressed, moderate: Secondary | ICD-10-CM | POA: Diagnosis not present

## 2017-12-17 DIAGNOSIS — D649 Anemia, unspecified: Secondary | ICD-10-CM | POA: Diagnosis not present

## 2017-12-17 DIAGNOSIS — E282 Polycystic ovarian syndrome: Secondary | ICD-10-CM | POA: Diagnosis not present

## 2017-12-17 DIAGNOSIS — Z7901 Long term (current) use of anticoagulants: Secondary | ICD-10-CM | POA: Diagnosis not present

## 2017-12-17 DIAGNOSIS — K912 Postsurgical malabsorption, not elsewhere classified: Secondary | ICD-10-CM | POA: Diagnosis not present

## 2017-12-18 ENCOUNTER — Encounter: Payer: Self-pay | Admitting: Family Medicine

## 2017-12-18 DIAGNOSIS — M9901 Segmental and somatic dysfunction of cervical region: Secondary | ICD-10-CM | POA: Diagnosis not present

## 2017-12-18 DIAGNOSIS — F419 Anxiety disorder, unspecified: Secondary | ICD-10-CM | POA: Diagnosis not present

## 2017-12-18 DIAGNOSIS — M546 Pain in thoracic spine: Secondary | ICD-10-CM | POA: Diagnosis not present

## 2017-12-18 DIAGNOSIS — M9903 Segmental and somatic dysfunction of lumbar region: Secondary | ICD-10-CM | POA: Diagnosis not present

## 2017-12-18 DIAGNOSIS — K219 Gastro-esophageal reflux disease without esophagitis: Secondary | ICD-10-CM | POA: Diagnosis not present

## 2017-12-18 DIAGNOSIS — M9902 Segmental and somatic dysfunction of thoracic region: Secondary | ICD-10-CM | POA: Diagnosis not present

## 2017-12-18 DIAGNOSIS — S335XXA Sprain of ligaments of lumbar spine, initial encounter: Secondary | ICD-10-CM | POA: Diagnosis not present

## 2017-12-18 DIAGNOSIS — F329 Major depressive disorder, single episode, unspecified: Secondary | ICD-10-CM | POA: Diagnosis not present

## 2017-12-18 DIAGNOSIS — K449 Diaphragmatic hernia without obstruction or gangrene: Secondary | ICD-10-CM | POA: Diagnosis not present

## 2017-12-18 DIAGNOSIS — S134XXA Sprain of ligaments of cervical spine, initial encounter: Secondary | ICD-10-CM | POA: Diagnosis not present

## 2017-12-18 DIAGNOSIS — K228 Other specified diseases of esophagus: Secondary | ICD-10-CM | POA: Diagnosis not present

## 2017-12-18 DIAGNOSIS — K9413 Enterostomy malfunction: Secondary | ICD-10-CM | POA: Diagnosis not present

## 2017-12-19 ENCOUNTER — Other Ambulatory Visit: Payer: Self-pay | Admitting: Family Medicine

## 2017-12-19 DIAGNOSIS — K9413 Enterostomy malfunction: Secondary | ICD-10-CM | POA: Diagnosis not present

## 2017-12-19 DIAGNOSIS — Z4659 Encounter for fitting and adjustment of other gastrointestinal appliance and device: Secondary | ICD-10-CM | POA: Diagnosis not present

## 2017-12-19 DIAGNOSIS — T85528A Displacement of other gastrointestinal prosthetic devices, implants and grafts, initial encounter: Secondary | ICD-10-CM | POA: Diagnosis not present

## 2017-12-19 MED ORDER — VARENICLINE TARTRATE 1 MG PO TABS: 1.0000 mg | ORAL_TABLET | Freq: Two times a day (BID) | ORAL | 1 refills | Status: DC

## 2017-12-19 MED ORDER — VARENICLINE TARTRATE 0.5 MG X 11 & 1 MG X 42 PO MISC: ORAL | 0 refills | Status: DC

## 2017-12-19 NOTE — Telephone Encounter (Signed)
Pt is aware we are working on this.

## 2017-12-19 NOTE — Telephone Encounter (Signed)
Per [facility] they do not have the rx for the starter pac. We faxed it eariler today and they are still saying they do not have it. So We re faxed it and she stated it will take around 20 mins for it to come through to them. She states no denial as they did not have the rx to deny. They had the rx for the maintained dose.

## 2017-12-20 ENCOUNTER — Telehealth: Payer: Self-pay

## 2017-12-20 ENCOUNTER — Ambulatory Visit (INDEPENDENT_AMBULATORY_CARE_PROVIDER_SITE_OTHER): Payer: Medicare Other | Admitting: Nurse Practitioner

## 2017-12-20 VITALS — BP 114/78 | Temp 97.8°F | Ht 64.0 in | Wt 165.4 lb

## 2017-12-20 DIAGNOSIS — S134XXA Sprain of ligaments of cervical spine, initial encounter: Secondary | ICD-10-CM | POA: Diagnosis not present

## 2017-12-20 DIAGNOSIS — M9902 Segmental and somatic dysfunction of thoracic region: Secondary | ICD-10-CM | POA: Diagnosis not present

## 2017-12-20 DIAGNOSIS — S335XXA Sprain of ligaments of lumbar spine, initial encounter: Secondary | ICD-10-CM | POA: Diagnosis not present

## 2017-12-20 DIAGNOSIS — G43019 Migraine without aura, intractable, without status migrainosus: Secondary | ICD-10-CM

## 2017-12-20 DIAGNOSIS — M9903 Segmental and somatic dysfunction of lumbar region: Secondary | ICD-10-CM | POA: Diagnosis not present

## 2017-12-20 DIAGNOSIS — S300XXA Contusion of lower back and pelvis, initial encounter: Secondary | ICD-10-CM

## 2017-12-20 DIAGNOSIS — M9901 Segmental and somatic dysfunction of cervical region: Secondary | ICD-10-CM | POA: Diagnosis not present

## 2017-12-20 DIAGNOSIS — Z720 Tobacco use: Secondary | ICD-10-CM | POA: Insufficient documentation

## 2017-12-20 DIAGNOSIS — M546 Pain in thoracic spine: Secondary | ICD-10-CM | POA: Diagnosis not present

## 2017-12-20 DIAGNOSIS — M545 Low back pain: Secondary | ICD-10-CM | POA: Diagnosis not present

## 2017-12-20 MED ORDER — KETOROLAC TROMETHAMINE 10 MG PO TABS: 10.0000 mg | ORAL_TABLET | Freq: Four times a day (QID) | ORAL | 0 refills | Status: DC | PRN

## 2017-12-20 NOTE — Telephone Encounter (Signed)
Pt insurance approved for her Chantix starter pack. Effective dates 06/02/2017-06/22/2018. I left a detailed message for the pt that her medications should be ready for pick up.Called [facility] (Amanda)to run it through.

## 2017-12-21 ENCOUNTER — Encounter: Payer: Self-pay | Admitting: Nurse Practitioner

## 2017-12-21 NOTE — Progress Notes (Signed)
Subjective:  Presents for c/o a mass in the left buttock that began after slipping in the shower back in May. Had some bruising which eventually resolved. Continues to have a firm mass in the area which has caused some tenderness after her recent weight loss. Causes sciatic/ nerve pain at times with pressure on the area with sitting. Also difficulty getting comfortable at night. Also mentions she is having a migraine as a result of anesthesia she received yesterday. Has taken Toradol in the past without difficulty. Has been given this while hospitalized according to patient.   Objective:   BP 114/78   Temp 97.8 F (36.6 C) (Oral)   Ht 5\' 4"  (1.626 m)   Wt 165 lb 6.4 oz (75 kg)   BMI 28.39 kg/m  NAD. Alert, oriented. Lungs clear. Heart RRR. Firm well defined mass noted deep in the mid left buttock approximately 4 cm in diameter. Tender to palpation.   Assessment:   Problem List Items Addressed This Visit      Cardiovascular and Mediastinum   Migraine headache   Relevant Medications   ketorolac (TORADOL) 10 MG tablet    Other Visit Diagnoses    Traumatic hematoma of buttock, initial encounter    -  Primary       Plan:   Meds ordered this encounter  Medications  . ketorolac (TORADOL) 10 MG tablet    Sig: Take 1 tablet (10 mg total) by mouth every 6 (six) hours as needed.    Dispense:  20 tablet    Refill:  0    Order Specific Question:   Supervising Provider    Answer:   Mikey Kirschner [[id number]]   Use toradol sparingly. Caution due to Eliquis use. Want to avoid opiates due to history. Will check to see next course of action for mass which is most likely a deep hematoma related to her injury.  Return if symptoms worsen or fail to improve.

## 2017-12-23 ENCOUNTER — Encounter: Payer: Self-pay | Admitting: Advanced Practice Midwife

## 2017-12-23 DIAGNOSIS — M9903 Segmental and somatic dysfunction of lumbar region: Secondary | ICD-10-CM | POA: Diagnosis not present

## 2017-12-23 DIAGNOSIS — M9902 Segmental and somatic dysfunction of thoracic region: Secondary | ICD-10-CM | POA: Diagnosis not present

## 2017-12-23 DIAGNOSIS — S335XXA Sprain of ligaments of lumbar spine, initial encounter: Secondary | ICD-10-CM | POA: Diagnosis not present

## 2017-12-23 DIAGNOSIS — S134XXA Sprain of ligaments of cervical spine, initial encounter: Secondary | ICD-10-CM | POA: Diagnosis not present

## 2017-12-23 DIAGNOSIS — M546 Pain in thoracic spine: Secondary | ICD-10-CM | POA: Diagnosis not present

## 2017-12-23 DIAGNOSIS — M9901 Segmental and somatic dysfunction of cervical region: Secondary | ICD-10-CM | POA: Diagnosis not present

## 2017-12-24 DIAGNOSIS — F419 Anxiety disorder, unspecified: Secondary | ICD-10-CM | POA: Diagnosis not present

## 2017-12-24 DIAGNOSIS — K9413 Enterostomy malfunction: Secondary | ICD-10-CM | POA: Diagnosis not present

## 2017-12-24 DIAGNOSIS — K219 Gastro-esophageal reflux disease without esophagitis: Secondary | ICD-10-CM | POA: Diagnosis not present

## 2017-12-24 DIAGNOSIS — K449 Diaphragmatic hernia without obstruction or gangrene: Secondary | ICD-10-CM | POA: Diagnosis not present

## 2017-12-24 DIAGNOSIS — K228 Other specified diseases of esophagus: Secondary | ICD-10-CM | POA: Diagnosis not present

## 2017-12-24 DIAGNOSIS — F329 Major depressive disorder, single episode, unspecified: Secondary | ICD-10-CM | POA: Diagnosis not present

## 2017-12-25 ENCOUNTER — Encounter (INDEPENDENT_AMBULATORY_CARE_PROVIDER_SITE_OTHER): Payer: Self-pay

## 2017-12-25 DIAGNOSIS — M792 Neuralgia and neuritis, unspecified: Secondary | ICD-10-CM | POA: Diagnosis not present

## 2017-12-25 DIAGNOSIS — T85528A Displacement of other gastrointestinal prosthetic devices, implants and grafts, initial encounter: Secondary | ICD-10-CM | POA: Diagnosis not present

## 2017-12-25 DIAGNOSIS — K912 Postsurgical malabsorption, not elsewhere classified: Secondary | ICD-10-CM | POA: Diagnosis not present

## 2017-12-25 DIAGNOSIS — E43 Unspecified severe protein-calorie malnutrition: Secondary | ICD-10-CM | POA: Diagnosis not present

## 2017-12-25 DIAGNOSIS — D509 Iron deficiency anemia, unspecified: Secondary | ICD-10-CM | POA: Diagnosis not present

## 2017-12-25 DIAGNOSIS — I824Y3 Acute embolism and thrombosis of unspecified deep veins of proximal lower extremity, bilateral: Secondary | ICD-10-CM | POA: Diagnosis not present

## 2017-12-26 DIAGNOSIS — F3132 Bipolar disorder, current episode depressed, moderate: Secondary | ICD-10-CM | POA: Diagnosis not present

## 2017-12-27 ENCOUNTER — Other Ambulatory Visit: Payer: Self-pay | Admitting: Women's Health

## 2017-12-27 ENCOUNTER — Encounter: Payer: Self-pay | Admitting: Advanced Practice Midwife

## 2017-12-27 DIAGNOSIS — D509 Iron deficiency anemia, unspecified: Secondary | ICD-10-CM | POA: Diagnosis not present

## 2017-12-27 MED ORDER — FLUCONAZOLE 150 MG PO TABS: 150.0000 mg | ORAL_TABLET | Freq: Once | ORAL | 0 refills | Status: AC

## 2017-12-30 ENCOUNTER — Encounter: Payer: Self-pay | Admitting: Allergy & Immunology

## 2017-12-30 ENCOUNTER — Ambulatory Visit (INDEPENDENT_AMBULATORY_CARE_PROVIDER_SITE_OTHER): Payer: Medicare Other | Admitting: Allergy & Immunology

## 2017-12-30 VITALS — BP 102/60 | HR 74 | Resp 74

## 2017-12-30 DIAGNOSIS — T782XXD Anaphylactic shock, unspecified, subsequent encounter: Secondary | ICD-10-CM

## 2017-12-30 DIAGNOSIS — L501 Idiopathic urticaria: Secondary | ICD-10-CM | POA: Diagnosis not present

## 2017-12-30 DIAGNOSIS — L239 Allergic contact dermatitis, unspecified cause: Secondary | ICD-10-CM | POA: Diagnosis not present

## 2017-12-30 DIAGNOSIS — R05 Cough: Secondary | ICD-10-CM

## 2017-12-30 DIAGNOSIS — R059 Cough, unspecified: Secondary | ICD-10-CM

## 2017-12-30 MED ORDER — METHYLPREDNISOLONE ACETATE 80 MG/ML IJ SUSP
80.0000 mg | Freq: Once | INTRAMUSCULAR | Status: AC
  Administered 2017-12-30: 80 mg via INTRAMUSCULAR

## 2017-12-30 NOTE — Progress Notes (Signed)
FOLLOW UP  Date of Service/Encounter:  12/30/17   Assessment:   Chronic idiopathic urticaria - had been improved on Xoalir  Seasonal and perennial allergic rhinitis (cat, dog, grass, roach, trees, ragweed)   Oral thrush  Allergic contact dermatitis  Alpha gal syndrome  Complex medical history, including multiple GI surgeries, anemia, and bipolar depression   [name] returns to our clinic after a nearly 54-month hiatus.  Evidently, she has been in the hospital for months at a time due to persistent vomiting, nausea, and weight loss.  She now has a J-tube in place, but thankfully has not been using it recently.  She has been able to maintain p.o.  Her marked allergic responses to a multitude of allergic triggers continues to be a problem.  She is visually uncomfortable today with pruritus throughout the entire visit.  I am still at a loss as to the cause of all of her symptoms, but I continue to think that her psychiatric conditions are certainly playing a strong role as well as her polypharmacy.  She does have a new propensity for anaphylaxis-like symptoms to a multitude of medications, which will likely be difficult if not impossible to sort out due to her oftentimes overwhelming anxiety.  In any case, she was doing well on Xolair, which she only received twice last fall.  We will reinitiate the Xolair as a means of immunomodulation.  We did discuss allergy shots as well briefly, but I would like to be let her be stable on Xolair for a while before we discussed starting allergy shots.  We did give her a dose of prednisone today to fast-track her recovery.  We will refill all of her medications and get her back on a sustainable path so that she can focus her attention on her host of other medical issues.    Plan/Recommendations:   1. Chronic idiopathic urticaria - planning to re-initiate Xolair - In the meantime, start suppressive dosing of antihistamines:   - Morning: Xyzal  (levocetirizine) 10mg  (two tablets)   - Evening: Zyrtec (cetirizine) 20mg  (two tablets) + Zantac (ranitidine) 300mg  - If you have breakthrough rashes/hives, take cetirizine 40mg  (four tablets) to suppress the reaction.  2. Perennial and seasonal allergic rhinitis (cats, dogs, grasses, trees, weeds) - I am still not convinced that the allergy shots would be helpful.  - Continue to monitor exposures to see what might be triggering these reactions.  - We will add on an eye drop as well: Pataday one drop per eye twice daily. - We will give one dose of DepoMedrol 80mg .   3. Contact dermatitis - Continue with Eucrisa one application twice daily.  - Continue with moisturizing with your [facility] with sunscreen.   4. Return in about 3 months (around 04/01/2018).   Subjective:   [name] is a 37 y.o. female presenting today for follow up of  Chief Complaint  Patient presents with  . Allergic Reaction    hospitalized    [name] has a history of the following: Patient Active Problem List   Diagnosis Date Noted  . Tobacco abuse 12/20/2017  . Hyperlipidemia 01/30/2017  . 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 Seasonal and perennial allergic rhinitis 12/25/2016  . Chronic idiopathic urticaria 12/25/2016  . Interstitial cystitis 05/07/2016  . Chronic fatigue 10/12/2015  . Chronic idiopathic constipation 05/20/2015  . 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 B12 deficiency 07/13/2013  . Drug withdrawal ([place]) 04/30/2013  . Symptoms  concerning nutrition, metabolism, and development 04/24/2013  . Nausea with vomiting 04/15/2013  . 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 Polycystic ovaries 03/25/2013  . Fatty liver 02/19/2013  . Venous stasis 02/19/2013  . Peripheral edema 01/06/2013  . Chronic anticoagulation 12/30/2012  . Migraine headache 12/24/2012  . Chronic pain syndrome 09/09/2012  . 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  Esophageal reflux 08/19/2012  . Vitamin D deficiency 07/11/2012  . Insomnia secondary to depression with anxiety 07/11/2012  . Nausea alone 06/11/2012  . 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 Hypokalemia 02/27/2012  . 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 Essential hypertension 01/18/2012  . Hypomagnesemia 01/18/2012  . OCD (obsessive compulsive disorder) 01/03/2012    Class: Chronic  . Depression 01/01/2012  . Anemia 10/01/2011  . Anxiety state 08/24/2011  . Obstructive sleep apnea 08/24/2011  . Abdominal pain 08/16/2011  . Endocrine disorder 05/21/2011  . Dyspepsia and disorder of function of stomach 03/21/2011  . Chest pain, unspecified 12/13/2010  . INTESTINAL MALABSORPTION, POSTSURGICAL 07/24/2010  . DIARRHEA 07/24/2010  . ABDOMINAL PAIN-PERIUMBILICAL 85/07/7739    History obtained from: chart review and patient.  Sammuel Bailiff Primary Care Provider is Mikey Kirschner, MD.     Bariatric surgeon: Dr. Lucien Mons Hematologist: Dr. Rockney Ghee and NP Myrtie Cruise Pain management: Dr. Agustina Caroli Psychiatrist: Dr. Mickie Hillier  [name] is a 37 y.o. female presenting for a follow up visit.  She was last seen by me in September 2018.  She has a complex medical history including chronic urticaria as well as seasonal and perennial allergic rhinitis.  For her hives, we added on Eucrisa at the last visit.  We continued her on Xolair which was started in August 2018.  We also continued Xyzal 10 mg in the morning and Zyrtec 20 mg at night in conjunction with Zantac 300 mg at night.  I did not recommend starting allergy shots since I was not convinced that they had anything to do with her reactions.  She did undergo patch testing which was rather unrevealing (mild reaction to gold and disperse blue).   Since the last visit, she has had a rather complicated course. She has had several J tubes  since April 2019. She has had infusion reactions to iron. She has had reactions to multiple tapes. She was in the hospital for a number of months on and off.  Review of her discharge summary from June 2019 shows that there was a multidisciplinary discussion bring up the fact that many of her problems could be psychologic in nature.  Evidently, she was able to acknowledge this fact.  Their plan included avoiding opiates as well as antiemetics, which did not seem to be helping her symptoms.  She also has a history of anemia and is followed by hematologist at [facility].  She does require IV infusions.  Evidently, she had a reaction to the iron infusion which was manifested by itching around the site of her central line.  She had hives over her entire face and she felt that her face was covered with bugs.  On Friday night she had an iron infusion. She started having itching around the iste of her line. She has had hives over her entire face with eye lids with "bugs". She has not been taking any of her antihistamines in quite some time and clearly is uncomfortable. She is motivated to get back onto the Xolair, which  was helping her symptoms tremendously.   Otherwise, there have been no changes to her past medical history, surgical history, family history, or social history.    Review of Systems: a 14-point review of systems is pertinent for what is mentioned in HPI.  Otherwise, all other systems were negative. Constitutional: negative other than that listed in the HPI Eyes: negative other than that listed in the HPI Ears, nose, mouth, throat, and face: negative other than that listed in the HPI Respiratory: negative other than that listed in the HPI Cardiovascular: negative other than that listed in the HPI Gastrointestinal: negative other than that listed in the HPI Genitourinary: negative other than that listed in the HPI Integument: negative other than that listed in the HPI Hematologic:  negative other than that listed in the HPI Musculoskeletal: negative other than that listed in the HPI Neurological: negative other than that listed in the HPI Allergy/Immunologic: negative other than that listed in the HPI    Objective:   Blood pressure 102/60, pulse 74, resp. rate (!) 74, SpO2 94 %. There is no height or weight on file to calculate BMI.   Physical Exam:  General: Alert, interactive, in no acute distress. Talkative. Seems somewhat more focused that she has on previous visits.  Eyes: No conjunctival injection bilaterally, no discharge on the right, no discharge on the left, no Horner-Trantas dots present and allergic shiners present bilaterally. PERRL bilaterally. EOMI without pain. No photophobia.  Ears: Right TM pearly gray with normal light reflex, Left TM pearly gray with normal light reflex, Right TM intact without perforation and Left TM intact without perforation.  Nose/Throat: External nose within normal limits and septum midline. Turbinates edematous with clear discharge. Posterior oropharynx erythematous without cobblestoning in the posterior oropharynx. Tonsils 2+ without exudates.  Tongue without thrush. Lungs: Clear to auscultation without wheezing, rhonchi or rales. No increased work of breathing. CV: Normal S1/S2. No murmurs. Capillary refill <2 seconds.  Skin: Warm and dry, without lesions or rashes. Neuro:   Grossly intact. No focal deficits appreciated. Responsive to questions.  Diagnostic studies:   Spirometry: results normal (FEV1: 3.16/106%, FVC: 3.74/104%, FEV1/FVC: 84%).    Spirometry consistent with normal pattern.   Allergy Studies: none      Salvatore Marvel, MD  [facility] [place] [facility] [place]

## 2017-12-30 NOTE — Patient Instructions (Addendum)
1. Chronic idiopathic urticaria - planning to re-initiate Xolair - In the meantime, start suppressive dosing of antihistamines:   - Morning: Xyzal (levocetirizine) 10mg  (two tablets)   - Evening: Zyrtec (cetirizine) 20mg  (two tablets) + Zantac (ranitidine) 300mg  - If you have breakthrough rashes/hives, take cetirizine 40mg  (four tablets) to suppress the reaction.  2. Perennial and seasonal allergic rhinitis (cats, dogs, grasses, trees, weeds) - I am still not convinced that the allergy shots would be helpful.  - Continue to monitor exposures to see what might be triggering these reactions.  - We will add on an eye drop as well: Pataday one drop per eye twice daily. - We will give one dose of DepoMedrol 80mg .   3. Contact dermatitis - Continue with Eucrisa one application twice daily.  - Continue with moisturizing with your Clinique with sunscreen.   4. Return in about 3 months (around 04/01/2018).  Please inform us of any Emergency Department visits, hospitalizations, or changes in symptoms. Call us before going to the ED for breathing or allergy symptoms since we might be able to fit you in for a sick visit. Feel free to contact us anytime with any questions, problems, or concerns.  It was a pleasure to see you again today!  Websites that have reliable patient information: 1. American Academy of Asthma, Allergy, and Immunology: www.aaaai.org 2. Food Allergy Research and Education (FARE): foodallergy.org 3. Mothers of Asthmatics: http://www.asthmacommunitynetwork.org 4. American College of Allergy, Asthma, and Immunology: MonthlyElectricBill.co.uk   Make sure you are registered to vote! If you have moved or changed any of your contact information, you will need to get this updated before voting!        Allergy Shots   Allergies are the result of a chain reaction that starts in the immune system. Your immune system controls how your body defends itself. For instance, if you have an allergy  to pollen, your immune system identifies pollen as an invader or allergen. Your immune system overreacts by producing antibodies called Immunoglobulin E (IgE). These antibodies travel to cells that release chemicals, causing an allergic reaction.  The concept behind allergy immunotherapy, whether it is received in the form of shots or tablets, is that the immune system can be desensitized to specific allergens that trigger allergy symptoms. Although it requires time and patience, the payback can be long-term relief.  How Do Allergy Shots Work?  Allergy shots work much like a vaccine. Your body responds to injected amounts of a particular allergen given in increasing doses, eventually developing a resistance and tolerance to it. Allergy shots can lead to decreased, minimal or no allergy symptoms.  There generally are two phases: build-up and maintenance. Build-up often ranges from three to six months and involves receiving injections with increasing amounts of the allergens. The shots are typically given once or twice a week, though more rapid build-up schedules are sometimes used.  The maintenance phase begins when the most effective dose is reached. This dose is different for each person, depending on how allergic you are and your response to the build-up injections. Once the maintenance dose is reached, there are longer periods between injections, typically two to four weeks.  Occasionally doctors give cortisone-type shots that can temporarily reduce allergy symptoms. These types of shots are different and should not be confused with allergy immunotherapy shots.  Who Can Be Treated with Allergy Shots?  Allergy shots may be a good treatment approach for people with allergic rhinitis (hay fever), allergic asthma, conjunctivitis (eye allergy)  or stinging insect allergy.   Before deciding to begin allergy shots, you should consider:  . The length of allergy season and the severity of your  symptoms . Whether medications and/or changes to your environment can control your symptoms . Your desire to avoid long-term medication use . Time: allergy immunotherapy requires a major time commitment . Cost: may vary depending on your insurance coverage  Allergy shots for children age 68 and older are effective and often well tolerated. They might prevent the onset of new allergen sensitivities or the progression to asthma.  Allergy shots are not started on patients who are pregnant but can be continued on patients who become pregnant while receiving them. In some patients with other medical conditions or who take certain common medications, allergy shots may be of risk. It is important to mention other medications you talk to your allergist.   When Will I Feel Better?  Some may experience decreased allergy symptoms during the build-up phase. For others, it may take as long as 12 months on the maintenance dose. If there is no improvement after a year of maintenance, your allergist will discuss other treatment options with you.  If you aren't responding to allergy shots, it may be because there is not enough dose of the allergen in your vaccine or there are missing allergens that were not identified during your allergy testing. Other reasons could be that there are high levels of the allergen in your environment or major exposure to non-allergic triggers like tobacco smoke.  What Is the Length of Treatment?  Once the maintenance dose is reached, allergy shots are generally continued for three to five years. The decision to stop should be discussed with your allergist at that time. Some people may experience a permanent reduction of allergy symptoms. Others may relapse and a longer course of allergy shots can be considered.  What Are the Possible Reactions?  The two types of adverse reactions that can occur with allergy shots are local and systemic. Common local reactions include very mild  redness and swelling at the injection site, which can happen immediately or several hours after. A systemic reaction, which is less common, affects the entire body or a particular body system. They are usually mild and typically respond quickly to medications. Signs include increased allergy symptoms such as sneezing, a stuffy nose or hives.  Rarely, a serious systemic reaction called anaphylaxis can develop. Symptoms include swelling in the throat, wheezing, a feeling of tightness in the chest, nausea or dizziness. Most serious systemic reactions develop within 30 minutes of allergy shots. This is why it is strongly recommended you wait in your doctor's office for 30 minutes after your injections. Your allergist is trained to watch for reactions, and his or her staff is trained and equipped with the proper medications to identify and treat them.  Who Should Administer Allergy Shots?  The preferred location for receiving shots is your prescribing allergist's office. Injections can sometimes be given at another facility where the physician and staff are trained to recognize and treat reactions, and have received instructions by your prescribing allergist.

## 2017-12-31 ENCOUNTER — Other Ambulatory Visit: Payer: Self-pay | Admitting: Allergy

## 2017-12-31 ENCOUNTER — Telehealth: Payer: Self-pay

## 2017-12-31 ENCOUNTER — Encounter: Payer: Self-pay | Admitting: Allergy & Immunology

## 2017-12-31 MED ORDER — ALBUTEROL SULFATE HFA 108 (90 BASE) MCG/ACT IN AERS: 2.0000 | INHALATION_SPRAY | Freq: Four times a day (QID) | RESPIRATORY_TRACT | 0 refills | Status: DC | PRN

## 2017-12-31 MED ORDER — MONTELUKAST SODIUM 10 MG PO TABS: 10.0000 mg | ORAL_TABLET | Freq: Every day | ORAL | 5 refills | Status: DC

## 2017-12-31 MED ORDER — CETIRIZINE HCL 10 MG PO TABS: 20.0000 mg | ORAL_TABLET | Freq: Every day | ORAL | 5 refills | Status: DC

## 2017-12-31 MED ORDER — CRISABOROLE 2 % EX OINT: 1.0000 "application " | TOPICAL_OINTMENT | Freq: Two times a day (BID) | CUTANEOUS | 2 refills | Status: AC | PRN

## 2017-12-31 MED ORDER — RANITIDINE HCL 300 MG PO TABS: 300.0000 mg | ORAL_TABLET | Freq: Every day | ORAL | 5 refills | Status: DC

## 2017-12-31 MED ORDER — OLOPATADINE HCL 0.2 % OP SOLN: 1.0000 [drp] | Freq: Every day | OPHTHALMIC | 5 refills | Status: DC

## 2017-12-31 MED ORDER — FLUTICASONE PROPIONATE HFA 110 MCG/ACT IN AERO: 2.0000 | INHALATION_SPRAY | Freq: Two times a day (BID) | RESPIRATORY_TRACT | 5 refills | Status: DC

## 2017-12-31 MED ORDER — LEVOCETIRIZINE DIHYDROCHLORIDE 5 MG PO TABS: 10.0000 mg | ORAL_TABLET | Freq: Every day | ORAL | 5 refills | Status: DC

## 2017-12-31 MED ORDER — CRISABOROLE 2 % EX OINT: 1.0000 "application " | TOPICAL_OINTMENT | Freq: Two times a day (BID) | CUTANEOUS | 2 refills | Status: DC | PRN

## 2017-12-31 MED ORDER — EPINEPHRINE 0.3 MG/0.3ML IJ SOAJ: 0.3000 mg | Freq: Once | INTRAMUSCULAR | 1 refills | Status: DC | PRN

## 2017-12-31 MED ORDER — TRIAMCINOLONE ACETONIDE 0.025 % EX OINT: TOPICAL_OINTMENT | Freq: Two times a day (BID) | CUTANEOUS | 2 refills | Status: AC | PRN

## 2017-12-31 NOTE — Telephone Encounter (Signed)
Prescriptions have been sent to requested pharmacy.

## 2017-12-31 NOTE — Telephone Encounter (Signed)
Patient is requesting scripts to be sent  In for pataday , epipen and zyrtec.  [facility] Please Advise.

## 2018-01-01 ENCOUNTER — Telehealth: Payer: Self-pay | Admitting: Allergy

## 2018-01-01 ENCOUNTER — Encounter: Payer: Self-pay | Admitting: Allergy & Immunology

## 2018-01-01 ENCOUNTER — Other Ambulatory Visit: Payer: Self-pay | Admitting: Advanced Practice Midwife

## 2018-01-01 DIAGNOSIS — K228 Other specified diseases of esophagus: Secondary | ICD-10-CM | POA: Diagnosis not present

## 2018-01-01 DIAGNOSIS — K219 Gastro-esophageal reflux disease without esophagitis: Secondary | ICD-10-CM | POA: Diagnosis not present

## 2018-01-01 DIAGNOSIS — K9413 Enterostomy malfunction: Secondary | ICD-10-CM | POA: Diagnosis not present

## 2018-01-01 DIAGNOSIS — F329 Major depressive disorder, single episode, unspecified: Secondary | ICD-10-CM | POA: Diagnosis not present

## 2018-01-01 DIAGNOSIS — K449 Diaphragmatic hernia without obstruction or gangrene: Secondary | ICD-10-CM | POA: Diagnosis not present

## 2018-01-01 DIAGNOSIS — F419 Anxiety disorder, unspecified: Secondary | ICD-10-CM | POA: Diagnosis not present

## 2018-01-01 MED ORDER — FLUCONAZOLE 150 MG PO TABS: 150.0000 mg | ORAL_TABLET | Freq: Once | ORAL | 2 refills | Status: AC

## 2018-01-01 NOTE — Telephone Encounter (Signed)
Informed patient that the Georga Hacking has been approved.

## 2018-01-06 ENCOUNTER — Ambulatory Visit (INDEPENDENT_AMBULATORY_CARE_PROVIDER_SITE_OTHER): Payer: Medicare Other | Admitting: Family Medicine

## 2018-01-06 ENCOUNTER — Telehealth: Payer: Self-pay

## 2018-01-06 ENCOUNTER — Encounter: Payer: Self-pay | Admitting: Family Medicine

## 2018-01-06 VITALS — BP 120/82 | Temp 98.6°F | Ht 64.0 in | Wt 164.0 lb

## 2018-01-06 DIAGNOSIS — N3000 Acute cystitis without hematuria: Secondary | ICD-10-CM | POA: Diagnosis not present

## 2018-01-06 DIAGNOSIS — M545 Low back pain: Secondary | ICD-10-CM | POA: Diagnosis not present

## 2018-01-06 DIAGNOSIS — M7918 Myalgia, other site: Secondary | ICD-10-CM | POA: Diagnosis not present

## 2018-01-06 DIAGNOSIS — R300 Dysuria: Secondary | ICD-10-CM | POA: Diagnosis not present

## 2018-01-06 DIAGNOSIS — G8929 Other chronic pain: Secondary | ICD-10-CM | POA: Diagnosis not present

## 2018-01-06 LAB — POCT URINALYSIS DIPSTICK
Blood, UA: NEGATIVE
Spec Grav, UA: 1.02 (ref 1.010–1.025)
pH, UA: 5 (ref 5.0–8.0)

## 2018-01-06 MED ORDER — CIPROFLOXACIN HCL 250 MG PO TABS: 250.0000 mg | ORAL_TABLET | Freq: Two times a day (BID) | ORAL | 0 refills | Status: AC

## 2018-01-06 MED ORDER — FLUCONAZOLE 150 MG PO TABS: ORAL_TABLET | ORAL | 0 refills | Status: DC

## 2018-01-06 NOTE — Progress Notes (Signed)
Subjective:    Patient ID: [name], female    DOB: Feb 24, 1981, 37 y.o.   MRN: [id number]  HPI Patient is here today due to having symptoms of a uti frequency,dripping urine,burning and abd pain also having migraines ongoing since Saturday. She has taken azo,excedrine,4 rounds of diflucan.   Review of Systems Results for orders placed or performed in visit on 01/06/18  POCT urinalysis dipstick  Result Value Ref Range   Color, UA     Clarity, UA     Glucose, UA  Negative   Bilirubin, UA     Ketones, UA     Spec Grav, UA 1.020 1.010 - 1.025   Blood, UA Negative    pH, UA 5.0 5.0 - 8.0   Protein, UA  Negative   Urobilinogen, UA  0.2 or 1.0 E.U./dL   Nitrite, UA     Leukocytes, UA Small (1+) (A) Negative   Appearance     Odor     Pt has had couple uti s past couple mo, one came after a catherterization   [facility]es [facility] or [facility], on gabpentin, also working with [facility] on biofeedback and hea;thy sep habits   Has hx of migr h a 's oin the past two weeks  No headache, no major weight loss or weight gain, no chest pain no back pain abdominal pain no change in bowel habits complete ROS otherwise negative     Objective:   Physical Exam  Alert vitals stable, NAD. Blood pressure good on repeat. HEENT normal. Lungs clear. Heart regular rate and rhythm. Possibly slight left CVA tenderness.  Urinalysis numerous white blood cells are filled rare epithelial cell    Assessment & Plan:  Impression urinary tract infection.  Recurrent.  Culture urine.  Cipro 250 twice daily 7 days.  Symptom care discussed warning signs discussed

## 2018-01-06 NOTE — Telephone Encounter (Signed)
Patient sent email asking for refill on epi pen due to hers about to expire. Epi pen was sent on 12/31/2017 to [facility]. I emailed patient back to have her check to see if there is a script there. If not I will send it in.

## 2018-01-08 ENCOUNTER — Telehealth: Payer: Self-pay | Admitting: *Deleted

## 2018-01-08 DIAGNOSIS — F4310 Post-traumatic stress disorder, unspecified: Secondary | ICD-10-CM | POA: Diagnosis not present

## 2018-01-08 DIAGNOSIS — M791 Myalgia, unspecified site: Secondary | ICD-10-CM | POA: Diagnosis not present

## 2018-01-08 DIAGNOSIS — F3132 Bipolar disorder, current episode depressed, moderate: Secondary | ICD-10-CM | POA: Diagnosis not present

## 2018-01-08 DIAGNOSIS — R1013 Epigastric pain: Secondary | ICD-10-CM | POA: Diagnosis not present

## 2018-01-08 DIAGNOSIS — G894 Chronic pain syndrome: Secondary | ICD-10-CM | POA: Diagnosis not present

## 2018-01-08 DIAGNOSIS — M792 Neuralgia and neuritis, unspecified: Secondary | ICD-10-CM | POA: Diagnosis not present

## 2018-01-08 LAB — SPECIMEN STATUS REPORT

## 2018-01-08 LAB — URINE CULTURE

## 2018-01-08 NOTE — Telephone Encounter (Signed)
Late entry insurance called 01/07/18 asking why is patient on high dose of levocetirizine advised that she has idiopathic urticaria and needs to be on suppressive antihistamines per Dr Gillermina Hu last note. States that they are going to put his information in the system to see if it gets approved

## 2018-01-09 DIAGNOSIS — K9413 Enterostomy malfunction: Secondary | ICD-10-CM | POA: Diagnosis not present

## 2018-01-09 DIAGNOSIS — F419 Anxiety disorder, unspecified: Secondary | ICD-10-CM | POA: Diagnosis not present

## 2018-01-09 DIAGNOSIS — F329 Major depressive disorder, single episode, unspecified: Secondary | ICD-10-CM | POA: Diagnosis not present

## 2018-01-09 DIAGNOSIS — K449 Diaphragmatic hernia without obstruction or gangrene: Secondary | ICD-10-CM | POA: Diagnosis not present

## 2018-01-09 DIAGNOSIS — K219 Gastro-esophageal reflux disease without esophagitis: Secondary | ICD-10-CM | POA: Diagnosis not present

## 2018-01-09 DIAGNOSIS — K228 Other specified diseases of esophagus: Secondary | ICD-10-CM | POA: Diagnosis not present

## 2018-01-09 NOTE — Telephone Encounter (Signed)
Insurance called office and spoke to Charlton. Miranda, LPN. They have info needed.

## 2018-01-10 ENCOUNTER — Other Ambulatory Visit: Payer: Self-pay | Admitting: Surgery

## 2018-01-10 DIAGNOSIS — M7918 Myalgia, other site: Secondary | ICD-10-CM

## 2018-01-14 DIAGNOSIS — M546 Pain in thoracic spine: Secondary | ICD-10-CM | POA: Diagnosis not present

## 2018-01-14 DIAGNOSIS — S134XXA Sprain of ligaments of cervical spine, initial encounter: Secondary | ICD-10-CM | POA: Diagnosis not present

## 2018-01-14 DIAGNOSIS — M9903 Segmental and somatic dysfunction of lumbar region: Secondary | ICD-10-CM | POA: Diagnosis not present

## 2018-01-14 DIAGNOSIS — M9902 Segmental and somatic dysfunction of thoracic region: Secondary | ICD-10-CM | POA: Diagnosis not present

## 2018-01-14 DIAGNOSIS — M9901 Segmental and somatic dysfunction of cervical region: Secondary | ICD-10-CM | POA: Diagnosis not present

## 2018-01-14 DIAGNOSIS — S335XXA Sprain of ligaments of lumbar spine, initial encounter: Secondary | ICD-10-CM | POA: Diagnosis not present

## 2018-01-16 DIAGNOSIS — S335XXA Sprain of ligaments of lumbar spine, initial encounter: Secondary | ICD-10-CM | POA: Diagnosis not present

## 2018-01-16 DIAGNOSIS — M9903 Segmental and somatic dysfunction of lumbar region: Secondary | ICD-10-CM | POA: Diagnosis not present

## 2018-01-16 DIAGNOSIS — M546 Pain in thoracic spine: Secondary | ICD-10-CM | POA: Diagnosis not present

## 2018-01-16 DIAGNOSIS — M9901 Segmental and somatic dysfunction of cervical region: Secondary | ICD-10-CM | POA: Diagnosis not present

## 2018-01-16 DIAGNOSIS — S134XXA Sprain of ligaments of cervical spine, initial encounter: Secondary | ICD-10-CM | POA: Diagnosis not present

## 2018-01-16 DIAGNOSIS — M9902 Segmental and somatic dysfunction of thoracic region: Secondary | ICD-10-CM | POA: Diagnosis not present

## 2018-01-17 DIAGNOSIS — K9413 Enterostomy malfunction: Secondary | ICD-10-CM | POA: Diagnosis not present

## 2018-01-17 DIAGNOSIS — F329 Major depressive disorder, single episode, unspecified: Secondary | ICD-10-CM | POA: Diagnosis not present

## 2018-01-17 DIAGNOSIS — K228 Other specified diseases of esophagus: Secondary | ICD-10-CM | POA: Diagnosis not present

## 2018-01-17 DIAGNOSIS — F419 Anxiety disorder, unspecified: Secondary | ICD-10-CM | POA: Diagnosis not present

## 2018-01-17 DIAGNOSIS — K219 Gastro-esophageal reflux disease without esophagitis: Secondary | ICD-10-CM | POA: Diagnosis not present

## 2018-01-17 DIAGNOSIS — K449 Diaphragmatic hernia without obstruction or gangrene: Secondary | ICD-10-CM | POA: Diagnosis not present

## 2018-01-23 ENCOUNTER — Ambulatory Visit
Admission: RE | Admit: 2018-01-23 | Discharge: 2018-01-23 | Disposition: A | Discharge status: Home / Self Care | Payer: Medicare Other | Source: Ambulatory Visit | Attending: Surgery | Admitting: Surgery

## 2018-01-23 DIAGNOSIS — R222 Localized swelling, mass and lump, trunk: Secondary | ICD-10-CM | POA: Diagnosis not present

## 2018-01-23 DIAGNOSIS — M7918 Myalgia, other site: Secondary | ICD-10-CM

## 2018-01-23 MED ORDER — GADOBENATE DIMEGLUMINE 529 MG/ML IV SOLN
15.0000 mL | Freq: Once | INTRAVENOUS | Status: AC | PRN
  Administered 2018-01-23: 15 mL via INTRAVENOUS

## 2018-01-28 ENCOUNTER — Ambulatory Visit: Payer: Self-pay

## 2018-01-28 ENCOUNTER — Ambulatory Visit (INDEPENDENT_AMBULATORY_CARE_PROVIDER_SITE_OTHER): Payer: Medicare Other | Admitting: Allergy & Immunology

## 2018-01-28 ENCOUNTER — Encounter: Payer: Self-pay | Admitting: Allergy & Immunology

## 2018-01-28 VITALS — BP 116/70 | HR 92 | Resp 18

## 2018-01-28 DIAGNOSIS — D509 Iron deficiency anemia, unspecified: Secondary | ICD-10-CM | POA: Diagnosis not present

## 2018-01-28 DIAGNOSIS — T782XXD Anaphylactic shock, unspecified, subsequent encounter: Secondary | ICD-10-CM

## 2018-01-28 DIAGNOSIS — J3089 Other allergic rhinitis: Secondary | ICD-10-CM

## 2018-01-28 DIAGNOSIS — F4310 Post-traumatic stress disorder, unspecified: Secondary | ICD-10-CM | POA: Diagnosis not present

## 2018-01-28 DIAGNOSIS — J302 Other seasonal allergic rhinitis: Secondary | ICD-10-CM

## 2018-01-28 DIAGNOSIS — L501 Idiopathic urticaria: Secondary | ICD-10-CM | POA: Diagnosis not present

## 2018-01-28 DIAGNOSIS — G43009 Migraine without aura, not intractable, without status migrainosus: Secondary | ICD-10-CM | POA: Diagnosis not present

## 2018-01-28 DIAGNOSIS — L239 Allergic contact dermatitis, unspecified cause: Secondary | ICD-10-CM | POA: Diagnosis not present

## 2018-01-28 DIAGNOSIS — F3132 Bipolar disorder, current episode depressed, moderate: Secondary | ICD-10-CM | POA: Diagnosis not present

## 2018-01-28 DIAGNOSIS — E348 Other specified endocrine disorders: Secondary | ICD-10-CM | POA: Diagnosis not present

## 2018-01-28 DIAGNOSIS — R51 Headache: Secondary | ICD-10-CM | POA: Diagnosis not present

## 2018-01-28 NOTE — Patient Instructions (Addendum)
1. Chronic idiopathic urticaria - on Xolair  - Continue with suppressive dosing of antihistamines:   - Morning: Xyzal (levocetirizine) 10mg  (two tablets)   - Midday: Allegra (fexofenadine) 180mg  - 360mg  (one to two tablets)  - Evening: Zyrtec (cetirizine) 20mg  (two tablets) + Zantac (ranitidine) 300mg  + Doxepin  10mg   - If you have breakthrough rashes/hives, take cetirizine 40mg  (four tablets) to suppress the reaction. - Add on triamcinolone ointment twice daily as needed to the rash.   2. Perennial and seasonal allergic rhinitis (cats, dogs, grasses, trees, weeds) - Allergy shot consent signed today.  - I would like to hold off for two months until your Xolair is fully on board. - Continue with eye drops: Pataday one drop per eye twice daily.  3. Contact dermatitis - Stop the Eucrisa and start Elidel twice daily as needed (safe to use on the face).  - Continue with moisturizing with your Clinique with sunscreen.   4. Return in about 3 months (around 04/30/2018).   Please inform us of any Emergency Department visits, hospitalizations, or changes in symptoms. Call us before going to the ED for breathing or allergy symptoms since we might be able to fit you in for a sick visit. Feel free to contact us anytime with any questions, problems, or concerns.  It was a pleasure to see you again today!  Websites that have reliable patient information: 1. American Academy of Asthma, Allergy, and Immunology: www.aaaai.org 2. Food Allergy Research and Education (FARE): foodallergy.org 3. Mothers of Asthmatics: http://www.asthmacommunitynetwork.org 4. American College of Allergy, Asthma, and Immunology: MonthlyElectricBill.co.uk   Make sure you are registered to vote! If you have moved or changed any of your contact information, you will need to get this updated before voting!

## 2018-01-28 NOTE — Progress Notes (Signed)
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 She reports continued pruritis despite her antihistamines. She missed her last dose of Xolair (most recent injection was due 01/27/18). It is hard to get a sense of whether the Xolair has helped her itching, as in the past she has not been on it long enough to determine whether this is beneficial. However, I do not think we will be able to increase the dosing to every two weeks until she has received three monthly injections without improvement in her clinical status. In the interim, we will add an extra antihistamine at night in the form of doxepin 10mg  as well as Allegra 1 to 2 tablets mid day to see if this helps. I think it would be helpful in the future to refer to Dermatology for an evaluation in case the rash is actually not hives as we think and something entirely different. But antihistamines have helped in the past with quick resolution of the rash, which is consistent with urticaria.   She also remains interested in allergen immunotherapy, and although I think it could be beneficial in the very long term, at this point I do not think that the initiation of allergen immunotherapy will be immediately helpful as Ms. Lorin Mercy seems to think. It would be safer for her to get Xolair on board for several doses in a row, as Xolair can act as an immunomodulator and decrease the incidence of anaphylaxis in allergen immunotherapy.   Plan/Recommendations:   1. Chronic idiopathic  urticaria - on Xolair  - Continue with suppressive dosing of antihistamines:   - Morning: Xyzal (levocetirizine) 10mg  (two tablets)   - Midday: Allegra (fexofenadine) 180mg  - 360mg  (one to two tablets)  - Evening: Zyrtec (cetirizine) 20mg  (two tablets) + Zantac (ranitidine) 300mg  + Doxepin  10mg   - If you have breakthrough rashes/hives, take cetirizine 40mg  (four tablets) to suppress the reaction. - Add on triamcinolone ointment twice daily as needed to the rash.   2. Perennial and seasonal allergic rhinitis (cats, dogs, grasses, trees, weeds) - Allergy shot consent signed today.  - I would like to hold off for two months until your Xolair is fully on board. - Continue with eye drops: Pataday one drop per eye twice daily.  3. Contact dermatitis - Stop the Eucrisa and start Elidel twice daily as needed (safe to use on the face).  - Continue with moisturizing with your Clinique with sunscreen.   4. Return in about 3 months (around 04/30/2018).  Subjective:   ARMONEE BOJANOWSKI is a 37 y.o. female presenting today for follow up of  Chief Complaint  Patient presents with  . Allergic Reaction    RYIAH BELLISSIMO has a history of the following: Patient Active Problem List   Diagnosis Date Noted  . Tobacco abuse 12/20/2017  . Hyperlipidemia 01/30/2017  . 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 Seasonal and perennial allergic rhinitis 12/25/2016  . Chronic idiopathic urticaria 12/25/2016  . Interstitial cystitis 05/07/2016  . Chronic fatigue 10/12/2015  . Chronic idiopathic constipation 05/20/2015  .  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 B12 deficiency 07/13/2013  . Drug withdrawal ([place]) 04/30/2013  . Symptoms concerning nutrition, metabolism, and development 04/24/2013  . Nausea with vomiting 04/15/2013  . 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 Polycystic ovaries 03/25/2013  . Fatty liver 02/19/2013  . Venous stasis 02/19/2013  .  Peripheral edema 01/06/2013  . Chronic anticoagulation 12/30/2012  . Migraine headache 12/24/2012  . Chronic pain syndrome 09/09/2012  . 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 Esophageal reflux 08/19/2012  . Vitamin D deficiency 07/11/2012  . Insomnia secondary to depression with anxiety 07/11/2012  . Nausea alone 06/11/2012  . 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 Hypokalemia 02/27/2012  . 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 Essential hypertension 01/18/2012  . Hypomagnesemia 01/18/2012  . OCD (obsessive compulsive disorder) 01/03/2012    Class: Chronic  . Depression 01/01/2012  . Anemia 10/01/2011  . Anxiety state 08/24/2011  . Obstructive sleep apnea 08/24/2011  . Abdominal pain 08/16/2011  . Endocrine disorder 05/21/2011  . Dyspepsia and disorder of function of stomach 03/21/2011  . Chest pain, unspecified 12/13/2010  . INTESTINAL MALABSORPTION, POSTSURGICAL 07/24/2010  . DIARRHEA 07/24/2010  . ABDOMINAL PAIN-PERIUMBILICAL 30/02/2329    History obtained from: chart review and patient.  Sammuel Bailiff Primary Care Provider is Mikey Kirschner, MD.     Bariatric Surgeon: Dr. Lucien Mons Hematologist: Dr. Rockney Ghee and NP Myrtie Cruise Pain Management: Dr. Agustina Caroli Psychiatrist: Dr. Mickie Hillier  Dalena is a 37 y.o. female presenting for a sick visit. She was last seen to re-establish care one month ago. At that visit, we restarted her antihistamines back at Xyzal two tablets in the morning and Zyrtec two tablets at night. We did provide a dose of DepoMedrol to help with her worsening pruritis. Prior to that, she was last seen by me in September 2018.  She has a complex medical history including chronic urticaria as well as seasonal and perennial allergic rhinitis.  For her hives, we added on Eucrisa at the last visit.  We continued her on Xolair which was started in August 2018.   We also continued Xyzal 10 mg in the morning and Zyrtec 20 mg at night in conjunction with Zantac 300 mg at night.  I did not recommend starting allergy shots since I was not convinced that they had anything to do with her reactions.  She did undergo patch testing which was rather unrevealing (mild reaction to gold and disperse blue).   Since the last visit, she has had more outbreaks. She went to [facility] today and had several appointments. During one of these visits, her port was accessed and she broke out to the ethanol wipes used to sterilize her port site. She shows me multiples flat blanching rashes on her chest today. She also developed some itching over both of her arms with more blanching confluent rashes. She is not having any of the shortness of breath or systemic symptoms with these rashes today.   She does not use the [place] any longer because it made her face burn. We had talked about putting it in the fridge and using cold compresses. She is not interested in trying this once again. She is using her Xyzal and the Zyrtec, but she continues to have problems with itching. She has not tried using Allegra. She remains interested in [place], but unfortunately we checked and her Xolair is at the [place] office.  She remains interested in allergen immunotherapy and would like to talk more about it today.   She saw Neurology yesterday and she was prescribed Imitrex to use as needed for headaches. She decided not to go on a daily prophylactic medication. 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   Otherwise, there have been no changes to her past medical history, surgical history, family history, or social history.    Review of Systems: a 14-point review of systems is pertinent for what is mentioned in HPI.  Otherwise, all other systems were negative. Constitutional: negative other than that listed in the HPI Eyes: negative other than that listed in the HPI Ears, nose, mouth, throat, and face:  negative other than that listed in the HPI Respiratory: negative other than that listed in the HPI Cardiovascular: negative other than that listed in the HPI Gastrointestinal: negative other than that listed in the HPI Genitourinary: negative other than that listed in the HPI Integument: negative other than that listed in the HPI Hematologic: negative other than that listed in the HPI Musculoskeletal: negative other than that listed in the HPI Neurological: negative other than that listed in the HPI Allergy/Immunologic: negative other than that listed in the HPI    Objective:   Blood pressure 116/70, pulse 92, resp. rate 18, SpO2 97 %. There is no height or weight on file to calculate BMI.   Physical Exam:  General: Alert, interactive, in no acute distress. Scratching.  Eyes: No conjunctival injection bilaterally, no discharge on the right, no discharge on the left and no Horner-Trantas dots present. PERRL bilaterally. EOMI without pain. No photophobia.  Ears: Right TM pearly gray with normal light reflex, Left TM pearly gray with normal light reflex, Right TM intact without perforation and Left TM intact without perforation.  Nose/Throat: External nose within normal limits and septum midline. Turbinates edematous and pale with clear discharge. Posterior oropharynx erythematous with cobblestoning in the posterior oropharynx. Tonsils 2+ without exudates.  Tongue without thrush. Lungs: Clear to auscultation without wheezing, rhonchi or rales. No increased work of breathing. CV: Normal S1/S2. No murmurs. Capillary refill <2 seconds.  Skin: Scattered erythematous urticarial type lesions primarily located bilateral arms as well as the upper anterior chest , nonvesicular. Neuro:   Grossly intact. No focal deficits appreciated. Responsive to questions.  Diagnostic studies: none     Salvatore Marvel, MD  [facility] [place] [facility] [place]

## 2018-01-29 ENCOUNTER — Other Ambulatory Visit: Payer: Self-pay | Admitting: *Deleted

## 2018-01-29 ENCOUNTER — Telehealth: Payer: Self-pay | Admitting: Allergy & Immunology

## 2018-01-29 ENCOUNTER — Telehealth: Payer: Self-pay

## 2018-01-29 ENCOUNTER — Encounter: Payer: Self-pay | Admitting: Allergy & Immunology

## 2018-01-29 MED ORDER — PIMECROLIMUS 1 % EX CREA: TOPICAL_CREAM | Freq: Two times a day (BID) | CUTANEOUS | 0 refills | Status: DC | PRN

## 2018-01-29 MED ORDER — DOXEPIN HCL 10 MG PO CAPS: 10.0000 mg | ORAL_CAPSULE | Freq: Every evening | ORAL | 5 refills | Status: DC

## 2018-01-29 MED ORDER — PIMECROLIMUS 1 % EX CREA: TOPICAL_CREAM | Freq: Two times a day (BID) | CUTANEOUS | 5 refills | Status: DC

## 2018-01-29 MED ORDER — RANITIDINE HCL 300 MG PO TABS: 300.0000 mg | ORAL_TABLET | Freq: Every day | ORAL | 5 refills | Status: DC

## 2018-01-29 MED ORDER — DOXEPIN HCL 10 MG PO CAPS: 10.0000 mg | ORAL_CAPSULE | Freq: Every day | ORAL | 5 refills | Status: DC

## 2018-01-29 MED ORDER — MONTELUKAST SODIUM 10 MG PO TABS: 10.0000 mg | ORAL_TABLET | Freq: Every day | ORAL | 5 refills | Status: DC

## 2018-01-29 MED ORDER — OLOPATADINE HCL 0.2 % OP SOLN: 1.0000 [drp] | Freq: Two times a day (BID) | OPHTHALMIC | 5 refills | Status: DC

## 2018-01-29 MED ORDER — LEVOCETIRIZINE DIHYDROCHLORIDE 5 MG PO TABS: 10.0000 mg | ORAL_TABLET | Freq: Every day | ORAL | 5 refills | Status: DC

## 2018-01-29 MED ORDER — TACROLIMUS 0.03 % EX OINT: TOPICAL_OINTMENT | Freq: Two times a day (BID) | CUTANEOUS | 5 refills | Status: DC

## 2018-01-29 MED ORDER — FEXOFENADINE HCL 180 MG PO TABS: 360.0000 mg | ORAL_TABLET | Freq: Every day | ORAL | 5 refills | Status: DC

## 2018-01-29 MED ORDER — CETIRIZINE HCL 10 MG PO TABS: 20.0000 mg | ORAL_TABLET | Freq: Every day | ORAL | 5 refills | Status: DC

## 2018-01-29 MED ORDER — FEXOFENADINE HCL 180 MG PO TABS: 180.0000 mg | ORAL_TABLET | Freq: Every day | ORAL | 5 refills | Status: DC

## 2018-01-29 NOTE — Telephone Encounter (Signed)
I sent in tacrolimus instead. I am assuming that is what you meant to type in your last note.   Salvatore Marvel, MD [facility] [place] [facility] [place]

## 2018-01-29 NOTE — Telephone Encounter (Signed)
Patient was seen yesterday in [facility]. Her prescriptions for allegra (she said someone was checking to see if insurance would cover this), xyzal, zyrtec, zantac, divalproex?, eyedrops 2 times a day, and an ointment that she can use on her face. She said Nepal made her face burn. [facility]

## 2018-01-29 NOTE — Telephone Encounter (Signed)
Medications have been sent in. I called and spoke with the patient and let her know. We will try and send in [place] and see if her insurance will cover it.

## 2018-01-29 NOTE — Telephone Encounter (Signed)
Pharmacy called stating the cream that was sent in for as a Rx isn't covered by her insurance and she stated Tatacrolimus is covered.  Can we change it this cream or is there something else we can prescribe her?

## 2018-01-30 ENCOUNTER — Telehealth: Payer: Self-pay | Admitting: Allergy & Immunology

## 2018-01-30 NOTE — Telephone Encounter (Signed)
[name], with [facility] is calling about questions regarding a prior authorization for Elidel 1% cream.

## 2018-01-30 NOTE — Telephone Encounter (Signed)
Jody, with [facility] is calling about questions regarding a prior authorization for Elidel 1% cream. Prior Josem Kaufmann has been approved by patient's insurance. Patient is aware.

## 2018-01-30 NOTE — Telephone Encounter (Signed)
Patient call ed back advising that the pharmacist informed her that the substitution sent in may not be as good for her eyelids or around her mouth. She would like for [place] to resubmit the PA stating that the Elidel is the only thing that she can use around her eyes and lips. I did advise her that we would await notification from the pharmacy and go from there.

## 2018-01-31 NOTE — Telephone Encounter (Signed)
Noted.  Erica Marvel, MD [facility] [place] [facility] [place]

## 2018-02-04 ENCOUNTER — Ambulatory Visit (INDEPENDENT_AMBULATORY_CARE_PROVIDER_SITE_OTHER): Payer: Medicare Other

## 2018-02-04 DIAGNOSIS — L501 Idiopathic urticaria: Secondary | ICD-10-CM | POA: Diagnosis not present

## 2018-02-04 MED ORDER — PIMECROLIMUS 1 % EX CREA: TOPICAL_CREAM | Freq: Two times a day (BID) | CUTANEOUS | 5 refills | Status: DC

## 2018-02-04 NOTE — Progress Notes (Signed)
Patient stated that her Elidel would need to be sent in and inform them that it has been approve by insurance. Rx was not in medication list so Rx has been sent in.

## 2018-02-11 ENCOUNTER — Encounter: Payer: Self-pay | Admitting: Advanced Practice Midwife

## 2018-02-11 ENCOUNTER — Other Ambulatory Visit: Payer: Self-pay

## 2018-02-11 ENCOUNTER — Ambulatory Visit (INDEPENDENT_AMBULATORY_CARE_PROVIDER_SITE_OTHER): Payer: Medicare Other | Admitting: Advanced Practice Midwife

## 2018-02-11 VITALS — BP 129/78 | HR 73 | Ht 64.0 in | Wt 163.0 lb

## 2018-02-11 DIAGNOSIS — Z3202 Encounter for pregnancy test, result negative: Secondary | ICD-10-CM | POA: Diagnosis not present

## 2018-02-11 DIAGNOSIS — Z30431 Encounter for routine checking of intrauterine contraceptive device: Secondary | ICD-10-CM

## 2018-02-11 LAB — POCT URINE PREGNANCY: Preg Test, Ur: NEGATIVE

## 2018-02-11 NOTE — Progress Notes (Signed)
History:  38 y.o. G0P0000 here today for today for IUD string check; Mirena IUD was placed  In December (this was her 3rd one, never bleeds). Has been spotting off and on for 3 weeks and (new, larger) boyfriend says he can feel IUD, not just the strings. Spotting is heaviest right after sex.  ON elequist BID.   The following portions of the patient's history were reviewed and updated as appropriate: allergies, current medications, past family history, past medical history, past social history, past surgical history and problem list.  Review of Systems:   Neg except for HPI    Objective:  Physical Exam Blood pressure 129/78, pulse 73, height 5\' 4"  (1.626 m), weight 163 lb (73.9 kg). Gen: NAD Abd: Soft, nontender and nondistended Pelvic: Bedside US reveals properly place IUD. SSE:  IUD strings visible, wrapped properly around cervix. IUD not palpable digitally.  Scant amount of blood tinged discharge. Wet prep neg except few WBCs.   Assessment & Plan:  Normal IUD check. Normal discharge Probably combo of larger boyfriend and blood thinners.  Pt OK

## 2018-02-12 DIAGNOSIS — F4310 Post-traumatic stress disorder, unspecified: Secondary | ICD-10-CM | POA: Diagnosis not present

## 2018-02-12 DIAGNOSIS — F3132 Bipolar disorder, current episode depressed, moderate: Secondary | ICD-10-CM | POA: Diagnosis not present

## 2018-02-13 ENCOUNTER — Other Ambulatory Visit: Payer: Self-pay | Admitting: Allergy & Immunology

## 2018-02-14 ENCOUNTER — Telehealth: Payer: Self-pay | Admitting: Family Medicine

## 2018-02-14 ENCOUNTER — Other Ambulatory Visit: Payer: Self-pay | Admitting: Family Medicine

## 2018-02-14 MED ORDER — VALACYCLOVIR HCL 1 G PO TABS: ORAL_TABLET | ORAL | 0 refills | Status: DC

## 2018-02-14 NOTE — Telephone Encounter (Signed)
Valtrex 1 g numb four two at first sign of fever blister two 12 hrs later

## 2018-02-14 NOTE — Telephone Encounter (Signed)
Medication sent to pharmacy; patient notified

## 2018-02-14 NOTE — Telephone Encounter (Signed)
Pt would like Dr. Richardson Landry to call in medication for a fever blister to [id number] - [place], [place] - [address] this afternoon. They are on vacation.

## 2018-02-14 NOTE — Telephone Encounter (Signed)
Please advise 

## 2018-02-18 ENCOUNTER — Ambulatory Visit: Payer: Medicare Other | Admitting: Women's Health

## 2018-02-19 DIAGNOSIS — F3132 Bipolar disorder, current episode depressed, moderate: Secondary | ICD-10-CM | POA: Diagnosis not present

## 2018-02-19 DIAGNOSIS — Z4659 Encounter for fitting and adjustment of other gastrointestinal appliance and device: Secondary | ICD-10-CM | POA: Diagnosis not present

## 2018-02-19 DIAGNOSIS — Z9884 Bariatric surgery status: Secondary | ICD-10-CM | POA: Diagnosis not present

## 2018-02-19 DIAGNOSIS — F4310 Post-traumatic stress disorder, unspecified: Secondary | ICD-10-CM | POA: Diagnosis not present

## 2018-02-19 DIAGNOSIS — Z23 Encounter for immunization: Secondary | ICD-10-CM | POA: Diagnosis not present

## 2018-02-19 DIAGNOSIS — Z79899 Other long term (current) drug therapy: Secondary | ICD-10-CM | POA: Diagnosis not present

## 2018-02-26 DIAGNOSIS — F4310 Post-traumatic stress disorder, unspecified: Secondary | ICD-10-CM | POA: Diagnosis not present

## 2018-02-26 DIAGNOSIS — F5105 Insomnia due to other mental disorder: Secondary | ICD-10-CM | POA: Diagnosis not present

## 2018-02-26 DIAGNOSIS — F3162 Bipolar disorder, current episode mixed, moderate: Secondary | ICD-10-CM | POA: Diagnosis not present

## 2018-02-26 DIAGNOSIS — Z452 Encounter for adjustment and management of vascular access device: Secondary | ICD-10-CM | POA: Diagnosis not present

## 2018-02-26 DIAGNOSIS — Z79899 Other long term (current) drug therapy: Secondary | ICD-10-CM | POA: Diagnosis not present

## 2018-02-26 DIAGNOSIS — F419 Anxiety disorder, unspecified: Secondary | ICD-10-CM | POA: Diagnosis not present

## 2018-03-04 ENCOUNTER — Ambulatory Visit (INDEPENDENT_AMBULATORY_CARE_PROVIDER_SITE_OTHER): Payer: Medicare Other | Admitting: *Deleted

## 2018-03-04 DIAGNOSIS — M9902 Segmental and somatic dysfunction of thoracic region: Secondary | ICD-10-CM | POA: Diagnosis not present

## 2018-03-04 DIAGNOSIS — M9903 Segmental and somatic dysfunction of lumbar region: Secondary | ICD-10-CM | POA: Diagnosis not present

## 2018-03-04 DIAGNOSIS — S335XXA Sprain of ligaments of lumbar spine, initial encounter: Secondary | ICD-10-CM | POA: Diagnosis not present

## 2018-03-04 DIAGNOSIS — L501 Idiopathic urticaria: Secondary | ICD-10-CM | POA: Diagnosis not present

## 2018-03-04 DIAGNOSIS — M9901 Segmental and somatic dysfunction of cervical region: Secondary | ICD-10-CM | POA: Diagnosis not present

## 2018-03-04 DIAGNOSIS — M546 Pain in thoracic spine: Secondary | ICD-10-CM | POA: Diagnosis not present

## 2018-03-04 DIAGNOSIS — S134XXA Sprain of ligaments of cervical spine, initial encounter: Secondary | ICD-10-CM | POA: Diagnosis not present

## 2018-03-12 DIAGNOSIS — F4310 Post-traumatic stress disorder, unspecified: Secondary | ICD-10-CM | POA: Diagnosis not present

## 2018-03-12 DIAGNOSIS — F319 Bipolar disorder, unspecified: Secondary | ICD-10-CM | POA: Diagnosis not present

## 2018-03-17 ENCOUNTER — Ambulatory Visit: Payer: Medicare Other | Admitting: Family Medicine

## 2018-03-19 DIAGNOSIS — Z9884 Bariatric surgery status: Secondary | ICD-10-CM | POA: Diagnosis not present

## 2018-03-19 DIAGNOSIS — Z48815 Encounter for surgical aftercare following surgery on the digestive system: Secondary | ICD-10-CM | POA: Diagnosis not present

## 2018-03-19 DIAGNOSIS — T148XXA Other injury of unspecified body region, initial encounter: Secondary | ICD-10-CM | POA: Diagnosis not present

## 2018-03-19 DIAGNOSIS — L7632 Postprocedural hematoma of skin and subcutaneous tissue following other procedure: Secondary | ICD-10-CM | POA: Diagnosis not present

## 2018-03-20 DIAGNOSIS — F4310 Post-traumatic stress disorder, unspecified: Secondary | ICD-10-CM | POA: Diagnosis not present

## 2018-03-20 DIAGNOSIS — M792 Neuralgia and neuritis, unspecified: Secondary | ICD-10-CM | POA: Diagnosis not present

## 2018-03-20 DIAGNOSIS — Z7189 Other specified counseling: Secondary | ICD-10-CM | POA: Diagnosis not present

## 2018-03-20 DIAGNOSIS — G894 Chronic pain syndrome: Secondary | ICD-10-CM | POA: Diagnosis not present

## 2018-03-20 DIAGNOSIS — F319 Bipolar disorder, unspecified: Secondary | ICD-10-CM | POA: Diagnosis not present

## 2018-03-20 DIAGNOSIS — Z8742 Personal history of other diseases of the female genital tract: Secondary | ICD-10-CM | POA: Diagnosis not present

## 2018-03-20 DIAGNOSIS — Z3189 Encounter for other procreative management: Secondary | ICD-10-CM | POA: Diagnosis not present

## 2018-03-20 DIAGNOSIS — Z30432 Encounter for removal of intrauterine contraceptive device: Secondary | ICD-10-CM | POA: Diagnosis not present

## 2018-03-20 DIAGNOSIS — R1013 Epigastric pain: Secondary | ICD-10-CM | POA: Diagnosis not present

## 2018-03-25 ENCOUNTER — Ambulatory Visit: Payer: Medicare Other | Admitting: Allergy & Immunology

## 2018-03-31 ENCOUNTER — Encounter: Payer: Self-pay | Admitting: Family Medicine

## 2018-03-31 ENCOUNTER — Ambulatory Visit (INDEPENDENT_AMBULATORY_CARE_PROVIDER_SITE_OTHER): Payer: Medicare Other | Admitting: Family Medicine

## 2018-03-31 VITALS — BP 116/76 | Temp 98.2°F | Ht 64.0 in | Wt 159.8 lb

## 2018-03-31 DIAGNOSIS — J019 Acute sinusitis, unspecified: Secondary | ICD-10-CM

## 2018-03-31 MED ORDER — AMOXICILLIN 500 MG PO CAPS: 500.0000 mg | ORAL_CAPSULE | Freq: Three times a day (TID) | ORAL | 0 refills | Status: DC

## 2018-03-31 NOTE — Progress Notes (Signed)
Subjective:    Patient ID: [name], female    DOB: 10/25/1980, 37 y.o.   MRN: [id number]  Sinus Problem  This is a new problem. The current episode started in the past 7 days. Associated symptoms include congestion, coughing, ear pain, headaches and a sore throat. Pertinent negatives include no shortness of breath. Treatments tried: elderberry juice, otc decongestants.   6 days ago started with congestion and productive cough of green phelgm. Bilateral ear pain and mid-frontal h/a started x 3 days ago. Has tried sudafed, elderberry juice, cough lozenges, and zinc, nasal saline sprays has not helped. Reports congestion and h/a worse when lying down. Symptoms have worsened over the last few days.   Review of Systems  Constitutional: Negative for fever.  HENT: Positive for congestion, ear pain and sore throat.   Respiratory: Positive for cough. Negative for shortness of breath and wheezing.   Gastrointestinal: Positive for nausea. Negative for diarrhea and vomiting.  Neurological: Positive for headaches.       Objective:   Physical Exam  Constitutional: She is oriented to person, place, and time. She appears well-developed and well-nourished. No distress.  HENT:  Head: Normocephalic and atraumatic.  Right Ear: Tympanic membrane normal.  Left Ear: Tympanic membrane normal.  Nose: Mucosal edema and sinus tenderness present.  Mouth/Throat: Uvula is midline and oropharynx is clear and moist.  Eyes: Right eye exhibits no discharge. Left eye exhibits no discharge.  Neck: Neck supple.  Cardiovascular: Normal rate, regular rhythm and normal heart sounds.  Pulmonary/Chest: Effort normal and breath sounds normal. No respiratory distress.  Lymphadenopathy:    She has no cervical adenopathy.  Neurological: She is alert and oriented to person, place, and time.  Skin: Skin is warm and dry.  Nursing note and vitals reviewed.      Assessment & Plan:  Acute rhinosinusitis Likely  post-viral sinusitis. Abx prescribed. Warning signs discussed, f/u if symptoms worsen or fail to improve. Recommend she contact her allergist for their recommendation on whether or not to still get her allergy injection later this week.  Dr. Lilyan Punt was consulted on this case and is in agreement with the above treatment plan.

## 2018-04-01 DIAGNOSIS — Z3009 Encounter for other general counseling and advice on contraception: Secondary | ICD-10-CM | POA: Diagnosis not present

## 2018-04-01 DIAGNOSIS — Z452 Encounter for adjustment and management of vascular access device: Secondary | ICD-10-CM | POA: Diagnosis not present

## 2018-04-01 DIAGNOSIS — D509 Iron deficiency anemia, unspecified: Secondary | ICD-10-CM | POA: Diagnosis not present

## 2018-04-01 DIAGNOSIS — Z86718 Personal history of other venous thrombosis and embolism: Secondary | ICD-10-CM | POA: Diagnosis not present

## 2018-04-01 DIAGNOSIS — Z7189 Other specified counseling: Secondary | ICD-10-CM | POA: Diagnosis not present

## 2018-04-01 DIAGNOSIS — E756 Lipid storage disorder, unspecified: Secondary | ICD-10-CM | POA: Diagnosis not present

## 2018-04-01 DIAGNOSIS — D500 Iron deficiency anemia secondary to blood loss (chronic): Secondary | ICD-10-CM | POA: Diagnosis not present

## 2018-04-02 ENCOUNTER — Ambulatory Visit: Payer: Medicare Other

## 2018-04-03 ENCOUNTER — Telehealth: Payer: Self-pay | Admitting: Family Medicine

## 2018-04-03 ENCOUNTER — Other Ambulatory Visit: Payer: Self-pay | Admitting: Family Medicine

## 2018-04-03 MED ORDER — HYDROCODONE-HOMATROPINE 5-1.5 MG/5ML PO SYRP: ORAL_SOLUTION | ORAL | 0 refills | Status: DC

## 2018-04-03 MED ORDER — DOXYCYCLINE HYCLATE 100 MG PO TABS: 100.0000 mg | ORAL_TABLET | Freq: Two times a day (BID) | ORAL | 0 refills | Status: DC

## 2018-04-03 NOTE — Telephone Encounter (Signed)
Stop the amoxicillin. Start taking doxycyline, 1 tablet bid, take with a snack and tall glass of water, x 10 days. Hycodan cough syrup given, make take 1 tsp at bedtime prn for cough. Prescriptions have been sent in to [facility]. F/u if symptoms worsen or fail to improve after several days.

## 2018-04-03 NOTE — Telephone Encounter (Signed)
Patient notified and verbalized understanding. 

## 2018-04-03 NOTE — Progress Notes (Signed)
See telephone message. PMP database checked. Instructed pt to ensure she does not take hycodan with her clonazepam.

## 2018-04-03 NOTE — Telephone Encounter (Signed)
Patient is requesting different antibiotic stating the amoxil 500 mg not  working and also wanting a cough medicine called into [facility]

## 2018-04-07 ENCOUNTER — Ambulatory Visit (INDEPENDENT_AMBULATORY_CARE_PROVIDER_SITE_OTHER): Payer: Medicare Other | Admitting: Family Medicine

## 2018-04-07 ENCOUNTER — Encounter: Payer: Self-pay | Admitting: Family Medicine

## 2018-04-07 VITALS — BP 110/72 | Ht 64.0 in | Wt 159.0 lb

## 2018-04-07 DIAGNOSIS — J019 Acute sinusitis, unspecified: Secondary | ICD-10-CM

## 2018-04-07 MED ORDER — SUMATRIPTAN SUCCINATE 100 MG PO TABS: ORAL_TABLET | ORAL | 1 refills | Status: DC

## 2018-04-07 MED ORDER — LEVOFLOXACIN 500 MG PO TABS: 500.0000 mg | ORAL_TABLET | Freq: Every day | ORAL | 0 refills | Status: DC

## 2018-04-07 MED ORDER — CEFTRIAXONE SODIUM 500 MG IJ SOLR
500.0000 mg | Freq: Once | INTRAMUSCULAR | Status: AC
  Administered 2018-04-07: 500 mg via INTRAMUSCULAR

## 2018-04-07 MED ORDER — ALBUTEROL SULFATE HFA 108 (90 BASE) MCG/ACT IN AERS: 2.0000 | INHALATION_SPRAY | Freq: Four times a day (QID) | RESPIRATORY_TRACT | 3 refills | Status: DC | PRN

## 2018-04-07 NOTE — Progress Notes (Signed)
Subjective:    Patient ID: [name], female    DOB: Jul 01, 1980, 37 y.o.   MRN: [id number]  Sinusitis  This is a new problem. Episode onset: 2 weeks. Associated symptoms include congestion, coughing, ear pain, headaches and a sore throat. Pertinent negatives include no shortness of breath. (Wheezing, runny nose) Treatments tried: amoxil, augmentin, sudafed, cough syrup, inhaler.    Patient with significant head congestion drainage coughing sinus not feeling good denies high fever chills  Review of Systems  Constitutional: Negative for activity change and fever.  HENT: Positive for congestion, ear pain, rhinorrhea and sore throat.   Eyes: Negative for discharge.  Respiratory: Positive for cough. Negative for shortness of breath and wheezing.   Cardiovascular: Negative for chest pain.  Neurological: Positive for headaches.       Objective:   Physical Exam  Constitutional: She appears well-developed.  HENT:  Head: Normocephalic.  Nose: Nose normal.  Mouth/Throat: Oropharynx is clear and moist. No oropharyngeal exudate.  Neck: Neck supple.  Cardiovascular: Normal rate and normal heart sounds.  No murmur heard. Pulmonary/Chest: Effort normal and breath sounds normal. She has no wheezes.  Lymphadenopathy:    She has no cervical adenopathy.  Skin: Skin is warm and dry.  Nursing note and vitals reviewed.         Assessment & Plan:  Acute rhinosinusitis We will go ahead with a shot of Rocephin 500 mg IM Add Levaquin 500 mg daily for 7 days If progressive troubles or worse follow-up Lab work from [facility] reviewed from late October No need for x-ray or lab work today

## 2018-04-08 ENCOUNTER — Ambulatory Visit: Payer: Medicare Other | Admitting: Family Medicine

## 2018-04-09 ENCOUNTER — Ambulatory Visit: Payer: Medicare Other

## 2018-04-13 ENCOUNTER — Emergency Department (HOSPITAL_COMMUNITY)
Admission: EM | Admit: 2018-04-13 | Discharge: 2018-04-14 | Disposition: A | Discharge status: Home / Self Care | Payer: Medicare Other | Attending: Emergency Medicine | Admitting: Emergency Medicine

## 2018-04-13 ENCOUNTER — Encounter (HOSPITAL_COMMUNITY): Payer: Self-pay | Admitting: Emergency Medicine

## 2018-04-13 ENCOUNTER — Other Ambulatory Visit: Payer: Self-pay

## 2018-04-13 DIAGNOSIS — Z91040 Latex allergy status: Secondary | ICD-10-CM | POA: Diagnosis not present

## 2018-04-13 DIAGNOSIS — R1013 Epigastric pain: Secondary | ICD-10-CM

## 2018-04-13 DIAGNOSIS — I10 Essential (primary) hypertension: Secondary | ICD-10-CM | POA: Diagnosis not present

## 2018-04-13 DIAGNOSIS — Z79899 Other long term (current) drug therapy: Secondary | ICD-10-CM | POA: Diagnosis not present

## 2018-04-13 DIAGNOSIS — R112 Nausea with vomiting, unspecified: Secondary | ICD-10-CM

## 2018-04-13 LAB — COMPREHENSIVE METABOLIC PANEL
ALBUMIN: 4.9 g/dL (ref 3.5–5.0)
ALT: 24 U/L (ref 0–44)
AST: 24 U/L (ref 15–41)
Alkaline Phosphatase: 86 U/L (ref 38–126)
Anion gap: 11 (ref 5–15)
BUN: 10 mg/dL (ref 6–20)
CO2: 23 mmol/L (ref 22–32)
CREATININE: 0.75 mg/dL (ref 0.44–1.00)
Calcium: 9.5 mg/dL (ref 8.9–10.3)
Chloride: 102 mmol/L (ref 98–111)
GFR calc Af Amer: >60 mL/min (ref >60)
GFR calc non Af Amer: >60 mL/min (ref >60)
GLUCOSE: 112 mg/dL — AB (ref 70–99)
Potassium: 4 mmol/L (ref 3.5–5.1)
SODIUM: 136 mmol/L (ref 135–145)
Total Bilirubin: 0.8 mg/dL (ref 0.3–1.2)
Total Protein: 8.2 g/dL — ABNORMAL HIGH (ref 6.5–8.1)

## 2018-04-13 LAB — LIPASE, BLOOD: Lipase: 24 U/L (ref 11–51)

## 2018-04-13 LAB — CBC
HCT: 45.3 % (ref 36.0–46.0)
Hemoglobin: 14.7 g/dL (ref 12.0–15.0)
MCH: 30.6 pg (ref 26.0–34.0)
MCHC: 32.5 g/dL (ref 30.0–36.0)
MCV: 94.2 fL (ref 80.0–100.0)
Platelets: 311 10*3/uL (ref 150–400)
RBC: 4.81 MIL/uL (ref 3.87–5.11)
RDW: 12.7 % (ref 11.5–15.5)
WBC: 10 10*3/uL (ref 4.0–10.5)
nRBC: 0 % (ref 0.0–0.2)

## 2018-04-13 LAB — I-STAT BETA HCG BLOOD, ED (MC, WL, AP ONLY): I-stat hCG, quantitative: <5 m[IU]/mL (ref <5)

## 2018-04-13 MED ORDER — PROMETHAZINE HCL 25 MG/ML IJ SOLN
12.5000 mg | Freq: Once | INTRAMUSCULAR | Status: AC
  Administered 2018-04-13: 12.5 mg via INTRAVENOUS
  Filled 2018-04-13: qty 1

## 2018-04-13 MED ORDER — SODIUM CHLORIDE 0.9 % IV SOLN: INTRAVENOUS | Status: DC

## 2018-04-13 MED ORDER — ONDANSETRON HCL 4 MG/2ML IJ SOLN
4.0000 mg | Freq: Once | INTRAMUSCULAR | Status: AC
  Administered 2018-04-13: 4 mg via INTRAVENOUS
  Filled 2018-04-13: qty 2

## 2018-04-13 MED ORDER — SODIUM CHLORIDE 0.9 % IV BOLUS
1000.0000 mL | Freq: Once | INTRAVENOUS | Status: AC
  Administered 2018-04-13: 1000 mL via INTRAVENOUS

## 2018-04-13 NOTE — ED Triage Notes (Signed)
Pt C/O mid abdominal pain that started on Thursday. Pt states that she has been unable to keep anything down since that time. Pt states she thinks that her "potassium is low."

## 2018-04-14 NOTE — Discharge Instructions (Addendum)
As we discussed the potassium here today was normal.  Follow-up with your GI's specialist at [facility] as needed.  Return for any new or worse symptoms.  Work note provided if needed.
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
## 2018-04-15 DIAGNOSIS — F319 Bipolar disorder, unspecified: Secondary | ICD-10-CM | POA: Diagnosis not present

## 2018-04-15 DIAGNOSIS — F4310 Post-traumatic stress disorder, unspecified: Secondary | ICD-10-CM | POA: Diagnosis not present

## 2018-04-16 ENCOUNTER — Ambulatory Visit: Payer: Medicare Other

## 2018-04-21 ENCOUNTER — Other Ambulatory Visit: Payer: Self-pay | Admitting: Family Medicine

## 2018-04-21 ENCOUNTER — Telehealth: Payer: Self-pay | Admitting: *Deleted

## 2018-04-21 NOTE — Telephone Encounter (Signed)
Ok numb 16 3 ref

## 2018-04-21 NOTE — Telephone Encounter (Signed)
Fax from [facility] requesting refill on phenadoz sup 12.5mg  insert one suppository rectally every 8 hours as needed for nausea. Med is not on med list but pharm has on form it was last written and filled on 11/21/17. Pt last seen here on 04/07/18 for sinus and went to ED on 11/10.

## 2018-04-22 ENCOUNTER — Other Ambulatory Visit: Payer: Self-pay | Admitting: Family Medicine

## 2018-04-22 ENCOUNTER — Ambulatory Visit: Payer: Medicare Other | Admitting: Allergy & Immunology

## 2018-04-22 DIAGNOSIS — F319 Bipolar disorder, unspecified: Secondary | ICD-10-CM | POA: Diagnosis not present

## 2018-04-22 DIAGNOSIS — Z452 Encounter for adjustment and management of vascular access device: Secondary | ICD-10-CM | POA: Diagnosis not present

## 2018-04-22 MED ORDER — PROMETHAZINE HCL 12.5 MG RE SUPP: RECTAL | 3 refills | Status: DC

## 2018-04-22 NOTE — Telephone Encounter (Signed)
Medication sent into pharmacy  

## 2018-04-23 ENCOUNTER — Ambulatory Visit: Payer: Medicare Other

## 2018-04-23 ENCOUNTER — Encounter: Payer: Self-pay | Admitting: Allergy & Immunology

## 2018-04-23 ENCOUNTER — Ambulatory Visit (INDEPENDENT_AMBULATORY_CARE_PROVIDER_SITE_OTHER): Payer: Medicare Other | Admitting: Allergy & Immunology

## 2018-04-23 VITALS — BP 104/64 | HR 84 | Resp 16 | Wt 142.4 lb

## 2018-04-23 DIAGNOSIS — L501 Idiopathic urticaria: Secondary | ICD-10-CM

## 2018-04-23 MED ORDER — TACROLIMUS 0.03 % EX OINT: TOPICAL_OINTMENT | Freq: Two times a day (BID) | CUTANEOUS | 5 refills | Status: DC

## 2018-04-23 MED ORDER — PIMECROLIMUS 1 % EX CREA: 1.0000 "application " | TOPICAL_CREAM | Freq: Two times a day (BID) | CUTANEOUS | 3 refills | Status: DC

## 2018-04-23 MED ORDER — FLUTICASONE PROPIONATE HFA 110 MCG/ACT IN AERO: 2.0000 | INHALATION_SPRAY | Freq: Two times a day (BID) | RESPIRATORY_TRACT | 5 refills | Status: DC

## 2018-04-23 MED ORDER — FAMOTIDINE 40 MG PO TABS: 40.0000 mg | ORAL_TABLET | Freq: Every evening | ORAL | 5 refills | Status: DC

## 2018-04-23 MED ORDER — LEVOCETIRIZINE DIHYDROCHLORIDE 5 MG PO TABS: 10.0000 mg | ORAL_TABLET | Freq: Every day | ORAL | 5 refills | Status: DC

## 2018-04-23 MED ORDER — TRIAMCINOLONE ACETONIDE 0.025 % EX OINT: 1.0000 "application " | TOPICAL_OINTMENT | Freq: Two times a day (BID) | CUTANEOUS | 3 refills | Status: DC | PRN

## 2018-04-23 MED ORDER — CETIRIZINE HCL 10 MG PO TABS: 20.0000 mg | ORAL_TABLET | Freq: Every day | ORAL | 5 refills | Status: DC

## 2018-04-23 MED ORDER — OLOPATADINE HCL 0.2 % OP SOLN: 1.0000 [drp] | Freq: Two times a day (BID) | OPHTHALMIC | 5 refills | Status: DC

## 2018-04-23 MED ORDER — MONTELUKAST SODIUM 10 MG PO TABS: 10.0000 mg | ORAL_TABLET | Freq: Every day | ORAL | 5 refills | Status: DC

## 2018-04-23 NOTE — Progress Notes (Signed)
FOLLOW UP  Date of Service/Encounter:  04/23/18   Assessment:   Chronic idiopathic urticaria- had been improved on Xolair  Seasonal and perennial allergic rhinitis(cat, dog, grass, roach, trees, ragweed)  Allergic contact dermatitis (sensitizations to gold, disperse blue)  Alpha gal syndrome  Complex medical history, including multiple GI surgeries, anemia,and bipolar depression  Plan/Recommendations:   1. Chronic idiopathic urticaria - on Xolair  - Stop the midday Allegra to see how you do with this.  - We will change to famotidine 40mg  at night.  - Continue with suppressive dosing of antihistamines:   - Morning: Xyzal (levocetirizine) 10mg  (two tablets)   - Midday: NOTHING  - Evening: Zyrtec (cetirizine) 20mg  (two tablets) + Doxepin 10mg  +famotidine 40mg  - If you have breakthrough rashes/hives, take cetirizine 40mg  (four tablets) to suppress the reaction. - Add on triamcinolone ointment twice daily as needed to the rash.   2. Perennial and seasonal allergic rhinitis (cats, dogs, grasses, trees, weeds) - We will hold off on allergy shots since you are doing so well with the Xolair.  - Continue with eye drops: Pataday one drop per eye twice daily.  3. Contact dermatitis - Continue Elidel twice daily as needed (safe to use on the face).  - Continue with moisturizing with your [facility] with sunscreen.   4. Return in about 4 months (around 08/22/2018).  Subjective:   [name] is a 37 y.o. female presenting today for follow up of  Chief Complaint  Patient presents with  . Urticaria  . Food Intolerance    [name] has a history of the following: Patient Active Problem List   Diagnosis Date Noted  . Tobacco abuse 12/20/2017  . Hyperlipidemia 01/30/2017  . 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 Seasonal and perennial allergic rhinitis 12/25/2016  . Chronic idiopathic urticaria 12/25/2016  . Interstitial cystitis 05/07/2016  .  Chronic fatigue 10/12/2015  . Chronic idiopathic constipation 05/20/2015  . 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 B12 deficiency 07/13/2013  . Drug withdrawal ([place]) 04/30/2013  . Symptoms concerning nutrition, metabolism, and development 04/24/2013  . Nausea with vomiting 04/15/2013  . 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 Polycystic ovaries 03/25/2013  . Fatty liver 02/19/2013  . Venous stasis 02/19/2013  . Peripheral edema 01/06/2013  . Chronic anticoagulation 12/30/2012  . Migraine headache 12/24/2012  . Chronic pain syndrome 09/09/2012  . 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 Esophageal reflux 08/19/2012  . Vitamin D deficiency 07/11/2012  . Insomnia secondary to depression with anxiety 07/11/2012  . Nausea alone 06/11/2012  . 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 ([place]) 04/12/2012  . 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 Hypokalemia 02/27/2012  . 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 Essential hypertension 01/18/2012  . Hypomagnesemia 01/18/2012  . OCD (obsessive compulsive disorder) 01/03/2012    Class: Chronic  . Depression 01/01/2012  . Anemia 10/01/2011  . Anxiety state 08/24/2011  . Obstructive sleep apnea 08/24/2011  . Abdominal pain 08/16/2011  . Endocrine disorder 05/21/2011  . Dyspepsia and disorder of function of stomach 03/21/2011  . Chest pain, unspecified 12/13/2010  . INTESTINAL MALABSORPTION, POSTSURGICAL 07/24/2010  . DIARRHEA 07/24/2010  . ABDOMINAL PAIN-PERIUMBILICAL 19/41/7408    History obtained from: chart review and patient.  [name] Primary Care Provider is Mikey Kirschner, MD.     [name] is a 37 y.o. female presenting for a follow up visit.  She has a complicated past medical history.  She was last  seen in August 2019.  At that time, we restarted her Xolair for her pruritus and urticaria.  We did add an extra antihistamines at the last visit to help control her itching.  She  was interested in doing Xolair every 2 weeks, but we told her that she needed to fail every month for 3 doses before her insurance approves this.  She was interested in allergen immunotherapy and we discussed this,  Since the last visit, she has mostly done well. She is concerned with food allergies at this point. She is concerned with pork allergy as well as egg drop soup. She has never made it herself at home to see if this causes the reaction. She also has some problems with bacon and other fried foods. This is bacon anywhere. These reactions started happening over the course of the last few months. The last reaction was with the egg drop soap. She vomited for 216 hours and living off of popsicles and gatorade. She has been having alternating nausea and vomiting for two weeks. [facility] sent her to integrative therapy in [place]. She is scheduled to see [facility] (Dr. Carolynne Edouard). This appointment is next Tuesday.  She did recently get a URI and was treated with three rounds of antibiotics. She continues to have a productive cough but is not interested in prednisone or more antibiotics at this point in time.   Otherwise, there have been no changes to her past medical history, surgical history, family history, or social history.    Review of Systems: a 14-point review of systems is pertinent for what is mentioned in HPI.  Otherwise, all other systems were negative.  Constitutional: negative other than that listed in the HPI Eyes: negative other than that listed in the HPI Ears, nose, mouth, throat, and face: negative other than that listed in the HPI Respiratory: negative other than that listed in the HPI Cardiovascular: negative other than that listed in the HPI Gastrointestinal: negative other than that listed in the HPI Genitourinary: negative other than that listed in the HPI Integument: negative other than that listed in the HPI Hematologic: negative  other than that listed in the HPI Musculoskeletal: negative other than that listed in the HPI Neurological: negative other than that listed in the HPI Allergy/Immunologic: negative other than that listed in the HPI    Objective:   Blood pressure 104/64, pulse 84, resp. rate 16, weight 142 lb 6.4 oz (64.6 kg), SpO2 98 %. Body mass index is 24.44 kg/m.   Physical Exam:  General: Alert, interactive, in no acute distress. Pleasant female. Looks fairly good today.  Eyes: No conjunctival injection bilaterally, no discharge on the right, no discharge on the left and no Horner-Trantas dots present. PERRL bilaterally. EOMI without pain. No photophobia.  Ears: Right TM pearly gray with normal light reflex, Left TM pearly gray with normal light reflex, Right TM intact without perforation and Left TM intact without perforation.  Nose/Throat: External nose within normal limits and septum midline. Turbinates edematous and pale with clear discharge. Posterior oropharynx mildly erythematous without cobblestoning in the posterior oropharynx. Tonsils 2+ without exudates.  Tongue without thrush. Lungs: Clear to auscultation without wheezing, rhonchi or rales. No increased work of breathing. CV: Normal S1/S2. No murmurs. Capillary refill <2 seconds.  Skin: Warm and dry, without lesions or rashes. Neuro:   Grossly intact. No focal deficits appreciated. Responsive to questions.  Diagnostic studies: none      Salvatore Marvel, MD  [facility]

## 2018-04-23 NOTE — Patient Instructions (Addendum)
1. Chronic idiopathic urticaria - on Xolair  - Stop the midday Allegra to see how you do with this.  - We will change to famotidine 40mg  at night.  - Continue with suppressive dosing of antihistamines:   - Morning: Xyzal (levocetirizine) 10mg  (two tablets)   - Midday: NOTHING  - Evening: Zyrtec (cetirizine) 20mg  (two tablets) + Doxepin 10mg  +famotidine 40mg  - If you have breakthrough rashes/hives, take cetirizine 40mg  (four tablets) to suppress the reaction. - Add on triamcinolone ointment twice daily as needed to the rash.   2. Perennial and seasonal allergic rhinitis (cats, dogs, grasses, trees, weeds) - We will hold off on allergy shots since you are doing so well with the Xolair.  - Continue with eye drops: Pataday one drop per eye twice daily.  3. Contact dermatitis - Continue Elidel twice daily as needed (safe to use on the face).  - Continue with moisturizing with your [facility] with sunscreen.   4. Return in about 4 months (around 08/22/2018).   Please inform us of any Emergency Department visits, hospitalizations, or changes in symptoms. Call us before going to the ED for breathing or allergy symptoms since we might be able to fit you in for a sick visit. Feel free to contact us anytime with any questions, problems, or concerns.  It was a pleasure to see you again today!  Websites that have reliable patient information: 1. American Academy of Asthma, Allergy, and Immunology: www.aaaai.org 2. Food Allergy Research and Education (FARE): foodallergy.org 3. Mothers of Asthmatics: http://www.asthmacommunitynetwork.org 4. American College of Allergy, Asthma, and Immunology: MonthlyElectricBill.co.uk   Make sure you are registered to vote! If you have moved or changed any of your contact information, you will need to get this updated before voting!

## 2018-04-27 ENCOUNTER — Encounter: Payer: Self-pay | Admitting: Allergy & Immunology

## 2018-04-28 ENCOUNTER — Other Ambulatory Visit: Payer: Self-pay | Admitting: Family Medicine

## 2018-04-29 DIAGNOSIS — K5901 Slow transit constipation: Secondary | ICD-10-CM | POA: Diagnosis not present

## 2018-04-29 DIAGNOSIS — D509 Iron deficiency anemia, unspecified: Secondary | ICD-10-CM | POA: Diagnosis not present

## 2018-04-29 DIAGNOSIS — R634 Abnormal weight loss: Secondary | ICD-10-CM | POA: Diagnosis not present

## 2018-04-29 DIAGNOSIS — K911 Postgastric surgery syndromes: Secondary | ICD-10-CM | POA: Diagnosis not present

## 2018-04-29 DIAGNOSIS — K909 Intestinal malabsorption, unspecified: Secondary | ICD-10-CM | POA: Diagnosis not present

## 2018-04-29 DIAGNOSIS — R112 Nausea with vomiting, unspecified: Secondary | ICD-10-CM | POA: Diagnosis not present

## 2018-04-29 DIAGNOSIS — I2510 Atherosclerotic heart disease of native coronary artery without angina pectoris: Secondary | ICD-10-CM | POA: Diagnosis not present

## 2018-04-29 DIAGNOSIS — F319 Bipolar disorder, unspecified: Secondary | ICD-10-CM | POA: Diagnosis not present

## 2018-04-29 DIAGNOSIS — E46 Unspecified protein-calorie malnutrition: Secondary | ICD-10-CM | POA: Diagnosis not present

## 2018-05-01 DIAGNOSIS — K911 Postgastric surgery syndromes: Secondary | ICD-10-CM | POA: Diagnosis not present

## 2018-05-01 DIAGNOSIS — R634 Abnormal weight loss: Secondary | ICD-10-CM | POA: Diagnosis not present

## 2018-05-01 DIAGNOSIS — R109 Unspecified abdominal pain: Secondary | ICD-10-CM | POA: Diagnosis not present

## 2018-05-01 DIAGNOSIS — R112 Nausea with vomiting, unspecified: Secondary | ICD-10-CM | POA: Diagnosis not present

## 2018-05-07 DIAGNOSIS — R638 Other symptoms and signs concerning food and fluid intake: Secondary | ICD-10-CM | POA: Diagnosis not present

## 2018-05-07 DIAGNOSIS — R112 Nausea with vomiting, unspecified: Secondary | ICD-10-CM | POA: Diagnosis not present

## 2018-05-07 DIAGNOSIS — R1033 Periumbilical pain: Secondary | ICD-10-CM | POA: Diagnosis not present

## 2018-05-07 DIAGNOSIS — R69 Illness, unspecified: Secondary | ICD-10-CM | POA: Diagnosis not present

## 2018-05-07 DIAGNOSIS — K5901 Slow transit constipation: Secondary | ICD-10-CM | POA: Diagnosis not present

## 2018-05-08 DIAGNOSIS — D649 Anemia, unspecified: Secondary | ICD-10-CM | POA: Diagnosis not present

## 2018-05-08 DIAGNOSIS — R109 Unspecified abdominal pain: Secondary | ICD-10-CM | POA: Diagnosis not present

## 2018-05-08 DIAGNOSIS — G4700 Insomnia, unspecified: Secondary | ICD-10-CM | POA: Diagnosis not present

## 2018-05-08 DIAGNOSIS — E876 Hypokalemia: Secondary | ICD-10-CM | POA: Diagnosis not present

## 2018-05-08 DIAGNOSIS — D6859 Other primary thrombophilia: Secondary | ICD-10-CM | POA: Diagnosis not present

## 2018-05-08 DIAGNOSIS — R002 Palpitations: Secondary | ICD-10-CM | POA: Diagnosis not present

## 2018-05-08 DIAGNOSIS — M549 Dorsalgia, unspecified: Secondary | ICD-10-CM | POA: Diagnosis not present

## 2018-05-08 DIAGNOSIS — I9589 Other hypotension: Secondary | ICD-10-CM | POA: Diagnosis not present

## 2018-05-08 DIAGNOSIS — E861 Hypovolemia: Secondary | ICD-10-CM | POA: Diagnosis not present

## 2018-05-08 DIAGNOSIS — E43 Unspecified severe protein-calorie malnutrition: Secondary | ICD-10-CM | POA: Diagnosis not present

## 2018-05-08 DIAGNOSIS — R69 Illness, unspecified: Secondary | ICD-10-CM | POA: Diagnosis not present

## 2018-05-08 DIAGNOSIS — R112 Nausea with vomiting, unspecified: Secondary | ICD-10-CM | POA: Diagnosis not present

## 2018-05-09 DIAGNOSIS — R109 Unspecified abdominal pain: Secondary | ICD-10-CM | POA: Diagnosis not present

## 2018-05-09 DIAGNOSIS — R112 Nausea with vomiting, unspecified: Secondary | ICD-10-CM | POA: Diagnosis not present

## 2018-05-10 DIAGNOSIS — R109 Unspecified abdominal pain: Secondary | ICD-10-CM | POA: Diagnosis not present

## 2018-05-10 DIAGNOSIS — R112 Nausea with vomiting, unspecified: Secondary | ICD-10-CM | POA: Diagnosis not present

## 2018-05-11 DIAGNOSIS — R112 Nausea with vomiting, unspecified: Secondary | ICD-10-CM | POA: Diagnosis not present

## 2018-05-11 DIAGNOSIS — R109 Unspecified abdominal pain: Secondary | ICD-10-CM | POA: Diagnosis not present

## 2018-05-12 DIAGNOSIS — R109 Unspecified abdominal pain: Secondary | ICD-10-CM | POA: Diagnosis not present

## 2018-05-12 DIAGNOSIS — R112 Nausea with vomiting, unspecified: Secondary | ICD-10-CM | POA: Diagnosis not present

## 2018-05-12 MED ORDER — SCOPOLAMINE 1 MG/3DAYS TD PT72
1.00 | MEDICATED_PATCH | TRANSDERMAL | Status: DC
Start: 2018-05-14 — End: 2018-05-12

## 2018-05-12 MED ORDER — QUETIAPINE FUMARATE 200 MG PO TABS
200.00 | ORAL_TABLET | ORAL | Status: DC
Start: 2018-05-12 — End: 2018-05-12

## 2018-05-12 MED ORDER — ENOXAPARIN SODIUM 40 MG/0.4ML ~~LOC~~ SOLN
40.00 | SUBCUTANEOUS | Status: DC
Start: 2018-05-13 — End: 2018-05-12

## 2018-05-12 MED ORDER — SERTRALINE HCL 100 MG PO TABS
100.00 | ORAL_TABLET | ORAL | Status: DC
Start: 2018-05-13 — End: 2018-05-12

## 2018-05-12 MED ORDER — DOXEPIN HCL 10 MG PO CAPS
10.00 | ORAL_CAPSULE | ORAL | Status: DC
Start: 2018-05-12 — End: 2018-05-12

## 2018-05-12 MED ORDER — CLONAZEPAM 0.5 MG PO TABS
0.50 | ORAL_TABLET | ORAL | Status: DC
Start: 2018-05-12 — End: 2018-05-12

## 2018-05-12 MED ORDER — SUMATRIPTAN SUCCINATE 50 MG PO TABS
100.00 | ORAL_TABLET | ORAL | Status: DC
Start: ? — End: 2018-05-12

## 2018-05-12 MED ORDER — CHLORPROMAZINE HCL 25 MG PO TABS
25.00 | ORAL_TABLET | ORAL | Status: DC
Start: ? — End: 2018-05-12

## 2018-05-12 MED ORDER — LORATADINE 10 MG PO TABS
10.00 | ORAL_TABLET | ORAL | Status: DC
Start: 2018-05-13 — End: 2018-05-12

## 2018-05-12 MED ORDER — MONTELUKAST SODIUM 10 MG PO TABS
10.00 | ORAL_TABLET | ORAL | Status: DC
Start: 2018-05-13 — End: 2018-05-12

## 2018-05-12 MED ORDER — GENERIC EXTERNAL MEDICATION
3.00 | Status: DC
Start: ? — End: 2018-05-12

## 2018-05-12 MED ORDER — KETOTIFEN FUMARATE 0.025 % OP SOLN
1.00 | OPHTHALMIC | Status: DC
Start: ? — End: 2018-05-12

## 2018-05-12 MED ORDER — KCL-LACTATED RINGERS-D5W 20 MEQ/L IV SOLN
INTRAVENOUS | Status: DC
Start: ? — End: 2018-05-12

## 2018-05-13 DIAGNOSIS — R69 Illness, unspecified: Secondary | ICD-10-CM | POA: Diagnosis not present

## 2018-05-14 ENCOUNTER — Telehealth: Payer: Self-pay | Admitting: *Deleted

## 2018-05-14 NOTE — Telephone Encounter (Signed)
Forwarded note to Caryl Pina, NP through epic

## 2018-05-14 NOTE — Telephone Encounter (Signed)
Patient called states she went to [facility] and was diagnosed with AlphaGal and was wondering should she keep getting Xolair due to it containing animal product. Dr Ernst Bowler please advise. Patient is due for shipment this week but will not authorize shipment until she hears from [place]

## 2018-05-14 NOTE — Telephone Encounter (Signed)
I called [name] to talk to her more about her diagnosis. Evidently, she had IgE testing performed in her blood that demonstrated positives to alpha-gal, egg, and milk. She has been avoiding all of these foods since the weekend and her symptoms of stomach distress and weight loss have actually improved.  In any case, she had testing performed by an NP Chriss Czar who works in the [facility]. This included amongst other things an alpha gal panel and milk and egg; all of these were positive. [name] was told to avoid all of these things. I did confirm with [name] that the testing performed was IgE testing.   I did explain to [name] that being on Xolair while allergy testing was performed alters the results. Xolair binds to IgE and neutralizes it, making skin testing falsely negative AND making serum IgE falsely elevated (since it is bringing together multiple IgE molecules. This is likely the etiology of her elevated IgE levels to the foods that she mentions.   In any case, [name] was told that Xolair contains animal by products. I did a search and this is not the case. It definitely does not contain gelatin which some patients with alpha gal need to avoid. She can continue to get her Xolair since this has been such a life saver for her.   It should be noted that she did have an alpha gal panel sent before I even saw her (in April 2018) that showed an IgE of only 0.40 to alpha gal but was negative to the beef, pork, and lamb. I did not think that this was relevant at the time given the very low IgE only to alpha gal only.   Salvatore Marvel, MD [facility] [place] [facility] [place]

## 2018-05-20 DIAGNOSIS — L509 Urticaria, unspecified: Secondary | ICD-10-CM | POA: Diagnosis not present

## 2018-05-20 DIAGNOSIS — R21 Rash and other nonspecific skin eruption: Secondary | ICD-10-CM | POA: Diagnosis not present

## 2018-05-20 DIAGNOSIS — Z91018 Allergy to other foods: Secondary | ICD-10-CM | POA: Diagnosis not present

## 2018-05-20 DIAGNOSIS — W57XXXD Bitten or stung by nonvenomous insect and other nonvenomous arthropods, subsequent encounter: Secondary | ICD-10-CM | POA: Diagnosis not present

## 2018-05-20 DIAGNOSIS — E46 Unspecified protein-calorie malnutrition: Secondary | ICD-10-CM | POA: Diagnosis not present

## 2018-05-20 DIAGNOSIS — K911 Postgastric surgery syndromes: Secondary | ICD-10-CM | POA: Diagnosis not present

## 2018-05-20 DIAGNOSIS — R69 Illness, unspecified: Secondary | ICD-10-CM | POA: Diagnosis not present

## 2018-05-20 DIAGNOSIS — K909 Intestinal malabsorption, unspecified: Secondary | ICD-10-CM | POA: Diagnosis not present

## 2018-05-21 ENCOUNTER — Ambulatory Visit (INDEPENDENT_AMBULATORY_CARE_PROVIDER_SITE_OTHER): Payer: Medicare HMO | Admitting: *Deleted

## 2018-05-21 DIAGNOSIS — L501 Idiopathic urticaria: Secondary | ICD-10-CM

## 2018-05-23 DIAGNOSIS — D509 Iron deficiency anemia, unspecified: Secondary | ICD-10-CM | POA: Diagnosis not present

## 2018-05-23 DIAGNOSIS — Z91018 Allergy to other foods: Secondary | ICD-10-CM | POA: Diagnosis not present

## 2018-05-23 DIAGNOSIS — K909 Intestinal malabsorption, unspecified: Secondary | ICD-10-CM | POA: Diagnosis not present

## 2018-05-23 DIAGNOSIS — E46 Unspecified protein-calorie malnutrition: Secondary | ICD-10-CM | POA: Diagnosis not present

## 2018-05-23 DIAGNOSIS — R634 Abnormal weight loss: Secondary | ICD-10-CM | POA: Diagnosis not present

## 2018-05-23 DIAGNOSIS — Z713 Dietary counseling and surveillance: Secondary | ICD-10-CM | POA: Diagnosis not present

## 2018-05-26 ENCOUNTER — Telehealth: Payer: Self-pay | Admitting: Allergy

## 2018-05-26 NOTE — Telephone Encounter (Signed)
[name]. I talked wit patient and informed her the  Ranitidine has been recalled. Informed her to stop taking and to take back to pharmacy.She is taking taking the famotidine also. Told her it was ok to continued to take the famotidine. Told her I would call her back. Please advise. Thank you.

## 2018-05-26 NOTE — Telephone Encounter (Signed)
Informed patient

## 2018-05-26 NOTE — Telephone Encounter (Signed)
That is fine! Thank you for taking care of that.  Salvatore Marvel, MD [facility] [place] [facility] [place]

## 2018-05-29 DIAGNOSIS — N3001 Acute cystitis with hematuria: Secondary | ICD-10-CM | POA: Diagnosis not present

## 2018-05-29 DIAGNOSIS — N390 Urinary tract infection, site not specified: Secondary | ICD-10-CM | POA: Diagnosis not present

## 2018-05-29 DIAGNOSIS — Z3202 Encounter for pregnancy test, result negative: Secondary | ICD-10-CM | POA: Diagnosis not present

## 2018-05-29 DIAGNOSIS — R300 Dysuria: Secondary | ICD-10-CM | POA: Diagnosis not present

## 2018-06-05 ENCOUNTER — Telehealth: Payer: Self-pay | Admitting: Family Medicine

## 2018-06-05 ENCOUNTER — Telehealth: Payer: Self-pay

## 2018-06-05 ENCOUNTER — Telehealth: Payer: Self-pay | Admitting: *Deleted

## 2018-06-05 MED ORDER — CETIRIZINE HCL 10 MG PO TABS: 20.0000 mg | ORAL_TABLET | Freq: Every day | ORAL | 0 refills | Status: DC

## 2018-06-05 MED ORDER — FAMOTIDINE 40 MG PO TABS: 40.0000 mg | ORAL_TABLET | Freq: Every evening | ORAL | 0 refills | Status: DC

## 2018-06-05 MED ORDER — FLUTICASONE PROPIONATE HFA 110 MCG/ACT IN AERO: 2.0000 | INHALATION_SPRAY | Freq: Two times a day (BID) | RESPIRATORY_TRACT | 0 refills | Status: DC

## 2018-06-05 MED ORDER — OLOPATADINE HCL 0.2 % OP SOLN: 1.0000 [drp] | Freq: Two times a day (BID) | OPHTHALMIC | 0 refills | Status: DC

## 2018-06-05 MED ORDER — MONTELUKAST SODIUM 10 MG PO TABS: 10.0000 mg | ORAL_TABLET | Freq: Every day | ORAL | 0 refills | Status: DC

## 2018-06-05 NOTE — Telephone Encounter (Signed)
Sent all medications in to [facility] patient informed

## 2018-06-05 NOTE — Telephone Encounter (Signed)
Patient called stating with her insurance now she has to get her prescriptions through mail order, patient states the nurse will have to call them [facility] [employer] [phone] and fax is [phone].  Please advise

## 2018-06-05 NOTE — Telephone Encounter (Signed)
Pt has new insurance. This new insurance is making [facility]. Please send all future medications and refill on current medications to go there and 90 day supplies except antibiotics.   Pharmacy phone number [phone] Fax Number [phone]  Pharmacy would like call before medications are sent to provide instructions the only

## 2018-06-05 NOTE — Telephone Encounter (Signed)
Patient called stating that she doesn't know if her Xolair is working or not. Patient stated that she has been having hives for the last two weeks and it has gotten worse over the weekend. Patient stated that her mom is a nurse and told her to ask for something that helps with itching and it starts with an A. I believe she is referring to Atarax. Patient is coming in to see you on 06/20/2018 and will talk with you about her Xolair. She cancelled her injection appt on 06/08/2018 until she talks with you. She doesn't want to take injections if they are not going to help. Patient would like the medication her mom was talking about sent it. Please advise.

## 2018-06-05 NOTE — Telephone Encounter (Signed)
Left message to return call to clarify which meds she needs refills on

## 2018-06-06 MED ORDER — HYDROXYZINE HCL 25 MG PO TABS: 25.0000 mg | ORAL_TABLET | Freq: Three times a day (TID) | ORAL | 2 refills | Status: DC | PRN

## 2018-06-06 NOTE — Addendum Note (Signed)
Addended by: Valentina Shaggy on: 06/06/2018 07:00 AM   Modules accepted: Orders

## 2018-06-06 NOTE — Telephone Encounter (Signed)
I adjusted in pt chart to only use the [employer].

## 2018-06-06 NOTE — Telephone Encounter (Signed)
LM for patient to return call.

## 2018-06-06 NOTE — Telephone Encounter (Signed)
I sent in the Atarax prescription (25mg  Q8 hrs PRN). Please let the patient know. Also please warn her that it can cause sleepiness.   Thanks, Salvatore Marvel, MD [facility] [place] [facility] [place]

## 2018-06-06 NOTE — Telephone Encounter (Signed)
Per [facility] the pt called and states she does not need any refills she just wanted to tell us that all medications willl from here on out need to be sent to [facility].

## 2018-06-09 DIAGNOSIS — Z0100 Encounter for examination of eyes and vision without abnormal findings: Secondary | ICD-10-CM | POA: Diagnosis not present

## 2018-06-09 DIAGNOSIS — H52223 Regular astigmatism, bilateral: Secondary | ICD-10-CM | POA: Diagnosis not present

## 2018-06-10 MED ORDER — HYDROXYZINE HCL 25 MG PO TABS: 25.0000 mg | ORAL_TABLET | Freq: Three times a day (TID) | ORAL | 2 refills | Status: DC | PRN

## 2018-06-10 NOTE — Addendum Note (Signed)
Addended by: Lytle Michaels A on: 06/10/2018 08:47 AM   Modules accepted: Orders

## 2018-06-10 NOTE — Telephone Encounter (Signed)
Spoke with patient.  She [facility] [place]

## 2018-06-11 DIAGNOSIS — R110 Nausea: Secondary | ICD-10-CM | POA: Diagnosis not present

## 2018-06-11 DIAGNOSIS — N390 Urinary tract infection, site not specified: Secondary | ICD-10-CM | POA: Diagnosis not present

## 2018-06-11 DIAGNOSIS — R634 Abnormal weight loss: Secondary | ICD-10-CM | POA: Diagnosis not present

## 2018-06-11 DIAGNOSIS — K5901 Slow transit constipation: Secondary | ICD-10-CM | POA: Diagnosis not present

## 2018-06-11 DIAGNOSIS — N898 Other specified noninflammatory disorders of vagina: Secondary | ICD-10-CM | POA: Diagnosis not present

## 2018-06-11 DIAGNOSIS — L509 Urticaria, unspecified: Secondary | ICD-10-CM | POA: Diagnosis not present

## 2018-06-11 DIAGNOSIS — R69 Illness, unspecified: Secondary | ICD-10-CM | POA: Diagnosis not present

## 2018-06-11 DIAGNOSIS — R3915 Urgency of urination: Secondary | ICD-10-CM | POA: Diagnosis not present

## 2018-06-11 DIAGNOSIS — D509 Iron deficiency anemia, unspecified: Secondary | ICD-10-CM | POA: Diagnosis not present

## 2018-06-11 DIAGNOSIS — K909 Intestinal malabsorption, unspecified: Secondary | ICD-10-CM | POA: Diagnosis not present

## 2018-06-11 DIAGNOSIS — R350 Frequency of micturition: Secondary | ICD-10-CM | POA: Diagnosis not present

## 2018-06-11 DIAGNOSIS — R82998 Other abnormal findings in urine: Secondary | ICD-10-CM | POA: Diagnosis not present

## 2018-06-11 DIAGNOSIS — D649 Anemia, unspecified: Secondary | ICD-10-CM | POA: Diagnosis not present

## 2018-06-11 DIAGNOSIS — G4709 Other insomnia: Secondary | ICD-10-CM | POA: Diagnosis not present

## 2018-06-11 DIAGNOSIS — K911 Postgastric surgery syndromes: Secondary | ICD-10-CM | POA: Diagnosis not present

## 2018-06-11 DIAGNOSIS — R112 Nausea with vomiting, unspecified: Secondary | ICD-10-CM | POA: Diagnosis not present

## 2018-06-11 DIAGNOSIS — Z9884 Bariatric surgery status: Secondary | ICD-10-CM | POA: Diagnosis not present

## 2018-06-11 DIAGNOSIS — E43 Unspecified severe protein-calorie malnutrition: Secondary | ICD-10-CM | POA: Diagnosis not present

## 2018-06-11 DIAGNOSIS — F419 Anxiety disorder, unspecified: Secondary | ICD-10-CM | POA: Diagnosis not present

## 2018-06-11 DIAGNOSIS — R21 Rash and other nonspecific skin eruption: Secondary | ICD-10-CM | POA: Diagnosis not present

## 2018-06-11 MED ORDER — HYDROXYZINE HCL 25 MG PO TABS: 25.0000 mg | ORAL_TABLET | Freq: Three times a day (TID) | ORAL | 2 refills | Status: DC | PRN

## 2018-06-11 NOTE — Addendum Note (Signed)
Addended by: Valentina Shaggy on: 06/11/2018 03:51 PM   Modules accepted: Orders

## 2018-06-11 NOTE — Telephone Encounter (Signed)
Patient called to request that the Atarax get sent to a different pharmacy that is NOT in [employer]. Prescription resent.  Salvatore Marvel, MD [facility] [place] [facility] [place]

## 2018-06-13 ENCOUNTER — Other Ambulatory Visit: Payer: Self-pay

## 2018-06-13 MED ORDER — HYDROXYZINE HCL 25 MG PO TABS: 25.0000 mg | ORAL_TABLET | Freq: Three times a day (TID) | ORAL | 0 refills | Status: DC | PRN

## 2018-06-13 NOTE — Progress Notes (Signed)
[facility] called and stated that [facility] not [facility] a [facility] for her Atarax. Rx resent. [facility] will give [place] a call within the hour if they do not get the Rx. If Rx does not go through we will print it, get it signed and [facility] to [facility].

## 2018-06-16 DIAGNOSIS — Z124 Encounter for screening for malignant neoplasm of cervix: Secondary | ICD-10-CM | POA: Diagnosis not present

## 2018-06-16 DIAGNOSIS — N979 Female infertility, unspecified: Secondary | ICD-10-CM | POA: Diagnosis not present

## 2018-06-16 DIAGNOSIS — R875 Abnormal microbiological findings in specimens from female genital organs: Secondary | ICD-10-CM | POA: Diagnosis not present

## 2018-06-16 DIAGNOSIS — Z01419 Encounter for gynecological examination (general) (routine) without abnormal findings: Secondary | ICD-10-CM | POA: Diagnosis not present

## 2018-06-16 DIAGNOSIS — R69 Illness, unspecified: Secondary | ICD-10-CM | POA: Diagnosis not present

## 2018-06-16 DIAGNOSIS — R87612 Low grade squamous intraepithelial lesion on cytologic smear of cervix (LGSIL): Secondary | ICD-10-CM | POA: Diagnosis not present

## 2018-06-17 DIAGNOSIS — R69 Illness, unspecified: Secondary | ICD-10-CM | POA: Diagnosis not present

## 2018-06-18 ENCOUNTER — Ambulatory Visit: Payer: Medicare HMO

## 2018-06-18 DIAGNOSIS — W57XXXD Bitten or stung by nonvenomous insect and other nonvenomous arthropods, subsequent encounter: Secondary | ICD-10-CM | POA: Diagnosis not present

## 2018-06-18 DIAGNOSIS — R634 Abnormal weight loss: Secondary | ICD-10-CM | POA: Diagnosis not present

## 2018-06-18 DIAGNOSIS — J029 Acute pharyngitis, unspecified: Secondary | ICD-10-CM | POA: Diagnosis not present

## 2018-06-18 DIAGNOSIS — N390 Urinary tract infection, site not specified: Secondary | ICD-10-CM | POA: Diagnosis not present

## 2018-06-18 DIAGNOSIS — K5901 Slow transit constipation: Secondary | ICD-10-CM | POA: Diagnosis not present

## 2018-06-18 DIAGNOSIS — Z91018 Allergy to other foods: Secondary | ICD-10-CM | POA: Diagnosis not present

## 2018-06-20 ENCOUNTER — Ambulatory Visit: Payer: Medicare HMO | Admitting: Allergy & Immunology

## 2018-06-26 DIAGNOSIS — R69 Illness, unspecified: Secondary | ICD-10-CM | POA: Diagnosis not present

## 2018-06-30 DIAGNOSIS — R69 Illness, unspecified: Secondary | ICD-10-CM | POA: Diagnosis not present

## 2018-06-30 DIAGNOSIS — J0141 Acute recurrent pansinusitis: Secondary | ICD-10-CM | POA: Diagnosis not present

## 2018-06-30 DIAGNOSIS — J40 Bronchitis, not specified as acute or chronic: Secondary | ICD-10-CM | POA: Diagnosis not present

## 2018-07-08 DIAGNOSIS — R69 Illness, unspecified: Secondary | ICD-10-CM | POA: Diagnosis not present

## 2018-07-08 DIAGNOSIS — R87612 Low grade squamous intraepithelial lesion on cytologic smear of cervix (LGSIL): Secondary | ICD-10-CM | POA: Diagnosis not present

## 2018-07-08 DIAGNOSIS — N72 Inflammatory disease of cervix uteri: Secondary | ICD-10-CM | POA: Diagnosis not present

## 2018-07-08 DIAGNOSIS — N870 Mild cervical dysplasia: Secondary | ICD-10-CM | POA: Diagnosis not present

## 2018-07-14 DIAGNOSIS — J029 Acute pharyngitis, unspecified: Secondary | ICD-10-CM | POA: Diagnosis not present

## 2018-07-14 DIAGNOSIS — K1121 Acute sialoadenitis: Secondary | ICD-10-CM | POA: Diagnosis not present

## 2018-07-16 DIAGNOSIS — J301 Allergic rhinitis due to pollen: Secondary | ICD-10-CM | POA: Diagnosis not present

## 2018-07-16 DIAGNOSIS — Z91018 Allergy to other foods: Secondary | ICD-10-CM | POA: Diagnosis not present

## 2018-07-16 DIAGNOSIS — L509 Urticaria, unspecified: Secondary | ICD-10-CM | POA: Diagnosis not present

## 2018-07-23 DIAGNOSIS — R350 Frequency of micturition: Secondary | ICD-10-CM | POA: Diagnosis not present

## 2018-07-23 DIAGNOSIS — J45909 Unspecified asthma, uncomplicated: Secondary | ICD-10-CM | POA: Diagnosis not present

## 2018-07-23 DIAGNOSIS — Z91018 Allergy to other foods: Secondary | ICD-10-CM | POA: Diagnosis not present

## 2018-07-23 DIAGNOSIS — N390 Urinary tract infection, site not specified: Secondary | ICD-10-CM | POA: Diagnosis not present

## 2018-07-23 DIAGNOSIS — Z79899 Other long term (current) drug therapy: Secondary | ICD-10-CM | POA: Diagnosis not present

## 2018-07-23 DIAGNOSIS — Z9884 Bariatric surgery status: Secondary | ICD-10-CM | POA: Diagnosis not present

## 2018-07-23 DIAGNOSIS — Z885 Allergy status to narcotic agent status: Secondary | ICD-10-CM | POA: Diagnosis not present

## 2018-07-23 DIAGNOSIS — Z87891 Personal history of nicotine dependence: Secondary | ICD-10-CM | POA: Diagnosis not present

## 2018-07-23 DIAGNOSIS — Z7901 Long term (current) use of anticoagulants: Secondary | ICD-10-CM | POA: Diagnosis not present

## 2018-07-23 DIAGNOSIS — K219 Gastro-esophageal reflux disease without esophagitis: Secondary | ICD-10-CM | POA: Diagnosis not present

## 2018-07-23 DIAGNOSIS — Z91040 Latex allergy status: Secondary | ICD-10-CM | POA: Diagnosis not present

## 2018-07-25 DIAGNOSIS — Z8742 Personal history of other diseases of the female genital tract: Secondary | ICD-10-CM | POA: Diagnosis not present

## 2018-07-25 DIAGNOSIS — Z86718 Personal history of other venous thrombosis and embolism: Secondary | ICD-10-CM | POA: Diagnosis not present

## 2018-07-25 DIAGNOSIS — K912 Postsurgical malabsorption, not elsewhere classified: Secondary | ICD-10-CM | POA: Diagnosis not present

## 2018-07-25 DIAGNOSIS — R21 Rash and other nonspecific skin eruption: Secondary | ICD-10-CM | POA: Diagnosis not present

## 2018-07-25 DIAGNOSIS — R110 Nausea: Secondary | ICD-10-CM | POA: Diagnosis not present

## 2018-07-25 DIAGNOSIS — Z5181 Encounter for therapeutic drug level monitoring: Secondary | ICD-10-CM | POA: Diagnosis not present

## 2018-07-25 DIAGNOSIS — Z7901 Long term (current) use of anticoagulants: Secondary | ICD-10-CM | POA: Diagnosis not present

## 2018-07-25 DIAGNOSIS — E282 Polycystic ovarian syndrome: Secondary | ICD-10-CM | POA: Diagnosis not present

## 2018-08-04 NOTE — Telephone Encounter (Signed)
Note sent to nurse. 

## 2018-08-06 DIAGNOSIS — R69 Illness, unspecified: Secondary | ICD-10-CM | POA: Diagnosis not present

## 2018-08-18 DIAGNOSIS — R300 Dysuria: Secondary | ICD-10-CM | POA: Diagnosis not present

## 2018-08-18 DIAGNOSIS — R895 Abnormal microbiological findings in specimens from other organs, systems and tissues: Secondary | ICD-10-CM | POA: Diagnosis not present

## 2018-08-18 DIAGNOSIS — K1122 Acute recurrent sialoadenitis: Secondary | ICD-10-CM | POA: Diagnosis not present

## 2018-08-18 DIAGNOSIS — R1903 Right lower quadrant abdominal swelling, mass and lump: Secondary | ICD-10-CM | POA: Diagnosis not present

## 2018-08-18 DIAGNOSIS — M7981 Nontraumatic hematoma of soft tissue: Secondary | ICD-10-CM | POA: Diagnosis not present

## 2018-08-18 DIAGNOSIS — K1121 Acute sialoadenitis: Secondary | ICD-10-CM | POA: Diagnosis not present

## 2018-08-18 DIAGNOSIS — N390 Urinary tract infection, site not specified: Secondary | ICD-10-CM | POA: Diagnosis not present

## 2018-08-18 DIAGNOSIS — S301XXA Contusion of abdominal wall, initial encounter: Secondary | ICD-10-CM | POA: Diagnosis not present

## 2018-08-21 DIAGNOSIS — N979 Female infertility, unspecified: Secondary | ICD-10-CM | POA: Diagnosis not present

## 2018-08-21 DIAGNOSIS — N3010 Interstitial cystitis (chronic) without hematuria: Secondary | ICD-10-CM | POA: Diagnosis not present

## 2018-08-28 DIAGNOSIS — R69 Illness, unspecified: Secondary | ICD-10-CM | POA: Diagnosis not present

## 2018-09-05 DIAGNOSIS — M542 Cervicalgia: Secondary | ICD-10-CM | POA: Diagnosis not present

## 2018-09-05 DIAGNOSIS — R51 Headache: Secondary | ICD-10-CM | POA: Diagnosis not present

## 2018-09-09 DIAGNOSIS — D509 Iron deficiency anemia, unspecified: Secondary | ICD-10-CM | POA: Diagnosis not present

## 2018-09-09 DIAGNOSIS — Z86718 Personal history of other venous thrombosis and embolism: Secondary | ICD-10-CM | POA: Diagnosis not present

## 2018-09-09 DIAGNOSIS — K912 Postsurgical malabsorption, not elsewhere classified: Secondary | ICD-10-CM | POA: Diagnosis not present

## 2018-09-09 DIAGNOSIS — Z7901 Long term (current) use of anticoagulants: Secondary | ICD-10-CM | POA: Diagnosis not present

## 2018-09-09 DIAGNOSIS — E756 Lipid storage disorder, unspecified: Secondary | ICD-10-CM | POA: Diagnosis not present

## 2018-09-09 DIAGNOSIS — Z95828 Presence of other vascular implants and grafts: Secondary | ICD-10-CM | POA: Diagnosis not present

## 2018-09-22 DIAGNOSIS — R5383 Other fatigue: Secondary | ICD-10-CM | POA: Diagnosis not present

## 2018-09-22 DIAGNOSIS — R2233 Localized swelling, mass and lump, upper limb, bilateral: Secondary | ICD-10-CM | POA: Diagnosis not present

## 2018-09-22 DIAGNOSIS — R350 Frequency of micturition: Secondary | ICD-10-CM | POA: Diagnosis not present

## 2018-09-22 DIAGNOSIS — E039 Hypothyroidism, unspecified: Secondary | ICD-10-CM | POA: Diagnosis not present

## 2018-09-22 DIAGNOSIS — D509 Iron deficiency anemia, unspecified: Secondary | ICD-10-CM | POA: Diagnosis not present

## 2018-09-22 DIAGNOSIS — M2550 Pain in unspecified joint: Secondary | ICD-10-CM | POA: Diagnosis not present

## 2018-09-22 DIAGNOSIS — N926 Irregular menstruation, unspecified: Secondary | ICD-10-CM | POA: Diagnosis not present

## 2018-09-23 DIAGNOSIS — R69 Illness, unspecified: Secondary | ICD-10-CM | POA: Diagnosis not present

## 2018-09-26 DIAGNOSIS — R221 Localized swelling, mass and lump, neck: Secondary | ICD-10-CM | POA: Diagnosis not present

## 2018-10-01 DIAGNOSIS — L509 Urticaria, unspecified: Secondary | ICD-10-CM | POA: Diagnosis not present

## 2018-10-01 DIAGNOSIS — Z91018 Allergy to other foods: Secondary | ICD-10-CM | POA: Diagnosis not present

## 2018-10-01 DIAGNOSIS — J301 Allergic rhinitis due to pollen: Secondary | ICD-10-CM | POA: Diagnosis not present

## 2018-10-03 DIAGNOSIS — R5383 Other fatigue: Secondary | ICD-10-CM | POA: Diagnosis not present

## 2018-10-03 DIAGNOSIS — M2550 Pain in unspecified joint: Secondary | ICD-10-CM | POA: Diagnosis not present

## 2018-10-03 DIAGNOSIS — R69 Illness, unspecified: Secondary | ICD-10-CM | POA: Diagnosis not present

## 2018-10-03 DIAGNOSIS — I7300 Raynaud's syndrome without gangrene: Secondary | ICD-10-CM | POA: Diagnosis not present

## 2018-10-13 DIAGNOSIS — M2550 Pain in unspecified joint: Secondary | ICD-10-CM | POA: Diagnosis not present

## 2018-10-13 DIAGNOSIS — R69 Illness, unspecified: Secondary | ICD-10-CM | POA: Diagnosis not present

## 2018-10-13 DIAGNOSIS — R5383 Other fatigue: Secondary | ICD-10-CM | POA: Diagnosis not present

## 2018-10-13 DIAGNOSIS — Z716 Tobacco abuse counseling: Secondary | ICD-10-CM | POA: Diagnosis not present

## 2018-10-13 DIAGNOSIS — I7300 Raynaud's syndrome without gangrene: Secondary | ICD-10-CM | POA: Diagnosis not present

## 2018-10-21 DIAGNOSIS — F329 Major depressive disorder, single episode, unspecified: Secondary | ICD-10-CM | POA: Diagnosis not present

## 2018-10-21 DIAGNOSIS — R69 Illness, unspecified: Secondary | ICD-10-CM | POA: Diagnosis not present

## 2018-10-21 DIAGNOSIS — F419 Anxiety disorder, unspecified: Secondary | ICD-10-CM | POA: Diagnosis not present

## 2018-10-21 DIAGNOSIS — F5105 Insomnia due to other mental disorder: Secondary | ICD-10-CM | POA: Diagnosis not present

## 2018-10-21 DIAGNOSIS — F4310 Post-traumatic stress disorder, unspecified: Secondary | ICD-10-CM | POA: Diagnosis not present

## 2018-10-22 ENCOUNTER — Ambulatory Visit: Payer: Medicare HMO | Admitting: Allergy & Immunology

## 2018-10-23 DIAGNOSIS — Z3201 Encounter for pregnancy test, result positive: Secondary | ICD-10-CM | POA: Diagnosis not present

## 2018-10-23 DIAGNOSIS — R69 Illness, unspecified: Secondary | ICD-10-CM | POA: Diagnosis not present

## 2018-10-29 DIAGNOSIS — O3680X1 Pregnancy with inconclusive fetal viability, fetus 1: Secondary | ICD-10-CM | POA: Diagnosis not present

## 2018-10-29 DIAGNOSIS — Z86718 Personal history of other venous thrombosis and embolism: Secondary | ICD-10-CM | POA: Diagnosis not present

## 2018-10-29 DIAGNOSIS — N912 Amenorrhea, unspecified: Secondary | ICD-10-CM | POA: Diagnosis not present

## 2018-10-29 DIAGNOSIS — O3680X0 Pregnancy with inconclusive fetal viability, not applicable or unspecified: Secondary | ICD-10-CM | POA: Diagnosis not present

## 2018-10-29 DIAGNOSIS — K912 Postsurgical malabsorption, not elsewhere classified: Secondary | ICD-10-CM | POA: Diagnosis not present

## 2018-11-03 DIAGNOSIS — R69 Illness, unspecified: Secondary | ICD-10-CM | POA: Diagnosis not present

## 2018-11-03 DIAGNOSIS — R5383 Other fatigue: Secondary | ICD-10-CM | POA: Diagnosis not present

## 2018-11-03 DIAGNOSIS — R309 Painful micturition, unspecified: Secondary | ICD-10-CM | POA: Diagnosis not present

## 2018-11-03 DIAGNOSIS — I7300 Raynaud's syndrome without gangrene: Secondary | ICD-10-CM | POA: Diagnosis not present

## 2018-11-03 DIAGNOSIS — Z716 Tobacco abuse counseling: Secondary | ICD-10-CM | POA: Diagnosis not present

## 2018-11-03 DIAGNOSIS — M2550 Pain in unspecified joint: Secondary | ICD-10-CM | POA: Diagnosis not present

## 2018-11-03 DIAGNOSIS — Z331 Pregnant state, incidental: Secondary | ICD-10-CM | POA: Diagnosis not present

## 2018-11-14 DIAGNOSIS — O368390 Maternal care for abnormalities of the fetal heart rate or rhythm, unspecified trimester, not applicable or unspecified: Secondary | ICD-10-CM | POA: Diagnosis not present

## 2018-11-14 DIAGNOSIS — O3680X0 Pregnancy with inconclusive fetal viability, not applicable or unspecified: Secondary | ICD-10-CM | POA: Diagnosis not present

## 2018-11-17 DIAGNOSIS — T82594A Other mechanical complication of infusion catheter, initial encounter: Secondary | ICD-10-CM | POA: Diagnosis not present

## 2018-11-17 DIAGNOSIS — Z86718 Personal history of other venous thrombosis and embolism: Secondary | ICD-10-CM | POA: Diagnosis not present

## 2018-11-17 DIAGNOSIS — Z452 Encounter for adjustment and management of vascular access device: Secondary | ICD-10-CM | POA: Diagnosis not present

## 2018-11-20 DIAGNOSIS — O3680X0 Pregnancy with inconclusive fetal viability, not applicable or unspecified: Secondary | ICD-10-CM | POA: Diagnosis not present

## 2018-11-20 DIAGNOSIS — O368390 Maternal care for abnormalities of the fetal heart rate or rhythm, unspecified trimester, not applicable or unspecified: Secondary | ICD-10-CM | POA: Diagnosis not present

## 2018-11-25 DIAGNOSIS — O3680X0 Pregnancy with inconclusive fetal viability, not applicable or unspecified: Secondary | ICD-10-CM | POA: Diagnosis not present

## 2018-11-25 DIAGNOSIS — Z86718 Personal history of other venous thrombosis and embolism: Secondary | ICD-10-CM | POA: Diagnosis not present

## 2018-11-25 DIAGNOSIS — Z1159 Encounter for screening for other viral diseases: Secondary | ICD-10-CM | POA: Diagnosis not present

## 2018-11-25 DIAGNOSIS — Z01818 Encounter for other preprocedural examination: Secondary | ICD-10-CM | POA: Diagnosis not present

## 2018-11-25 DIAGNOSIS — O021 Missed abortion: Secondary | ICD-10-CM | POA: Diagnosis not present

## 2018-11-26 DIAGNOSIS — Z01818 Encounter for other preprocedural examination: Secondary | ICD-10-CM | POA: Diagnosis not present

## 2018-11-26 DIAGNOSIS — Z1159 Encounter for screening for other viral diseases: Secondary | ICD-10-CM | POA: Diagnosis not present

## 2018-11-27 DIAGNOSIS — Z8759 Personal history of other complications of pregnancy, childbirth and the puerperium: Secondary | ICD-10-CM | POA: Diagnosis not present

## 2018-11-27 DIAGNOSIS — O021 Missed abortion: Secondary | ICD-10-CM | POA: Diagnosis not present

## 2018-11-27 DIAGNOSIS — O3680X0 Pregnancy with inconclusive fetal viability, not applicable or unspecified: Secondary | ICD-10-CM | POA: Diagnosis not present

## 2018-12-02 DIAGNOSIS — R69 Illness, unspecified: Secondary | ICD-10-CM | POA: Diagnosis not present

## 2018-12-12 DIAGNOSIS — R69 Illness, unspecified: Secondary | ICD-10-CM | POA: Diagnosis not present

## 2018-12-17 DIAGNOSIS — R69 Illness, unspecified: Secondary | ICD-10-CM | POA: Diagnosis not present

## 2019-01-02 ENCOUNTER — Other Ambulatory Visit: Payer: Self-pay | Admitting: Nurse Practitioner

## 2019-04-29 IMAGING — DX DG FOOT COMPLETE 3+V*R*
3 series · 3 of 3 positions shown · non-contrast
Comparison: None.

CLINICAL DATA: Acute right foot pain following fall 3 days ago.
Initial encounter.

EXAM:
RIGHT FOOT COMPLETE - 3+ VIEW

[foot ap]
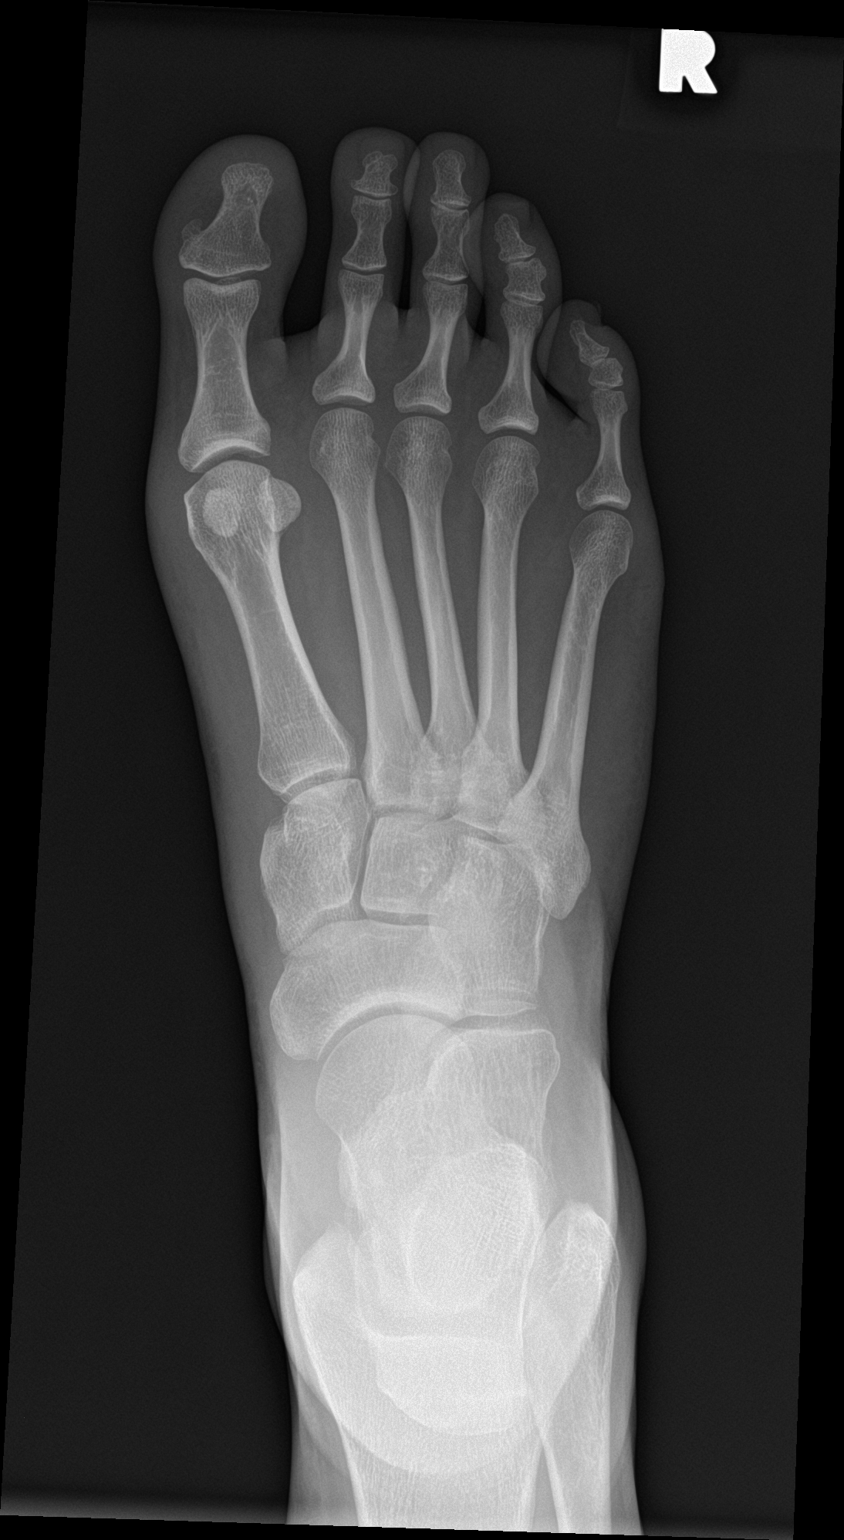

[foot obl]
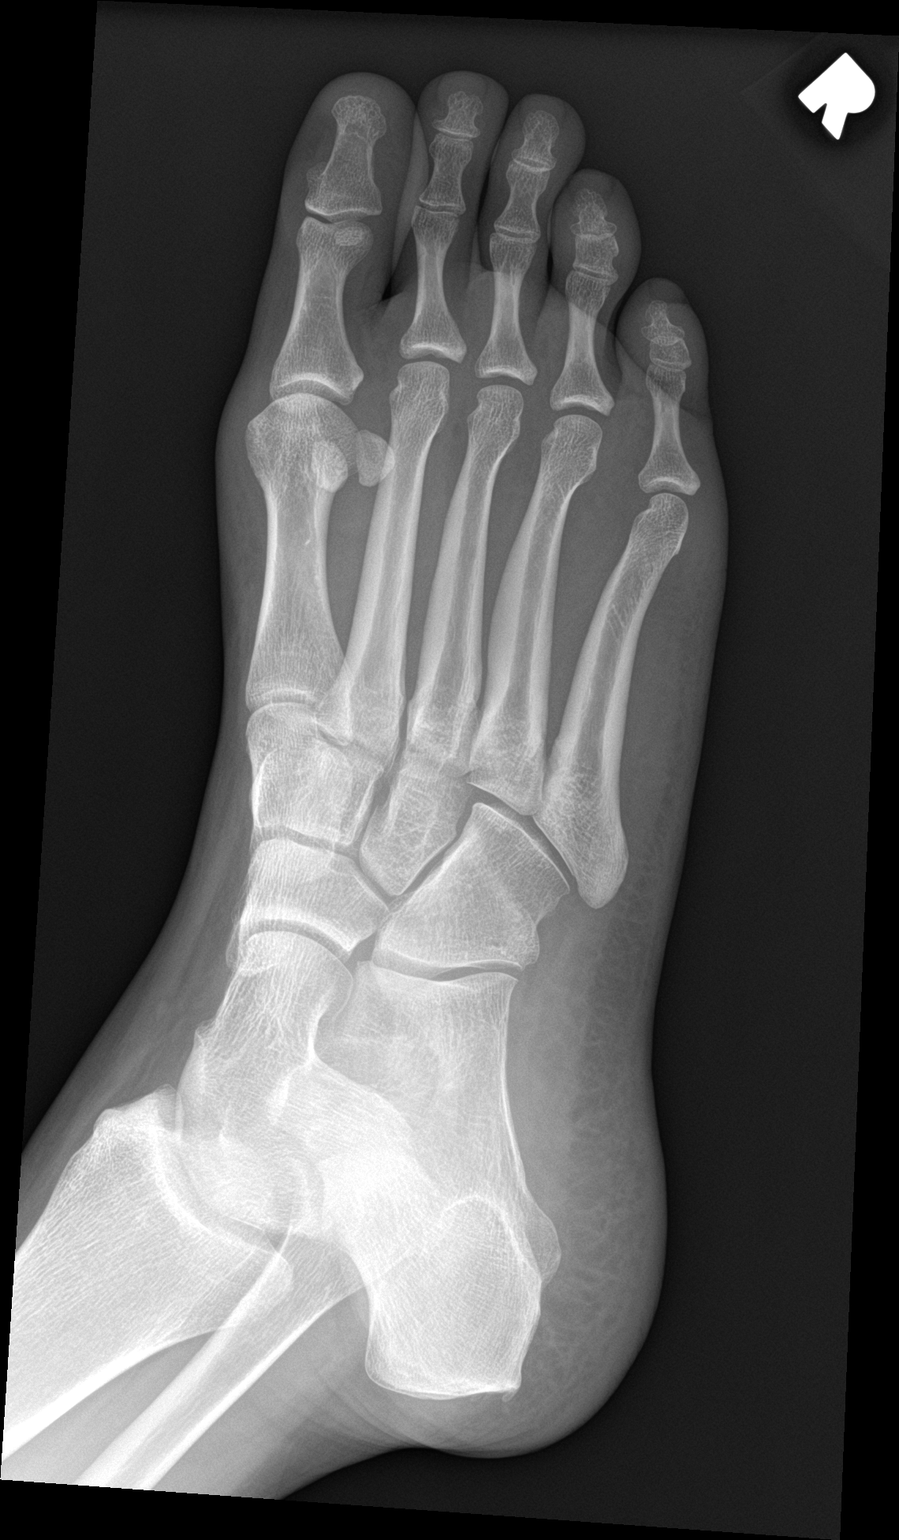

[foot lat]
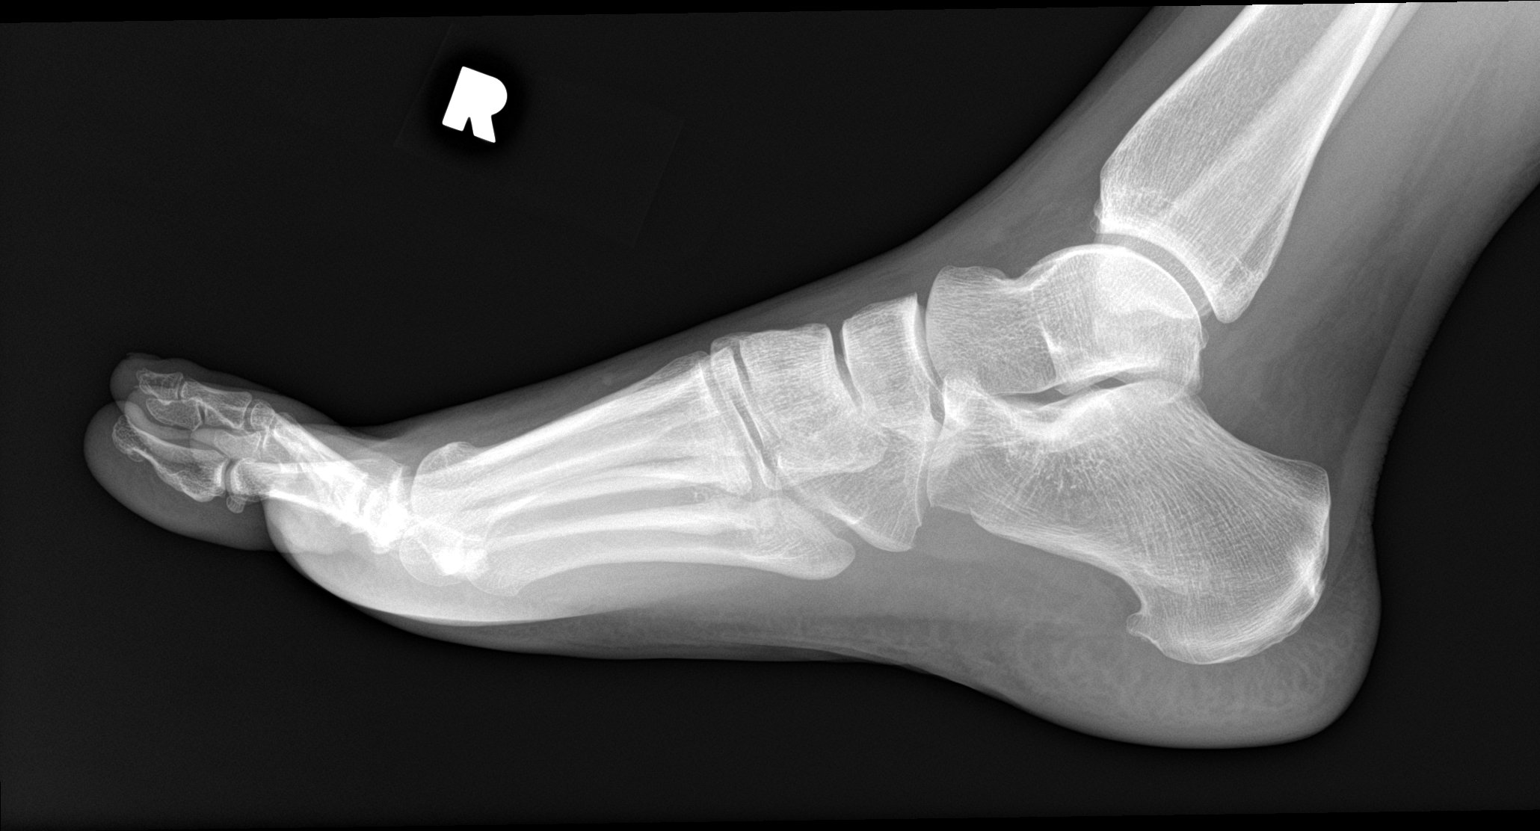

[3 of 3 positions shown; findings below may reference images not displayed]

FINDINGS: There is no evidence of fracture or dislocation. There is no
evidence of arthropathy or other focal bone abnormality. Soft
tissues are unremarkable.
IMPRESSION: Negative.

## 2019-05-15 ENCOUNTER — Other Ambulatory Visit: Payer: Self-pay | Admitting: Allergy & Immunology

## 2019-07-06 ENCOUNTER — Encounter: Payer: Self-pay | Admitting: Family Medicine

## 2019-12-04 ENCOUNTER — Ambulatory Visit (LOCAL_COMMUNITY_HEALTH_CENTER): Payer: Medicare HMO

## 2019-12-04 ENCOUNTER — Ambulatory Visit (INDEPENDENT_AMBULATORY_CARE_PROVIDER_SITE_OTHER): Payer: Medicare HMO | Admitting: Nurse Practitioner

## 2019-12-04 ENCOUNTER — Other Ambulatory Visit: Payer: Self-pay

## 2019-12-04 VITALS — BP 128/72 | Temp 98.4°F | Wt 135.2 lb

## 2019-12-04 DIAGNOSIS — Z23 Encounter for immunization: Secondary | ICD-10-CM

## 2019-12-04 DIAGNOSIS — O8600 Infection of obstetric surgical wound, unspecified: Secondary | ICD-10-CM | POA: Diagnosis not present

## 2019-12-04 MED ORDER — FLUCONAZOLE 150 MG PO TABS: ORAL_TABLET | ORAL | 0 refills | Status: DC

## 2019-12-04 MED ORDER — PROMETHAZINE HCL 25 MG PO TABS: ORAL_TABLET | ORAL | 2 refills | Status: DC

## 2019-12-04 MED ORDER — CLINDAMYCIN HCL 300 MG PO CAPS: 300.0000 mg | ORAL_CAPSULE | Freq: Three times a day (TID) | ORAL | 0 refills | Status: DC

## 2019-12-04 NOTE — Progress Notes (Signed)
Pt requesting MMR today d/t Rubella titer equivocal. Also states she needs MMR before her son is d/c from NICU. Pt. denies any allergy to eggs. Reports she eats eggs without problem. Also denies allergy to neomycin and gelatin. Pt. Explains she had a high risk preg. and delivered via c-section 10/29/19 and was diagnosed with Factor 5 during pregnancy. Currently on Lovenox and will take for one more week. Denies thrombocytopenia.  Consulted with Vertell Novak, MD  By phone who reviewed allergies and also reviewed hx and current health status and advises to proceed with MMR vaccine today. MMR given without difficulty. Updated [facility] copy given to pt. Josie Saunders, RN

## 2019-12-04 NOTE — Progress Notes (Signed)
Subjective:    Patient ID: [name], female    DOB: 05-21-1981, 39 y.o.   MRN: [id number]  HPI  Patient arrives for a follow up on blood pressure-Patient states the OB told her she could stop her Procardia. Patient states she also thinks her C section scar is infected and needs an antibiotic. Being followed by several specialists at [facility]. Completed her Bactrim for her Cesarean wound infection but has noticed green drainage with an odor. Is being followed by home health for her wound care, they recommended a recheck. Contacted her OB/GYN today and they could not see her in the office. Advised her to go to ED. Patient did not want to wait for hours to be seen. No fever. Has a small deep wound with packing.  No fever. Has a history of chronic nausea and vomiting. No change. Requesting refill of Phenergan. Also states she has severe anemia which she thinks may have delayed healing. Has an appointment for iron injection.   Review of Systems     Objective:   Physical Exam NAD. Alert, oriented. Lungs clear. Heart RRR. Dressing removed from the left outer edge of her C-section. A small area of packing was noted. This was removed without difficulty. Small amount of light yellow-green drainage noted with a very foul odor noted even with wearing a mask. Patient brought her own supplies for would care. Irrigated wound. Tissue that can be observed is pink. No erythema around opening. Firm area about 1 cm in diameter around the opening. Area is non tender but patient states she cannot feel anything around incision. Her surgeon is aware of this.  Packing replaced, sterile gauze and clear covering applied.        Assessment & Plan:  Cesarean wound infection  Given Rx for Clindamycin. Start daily probiotic as directed. Concerned about the amount of antibiotics she has taken long term. Watch for any excessive diarrhea. Continue home health, wound care and follow up with her surgeon at [facility] if any  further issues. Warning signs reviewed. Refill on Phenergan given. Diflucan per patient request.

## 2019-12-05 ENCOUNTER — Telehealth: Payer: Self-pay | Admitting: Nurse Practitioner

## 2019-12-05 MED ORDER — DOXYCYCLINE HYCLATE 100 MG PO TABS: 100.0000 mg | ORAL_TABLET | Freq: Two times a day (BID) | ORAL | 0 refills | Status: DC

## 2019-12-05 NOTE — Telephone Encounter (Signed)
Phone call from patient that she received my chart message from [place] that her urine culture results were in and thinks it shows infection. Rx for Clindamycin written yesterday for her wound infection. Results reviewed in Care Everywhere. Switched to Doxycyline to cover both UTI and wound. Advised patient to be careful about sun exposure and take with plenty of fluids. Contact her providers with [facility] if any further issues with UTI or wound.

## 2019-12-06 ENCOUNTER — Encounter: Payer: Self-pay | Admitting: Nurse Practitioner

## 2019-12-25 ENCOUNTER — Other Ambulatory Visit: Payer: Self-pay

## 2019-12-25 ENCOUNTER — Ambulatory Visit (INDEPENDENT_AMBULATORY_CARE_PROVIDER_SITE_OTHER): Payer: Medicare HMO | Admitting: Nurse Practitioner

## 2019-12-25 ENCOUNTER — Encounter: Payer: Self-pay | Admitting: Nurse Practitioner

## 2019-12-25 VITALS — BP 154/98 | Temp 99.1°F | Wt 129.2 lb

## 2019-12-25 DIAGNOSIS — O8600 Infection of obstetric surgical wound, unspecified: Secondary | ICD-10-CM

## 2019-12-25 DIAGNOSIS — R110 Nausea: Secondary | ICD-10-CM

## 2019-12-25 DIAGNOSIS — R21 Rash and other nonspecific skin eruption: Secondary | ICD-10-CM

## 2019-12-25 DIAGNOSIS — R34 Anuria and oliguria: Secondary | ICD-10-CM

## 2019-12-25 LAB — POCT URINALYSIS DIPSTICK (MANUAL)
Nitrite, UA: NEGATIVE
Poct Blood: NEGATIVE
Poct Glucose: NORMAL mg/dL
Poct Ketones: NEGATIVE
Poct Protein: NEGATIVE mg/dL
Spec Grav, UA: <=1.005 — AB (ref 1.010–1.025)
pH, UA: 6 (ref 5.0–8.0)

## 2019-12-25 MED ORDER — TRIAMCINOLONE ACETONIDE 0.1 % EX CREA
1.0000 | TOPICAL_CREAM | Freq: Two times a day (BID) | CUTANEOUS | 0 refills | Status: DC
Start: 2019-12-25 — End: 2019-12-26

## 2019-12-25 MED ORDER — KETOCONAZOLE 2 % EX CREA
1.0000 | TOPICAL_CREAM | Freq: Two times a day (BID) | CUTANEOUS | 4 refills | Status: DC
Start: 2019-12-25 — End: 2019-12-26

## 2019-12-25 MED ORDER — PROCHLORPERAZINE MALEATE 5 MG PO TABS: 5.0000 mg | ORAL_TABLET | Freq: Four times a day (QID) | ORAL | 0 refills | Status: DC | PRN

## 2019-12-25 MED ORDER — PROMETHAZINE HCL 25 MG/ML IJ SOLN
25.0000 mg | Freq: Once | INTRAMUSCULAR | Status: AC
  Administered 2019-12-25: 25 mg via INTRAMUSCULAR

## 2019-12-25 NOTE — Progress Notes (Signed)
Subjective:    Patient ID: [name], female    DOB: [birth year], 39 y.o.   MRN: [id number]  HPI Patient comes in today to have cesarean wound rechecked.   Patient also complains of a rash in her belly button.   Patient reports nausea and vomiting x 1 week, worse today. Has a complex history of GI issues including N/V. Takes Phenergan several times a day with no drowsiness. Does not seem to be helping. Feels flushed. Decreased urination but no dysuria or urgency. No documented fever. No flank or lower abdominal pain. Has a complex urinary history. Tends to get UTI with prolonged nausea. Trying to drink fluids but decreased food intake. States she has lost 10 lbs over the past week. Sees several specialists at [facility].     Review of Systems     Objective:   Physical Exam NAD. Alert, oriented. Face is flushed. Lungs clear. Heart RRR. Has a tiny open area left side of C-section scar. Minimal bleeding. No drainage. A very small piece of packing removed. Wound is healing well. Firm area noted with healing. No erythema or tenderness. Results for orders placed or performed in visit on 12/25/19  POCT Urinalysis Dip Manual  Result Value Ref Range   Spec Grav, UA <=1.005 (A) 1.010 - 1.025   pH, UA 6.0 5.0 - 8.0   Leukocytes, UA Moderate (2+) (A) Negative   Nitrite, UA Negative Negative   Poct Protein Negative Negative, trace mg/dL   Poct Glucose Normal Normal mg/dL   Poct Ketones Negative Negative   Poct Urobilinogen     Poct Bilirubin     Poct Blood Negative Negative, trace   Urine micro: rare epithelial; rare WBC. Urine very dilute.  Several discrete erythematous papules at the umbilical area. A few other discrete papules noted on abdomen and left leg.       Assessment & Plan:  Nausea - Plan: POCT Urinalysis Dip Manual, promethazine (PHENERGAN) injection 25 mg  Cesarean wound infection  Decreased urination - Plan: Urine Culture  Rash and nonspecific skin eruption   Meds  ordered this encounter  Medications   prochlorperazine (COMPAZINE) 5 MG tablet    Sig: Take 1 tablet (5 mg total) by mouth every 6 (six) hours as needed for nausea or vomiting. Do not take with Phenergan    Dispense:  30 tablet    Refill:  0    Order Specific Question:   Supervising Provider    Answer:   Lilyan Punt A [[id number]]   ketoconazole (NIZORAL) 2 % cream    Sig: Apply 1 application topically 2 (two) times daily. To rash at umbilical area prn    Dispense:  30 g    Refill:  4    Order Specific Question:   Supervising Provider    Answer:   Lilyan Punt A [[id number]]   triamcinolone cream (KENALOG) 0.1 %    Sig: Apply 1 application topically 2 (two) times daily. Prn rash; use up to 2 weeks    Dispense:  30 g    Refill:  0    Order Specific Question:   Supervising Provider    Answer:   Lilyan Punt A [[id number]]   promethazine (PHENERGAN) injection 25 mg   Urine culture pending. To seek help at ED this weekend if worse. Phenergan injection. Denies any drowsiness with med. Use combination of ketoconazole and triamcinolone to rash. Call back if persists.  No further packing in surgical wound. Bandaid applied. Expect continued  gradual resolution.

## 2019-12-26 ENCOUNTER — Encounter: Payer: Self-pay | Admitting: Nurse Practitioner

## 2019-12-26 ENCOUNTER — Other Ambulatory Visit: Payer: Self-pay | Admitting: Nurse Practitioner

## 2019-12-26 MED ORDER — TRIAMCINOLONE ACETONIDE 0.1 % EX CREA: 1.0000 "application " | TOPICAL_CREAM | Freq: Two times a day (BID) | CUTANEOUS | 0 refills | Status: DC

## 2019-12-26 MED ORDER — KETOCONAZOLE 2 % EX CREA: 1.0000 "application " | TOPICAL_CREAM | Freq: Two times a day (BID) | CUTANEOUS | 4 refills | Status: DC

## 2019-12-26 MED ORDER — PROCHLORPERAZINE MALEATE 5 MG PO TABS: 5.0000 mg | ORAL_TABLET | Freq: Four times a day (QID) | ORAL | 0 refills | Status: DC | PRN

## 2019-12-27 LAB — URINE CULTURE

## 2020-01-04 ENCOUNTER — Other Ambulatory Visit: Payer: Self-pay | Admitting: Nurse Practitioner

## 2020-01-04 ENCOUNTER — Encounter: Payer: Self-pay | Admitting: Nurse Practitioner

## 2020-01-04 MED ORDER — PROCHLORPERAZINE MALEATE 5 MG PO TABS: 5.0000 mg | ORAL_TABLET | Freq: Four times a day (QID) | ORAL | 0 refills | Status: DC | PRN

## 2020-02-02 ENCOUNTER — Other Ambulatory Visit: Payer: Self-pay | Admitting: Nurse Practitioner

## 2020-02-27 ENCOUNTER — Other Ambulatory Visit: Payer: Self-pay | Admitting: Nurse Practitioner

## 2020-02-27 MED ORDER — AMOXICILLIN-POT CLAVULANATE 875-125 MG PO TABS: 1.0000 | ORAL_TABLET | Freq: Two times a day (BID) | ORAL | 0 refills | Status: DC

## 2020-02-27 NOTE — Progress Notes (Signed)
Patient contacted NP regarding infection in her left lower inner lip following a dental procedure this week. Has taken Valtrex with no relief. Does not look like her usual fever blister. Slowly gotten larger in size. Tender. Will send in antibiotics. Warm compresses. If area spreads or if fever, go to Urgent Care. Otherwise, contact dentist or our office next week.

## 2020-02-29 ENCOUNTER — Ambulatory Visit (HOSPITAL_COMMUNITY)
Admission: RE | Admit: 2020-02-29 | Discharge: 2020-02-29 | Disposition: A | Discharge status: Home / Self Care | Payer: Medicare HMO | Source: Ambulatory Visit | Attending: Family Medicine | Admitting: Family Medicine

## 2020-02-29 ENCOUNTER — Ambulatory Visit (INDEPENDENT_AMBULATORY_CARE_PROVIDER_SITE_OTHER): Payer: Medicare HMO | Admitting: Family Medicine

## 2020-02-29 ENCOUNTER — Encounter: Payer: Self-pay | Admitting: Family Medicine

## 2020-02-29 ENCOUNTER — Other Ambulatory Visit: Payer: Self-pay

## 2020-02-29 VITALS — BP 132/88 | HR 96 | Temp 98.0°F | Wt 147.8 lb

## 2020-02-29 DIAGNOSIS — K130 Diseases of lips: Secondary | ICD-10-CM | POA: Insufficient documentation

## 2020-02-29 DIAGNOSIS — R590 Localized enlarged lymph nodes: Secondary | ICD-10-CM | POA: Diagnosis not present

## 2020-02-29 DIAGNOSIS — K122 Cellulitis and abscess of mouth: Secondary | ICD-10-CM

## 2020-02-29 DIAGNOSIS — R197 Diarrhea, unspecified: Secondary | ICD-10-CM

## 2020-02-29 MED ORDER — OXYCODONE-ACETAMINOPHEN 5-325 MG PO TABS
1.0000 | ORAL_TABLET | Freq: Four times a day (QID) | ORAL | 0 refills | Status: AC | PRN
Start: 2020-02-29 — End: 2020-03-05

## 2020-02-29 MED ORDER — OXYCODONE-ACETAMINOPHEN 5-325 MG PO TABS: 1.0000 | ORAL_TABLET | Freq: Four times a day (QID) | ORAL | 0 refills | Status: DC | PRN

## 2020-02-29 MED ORDER — VALACYCLOVIR HCL 1 G PO TABS: 1000.0000 mg | ORAL_TABLET | Freq: Three times a day (TID) | ORAL | 0 refills | Status: AC

## 2020-02-29 MED ORDER — DOXYCYCLINE HYCLATE 100 MG PO TABS
100.0000 mg | ORAL_TABLET | Freq: Two times a day (BID) | ORAL | 0 refills | Status: DC
Start: 2020-02-29 — End: 2020-03-03

## 2020-02-29 MED ORDER — CEFTRIAXONE SODIUM 500 MG IJ SOLR
500.0000 mg | Freq: Once | INTRAMUSCULAR | Status: AC
  Administered 2020-02-29: 500 mg via INTRAMUSCULAR

## 2020-02-29 NOTE — Progress Notes (Signed)
Subjective:    Patient ID: [name], female    DOB: [birth year], 39 y.o.   MRN: [id number]  HPI Patient complains of swollen bottom lip that started Thursday.  Reports swollen lymph nodes on left side as well as pain on entire left side of face.  Has been taking Augmentin since Saturday. This patient relates t that she started having swollen left lower lip with painful and discomfort radiating into the side of her face also into the lymph nodes.  Denies high fevers sweats chills.  Denies difficulty breathing it does hurt to swallow.  Denies any new medicines.  No history of cold sores Had dental work on the right side several days ago but not felt to be contributing to this no prior history of this  Review of Systems Please see above    Objective:   Physical Exam Lungs clear respiratory rate normal heart regular no murmurs lymphadenopathy noted on the left side not extensive left lower lip swollen tender with pustules underneath the left lower lip eardrums are normal facial region slightly swollen around this but no sign of any extensive infection       Assessment & Plan:  Lip infection Cellulitis Could be developing abscess Need to cover for the possibility of cold sore as well Valtrex for 7 days Continue Augmentin Add doxycycline to cover for the possibility of MRSA Skin culture taken Ultrasound of the lip ordered If this does show an abscess then she will need referral to ENT Follow-up 48 hours Warm compresses frequently Rocephin 500 IM

## 2020-03-01 ENCOUNTER — Ambulatory Visit (INDEPENDENT_AMBULATORY_CARE_PROVIDER_SITE_OTHER): Payer: Medicare HMO | Admitting: Family Medicine

## 2020-03-01 ENCOUNTER — Encounter: Payer: Self-pay | Admitting: Family Medicine

## 2020-03-01 ENCOUNTER — Telehealth: Payer: Self-pay

## 2020-03-01 VITALS — BP 130/88 | Temp 97.8°F

## 2020-03-01 DIAGNOSIS — K130 Diseases of lips: Secondary | ICD-10-CM | POA: Diagnosis not present

## 2020-03-01 MED ORDER — CLINDAMYCIN HCL 300 MG PO CAPS: 300.0000 mg | ORAL_CAPSULE | Freq: Three times a day (TID) | ORAL | 0 refills | Status: DC

## 2020-03-01 NOTE — Progress Notes (Signed)
Subjective:    Patient ID: [name], female    DOB: 1981-01-24, 39 y.o.   MRN: [id number]  HPI Patient comes in today to follow up on swollen bottom lip/cellulitis. Patient woke up in the middle of the night and brushed her lip and it started draining yellow and green discharge.  Patient in for recheck of the lip Pain and discomfort with drainage of pus Had ultrasound yesterday which showed more of a phlegmon but did not show a specific abscess She is having frequent diarrhea with the current antibiotics No mucus or blood  Review of Systems Please see above    Objective:   Physical Exam Tender swollen lymph.  Very tender lymphadenopathy Airway is normal Lungs clear heart regular       Assessment & Plan:  Suspected Covid infection Phlegmon Cellulitis lower lip Lymphadenopathy noted as well Referral to ENT for further evaluation  Will stop Augmentin I believe this is contributing to her diarrhea I would recommend clindamycin 3 times daily along with the doxycycline to cover for MRSA Culture taken the other day was negative but this is not unexpected because she was already on Augmentin at the time that this culture was taken She will see ENT this week It will be up to them if they recommend any type of surgical drainage

## 2020-03-01 NOTE — Telephone Encounter (Signed)
Pt supposed to have referral to ENT in [place] and check with [place] and no referral in the system.  Pt call back [phone]

## 2020-03-01 NOTE — Telephone Encounter (Signed)
Referral placed and pt has appt with Dr.Teoh tomorrow at 2:50 per [facility]. Pt notified.

## 2020-03-01 NOTE — Telephone Encounter (Signed)
Spoke with provider regarding pt. Provider would like patient to come to office at 11:30 am. Pt contacted and verbalized understanding.

## 2020-03-02 ENCOUNTER — Encounter: Payer: Self-pay | Admitting: *Deleted

## 2020-03-02 ENCOUNTER — Ambulatory Visit: Payer: Medicare HMO | Admitting: Family Medicine

## 2020-03-02 ENCOUNTER — Encounter: Payer: Self-pay | Admitting: Family Medicine

## 2020-03-02 ENCOUNTER — Other Ambulatory Visit: Payer: Self-pay | Admitting: *Deleted

## 2020-03-02 MED ORDER — FLUCONAZOLE 200 MG PO TABS: 200.0000 mg | ORAL_TABLET | Freq: Every day | ORAL | 0 refills | Status: DC

## 2020-03-02 NOTE — Telephone Encounter (Signed)
Because of the current issues I would recommend Diflucan 200 mg one daily, #7  In the future after she finishes this she can connect with [place] and we will send in the traditional 150 mg one when necessary but for now we will send in the 200 it would do a quicker job for her for this situation with her still being on antibiotics

## 2020-03-03 ENCOUNTER — Other Ambulatory Visit: Payer: Self-pay | Admitting: *Deleted

## 2020-03-03 LAB — WOUND CULTURE: Organism ID, Bacteria: NONE SEEN

## 2020-03-03 MED ORDER — SULFAMETHOXAZOLE-TRIMETHOPRIM 800-160 MG PO TABS: 1.0000 | ORAL_TABLET | Freq: Two times a day (BID) | ORAL | 0 refills | Status: DC

## 2020-03-03 NOTE — Addendum Note (Signed)
Addended by: [name] on: 03/03/2020 01:33 PM   Modules accepted: Orders

## 2020-03-05 LAB — CLOSTRIDIUM DIFFICILE BY PCR: Toxigenic C. Difficile by PCR: NEGATIVE

## 2020-03-07 ENCOUNTER — Other Ambulatory Visit: Payer: Self-pay | Admitting: Nurse Practitioner

## 2020-03-07 ENCOUNTER — Encounter: Payer: Self-pay | Admitting: Family Medicine

## 2020-03-07 ENCOUNTER — Ambulatory Visit (INDEPENDENT_AMBULATORY_CARE_PROVIDER_SITE_OTHER): Payer: Medicare HMO | Admitting: Family Medicine

## 2020-03-07 ENCOUNTER — Other Ambulatory Visit: Payer: Self-pay

## 2020-03-07 VITALS — BP 112/80 | HR 86 | Temp 97.8°F | Wt 146.4 lb

## 2020-03-07 DIAGNOSIS — K521 Toxic gastroenteritis and colitis: Secondary | ICD-10-CM | POA: Diagnosis not present

## 2020-03-07 DIAGNOSIS — A4902 Methicillin resistant Staphylococcus aureus infection, unspecified site: Secondary | ICD-10-CM

## 2020-03-07 DIAGNOSIS — K130 Diseases of lips: Secondary | ICD-10-CM | POA: Diagnosis not present

## 2020-03-07 DIAGNOSIS — T3695XA Adverse effect of unspecified systemic antibiotic, initial encounter: Secondary | ICD-10-CM

## 2020-03-07 MED ORDER — MUPIROCIN 2 % EX OINT: 1.0000 "application " | TOPICAL_OINTMENT | Freq: Two times a day (BID) | CUTANEOUS | 0 refills | Status: DC
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
## 2020-04-08 ENCOUNTER — Ambulatory Visit (INDEPENDENT_AMBULATORY_CARE_PROVIDER_SITE_OTHER): Payer: Medicare HMO | Admitting: Nurse Practitioner

## 2020-04-08 ENCOUNTER — Encounter: Payer: Self-pay | Admitting: Nurse Practitioner

## 2020-04-08 ENCOUNTER — Other Ambulatory Visit: Payer: Self-pay

## 2020-04-08 VITALS — BP 124/82 | HR 79 | Temp 97.4°F | Wt 143.2 lb

## 2020-04-08 DIAGNOSIS — G43009 Migraine without aura, not intractable, without status migrainosus: Secondary | ICD-10-CM

## 2020-04-08 DIAGNOSIS — L659 Nonscarring hair loss, unspecified: Secondary | ICD-10-CM

## 2020-04-08 MED ORDER — TOPIRAMATE 50 MG PO TABS: ORAL_TABLET | ORAL | 0 refills | Status: DC

## 2020-04-08 NOTE — Progress Notes (Signed)
Subjective:    Patient ID: [name], female    DOB: [birth year], 39 y.o.   MRN: [id number]  HPI Patient reports migraines that last the majority of the day every day with nausea x 3.5 weeks. Has a remote history of migraines. Reports headache across the eyes and forehead but mainly in the temples. Describes as a throbbing or pounding sensation. Pain is continuous and OTC mediations have been ineffective.    Also has noticed hair loss recently.  Noticed both problems with recent increase in stress. Is followed by several specialists.  Thinks headaches are due to lack of sleep due to newborn infant that was premature and requiring regular feedings.   Review of Systems  Constitutional: Positive for fatigue. Negative for chills and fever.  Eyes: Negative for photophobia and visual disturbance.  Respiratory: Negative for cough, chest tightness, shortness of breath and wheezing.   Cardiovascular: Negative for chest pain and palpitations.  Gastrointestinal: Positive for nausea. Negative for abdominal distention, abdominal pain, constipation, diarrhea and vomiting.       N associated with continuous HA.   Endocrine: Positive for cold intolerance.  Neurological: Positive for headaches. Negative for dizziness, syncope, weakness, light-headedness and numbness.       Continuous HA for 3 weeks. OTC medications not helping.   Psychiatric/Behavioral: Positive for sleep disturbance. Negative for self-injury and suicidal ideas. The patient is nervous/anxious.        Very busy with newborn at home, unable to sleep. Psych following for management   No visual changes. No difficulty speaking during headaches.      Objective:   Physical Exam Constitutional:      Appearance: Normal appearance. She is well-groomed. She is not ill-appearing.  Eyes:     General: No visual field deficit.    Extraocular Movements: Extraocular movements intact.     Pupils: Pupils are equal, round, and reactive to  light.  Neck:     Thyroid: No thyroid mass, thyromegaly or thyroid tenderness.  Cardiovascular:     Rate and Rhythm: Normal rate and regular rhythm.     Pulses: Normal pulses.     Heart sounds: Normal heart sounds. No murmur heard.  No gallop.   Pulmonary:     Effort: Pulmonary effort is normal. No respiratory distress.     Breath sounds: Normal breath sounds. No wheezing or rhonchi.  Lymphadenopathy:     Cervical: No cervical adenopathy.  Skin:    General: Skin is warm and dry.  Neurological:     Mental Status: She is alert.     Cranial Nerves: No facial asymmetry.     Sensory: Sensation is intact.     Motor: Motor function is intact. No weakness.     Coordination: Coordination is intact. Romberg sign negative. Finger-Nose-Finger Test normal.     Gait: Gait is intact.     Deep Tendon Reflexes:     Reflex Scores:      Bicep reflexes are 2+ on the right side and 2+ on the left side.      Patellar reflexes are 2+ on the right side and 2+ on the left side.    Comments: PERRLA, EOM's normal  Psychiatric:        Mood and Affect: Mood normal.        Behavior: Behavior normal.        Thought Content: Thought content normal.    Vitals:   04/08/20 1333  BP: 124/82  Pulse: 79  Temp: Marland Kitchen)  97.4 F (36.3 C)  SpO2: 100%      Assessment & Plan:   Problem List Items Addressed This Visit      Cardiovascular and Mediastinum   Migraine headache - Primary   Relevant Medications   topiramate (TOPAMAX) 50 MG tablet    Other Visit Diagnoses    Hair loss       Relevant Orders   B12   Ferritin   CBC with Differential/Platelet      Meds ordered this encounter  Medications  . topiramate (TOPAMAX) 50 MG tablet    Sig: Take half tab PO QHS for 4 days, then 1 tab PO QHS, for migraines    Dispense:  30 tablet    Refill:  0    Order Specific Question:   Supervising Provider    Answer:   Lilyan Punt A [[id number]]  Take half a tab of the Topiramate PO daily for 4 days, then 1 tab PO  daily thereafter.  Her recent TSH was normal. Hormones stable with Mirena which she has used before without hair loss. Labs pending. Most likely due to stress. Specialists are following her major health issues.  Encouraged patient to do stress relieving exercises to help reduce symptoms. Stop taking OTC medications regularly such as NSAIDS, this can induce rebound headaches. If symptoms worsen or persist, contact the office. Warning signs reviewed.  Return in about 3 months (around 07/09/2020) for Migraines.  30 minutes was spent with the patient including previsit chart review, time spent with patient, discussion of health issues, review of data including medical record, and documentation of the visit.

## 2020-04-09 LAB — CBC WITH DIFFERENTIAL/PLATELET
Basophils Absolute: 0.1 10*3/uL (ref 0.0–0.2)
Basos: 1 %
EOS (ABSOLUTE): 0.1 10*3/uL (ref 0.0–0.4)
Eos: 1 %
Hematocrit: 45.9 % (ref 34.0–46.6)
Hemoglobin: 15.3 g/dL (ref 11.1–15.9)
Immature Grans (Abs): 0 10*3/uL (ref 0.0–0.1)
Immature Granulocytes: 0 %
Lymphocytes Absolute: 2.4 10*3/uL (ref 0.7–3.1)
Lymphs: 31 %
MCH: 30.7 pg (ref 26.6–33.0)
MCHC: 33.3 g/dL (ref 31.5–35.7)
MCV: 92 fL (ref 79–97)
Monocytes Absolute: 0.4 10*3/uL (ref 0.1–0.9)
Monocytes: 5 %
Neutrophils Absolute: 4.8 10*3/uL (ref 1.4–7.0)
Neutrophils: 62 %
Platelets: 260 10*3/uL (ref 150–450)
RBC: 4.98 x10E6/uL (ref 3.77–5.28)
RDW: 12.8 % (ref 11.7–15.4)
WBC: 7.7 10*3/uL (ref 3.4–10.8)

## 2020-04-09 LAB — FERRITIN: Ferritin: 56 ng/mL (ref 15–150)

## 2020-04-09 LAB — VITAMIN B12: Vitamin B-12: 287 pg/mL (ref 232–1245)

## 2020-04-09 NOTE — Progress Notes (Signed)
Subjective:    Patient ID: [name], female    DOB: [birth year], 39 y.o.   MRN: [id number]  HPI    Review of Systems     Objective:   Physical Exam        Assessment & Plan:

## 2020-04-11 ENCOUNTER — Encounter: Payer: Self-pay | Admitting: Nurse Practitioner

## 2020-04-12 ENCOUNTER — Other Ambulatory Visit: Payer: Self-pay | Admitting: Nurse Practitioner

## 2020-04-12 DIAGNOSIS — E559 Vitamin D deficiency, unspecified: Secondary | ICD-10-CM

## 2020-04-12 DIAGNOSIS — K912 Postsurgical malabsorption, not elsewhere classified: Secondary | ICD-10-CM

## 2020-04-21 ENCOUNTER — Other Ambulatory Visit: Payer: Medicare HMO

## 2020-04-26 ENCOUNTER — Other Ambulatory Visit (HOSPITAL_COMMUNITY): Payer: Medicare Other

## 2020-04-26 ENCOUNTER — Other Ambulatory Visit: Payer: Medicare HMO

## 2020-05-03 ENCOUNTER — Other Ambulatory Visit: Payer: Self-pay | Admitting: Nurse Practitioner

## 2020-05-03 MED ORDER — TOPIRAMATE 50 MG PO TABS: ORAL_TABLET | ORAL | 2 refills | Status: DC

## 2020-05-03 MED ORDER — CYANOCOBALAMIN 1000 MCG/ML IJ SOLN: 1000.0000 ug | INTRAMUSCULAR | 5 refills | Status: DC

## 2020-05-04 ENCOUNTER — Other Ambulatory Visit (HOSPITAL_COMMUNITY): Payer: Medicare HMO

## 2020-05-05 DIAGNOSIS — F429 Obsessive-compulsive disorder, unspecified: Secondary | ICD-10-CM | POA: Diagnosis not present

## 2020-05-06 ENCOUNTER — Other Ambulatory Visit: Payer: Self-pay | Admitting: Nurse Practitioner

## 2020-05-06 MED ORDER — BD TUBERCULIN SYRINGE 1 ML MISC: 0 refills | Status: AC

## 2020-05-11 DIAGNOSIS — F429 Obsessive-compulsive disorder, unspecified: Secondary | ICD-10-CM | POA: Diagnosis not present

## 2020-05-18 DIAGNOSIS — F429 Obsessive-compulsive disorder, unspecified: Secondary | ICD-10-CM | POA: Diagnosis not present

## 2020-05-18 DIAGNOSIS — F439 Reaction to severe stress, unspecified: Secondary | ICD-10-CM | POA: Diagnosis not present

## 2020-05-23 DIAGNOSIS — F429 Obsessive-compulsive disorder, unspecified: Secondary | ICD-10-CM | POA: Diagnosis not present

## 2020-05-23 DIAGNOSIS — F3132 Bipolar disorder, current episode depressed, moderate: Secondary | ICD-10-CM | POA: Diagnosis not present

## 2020-05-25 DIAGNOSIS — F32A Depression, unspecified: Secondary | ICD-10-CM | POA: Diagnosis not present

## 2020-05-25 DIAGNOSIS — F429 Obsessive-compulsive disorder, unspecified: Secondary | ICD-10-CM | POA: Diagnosis not present

## 2020-06-03 ENCOUNTER — Other Ambulatory Visit: Payer: Self-pay | Admitting: Nurse Practitioner

## 2020-06-03 MED ORDER — HYDROCOD POLST-CPM POLST ER 10-8 MG/5ML PO SUER: 5.0000 mL | Freq: Two times a day (BID) | ORAL | 0 refills | Status: DC | PRN

## 2020-06-03 MED ORDER — PREDNISONE 20 MG PO TABS: ORAL_TABLET | ORAL | 0 refills | Status: DC

## 2020-06-03 MED ORDER — AZITHROMYCIN 250 MG PO TABS
ORAL_TABLET | ORAL | 0 refills | Status: DC
Start: 2020-06-03 — End: 2020-06-13

## 2020-06-03 MED ORDER — ALBUTEROL SULFATE HFA 108 (90 BASE) MCG/ACT IN AERS: 2.0000 | INHALATION_SPRAY | RESPIRATORY_TRACT | 0 refills | Status: DC | PRN

## 2020-06-03 MED ORDER — ALBUTEROL SULFATE (2.5 MG/3ML) 0.083% IN NEBU: INHALATION_SOLUTION | RESPIRATORY_TRACT | 0 refills | Status: DC

## 2020-06-03 NOTE — Progress Notes (Signed)
Phone call from patient: S: Started last Sunday. C/o runny nose, severe congestion, head pressure. Cough got worse 2 days ago. Went to [facility] 2 days ago. Frequent cough. Robitussin OTC, Mucinex DM and Vicks severe head congestion with on relief.  No fever. Sore throat and ear pain. Can move neck. Wheezing with prolonged cough at night. No chest pain. No V/D. Having about 3-4 loose stools per day. Taking fluids well. Voiding nl. Albuterol inhaler and neb solution are expired.  O: Today's visit was via telephone Physical exam was not possible for this visit Obvious head congestion noted. No audible wheezing. Frequent congested cough noted.  A: Bacterial URI P: continue OTC meds as directed for symptoms. Warning signs reviewed.  Recommend office visit in 72 hours if no improvement, go to ED or urgent care sooner if worse.  Meds ordered this encounter  Medications  . azithromycin (ZITHROMAX Z-PAK) 250 MG tablet    Sig: Take 2 tablets (500 mg) on  Day 1,  followed by 1 tablet (250 mg) once daily on Days 2 through 5.    Dispense:  6 each    Refill:  0    Order Specific Question:   Supervising Provider    Answer:   Lilyan Punt A [[id number]]  . predniSONE (DELTASONE) 20 MG tablet    Sig: 3 po qd x 3 d then 2 po qd x 3 d then 1 po qd x 2 d    Dispense:  17 tablet    Refill:  0    Order Specific Question:   Supervising Provider    Answer:   Lilyan Punt A [[id number]]  . chlorpheniramine-HYDROcodone (TUSSIONEX PENNKINETIC ER) 10-8 MG/5ML SUER    Sig: Take 5 mLs by mouth every 12 (twelve) hours as needed for cough.    Dispense:  60 mL    Refill:  0    Order Specific Question:   Supervising Provider    Answer:   Lilyan Punt A [[id number]]  . albuterol (VENTOLIN HFA) 108 (90 Base) MCG/ACT inhaler    Sig: Inhale 2 puffs into the lungs every 4 (four) hours as needed for wheezing or shortness of breath.    Dispense:  8 g    Refill:  0    Order Specific Question:   Supervising Provider    Answer:   Lilyan Punt A [[id number]]  . albuterol (PROVENTIL) (2.5 MG/3ML) 0.083% nebulizer solution    Sig: Use via neb every 4 hours as needed for wheezing.    Dispense:  75 mL    Refill:  0    Order Specific Question:   Supervising Provider    Answer:   Lilyan Punt A [[id number]]

## 2020-06-03 NOTE — Progress Notes (Signed)
.  vir

## 2020-06-06 ENCOUNTER — Other Ambulatory Visit: Payer: Self-pay | Admitting: *Deleted

## 2020-06-08 NOTE — Telephone Encounter (Signed)
According to the pharmacy records, she got this filled on 06/05/20. This is mainly for night time cough. Even at twice a day, it should last 6 days. Not sure if a miscommunication. Thanks.

## 2020-06-13 ENCOUNTER — Encounter: Payer: Self-pay | Admitting: Family Medicine

## 2020-06-13 ENCOUNTER — Ambulatory Visit (INDEPENDENT_AMBULATORY_CARE_PROVIDER_SITE_OTHER): Payer: Medicare HMO | Admitting: Family Medicine

## 2020-06-13 VITALS — HR 73 | Temp 97.2°F | Resp 16

## 2020-06-13 DIAGNOSIS — R0981 Nasal congestion: Secondary | ICD-10-CM | POA: Diagnosis not present

## 2020-06-13 DIAGNOSIS — F429 Obsessive-compulsive disorder, unspecified: Secondary | ICD-10-CM | POA: Diagnosis not present

## 2020-06-13 DIAGNOSIS — B9689 Other specified bacterial agents as the cause of diseases classified elsewhere: Secondary | ICD-10-CM | POA: Diagnosis not present

## 2020-06-13 DIAGNOSIS — J019 Acute sinusitis, unspecified: Secondary | ICD-10-CM

## 2020-06-13 DIAGNOSIS — R059 Cough, unspecified: Secondary | ICD-10-CM | POA: Diagnosis not present

## 2020-06-13 MED ORDER — BENZONATATE 100 MG PO CAPS: 100.0000 mg | ORAL_CAPSULE | Freq: Two times a day (BID) | ORAL | 0 refills | Status: DC | PRN

## 2020-06-13 MED ORDER — FLUTICASONE PROPIONATE 50 MCG/ACT NA SUSP: 2.0000 | Freq: Every day | NASAL | 6 refills | Status: DC

## 2020-06-13 MED ORDER — AMOXICILLIN-POT CLAVULANATE 875-125 MG PO TABS: 1.0000 | ORAL_TABLET | Freq: Two times a day (BID) | ORAL | 0 refills | Status: DC
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
## 2020-06-15 DIAGNOSIS — F429 Obsessive-compulsive disorder, unspecified: Secondary | ICD-10-CM | POA: Diagnosis not present

## 2020-06-15 LAB — NOVEL CORONAVIRUS, NAA: SARS-CoV-2, NAA: NOT DETECTED

## 2020-06-15 LAB — SARS-COV-2, NAA 2 DAY TAT

## 2020-06-22 DIAGNOSIS — F429 Obsessive-compulsive disorder, unspecified: Secondary | ICD-10-CM | POA: Diagnosis not present

## 2020-06-22 DIAGNOSIS — F439 Reaction to severe stress, unspecified: Secondary | ICD-10-CM | POA: Diagnosis not present

## 2020-06-27 ENCOUNTER — Other Ambulatory Visit: Payer: Self-pay | Admitting: Family Medicine

## 2020-06-27 DIAGNOSIS — F422 Mixed obsessional thoughts and acts: Secondary | ICD-10-CM | POA: Diagnosis not present

## 2020-07-04 DIAGNOSIS — F422 Mixed obsessional thoughts and acts: Secondary | ICD-10-CM | POA: Diagnosis not present

## 2020-07-11 DIAGNOSIS — F422 Mixed obsessional thoughts and acts: Secondary | ICD-10-CM | POA: Diagnosis not present

## 2020-07-18 DIAGNOSIS — F4310 Post-traumatic stress disorder, unspecified: Secondary | ICD-10-CM | POA: Diagnosis not present

## 2020-07-18 DIAGNOSIS — F429 Obsessive-compulsive disorder, unspecified: Secondary | ICD-10-CM | POA: Diagnosis not present

## 2020-07-18 DIAGNOSIS — F3132 Bipolar disorder, current episode depressed, moderate: Secondary | ICD-10-CM | POA: Diagnosis not present

## 2020-08-05 ENCOUNTER — Ambulatory Visit (INDEPENDENT_AMBULATORY_CARE_PROVIDER_SITE_OTHER): Payer: Medicare HMO | Admitting: Nurse Practitioner

## 2020-08-05 DIAGNOSIS — J019 Acute sinusitis, unspecified: Secondary | ICD-10-CM

## 2020-08-05 DIAGNOSIS — B9689 Other specified bacterial agents as the cause of diseases classified elsewhere: Secondary | ICD-10-CM

## 2020-08-05 MED ORDER — AMOXICILLIN-POT CLAVULANATE 875-125 MG PO TABS: 1.0000 | ORAL_TABLET | Freq: Two times a day (BID) | ORAL | 0 refills | Status: DC

## 2020-08-05 NOTE — Progress Notes (Signed)
Subjective:    Patient ID: [name], female    DOB: 1980-11-14, 40 y.o.   MRN: [id number]  Sinus Problem This is a new problem. The current episode started in the past 7 days. Associated symptoms include congestion, coughing, headaches and sinus pressure.  Presents for complaints of sinus symptoms that began about a week ago.  Sore throat.  Facial pressure and headache.  Ear pain.  Cough worse at night mainly nonproductive, describes as a "tickle" in her throat.  Mucus does have yellowish color at times.  Has taken to Covid test both which were negative.  Has tried multiple OTC meds and naturals such as a Nettie pot with no relief of her symptoms.  States she has recurrent problems with her sinuses most recently treated 06/13/2020.    Review of Systems  HENT: Positive for congestion and sinus pressure.   Respiratory: Positive for cough.   Neurological: Positive for headaches.       Objective:   Physical Exam NAD.  Alert, oriented.  Left TM minimal clear effusion.  Right TM mildly retracted with clear fluid noted.  Pharynx posterior injected with greenish PND noted.  Neck supple with mild soft anterior adenopathy.  Lungs clear.  Heart regular rate and rhythm. Today's Vitals   08/05/20 1600  Pulse: 84  Temp: 98.4 F (36.9 C)  TempSrc: Temporal  SpO2: 100%  Weight: 149 lb 9.6 oz (67.9 kg)   Body mass index is 25.68 kg/m.        Assessment & Plan:  Acute bacterial rhinosinusitis - Plan: amoxicillin-clavulanate (AUGMENTIN) 875-125 MG tablet  Meds ordered this encounter  Medications  . amoxicillin-clavulanate (AUGMENTIN) 875-125 MG tablet    Sig: Take 1 tablet by mouth 2 (two) times daily.    Dispense:  20 tablet    Refill:  0    Allergy to cephalexin is nausea    Order Specific Question:   Supervising Provider    Answer:   Lilyan Punt A [[id number]]   Continue OTC meds as directed for symptoms. Strongly recommend referral to ENT specialist due to recurrent sinus  symptoms to avoid overuse of antibiotics. Return if symptoms worsen or fail to improve.

## 2020-08-06 ENCOUNTER — Encounter: Payer: Self-pay | Admitting: Nurse Practitioner

## 2020-08-08 ENCOUNTER — Other Ambulatory Visit: Payer: Self-pay | Admitting: Nurse Practitioner

## 2020-08-09 DIAGNOSIS — F439 Reaction to severe stress, unspecified: Secondary | ICD-10-CM | POA: Diagnosis not present

## 2020-08-09 DIAGNOSIS — F429 Obsessive-compulsive disorder, unspecified: Secondary | ICD-10-CM | POA: Diagnosis not present

## 2020-08-16 ENCOUNTER — Telehealth: Payer: Self-pay

## 2020-08-16 NOTE — Telephone Encounter (Signed)
Error

## 2020-08-22 ENCOUNTER — Other Ambulatory Visit: Payer: Self-pay

## 2020-08-22 ENCOUNTER — Ambulatory Visit (INDEPENDENT_AMBULATORY_CARE_PROVIDER_SITE_OTHER): Payer: Medicare HMO | Admitting: Family Medicine

## 2020-08-22 ENCOUNTER — Encounter: Payer: Self-pay | Admitting: Family Medicine

## 2020-08-22 VITALS — BP 129/88 | HR 87 | Temp 96.7°F | Wt 145.6 lb

## 2020-08-22 DIAGNOSIS — Z8614 Personal history of Methicillin resistant Staphylococcus aureus infection: Secondary | ICD-10-CM | POA: Diagnosis not present

## 2020-08-22 DIAGNOSIS — J340 Abscess, furuncle and carbuncle of nose: Secondary | ICD-10-CM

## 2020-08-22 MED ORDER — SULFAMETHOXAZOLE-TRIMETHOPRIM 800-160 MG PO TABS: 1.0000 | ORAL_TABLET | Freq: Two times a day (BID) | ORAL | 0 refills | Status: DC
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
## 2020-08-29 DIAGNOSIS — F3132 Bipolar disorder, current episode depressed, moderate: Secondary | ICD-10-CM | POA: Diagnosis not present

## 2020-08-29 DIAGNOSIS — F429 Obsessive-compulsive disorder, unspecified: Secondary | ICD-10-CM | POA: Diagnosis not present

## 2020-08-30 DIAGNOSIS — J310 Chronic rhinitis: Secondary | ICD-10-CM | POA: Diagnosis not present

## 2020-08-30 DIAGNOSIS — J0101 Acute recurrent maxillary sinusitis: Secondary | ICD-10-CM | POA: Diagnosis not present

## 2020-08-30 DIAGNOSIS — J343 Hypertrophy of nasal turbinates: Secondary | ICD-10-CM | POA: Diagnosis not present

## 2020-08-30 DIAGNOSIS — J342 Deviated nasal septum: Secondary | ICD-10-CM | POA: Diagnosis not present

## 2020-09-01 DIAGNOSIS — H52223 Regular astigmatism, bilateral: Secondary | ICD-10-CM | POA: Diagnosis not present

## 2020-09-01 DIAGNOSIS — Z0100 Encounter for examination of eyes and vision without abnormal findings: Secondary | ICD-10-CM | POA: Diagnosis not present

## 2020-09-02 ENCOUNTER — Ambulatory Visit (HOSPITAL_COMMUNITY)
Admission: RE | Admit: 2020-09-02 | Discharge: 2020-09-02 | Disposition: A | Discharge status: Home / Self Care | Payer: Medicare HMO | Source: Ambulatory Visit | Attending: Nurse Practitioner | Admitting: Nurse Practitioner

## 2020-09-02 ENCOUNTER — Other Ambulatory Visit: Payer: Self-pay

## 2020-09-02 DIAGNOSIS — E559 Vitamin D deficiency, unspecified: Secondary | ICD-10-CM | POA: Diagnosis not present

## 2020-09-02 DIAGNOSIS — K912 Postsurgical malabsorption, not elsewhere classified: Secondary | ICD-10-CM | POA: Insufficient documentation

## 2020-09-02 DIAGNOSIS — Z780 Asymptomatic menopausal state: Secondary | ICD-10-CM | POA: Diagnosis not present

## 2020-09-02 DIAGNOSIS — Z13820 Encounter for screening for osteoporosis: Secondary | ICD-10-CM | POA: Diagnosis not present

## 2020-09-20 DIAGNOSIS — J324 Chronic pansinusitis: Secondary | ICD-10-CM | POA: Diagnosis not present

## 2020-09-20 DIAGNOSIS — J343 Hypertrophy of nasal turbinates: Secondary | ICD-10-CM | POA: Diagnosis not present

## 2020-09-21 ENCOUNTER — Other Ambulatory Visit: Payer: Self-pay | Admitting: Otolaryngology

## 2020-09-21 DIAGNOSIS — J329 Chronic sinusitis, unspecified: Secondary | ICD-10-CM

## 2020-10-01 ENCOUNTER — Ambulatory Visit
Admission: RE | Admit: 2020-10-01 | Discharge: 2020-10-01 | Disposition: A | Discharge status: Home / Self Care | Payer: Medicare HMO | Source: Ambulatory Visit | Attending: Otolaryngology | Admitting: Otolaryngology

## 2020-10-01 ENCOUNTER — Other Ambulatory Visit: Payer: Self-pay

## 2020-10-01 DIAGNOSIS — J321 Chronic frontal sinusitis: Secondary | ICD-10-CM | POA: Diagnosis not present

## 2020-10-01 DIAGNOSIS — J320 Chronic maxillary sinusitis: Secondary | ICD-10-CM | POA: Diagnosis not present

## 2020-10-01 DIAGNOSIS — J329 Chronic sinusitis, unspecified: Secondary | ICD-10-CM

## 2020-10-01 DIAGNOSIS — J342 Deviated nasal septum: Secondary | ICD-10-CM | POA: Diagnosis not present

## 2020-10-01 DIAGNOSIS — J3489 Other specified disorders of nose and nasal sinuses: Secondary | ICD-10-CM | POA: Diagnosis not present

## 2020-10-14 ENCOUNTER — Other Ambulatory Visit: Payer: Self-pay | Admitting: Nurse Practitioner

## 2020-10-14 DIAGNOSIS — E538 Deficiency of other specified B group vitamins: Secondary | ICD-10-CM

## 2020-10-14 DIAGNOSIS — D539 Nutritional anemia, unspecified: Secondary | ICD-10-CM

## 2020-10-14 DIAGNOSIS — J320 Chronic maxillary sinusitis: Secondary | ICD-10-CM | POA: Diagnosis not present

## 2020-10-14 DIAGNOSIS — J343 Hypertrophy of nasal turbinates: Secondary | ICD-10-CM | POA: Diagnosis not present

## 2020-10-14 DIAGNOSIS — E785 Hyperlipidemia, unspecified: Secondary | ICD-10-CM

## 2020-10-14 DIAGNOSIS — K912 Postsurgical malabsorption, not elsewhere classified: Secondary | ICD-10-CM

## 2020-10-14 DIAGNOSIS — I10 Essential (primary) hypertension: Secondary | ICD-10-CM

## 2020-10-14 DIAGNOSIS — J321 Chronic frontal sinusitis: Secondary | ICD-10-CM | POA: Diagnosis not present

## 2020-10-14 DIAGNOSIS — K911 Postgastric surgery syndromes: Secondary | ICD-10-CM

## 2020-10-14 DIAGNOSIS — J342 Deviated nasal septum: Secondary | ICD-10-CM | POA: Diagnosis not present

## 2020-10-14 DIAGNOSIS — R5382 Chronic fatigue, unspecified: Secondary | ICD-10-CM

## 2020-10-14 DIAGNOSIS — K760 Fatty (change of) liver, not elsewhere classified: Secondary | ICD-10-CM

## 2020-10-17 DIAGNOSIS — F429 Obsessive-compulsive disorder, unspecified: Secondary | ICD-10-CM | POA: Diagnosis not present

## 2020-10-17 DIAGNOSIS — F4310 Post-traumatic stress disorder, unspecified: Secondary | ICD-10-CM | POA: Diagnosis not present

## 2020-10-18 DIAGNOSIS — R5382 Chronic fatigue, unspecified: Secondary | ICD-10-CM | POA: Diagnosis not present

## 2020-10-18 DIAGNOSIS — E785 Hyperlipidemia, unspecified: Secondary | ICD-10-CM | POA: Diagnosis not present

## 2020-10-18 DIAGNOSIS — E538 Deficiency of other specified B group vitamins: Secondary | ICD-10-CM | POA: Diagnosis not present

## 2020-10-18 DIAGNOSIS — D539 Nutritional anemia, unspecified: Secondary | ICD-10-CM | POA: Diagnosis not present

## 2020-10-18 DIAGNOSIS — K911 Postgastric surgery syndromes: Secondary | ICD-10-CM | POA: Diagnosis not present

## 2020-10-18 DIAGNOSIS — K912 Postsurgical malabsorption, not elsewhere classified: Secondary | ICD-10-CM | POA: Diagnosis not present

## 2020-10-18 DIAGNOSIS — I10 Essential (primary) hypertension: Secondary | ICD-10-CM | POA: Diagnosis not present

## 2020-10-18 DIAGNOSIS — K760 Fatty (change of) liver, not elsewhere classified: Secondary | ICD-10-CM | POA: Diagnosis not present

## 2020-10-19 LAB — CBC WITH DIFFERENTIAL/PLATELET
Basophils Absolute: 0.1 10*3/uL (ref 0.0–0.2)
Basos: 1 %
EOS (ABSOLUTE): 0.1 10*3/uL (ref 0.0–0.4)
Eos: 1 %
Hematocrit: 45.1 % (ref 34.0–46.6)
Hemoglobin: 15.1 g/dL (ref 11.1–15.9)
Immature Grans (Abs): 0 10*3/uL (ref 0.0–0.1)
Immature Granulocytes: 0 %
Lymphocytes Absolute: 2.5 10*3/uL (ref 0.7–3.1)
Lymphs: 31 %
MCH: 31.1 pg (ref 26.6–33.0)
MCHC: 33.5 g/dL (ref 31.5–35.7)
MCV: 93 fL (ref 79–97)
Monocytes Absolute: 0.3 10*3/uL (ref 0.1–0.9)
Monocytes: 4 %
Neutrophils Absolute: 5 10*3/uL (ref 1.4–7.0)
Neutrophils: 63 %
Platelets: 246 10*3/uL (ref 150–450)
RBC: 4.85 x10E6/uL (ref 3.77–5.28)
RDW: 12.3 % (ref 11.7–15.4)
WBC: 8 10*3/uL (ref 3.4–10.8)

## 2020-10-19 LAB — COMPREHENSIVE METABOLIC PANEL
ALT: 16 IU/L (ref 0–32)
AST: 19 IU/L (ref 0–40)
Albumin/Globulin Ratio: 2 (ref 1.2–2.2)
Albumin: 4.6 g/dL (ref 3.8–4.8)
Alkaline Phosphatase: 117 IU/L (ref 44–121)
BUN/Creatinine Ratio: 13 (ref 9–23)
BUN: 8 mg/dL (ref 6–20)
Bilirubin Total: 0.2 mg/dL (ref 0.0–1.2)
CO2: 17 mmol/L — ABNORMAL LOW (ref 20–29)
Calcium: 8.9 mg/dL (ref 8.7–10.2)
Chloride: 104 mmol/L (ref 96–106)
Creatinine, Ser: 0.64 mg/dL (ref 0.57–1.00)
Globulin, Total: 2.3 g/dL (ref 1.5–4.5)
Glucose: 162 mg/dL — ABNORMAL HIGH (ref 65–99)
Potassium: 4.4 mmol/L (ref 3.5–5.2)
Sodium: 139 mmol/L (ref 134–144)
Total Protein: 6.9 g/dL (ref 6.0–8.5)
eGFR: 115 mL/min/{1.73_m2} (ref >59)

## 2020-10-19 LAB — FERRITIN: Ferritin: 31 ng/mL (ref 15–150)

## 2020-10-19 LAB — MAGNESIUM: Magnesium: 1.8 mg/dL (ref 1.6–2.3)

## 2020-10-19 LAB — LIPID PANEL
Chol/HDL Ratio: 2.5 ratio (ref 0.0–4.4)
Cholesterol, Total: 211 mg/dL — ABNORMAL HIGH (ref 100–199)
HDL: 85 mg/dL (ref >39)
LDL Chol Calc (NIH): 99 mg/dL (ref 0–99)
Triglycerides: 162 mg/dL — ABNORMAL HIGH (ref 0–149)
VLDL Cholesterol Cal: 27 mg/dL (ref 5–40)

## 2020-10-19 LAB — TSH: TSH: 2.14 u[IU]/mL (ref 0.450–4.500)

## 2020-10-19 LAB — VITAMIN B12: Vitamin B-12: 273 pg/mL (ref 232–1245)

## 2020-10-21 ENCOUNTER — Other Ambulatory Visit: Payer: Self-pay

## 2020-10-21 ENCOUNTER — Other Ambulatory Visit: Payer: Self-pay | Admitting: Otolaryngology

## 2020-10-21 ENCOUNTER — Ambulatory Visit (INDEPENDENT_AMBULATORY_CARE_PROVIDER_SITE_OTHER): Payer: Medicare HMO | Admitting: Nurse Practitioner

## 2020-10-21 ENCOUNTER — Encounter: Payer: Self-pay | Admitting: Nurse Practitioner

## 2020-10-21 VITALS — BP 120/77 | HR 80 | Temp 97.3°F | Ht 64.0 in | Wt 148.0 lb

## 2020-10-21 DIAGNOSIS — G43009 Migraine without aura, not intractable, without status migrainosus: Secondary | ICD-10-CM | POA: Diagnosis not present

## 2020-10-21 DIAGNOSIS — I10 Essential (primary) hypertension: Secondary | ICD-10-CM

## 2020-10-21 DIAGNOSIS — E538 Deficiency of other specified B group vitamins: Secondary | ICD-10-CM | POA: Diagnosis not present

## 2020-10-21 MED ORDER — TOPIRAMATE 100 MG PO TABS: 100.0000 mg | ORAL_TABLET | Freq: Every day | ORAL | 2 refills | Status: DC

## 2020-10-21 NOTE — Patient Instructions (Signed)
Increase Topamax to 100 mg (take 2 per day of the 50 mg)

## 2020-10-21 NOTE — Progress Notes (Signed)
Subjective:    Patient ID: [name], female    DOB: 07/28/1980, 40 y.o.   MRN: [id number]  HPI  6 month follow up chronic medical conditions recent labs   Increased number of migraines lately which she relates to stress and sinus pressure. Is scheduled for sinus surgery in early June. No change in associated symptoms. Taking Topamax 50 mg qd. No relief with Excedrin Migraine or essential oils.  Her psychiatrist is weaning off the Klonopin and making changes to her medications.  Has Mirena for birth control. Has GYN this summer.  Plans to discuss permanent birth control. Recent labs were non fasting. She ate a piece of pound cake right before lab draw.  Had her eye exam in April.    Review of Systems  Respiratory: Negative for cough, chest tightness, shortness of breath and wheezing.   Cardiovascular: Negative for chest pain and leg swelling.   Depression screen [facility] 2/9 10/21/2020 02/21/2017  Decreased Interest 0 2  Down, Depressed, Hopeless 0 1  PHQ - 2 Score 0 3  Altered sleeping - 3  Tired, decreased energy - 3  Change in appetite - 3  Feeling bad or failure about yourself  - 0  Trouble concentrating - 3  Moving slowly or fidgety/restless - 0  Suicidal thoughts - 0  PHQ-9 Score - 15  Difficult doing work/chores - Somewhat difficult  Some recent data might be hidden        Objective:   Physical Exam NAD. Alert, oriented.  Making good eye contact.  Dressed appropriately.  Cheerful calm affect.  Lungs clear.  Heart regular rate rhythm. Today's Vitals   10/21/20 0850  BP: 120/77  Pulse: 80  Temp: (!) 97.3 F (36.3 C)  SpO2: 100%  Weight: 148 lb (67.1 kg)  Height: 5' 4" (1.626 m)   Body mass index is 25.4 kg/m.  Results for orders placed or performed in visit on 10/14/20  Comprehensive metabolic panel  Result Value Ref Range   Glucose 162 (H) 65 - 99 mg/dL   BUN 8 6 - 20 mg/dL   Creatinine, Ser 0.64 0.57 - 1.00 mg/dL   eGFR 115 >59 mL/min/1.73    BUN/Creatinine Ratio 13 9 - 23   Sodium 139 134 - 144 mmol/L   Potassium 4.4 3.5 - 5.2 mmol/L   Chloride 104 96 - 106 mmol/L   CO2 17 (L) 20 - 29 mmol/L   Calcium 8.9 8.7 - 10.2 mg/dL   Total Protein 6.9 6.0 - 8.5 g/dL   Albumin 4.6 3.8 - 4.8 g/dL   Globulin, Total 2.3 1.5 - 4.5 g/dL   Albumin/Globulin Ratio 2.0 1.2 - 2.2   Bilirubin Total 0.2 0.0 - 1.2 mg/dL   Alkaline Phosphatase 117 44 - 121 IU/L   AST 19 0 - 40 IU/L   ALT 16 0 - 32 IU/L  CBC with Differential/Platelet  Result Value Ref Range   WBC 8.0 3.4 - 10.8 x10E3/uL   RBC 4.85 3.77 - 5.28 x10E6/uL   Hemoglobin 15.1 11.1 - 15.9 g/dL   Hematocrit 45.1 34.0 - 46.6 %   MCV 93 79 - 97 fL   MCH 31.1 26.6 - 33.0 pg   MCHC 33.5 31.5 - 35.7 g/dL   RDW 12.3 11.7 - 15.4 %   Platelets 246 150 - 450 x10E3/uL   Neutrophils 63 Not Estab. %   Lymphs 31 Not Estab. %   Monocytes 4 Not Estab. %   Eos 1 Not  Subjective:    Patient ID: [name], female    DOB: 07/28/1980, 40 y.o.   MRN: [id number]  HPI  6 month follow up chronic medical conditions recent labs   Increased number of migraines lately which she relates to stress and sinus pressure. Is scheduled for sinus surgery in early June. No change in associated symptoms. Taking Topamax 50 mg qd. No relief with Excedrin Migraine or essential oils.  Her psychiatrist is weaning off the Klonopin and making changes to her medications.  Has Mirena for birth control. Has GYN this summer.  Plans to discuss permanent birth control. Recent labs were non fasting. She ate a piece of pound cake right before lab draw.  Had her eye exam in April.    Review of Systems  Respiratory: Negative for cough, chest tightness, shortness of breath and wheezing.   Cardiovascular: Negative for chest pain and leg swelling.   Depression screen [facility] 2/9 10/21/2020 02/21/2017  Decreased Interest 0 2  Down, Depressed, Hopeless 0 1  PHQ - 2 Score 0 3  Altered sleeping - 3  Tired, decreased energy - 3  Change in appetite - 3  Feeling bad or failure about yourself  - 0  Trouble concentrating - 3  Moving slowly or fidgety/restless - 0  Suicidal thoughts - 0  PHQ-9 Score - 15  Difficult doing work/chores - Somewhat difficult  Some recent data might be hidden        Objective:   Physical Exam NAD. Alert, oriented.  Making good eye contact.  Dressed appropriately.  Cheerful calm affect.  Lungs clear.  Heart regular rate rhythm. Today's Vitals   10/21/20 0850  BP: 120/77  Pulse: 80  Temp: (!) 97.3 F (36.3 C)  SpO2: 100%  Weight: 148 lb (67.1 kg)  Height: 5' 4" (1.626 m)   Body mass index is 25.4 kg/m.  Results for orders placed or performed in visit on 10/14/20  Comprehensive metabolic panel  Result Value Ref Range   Glucose 162 (H) 65 - 99 mg/dL   BUN 8 6 - 20 mg/dL   Creatinine, Ser 0.64 0.57 - 1.00 mg/dL   eGFR 115 >59 mL/min/1.73    BUN/Creatinine Ratio 13 9 - 23   Sodium 139 134 - 144 mmol/L   Potassium 4.4 3.5 - 5.2 mmol/L   Chloride 104 96 - 106 mmol/L   CO2 17 (L) 20 - 29 mmol/L   Calcium 8.9 8.7 - 10.2 mg/dL   Total Protein 6.9 6.0 - 8.5 g/dL   Albumin 4.6 3.8 - 4.8 g/dL   Globulin, Total 2.3 1.5 - 4.5 g/dL   Albumin/Globulin Ratio 2.0 1.2 - 2.2   Bilirubin Total 0.2 0.0 - 1.2 mg/dL   Alkaline Phosphatase 117 44 - 121 IU/L   AST 19 0 - 40 IU/L   ALT 16 0 - 32 IU/L  CBC with Differential/Platelet  Result Value Ref Range   WBC 8.0 3.4 - 10.8 x10E3/uL   RBC 4.85 3.77 - 5.28 x10E6/uL   Hemoglobin 15.1 11.1 - 15.9 g/dL   Hematocrit 45.1 34.0 - 46.6 %   MCV 93 79 - 97 fL   MCH 31.1 26.6 - 33.0 pg   MCHC 33.5 31.5 - 35.7 g/dL   RDW 12.3 11.7 - 15.4 %   Platelets 246 150 - 450 x10E3/uL   Neutrophils 63 Not Estab. %   Lymphs 31 Not Estab. %   Monocytes 4 Not Estab. %   Eos 1 Not

## 2020-10-22 ENCOUNTER — Encounter: Payer: Self-pay | Admitting: Nurse Practitioner

## 2020-10-22 ENCOUNTER — Other Ambulatory Visit: Payer: Self-pay | Admitting: Nurse Practitioner

## 2020-10-22 MED ORDER — UBRELVY 50 MG PO TABS: ORAL_TABLET | ORAL | 0 refills | Status: DC

## 2020-10-22 NOTE — Progress Notes (Signed)
Patent requests an acute therapy for her migraines. See note 10/21/20

## 2020-10-25 ENCOUNTER — Telehealth: Payer: Self-pay | Admitting: *Deleted

## 2020-10-25 DIAGNOSIS — Z3009 Encounter for other general counseling and advice on contraception: Secondary | ICD-10-CM | POA: Diagnosis not present

## 2020-10-25 DIAGNOSIS — Z124 Encounter for screening for malignant neoplasm of cervix: Secondary | ICD-10-CM | POA: Diagnosis not present

## 2020-10-25 NOTE — Telephone Encounter (Signed)
Fax from [facility]. ubrelvy 50mg  tablet was denied. Drug is non - formular. Must try and fail naratripatn or rizatriptan. Please see form on carolyn's desk.

## 2020-11-01 ENCOUNTER — Other Ambulatory Visit: Payer: Self-pay

## 2020-11-01 ENCOUNTER — Encounter (HOSPITAL_BASED_OUTPATIENT_CLINIC_OR_DEPARTMENT_OTHER): Payer: Self-pay | Admitting: Otolaryngology

## 2020-11-08 ENCOUNTER — Ambulatory Visit (HOSPITAL_BASED_OUTPATIENT_CLINIC_OR_DEPARTMENT_OTHER)
Admission: RE | Admit: 2020-11-08 | Discharge: 2020-11-08 | Disposition: A | Discharge status: Home / Self Care | Payer: Medicare HMO | Source: Ambulatory Visit | Attending: Otolaryngology | Admitting: Otolaryngology

## 2020-11-08 ENCOUNTER — Ambulatory Visit (HOSPITAL_BASED_OUTPATIENT_CLINIC_OR_DEPARTMENT_OTHER): Payer: Medicare HMO | Admitting: Anesthesiology

## 2020-11-08 ENCOUNTER — Encounter (HOSPITAL_BASED_OUTPATIENT_CLINIC_OR_DEPARTMENT_OTHER)
Admission: RE | Disposition: A | Discharge status: Home / Self Care | Payer: Self-pay | Source: Ambulatory Visit | Attending: Otolaryngology

## 2020-11-08 ENCOUNTER — Other Ambulatory Visit: Payer: Self-pay

## 2020-11-08 ENCOUNTER — Encounter (HOSPITAL_BASED_OUTPATIENT_CLINIC_OR_DEPARTMENT_OTHER): Payer: Self-pay | Admitting: Otolaryngology

## 2020-11-08 DIAGNOSIS — J343 Hypertrophy of nasal turbinates: Secondary | ICD-10-CM | POA: Diagnosis not present

## 2020-11-08 DIAGNOSIS — J321 Chronic frontal sinusitis: Secondary | ICD-10-CM | POA: Insufficient documentation

## 2020-11-08 DIAGNOSIS — K219 Gastro-esophageal reflux disease without esophagitis: Secondary | ICD-10-CM | POA: Diagnosis not present

## 2020-11-08 DIAGNOSIS — J320 Chronic maxillary sinusitis: Secondary | ICD-10-CM | POA: Insufficient documentation

## 2020-11-08 DIAGNOSIS — J329 Chronic sinusitis, unspecified: Secondary | ICD-10-CM | POA: Diagnosis not present

## 2020-11-08 DIAGNOSIS — J342 Deviated nasal septum: Secondary | ICD-10-CM | POA: Insufficient documentation

## 2020-11-08 DIAGNOSIS — J3489 Other specified disorders of nose and nasal sinuses: Secondary | ICD-10-CM | POA: Diagnosis not present

## 2020-11-08 DIAGNOSIS — J338 Other polyp of sinus: Secondary | ICD-10-CM | POA: Diagnosis not present

## 2020-11-08 DIAGNOSIS — E559 Vitamin D deficiency, unspecified: Secondary | ICD-10-CM | POA: Diagnosis not present

## 2020-11-08 HISTORY — DX: Other specified postprocedural states: Z98.890

## 2020-11-08 HISTORY — PX: SINUS ENDO WITH FUSION: SHX5329

## 2020-11-08 HISTORY — PX: FRONTAL SINUS EXPLORATION: SHX6591

## 2020-11-08 HISTORY — PX: MAXILLARY ANTROSTOMY: SHX2003

## 2020-11-08 HISTORY — DX: Other complications of anesthesia, initial encounter: T88.59XA

## 2020-11-08 HISTORY — DX: Nausea with vomiting, unspecified: R11.2

## 2020-11-08 HISTORY — PX: NASAL SEPTOPLASTY W/ TURBINOPLASTY: SHX2070

## 2020-11-08 LAB — POCT PREGNANCY, URINE: Preg Test, Ur: NEGATIVE

## 2020-11-08 SURGERY — SEPTOPLASTY, NOSE, WITH NASAL TURBINATE REDUCTION
Anesthesia: General | Site: Nose | Laterality: Right

## 2020-11-08 MED ORDER — OXYCODONE-ACETAMINOPHEN 5-325 MG PO TABS: 1.0000 | ORAL_TABLET | ORAL | 0 refills | Status: AC | PRN

## 2020-11-08 MED ORDER — ONDANSETRON HCL 4 MG/2ML IJ SOLN
INTRAMUSCULAR | Status: AC
  Filled 2020-11-08: qty 2

## 2020-11-08 MED ORDER — FENTANYL CITRATE (PF) 100 MCG/2ML IJ SOLN
INTRAMUSCULAR | Status: AC
  Filled 2020-11-08: qty 2

## 2020-11-08 MED ORDER — LACTATED RINGERS IV SOLN: INTRAVENOUS | Status: DC

## 2020-11-08 MED ORDER — OXYCODONE HCL 5 MG PO TABS
5.0000 mg | ORAL_TABLET | Freq: Once | ORAL | Status: AC
  Administered 2020-11-08: 5 mg via ORAL

## 2020-11-08 MED ORDER — ROCURONIUM BROMIDE 10 MG/ML (PF) SYRINGE
PREFILLED_SYRINGE | INTRAVENOUS | Status: AC
  Filled 2020-11-08: qty 10

## 2020-11-08 MED ORDER — MUPIROCIN 2 % EX OINT
TOPICAL_OINTMENT | CUTANEOUS | Status: DC | PRN
  Administered 2020-11-08: 1 via TOPICAL

## 2020-11-08 MED ORDER — LIDOCAINE HCL (PF) 2 % IJ SOLN
INTRAMUSCULAR | Status: AC
  Filled 2020-11-08: qty 5

## 2020-11-08 MED ORDER — OXYCODONE HCL 5 MG PO TABS
ORAL_TABLET | ORAL | Status: AC
  Filled 2020-11-08: qty 1

## 2020-11-08 MED ORDER — LIDOCAINE HCL (CARDIAC) PF 100 MG/5ML IV SOSY
PREFILLED_SYRINGE | INTRAVENOUS | Status: DC | PRN
  Administered 2020-11-08: 40 mg via INTRAVENOUS

## 2020-11-08 MED ORDER — CLINDAMYCIN PHOSPHATE 600 MG/50ML IV SOLN
INTRAVENOUS | Status: DC | PRN
  Administered 2020-11-08: 600 mg via INTRAVENOUS

## 2020-11-08 MED ORDER — FENTANYL CITRATE (PF) 100 MCG/2ML IJ SOLN
INTRAMUSCULAR | Status: DC | PRN
  Administered 2020-11-08: 100 ug via INTRAVENOUS

## 2020-11-08 MED ORDER — SCOPOLAMINE 1 MG/3DAYS TD PT72
1.0000 | MEDICATED_PATCH | TRANSDERMAL | Status: DC
  Administered 2020-11-08: 1.5 mg via TRANSDERMAL

## 2020-11-08 MED ORDER — ROCURONIUM BROMIDE 10 MG/ML (PF) SYRINGE
PREFILLED_SYRINGE | INTRAVENOUS | Status: DC | PRN
  Administered 2020-11-08: 30 mg via INTRAVENOUS
  Administered 2020-11-08: 50 mg via INTRAVENOUS

## 2020-11-08 MED ORDER — MIDAZOLAM HCL 2 MG/2ML IJ SOLN
INTRAMUSCULAR | Status: AC
  Filled 2020-11-08: qty 2

## 2020-11-08 MED ORDER — ONDANSETRON HCL 4 MG/2ML IJ SOLN
INTRAMUSCULAR | Status: DC | PRN
  Administered 2020-11-08: 4 mg via INTRAVENOUS

## 2020-11-08 MED ORDER — SUGAMMADEX SODIUM 200 MG/2ML IV SOLN
INTRAVENOUS | Status: DC | PRN
  Administered 2020-11-08: 200 mg via INTRAVENOUS

## 2020-11-08 MED ORDER — LIDOCAINE-EPINEPHRINE 1 %-1:100000 IJ SOLN
INTRAMUSCULAR | Status: DC | PRN
  Administered 2020-11-08: 4 mL

## 2020-11-08 MED ORDER — MIDAZOLAM HCL 5 MG/5ML IJ SOLN
INTRAMUSCULAR | Status: DC | PRN
  Administered 2020-11-08: 2 mg via INTRAVENOUS

## 2020-11-08 MED ORDER — DEXMEDETOMIDINE (PRECEDEX) IN NS 20 MCG/5ML (4 MCG/ML) IV SYRINGE
PREFILLED_SYRINGE | INTRAVENOUS | Status: DC | PRN
  Administered 2020-11-08: 8 ug via INTRAVENOUS
  Administered 2020-11-08: 12 ug via INTRAVENOUS

## 2020-11-08 MED ORDER — OXYMETAZOLINE HCL 0.05 % NA SOLN
NASAL | Status: DC | PRN
  Administered 2020-11-08: 1 via TOPICAL

## 2020-11-08 MED ORDER — DEXAMETHASONE SODIUM PHOSPHATE 4 MG/ML IJ SOLN
INTRAMUSCULAR | Status: DC | PRN
  Administered 2020-11-08: 10 mg via INTRAVENOUS

## 2020-11-08 MED ORDER — SCOPOLAMINE 1 MG/3DAYS TD PT72
MEDICATED_PATCH | TRANSDERMAL | Status: AC
  Filled 2020-11-08: qty 1

## 2020-11-08 MED ORDER — PROPOFOL 10 MG/ML IV BOLUS
INTRAVENOUS | Status: DC | PRN
  Administered 2020-11-08: 140 mg via INTRAVENOUS

## 2020-11-08 MED ORDER — CLINDAMYCIN PHOSPHATE 600 MG/50ML IV SOLN
INTRAVENOUS | Status: AC
  Filled 2020-11-08: qty 50

## 2020-11-08 MED ORDER — CLINDAMYCIN HCL 300 MG PO CAPS: 300.0000 mg | ORAL_CAPSULE | Freq: Three times a day (TID) | ORAL | 0 refills | Status: AC

## 2020-11-08 MED ORDER — PROMETHAZINE HCL 25 MG/ML IJ SOLN
6.2500 mg | INTRAMUSCULAR | Status: DC | PRN
Start: 2020-11-08 — End: 2020-11-08

## 2020-11-08 MED ORDER — ACETAMINOPHEN 10 MG/ML IV SOLN: 1000.0000 mg | Freq: Once | INTRAVENOUS | Status: DC | PRN

## 2020-11-08 MED ORDER — FENTANYL CITRATE (PF) 100 MCG/2ML IJ SOLN
25.0000 ug | INTRAMUSCULAR | Status: DC | PRN
  Administered 2020-11-08 (×2): 50 ug via INTRAVENOUS

## 2020-11-08 MED ORDER — DEXAMETHASONE SODIUM PHOSPHATE 10 MG/ML IJ SOLN
INTRAMUSCULAR | Status: AC
  Filled 2020-11-08: qty 1

## 2020-11-08 SURGICAL SUPPLY — 58 items
ATTRACTOMAT 16X20 MAGNETIC DRP (DRAPES) IMPLANT
BLADE RAD40 ROTATE 4M 4 5PK (BLADE) IMPLANT
BLADE RAD60 ROTATE M4 4 5PK (BLADE) IMPLANT
BLADE ROTATE RAD 12 4 M4 (BLADE) IMPLANT
BLADE ROTATE RAD 40 4 M4 (BLADE) IMPLANT
BLADE ROTATE TRICUT 4X13 M4 (BLADE) ×3 IMPLANT
BLADE TRICUT ROTATE M4 4 5PK (BLADE) IMPLANT
BUR HS RAD FRONTAL 3 (BURR) IMPLANT
CANISTER SUC SOCK COL 7IN (MISCELLANEOUS) ×3 IMPLANT
CANISTER SUCT 1200ML W/VALVE (MISCELLANEOUS) ×6 IMPLANT
COAGULATOR SUCT 8FR VV (MISCELLANEOUS) ×3 IMPLANT
COVER WAND RF STERILE (DRAPES) IMPLANT
DECANTER SPIKE VIAL GLASS SM (MISCELLANEOUS) IMPLANT
DRSG NASAL KENNEDY LMNT 8CM (GAUZE/BANDAGES/DRESSINGS) IMPLANT
DRSG NASOPORE 8CM (GAUZE/BANDAGES/DRESSINGS) ×1 IMPLANT
DRSG TELFA 3X8 NADH (GAUZE/BANDAGES/DRESSINGS) IMPLANT
ELECT REM PT RETURN 9FT ADLT (ELECTROSURGICAL) ×3
ELECTRODE REM PT RTRN 9FT ADLT (ELECTROSURGICAL) ×2 IMPLANT
GLOVE SURG POLYISO LF SZ6.5 (GLOVE) ×1 IMPLANT
GLOVE SURG POLYISO LF SZ8 (GLOVE) ×1 IMPLANT
GLOVE SURG SYN 7.5  E (GLOVE) ×3
GLOVE SURG SYN 7.5 E (GLOVE) ×2 IMPLANT
GLOVE SURG SYN 7.5 PF PI (GLOVE) ×1 IMPLANT
GLOVE SURG UNDER POLY LF SZ7 (GLOVE) ×1 IMPLANT
GOWN STRL REUS W/ TWL LRG LVL3 (GOWN DISPOSABLE) ×4 IMPLANT
GOWN STRL REUS W/TWL LRG LVL3 (GOWN DISPOSABLE) ×6
HEMOSTAT SURGICEL 2X14 (HEMOSTASIS) IMPLANT
IV NS 500ML (IV SOLUTION) ×3
IV NS 500ML BAXH (IV SOLUTION) ×2 IMPLANT
NDL HYPO 25X1 1.5 SAFETY (NEEDLE) ×1 IMPLANT
NDL SPNL 25GX3.5 QUINCKE BL (NEEDLE) IMPLANT
NEEDLE HYPO 25X1 1.5 SAFETY (NEEDLE) ×3 IMPLANT
NEEDLE SPNL 25GX3.5 QUINCKE BL (NEEDLE) IMPLANT
NS IRRIG 1000ML POUR BTL (IV SOLUTION) ×3 IMPLANT
PACK BASIN DAY SURGERY FS (CUSTOM PROCEDURE TRAY) ×3 IMPLANT
PACK ENT DAY SURGERY (CUSTOM PROCEDURE TRAY) ×3 IMPLANT
PAD DRESSING TELFA 3X8 NADH (GAUZE/BANDAGES/DRESSINGS) IMPLANT
SLEEVE SCD COMPRESS KNEE MED (STOCKING) ×3 IMPLANT
SOLUTION BUTLER CLEAR DIP (MISCELLANEOUS) ×3 IMPLANT
SPLINT NASAL AIRWAY SILICONE (MISCELLANEOUS) ×3 IMPLANT
SPONGE GAUZE 2X2 8PLY STRL LF (GAUZE/BANDAGES/DRESSINGS) ×3 IMPLANT
SPONGE NEURO XRAY DETECT 1X3 (DISPOSABLE) ×3 IMPLANT
SUCTION FRAZIER HANDLE 10FR (MISCELLANEOUS)
SUCTION TUBE FRAZIER 10FR DISP (MISCELLANEOUS) IMPLANT
SUT CHROMIC 4 0 P 3 18 (SUTURE) ×3 IMPLANT
SUT PLAIN 4 0 ~~LOC~~ 1 (SUTURE) ×3 IMPLANT
SUT PROLENE 3 0 PS 2 (SUTURE) ×3 IMPLANT
SUT VIC AB 4-0 P-3 18XBRD (SUTURE) IMPLANT
SUT VIC AB 4-0 P3 18 (SUTURE)
SYR 50ML LL SCALE MARK (SYRINGE) ×1 IMPLANT
TOWEL GREEN STERILE FF (TOWEL DISPOSABLE) ×6 IMPLANT
TRACKER ENT INSTRUMENT (MISCELLANEOUS) ×3 IMPLANT
TRACKER ENT PATIENT (MISCELLANEOUS) ×3 IMPLANT
TUBE CONNECTING 20X1/4 (TUBING) ×3 IMPLANT
TUBE SALEM SUMP 12R W/ARV (TUBING) IMPLANT
TUBE SALEM SUMP 16 FR W/ARV (TUBING) ×3 IMPLANT
TUBING STRAIGHTSHOT EPS 5PK (TUBING) ×3 IMPLANT
YANKAUER SUCT BULB TIP NO VENT (SUCTIONS) ×3 IMPLANT

## 2020-11-08 NOTE — Anesthesia Preprocedure Evaluation (Signed)
Anesthesia Evaluation  Patient identified by MRN, date of birth, ID band Patient awake    Reviewed: Allergy & Precautions, NPO status , Patient's Chart, lab work & pertinent test results  History of Anesthesia Complications (+) PONV  Airway Mallampati: II  TM Distance: >3 FB Neck ROM: Full    Dental  (+) Teeth Intact   Pulmonary neg pulmonary ROS,    Pulmonary exam normal        Cardiovascular hypertension,  Rhythm:Regular Rate:Normal     Neuro/Psych  Headaches, Anxiety Depression    GI/Hepatic Neg liver ROS, GERD  ,  Endo/Other    Renal/GU negative Renal ROS  negative genitourinary   Musculoskeletal  (+) Arthritis , Osteoarthritis,    Abdominal (+)  Abdomen: soft. Bowel sounds: normal.  Peds  Hematology  (+) anemia ,   Anesthesia Other Findings Septal deviation with chronic sinusitis   Reproductive/Obstetrics                             Anesthesia Physical Anesthesia Plan  ASA: II  Anesthesia Plan: General   Post-op Pain Management:    Induction: Intravenous  PONV Risk Score and Plan: 4 or greater and Ondansetron, Dexamethasone, Midazolam and Scopolamine patch - Pre-op  Airway Management Planned: Mask and Oral ETT  Additional Equipment: None  Intra-op Plan:   Post-operative Plan: Extubation in OR  Informed Consent: I have reviewed the patients History and Physical, chart, labs and discussed the procedure including the risks, benefits and alternatives for the proposed anesthesia with the patient or authorized representative who has indicated his/her understanding and acceptance.     Dental advisory given  Plan Discussed with: CRNA  Anesthesia Plan Comments: (Lab Results      Component                Value               Date                      WBC                      8.0                 10/18/2020                HGB                      15.1                 10/18/2020                HCT                      45.1                10/18/2020                MCV                      93                  10/18/2020                PLT  246                 10/18/2020           Lab Results      Component                Value               Date                      NA                       139                 10/18/2020                K                        4.4                 10/18/2020                CO2                      17 (L)              10/18/2020                GLUCOSE                  162 (H)             10/18/2020                BUN                      8                   10/18/2020                CREATININE               0.64                10/18/2020                CALCIUM                  8.9                 10/18/2020                GFRNONAA                 >60                 04/13/2018                GFRAA                    >60                 04/13/2018          )        Anesthesia Quick Evaluation

## 2020-11-08 NOTE — H&P (Signed)
Cc: Chronic rhinosinusitis, chronic headaches  HPI: The patient is a 40 year old female who returns today for her follow-up evaluation. The patient has a history of chronic rhinosinusitis and chronic headaches.  She recently underwent a sinus CT scan.  The CT showed mucosal thickening, occluding the right frontal sinus ostium and the drainage pathway. The right maxillary opening and right ostiomeatal unit was also severely narrowed due to mucosal edema.  The patient returns today complaining of persistent headaches and nasal drainage.  She was previously treated with multiple courses of antibiotics.  Currently she is taking Excedrin and Topamax to treat her possible migraine headaches.  In addition, the patient also complains of chronic nasal obstruction.  She is currently a habitual mouth breather.    Exam: General: Communicates without difficulty, well nourished, no acute distress. Head: Normocephalic, no evidence injury, no tenderness, facial buttresses intact without stepoff. Eyes: PERRL, EOMI. No scleral icterus, conjunctivae clear. Neuro: CN II exam reveals vision grossly intact.  No nystagmus at any point of gaze. Ears: Auricles well formed without lesions.  Ear canals are intact without mass or lesion.  No erythema or edema is appreciated.  The TMs are intact without fluid. Nose: External evaluation reveals normal support and skin without lesions.  Dorsum is intact. Mild anterior crusting and tenderness noted.  Anterior rhinoscopy reveals congested and edematous mucosa over anterior aspect of the inferior turbinates and nasal septum.  No purulence is noted. Middle meatus is not well visualized. Oral:  Oral cavity and oropharynx are intact, symmetric, without erythema or edema.  Mucosa is moist without lesions. Neck: Full range of motion without pain.  There is no significant lymphadenopathy.  No masses palpable.  Thyroid bed within normal limits to palpation.  Parotid glands and submandibular glands  equal bilaterally without mass.  Trachea is midline. Neuro:  CN 2-12 grossly intact. Gait normal. Vestibular: No nystagmus at any point of gaze. A flexible scope was inserted into the right nasal cavity.  Endoscopy of the interior nasal cavity, superior, inferior, and middle meatus was performed. The sphenoid-ethmoid recess was examined. Edematous mucosa was noted.  No polyp, mass, or lesion was appreciated. Nasal septal deviation and spur noted.  Olfactory cleft was clear.  Nasopharynx was clear.  Turbinates were hypertrophied but without mass. The procedure was repeated on the contralateral side with similar findings.  The patient tolerated the procedure well.  Assessment: 1.  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  This has resulted in chronic headaches and facial pain.  2.  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  The patient has a large right bony septal spur.  More than 95% of her nasal passageways are obstructed bilaterally.   Plan: 1.  The nasal endoscopy findings and the CT images are reviewed with the patient.  2.  Continue with Flonase nasal spray daily.  The patient was previously treated with multiple courses of antibiotics and systemic steroids.  3.  In light of her persistent symptoms, she may benefit from surgical intervention with septoplasty, turbinate reduction, and endoscopic sinus surgery that addresses her right frontal and maxillary sinuses.  The risks, benefits, alternatives and details of the procedure are reviewed with the patient.   4.  The patient would like to proceed with the procedures.

## 2020-11-08 NOTE — Anesthesia Postprocedure Evaluation (Signed)
Anesthesia Post Note  Patient: [name]  Procedure(s) Performed: NASAL SEPTOPLASTY WITH TURBINATE REDUCTION (Bilateral Nose) RIGHT MAXILLARY ANTROSTOMY (Right Nose) RIGHT FRONTAL SINUS EXPLORATION (Right Nose) SINUS ENDOSCOPY WITH FUSION NAVIGATION (Right Nose)     Patient location during evaluation: PACU Anesthesia Type: General Level of consciousness: awake and alert Pain management: pain level controlled Vital Signs Assessment: post-procedure vital signs reviewed and stable Respiratory status: spontaneous breathing, nonlabored ventilation, respiratory function stable and patient connected to nasal cannula oxygen Cardiovascular status: blood pressure returned to baseline and stable Postop Assessment: no apparent nausea or vomiting Anesthetic complications: no   No complications documented.  Last Vitals:  Vitals:   11/08/20 1130 11/08/20 1145  BP: 122/87 (!) 138/98  Pulse: 73 76  Resp: 12 12  Temp:  37.1 C  SpO2: 94%     Last Pain:  Vitals:   11/08/20 1227  TempSrc:   PainSc: 0-No pain                 Belenda Cruise P Cleon Thoma

## 2020-11-08 NOTE — Op Note (Signed)
DATE OF PROCEDURE: 11/08/2020  OPERATIVE REPORT   SURGEON: Leta Baptist, MD   PREOPERATIVE DIAGNOSES:  1. Severe nasal septal deviation.  2. Bilateral inferior turbinate hypertrophy.  3. Chronic nasal obstruction. 4. Chronic right maxillary and frontal sinusitis.  POSTOPERATIVE DIAGNOSES:  1. Severe nasal septal deviation.  2. Bilateral inferior turbinate hypertrophy.  3. Chronic nasal obstruction. 4. Chronic right maxillary and frontal sinusitis.  PROCEDURE PERFORMED:  1. Septoplasty.  2. Bilateral partial inferior turbinate resection.  3. Right endoscopic frontal sinusotomy. 4. Right endoscopic maxillary antrostomy. 5. FUSION stereostatic image guidance.  ANESTHESIA: General endotracheal tube anesthesia.   COMPLICATIONS: None.   ESTIMATED BLOOD LOSS: 100 mL.   INDICATION FOR PROCEDURE: [name] is a 40 y.o. female with a history of chronic nasal obstruction, chronic rhinosinusitis and chronic headaches. She was previously treated with multiple courses of antibiotics, antihistamine, decongestant, and steroid nasal sprays. However, the patient continued to be symptomatic.  She recently underwent a sinus CT scan.  The CT showed mucosal thickening, occluding the right frontal sinus ostium and the drainage pathway. The right maxillary opening and right ostiomeatal unit was also severely narrowed due to mucosal edema.  The patient returns today complaining of persistent headaches and nasal drainage.   On examination, the patient was noted to have bilateral severe inferior turbinate hypertrophy and significant nasal septal deviation, causing significant nasal obstruction. Based on the above findings, the decision was made for the patient to undergo the above-stated procedures. The risks, benefits, alternatives, and details of the procedures were discussed with the patient. Questions were invited and answered. Informed consent was obtained.   DESCRIPTION OF PROCEDURE: The patient was  taken to the operating room and placed supine on the operating table. General endotracheal tube anesthesia was administered by the anesthesiologist. The patient was positioned, and prepped and draped in the standard fashion for nasal surgery. Pledgets soaked with Afrin were placed in both nasal cavities for decongestion. The pledgets were subsequently removed. The FUSION stereotactic image guidance marker was placed. The image guidance system was functional throughout the case.  Examination of the nasal cavity revealed a severe nasal septal deviation. 1% lidocaine with 1:100,000 epinephrine was injected onto the nasal septum bilaterally. A hemitransfixion incision was made on the left side. The mucosal flap was carefully elevated on the left side. A cartilaginous incision was made 1 cm superior to the caudal margin of the nasal septum. Mucosal flap was also elevated on the right side in the similar fashion. It should be noted that due to the severe septal deviation, the deviated portion of the cartilaginous and bony septum had to be removed in piecemeal fashion. Once the deviated portions were removed, a straight midline septum was achieved. The septum was then quilted with 4-0 plain gut sutures. The hemitransfixion incision was closed with interrupted 4-0 chromic sutures.   The inferior one half of both hypertrophied inferior turbinate was crossclamped with a Kelly clamp. The inferior one half of each inferior turbinate was then resected with a pair of cross cutting scissors. Hemostasis was achieved with a suction cautery device.   Using a 0 endoscope, the right nasal cavity was examined.  The uncinate process was resected with a freer elevator. The maxillary antrum was entered and enlarged using a combination of backbiter and microdebrider. Polypoid tissue was removed from the right maxillary antrum. Attention was then focused on the frontal sinus. The frontal recess was identified and enlarged by removing  the surrounding bony partitions. Mucoid drainage was  removed from the frontal recess. Doyle splints were applied to the nasal septum.  The care of the patient was turned over to the anesthesiologist. The patient was awakened from anesthesia without difficulty. The patient was extubated and transferred to the recovery room in good condition.   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 Chronic right maxillary and frontal sinusitis.  SPECIMEN: None.   FOLLOWUP CARE: The patient be discharged home once she is awake and alert. The patient will be placed on Percocet p.r.n. pain, and clindamycin for 3 days. The patient will follow up in my office in 2 days for splint removal.   Erica Rabold Raynelle Bring, MD

## 2020-11-08 NOTE — Transfer of Care (Signed)
Immediate Anesthesia Transfer of Care Note  Patient: [name]  Procedure(s) Performed: NASAL SEPTOPLASTY WITH TURBINATE REDUCTION (Bilateral Nose) RIGHT MAXILLARY ANTROSTOMY (Right Nose) RIGHT FRONTAL SINUS EXPLORATION (Right Nose) SINUS ENDOSCOPY WITH FUSION NAVIGATION (Right Nose)  Patient Location: PACU  Anesthesia Type:General  Level of Consciousness: awake and alert   Airway & Oxygen Therapy: Patient Spontanous Breathing and Patient connected to face mask oxygen  Post-op Assessment: Report given to RN and Post -op Vital signs reviewed and stable  Post vital signs: Reviewed and stable  Last Vitals:  Vitals Value Taken Time  BP 137/107 11/08/20 1036  Temp    Pulse 93 11/08/20 1036  Resp 16 11/08/20 1036  SpO2 94 % 11/08/20 1036  Vitals shown include unvalidated device data.  Last Pain:  Vitals:   11/08/20 0743  TempSrc: Oral  PainSc: 5          Complications: No complications documented.

## 2020-11-08 NOTE — Discharge Instructions (Signed)
Post Anesthesia Home Care Instructions  Activity: Get plenty of rest for the remainder of the day. A responsible individual must stay with you for 24 hours following the procedure.  For the next 24 hours, DO NOT: -Drive a car -Operate machinery -Drink alcoholic beverages -Take any medication unless instructed by your physician -Make any legal decisions or sign important papers.  Meals: Start with liquid foods such as gelatin or soup. Progress to regular foods as tolerated. Avoid greasy, spicy, heavy foods. If nausea and/or vomiting occur, drink only clear liquids until the nausea and/or vomiting subsides. Call your physician if vomiting continues.  Special Instructions/Symptoms: Your throat may feel dry or sore from the anesthesia or the breathing tube placed in your throat during surgery. If this causes discomfort, gargle with warm salt water. The discomfort should disappear within 24 hours.  If you had a scopolamine patch placed behind your ear for the management of post- operative nausea and/or vomiting:  1. The medication in the patch is effective for 72 hours, after which it should be removed.  Wrap patch in a tissue and discard in the trash. Wash hands thoroughly with soap and water. 2. You may remove the patch earlier than 72 hours if you experience unpleasant side effects which may include dry mouth, dizziness or visual disturbances. 3. Avoid touching the patch. Wash your hands with soap and water after contact with the patch.    -----------------  POSTOPERATIVE INSTRUCTIONS FOR PATIENTS HAVING NASAL OR SINUS OPERATIONS ACTIVITY: Restrict activity at home for the first two days, resting as much as possible. Light activity is best. You may usually return to work within a week. You should refrain from nose blowing, strenuous activity, or heavy lifting greater than 20lbs for a total of one week after your operation.  If sneezing cannot be avoided, sneeze with your mouth  open. DISCOMFORT: You may experience a dull headache and pressure along with nasal congestion and discharge. These symptoms may be worse during the first week after the operation but may last as long as two to four weeks.  Please take Tylenol or the pain medication that has been prescribed for you. Do not take aspirin or aspirin containing medications since they may cause bleeding.  You may experience symptoms of post nasal drainage, nasal congestion, headaches and fatigue for two or three months after your operation.  BLEEDING: You may have some blood tinged nasal drainage for approximately two weeks after the operation.  The discharge will be worse for the first week.  Please call our office at [phone] or go to the nearest hospital emergency room if you experience any of the following: heavy, bright red blood from your nose or mouth that lasts longer than 15 minutes or coughing up or vomiting bright red blood or blood clots. GENERAL CONSIDERATIONS: 1. A gauze dressing will be placed on your upper lip to absorb any drainage after the operation. You may need to change this several times a day.  If you do not have very much drainage, you may remove the dressing.  Remember that you may gently wipe your nose with a tissue and sniff in, but DO NOT blow your nose. 2. Please keep all of your postoperative appointments.  Your final results after the operation will depend on proper follow-up.  The initial visit is usually 2 to 5 days after the operation.  During this visit, the remaining nasal packing and internal septal splints will be removed.  Your nasal and sinus cavities will   be cleaned.  During the second visit, your nasal and sinus cavities will be cleaned again. Have someone drive you to your first two postoperative appointments.  3. How you care for your nose after the operation will influence the results that you obtain.  You should follow all directions, take your medication as prescribed, and call  our office [phone] with any problems or questions. 4. You may be more comfortable sleeping with your head elevated on two pillows. 5. Do not take any medications that we have not prescribed or recommended. WARNING SIGNS: if any of the following should occur, please call our office: 1. Persistent fever greater than 102F. 2. Persistent vomiting. 3. Severe and constant pain that is not relieved by prescribed pain medication. 4. Trauma to the nose. 5. Rash or unusual side effects from any medicines.  

## 2020-11-08 NOTE — Anesthesia Procedure Notes (Signed)
Procedure Name: Intubation Date/Time: 11/08/2020 9:10 AM Performed by: Lieutenant Diego, CRNA Pre-anesthesia Checklist: Patient identified, Emergency Drugs available, Suction available and Patient being monitored Patient Re-evaluated:Patient Re-evaluated prior to induction Oxygen Delivery Method: Circle system utilized Preoxygenation: Pre-oxygenation with 100% oxygen Induction Type: IV induction Ventilation: Mask ventilation without difficulty Laryngoscope Size: Miller and 2 Grade View: Grade I Tube type: Oral Tube size: 7.0 mm Number of attempts: 1 Airway Equipment and Method: Stylet and Oral airway Placement Confirmation: ETT inserted through vocal cords under direct vision,  positive ETCO2 and breath sounds checked- equal and bilateral Tube secured with: Tape Dental Injury: Teeth and Oropharynx as per pre-operative assessment

## 2020-11-10 ENCOUNTER — Encounter (HOSPITAL_BASED_OUTPATIENT_CLINIC_OR_DEPARTMENT_OTHER): Payer: Self-pay | Admitting: Otolaryngology

## 2020-11-10 DIAGNOSIS — J321 Chronic frontal sinusitis: Secondary | ICD-10-CM | POA: Diagnosis not present

## 2020-11-10 DIAGNOSIS — J338 Other polyp of sinus: Secondary | ICD-10-CM | POA: Diagnosis not present

## 2020-11-10 DIAGNOSIS — J320 Chronic maxillary sinusitis: Secondary | ICD-10-CM | POA: Diagnosis not present

## 2020-11-11 ENCOUNTER — Telehealth: Payer: Self-pay | Admitting: Nurse Practitioner

## 2020-11-11 NOTE — Telephone Encounter (Signed)
PA attempted for Ubrelvy 50 mg tablets. Await decision

## 2020-11-15 NOTE — Telephone Encounter (Signed)
Ubrelvy 50mg  tablet was denied. Drug is non - formulary.  Must try and fail naratripatn or rizatriptan.

## 2020-12-01 DIAGNOSIS — J321 Chronic frontal sinusitis: Secondary | ICD-10-CM | POA: Diagnosis not present

## 2020-12-01 DIAGNOSIS — J338 Other polyp of sinus: Secondary | ICD-10-CM | POA: Diagnosis not present

## 2020-12-01 DIAGNOSIS — R519 Headache, unspecified: Secondary | ICD-10-CM | POA: Diagnosis not present

## 2020-12-01 DIAGNOSIS — J320 Chronic maxillary sinusitis: Secondary | ICD-10-CM | POA: Diagnosis not present

## 2020-12-01 DIAGNOSIS — F429 Obsessive-compulsive disorder, unspecified: Secondary | ICD-10-CM | POA: Diagnosis not present

## 2020-12-01 DIAGNOSIS — F4310 Post-traumatic stress disorder, unspecified: Secondary | ICD-10-CM | POA: Diagnosis not present

## 2020-12-01 NOTE — Telephone Encounter (Signed)
Patient has decided to seek a referral to neurologist at [facility]. Has had sinus surgery with continued headaches. Continue follow up with ENT.

## 2020-12-12 DIAGNOSIS — D509 Iron deficiency anemia, unspecified: Secondary | ICD-10-CM | POA: Diagnosis not present

## 2020-12-12 DIAGNOSIS — E538 Deficiency of other specified B group vitamins: Secondary | ICD-10-CM | POA: Diagnosis not present

## 2020-12-12 DIAGNOSIS — Z86718 Personal history of other venous thrombosis and embolism: Secondary | ICD-10-CM | POA: Diagnosis not present

## 2020-12-21 DIAGNOSIS — L603 Nail dystrophy: Secondary | ICD-10-CM | POA: Diagnosis not present

## 2020-12-21 DIAGNOSIS — L818 Other specified disorders of pigmentation: Secondary | ICD-10-CM | POA: Diagnosis not present

## 2020-12-21 DIAGNOSIS — L814 Other melanin hyperpigmentation: Secondary | ICD-10-CM | POA: Diagnosis not present

## 2020-12-21 DIAGNOSIS — L659 Nonscarring hair loss, unspecified: Secondary | ICD-10-CM | POA: Diagnosis not present

## 2020-12-22 DIAGNOSIS — J321 Chronic frontal sinusitis: Secondary | ICD-10-CM | POA: Diagnosis not present

## 2020-12-22 DIAGNOSIS — J320 Chronic maxillary sinusitis: Secondary | ICD-10-CM | POA: Diagnosis not present

## 2020-12-22 DIAGNOSIS — J338 Other polyp of sinus: Secondary | ICD-10-CM | POA: Diagnosis not present

## 2021-01-02 DIAGNOSIS — D509 Iron deficiency anemia, unspecified: Secondary | ICD-10-CM | POA: Diagnosis not present

## 2021-01-16 DIAGNOSIS — F4310 Post-traumatic stress disorder, unspecified: Secondary | ICD-10-CM | POA: Diagnosis not present

## 2021-01-16 DIAGNOSIS — F429 Obsessive-compulsive disorder, unspecified: Secondary | ICD-10-CM | POA: Diagnosis not present

## 2021-01-17 DIAGNOSIS — Z8742 Personal history of other diseases of the female genital tract: Secondary | ICD-10-CM | POA: Diagnosis not present

## 2021-01-17 DIAGNOSIS — Z9884 Bariatric surgery status: Secondary | ICD-10-CM | POA: Diagnosis not present

## 2021-01-17 DIAGNOSIS — D509 Iron deficiency anemia, unspecified: Secondary | ICD-10-CM | POA: Diagnosis not present

## 2021-01-17 DIAGNOSIS — R102 Pelvic and perineal pain: Secondary | ICD-10-CM | POA: Diagnosis not present

## 2021-01-17 DIAGNOSIS — N761 Subacute and chronic vaginitis: Secondary | ICD-10-CM | POA: Diagnosis not present

## 2021-01-17 DIAGNOSIS — E785 Hyperlipidemia, unspecified: Secondary | ICD-10-CM | POA: Diagnosis not present

## 2021-01-17 DIAGNOSIS — R1032 Left lower quadrant pain: Secondary | ICD-10-CM | POA: Diagnosis not present

## 2021-01-17 DIAGNOSIS — R1031 Right lower quadrant pain: Secondary | ICD-10-CM | POA: Diagnosis not present

## 2021-01-19 DIAGNOSIS — J320 Chronic maxillary sinusitis: Secondary | ICD-10-CM | POA: Diagnosis not present

## 2021-01-19 DIAGNOSIS — J321 Chronic frontal sinusitis: Secondary | ICD-10-CM | POA: Diagnosis not present

## 2021-01-25 DIAGNOSIS — G43719 Chronic migraine without aura, intractable, without status migrainosus: Secondary | ICD-10-CM | POA: Diagnosis not present

## 2021-01-27 DIAGNOSIS — Z30431 Encounter for routine checking of intrauterine contraceptive device: Secondary | ICD-10-CM | POA: Diagnosis not present

## 2021-01-27 DIAGNOSIS — R102 Pelvic and perineal pain: Secondary | ICD-10-CM | POA: Diagnosis not present

## 2021-01-27 DIAGNOSIS — N719 Inflammatory disease of uterus, unspecified: Secondary | ICD-10-CM | POA: Diagnosis not present

## 2021-02-08 DIAGNOSIS — R102 Pelvic and perineal pain: Secondary | ICD-10-CM | POA: Diagnosis not present

## 2021-02-08 DIAGNOSIS — Z30431 Encounter for routine checking of intrauterine contraceptive device: Secondary | ICD-10-CM | POA: Diagnosis not present

## 2021-02-13 DIAGNOSIS — F3132 Bipolar disorder, current episode depressed, moderate: Secondary | ICD-10-CM | POA: Diagnosis not present

## 2021-02-13 DIAGNOSIS — F4310 Post-traumatic stress disorder, unspecified: Secondary | ICD-10-CM | POA: Diagnosis not present

## 2021-02-14 ENCOUNTER — Inpatient Hospital Stay: Payer: Medicare HMO

## 2021-02-14 ENCOUNTER — Encounter: Payer: Self-pay | Admitting: Oncology

## 2021-02-14 ENCOUNTER — Other Ambulatory Visit: Payer: Self-pay

## 2021-02-14 ENCOUNTER — Inpatient Hospital Stay: Payer: Medicare HMO | Attending: Oncology | Admitting: Oncology

## 2021-02-14 VITALS — BP 134/76 | HR 71 | Temp 98.4°F | Wt 144.8 lb

## 2021-02-14 DIAGNOSIS — D509 Iron deficiency anemia, unspecified: Secondary | ICD-10-CM | POA: Insufficient documentation

## 2021-02-14 DIAGNOSIS — D508 Other iron deficiency anemias: Secondary | ICD-10-CM | POA: Diagnosis not present

## 2021-02-14 DIAGNOSIS — Z9884 Bariatric surgery status: Secondary | ICD-10-CM | POA: Diagnosis not present

## 2021-02-14 DIAGNOSIS — Z86718 Personal history of other venous thrombosis and embolism: Secondary | ICD-10-CM | POA: Diagnosis not present

## 2021-02-14 HISTORY — DX: Bariatric surgery status: Z98.84

## 2021-02-14 LAB — CBC WITH DIFFERENTIAL/PLATELET
Abs Immature Granulocytes: 0.02 10*3/uL (ref 0.00–0.07)
Basophils Absolute: 0.1 10*3/uL (ref 0.0–0.1)
Basophils Relative: 1 %
Eosinophils Absolute: 0.1 10*3/uL (ref 0.0–0.5)
Eosinophils Relative: 1 %
HCT: 43.7 % (ref 36.0–46.0)
Hemoglobin: 14.2 g/dL (ref 12.0–15.0)
Immature Granulocytes: 0 %
Lymphocytes Relative: 32 %
Lymphs Abs: 2.6 10*3/uL (ref 0.7–4.0)
MCH: 30.7 pg (ref 26.0–34.0)
MCHC: 32.5 g/dL (ref 30.0–36.0)
MCV: 94.6 fL (ref 80.0–100.0)
Monocytes Absolute: 0.5 10*3/uL (ref 0.1–1.0)
Monocytes Relative: 6 %
Neutro Abs: 4.8 10*3/uL (ref 1.7–7.7)
Neutrophils Relative %: 60 %
Platelets: 225 10*3/uL (ref 150–400)
RBC: 4.62 MIL/uL (ref 3.87–5.11)
RDW: 13.8 % (ref 11.5–15.5)
WBC: 8.1 10*3/uL (ref 4.0–10.5)
nRBC: 0 % (ref 0.0–0.2)

## 2021-02-14 LAB — IRON AND TIBC
Iron: 116 ug/dL (ref 28–170)
Saturation Ratios: 31 % (ref 10.4–31.8)
TIBC: 371 ug/dL (ref 250–450)
UIBC: 255 ug/dL

## 2021-02-14 LAB — RETIC PANEL
Immature Retic Fract: 5.3 % (ref 2.3–15.9)
RBC.: 4.7 MIL/uL (ref 3.87–5.11)
Retic Count, Absolute: 85.5 10*3/uL (ref 19.0–186.0)
Retic Ct Pct: 1.8 % (ref 0.4–3.1)
Reticulocyte Hemoglobin: 33.7 pg (ref >27.9)

## 2021-02-14 LAB — TECHNOLOGIST SMEAR REVIEW
Plt Morphology: NORMAL
RBC MORPHOLOGY: NORMAL
WBC MORPHOLOGY: NORMAL

## 2021-02-14 LAB — PREGNANCY, URINE: Preg Test, Ur: NEGATIVE

## 2021-02-14 LAB — VITAMIN B12: Vitamin B-12: 200 pg/mL (ref 180–914)

## 2021-02-14 LAB — FERRITIN: Ferritin: 77 ng/mL (ref 11–307)

## 2021-02-14 LAB — FOLATE: Folate: 28 ng/mL (ref >5.9)
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
## 2021-02-15 ENCOUNTER — Other Ambulatory Visit: Payer: Self-pay | Admitting: Oncology

## 2021-02-15 ENCOUNTER — Telehealth: Payer: Self-pay

## 2021-02-15 ENCOUNTER — Encounter: Payer: Self-pay | Admitting: Oncology

## 2021-02-15 NOTE — Telephone Encounter (Signed)
Please cancel appt on 9/16 for iron and change appt in March from MD to NP.   Pt informed of changes via Mychart.

## 2021-02-15 NOTE — Telephone Encounter (Signed)
-----   Message from Earlie Server, MD sent at 02/14/2021  8:19 PM EDT ----- Please let patient know that iron panel shows improvement of iron store and also hemoglobin remained stable he normal.  There is no significant iron deficiency at this point.  I recommend her to hold off IV iron treatments-cancel appointment please. She can follow-up in 6 months for recheck.  Please have her scheduled with nurse practitioner.

## 2021-02-16 ENCOUNTER — Encounter: Payer: Self-pay | Admitting: Oncology

## 2021-02-17 ENCOUNTER — Inpatient Hospital Stay: Payer: Medicare HMO

## 2021-02-24 DIAGNOSIS — N941 Unspecified dyspareunia: Secondary | ICD-10-CM | POA: Diagnosis not present

## 2021-02-24 DIAGNOSIS — R102 Pelvic and perineal pain: Secondary | ICD-10-CM | POA: Diagnosis not present

## 2021-02-24 DIAGNOSIS — Z302 Encounter for sterilization: Secondary | ICD-10-CM | POA: Diagnosis not present

## 2021-02-24 DIAGNOSIS — N719 Inflammatory disease of uterus, unspecified: Secondary | ICD-10-CM | POA: Diagnosis not present

## 2021-03-08 DIAGNOSIS — R1033 Periumbilical pain: Secondary | ICD-10-CM | POA: Diagnosis not present

## 2021-03-08 DIAGNOSIS — R197 Diarrhea, unspecified: Secondary | ICD-10-CM | POA: Diagnosis not present

## 2021-03-08 DIAGNOSIS — R1110 Vomiting, unspecified: Secondary | ICD-10-CM | POA: Diagnosis not present

## 2021-03-08 DIAGNOSIS — R109 Unspecified abdominal pain: Secondary | ICD-10-CM | POA: Diagnosis not present

## 2021-03-08 DIAGNOSIS — R10815 Periumbilic abdominal tenderness: Secondary | ICD-10-CM | POA: Diagnosis not present

## 2021-03-08 DIAGNOSIS — F17210 Nicotine dependence, cigarettes, uncomplicated: Secondary | ICD-10-CM | POA: Diagnosis not present

## 2021-03-08 DIAGNOSIS — R112 Nausea with vomiting, unspecified: Secondary | ICD-10-CM | POA: Diagnosis not present

## 2021-03-14 DIAGNOSIS — D509 Iron deficiency anemia, unspecified: Secondary | ICD-10-CM | POA: Diagnosis not present

## 2021-03-14 DIAGNOSIS — Z86718 Personal history of other venous thrombosis and embolism: Secondary | ICD-10-CM | POA: Diagnosis not present

## 2021-03-14 DIAGNOSIS — E538 Deficiency of other specified B group vitamins: Secondary | ICD-10-CM | POA: Diagnosis not present

## 2021-03-16 DIAGNOSIS — F3132 Bipolar disorder, current episode depressed, moderate: Secondary | ICD-10-CM | POA: Diagnosis not present

## 2021-03-16 DIAGNOSIS — F900 Attention-deficit hyperactivity disorder, predominantly inattentive type: Secondary | ICD-10-CM | POA: Diagnosis not present

## 2021-04-06 DIAGNOSIS — R051 Acute cough: Secondary | ICD-10-CM | POA: Diagnosis not present

## 2021-04-06 DIAGNOSIS — Z03818 Encounter for observation for suspected exposure to other biological agents ruled out: Secondary | ICD-10-CM | POA: Diagnosis not present

## 2021-04-20 DIAGNOSIS — J338 Other polyp of sinus: Secondary | ICD-10-CM | POA: Diagnosis not present

## 2021-04-20 DIAGNOSIS — J324 Chronic pansinusitis: Secondary | ICD-10-CM | POA: Diagnosis not present

## 2021-04-25 DIAGNOSIS — F900 Attention-deficit hyperactivity disorder, predominantly inattentive type: Secondary | ICD-10-CM | POA: Diagnosis not present

## 2021-05-12 DIAGNOSIS — D582 Other hemoglobinopathies: Secondary | ICD-10-CM | POA: Diagnosis not present

## 2021-05-12 DIAGNOSIS — E538 Deficiency of other specified B group vitamins: Secondary | ICD-10-CM | POA: Diagnosis not present

## 2021-05-12 DIAGNOSIS — D509 Iron deficiency anemia, unspecified: Secondary | ICD-10-CM | POA: Diagnosis not present

## 2021-05-16 DIAGNOSIS — E785 Hyperlipidemia, unspecified: Secondary | ICD-10-CM | POA: Diagnosis not present

## 2021-05-16 DIAGNOSIS — F4310 Post-traumatic stress disorder, unspecified: Secondary | ICD-10-CM | POA: Diagnosis not present

## 2021-05-23 DIAGNOSIS — E538 Deficiency of other specified B group vitamins: Secondary | ICD-10-CM | POA: Diagnosis not present

## 2021-05-23 DIAGNOSIS — R58 Hemorrhage, not elsewhere classified: Secondary | ICD-10-CM | POA: Diagnosis not present

## 2021-05-23 DIAGNOSIS — D582 Other hemoglobinopathies: Secondary | ICD-10-CM | POA: Diagnosis not present

## 2021-05-23 DIAGNOSIS — Z0000 Encounter for general adult medical examination without abnormal findings: Secondary | ICD-10-CM | POA: Diagnosis not present

## 2021-05-30 DIAGNOSIS — F4310 Post-traumatic stress disorder, unspecified: Secondary | ICD-10-CM | POA: Diagnosis not present

## 2021-05-30 DIAGNOSIS — F429 Obsessive-compulsive disorder, unspecified: Secondary | ICD-10-CM | POA: Diagnosis not present

## 2021-06-22 DIAGNOSIS — D582 Other hemoglobinopathies: Secondary | ICD-10-CM | POA: Diagnosis not present

## 2021-06-23 DIAGNOSIS — E611 Iron deficiency: Secondary | ICD-10-CM | POA: Diagnosis not present

## 2021-07-24 DIAGNOSIS — F429 Obsessive-compulsive disorder, unspecified: Secondary | ICD-10-CM | POA: Diagnosis not present

## 2021-07-24 DIAGNOSIS — F3132 Bipolar disorder, current episode depressed, moderate: Secondary | ICD-10-CM | POA: Diagnosis not present

## 2021-07-24 DIAGNOSIS — F900 Attention-deficit hyperactivity disorder, predominantly inattentive type: Secondary | ICD-10-CM | POA: Diagnosis not present

## 2021-07-28 DIAGNOSIS — G43719 Chronic migraine without aura, intractable, without status migrainosus: Secondary | ICD-10-CM | POA: Diagnosis not present

## 2021-08-02 DIAGNOSIS — Z832 Family history of diseases of the blood and blood-forming organs and certain disorders involving the immune mechanism: Secondary | ICD-10-CM | POA: Diagnosis not present

## 2021-08-02 DIAGNOSIS — M2550 Pain in unspecified joint: Secondary | ICD-10-CM | POA: Diagnosis not present

## 2021-08-02 DIAGNOSIS — R635 Abnormal weight gain: Secondary | ICD-10-CM | POA: Diagnosis not present

## 2021-08-02 DIAGNOSIS — B070 Plantar wart: Secondary | ICD-10-CM | POA: Diagnosis not present

## 2021-08-02 DIAGNOSIS — M7989 Other specified soft tissue disorders: Secondary | ICD-10-CM | POA: Diagnosis not present

## 2021-08-02 DIAGNOSIS — D509 Iron deficiency anemia, unspecified: Secondary | ICD-10-CM | POA: Diagnosis not present

## 2021-08-02 DIAGNOSIS — E538 Deficiency of other specified B group vitamins: Secondary | ICD-10-CM | POA: Diagnosis not present

## 2021-08-08 ENCOUNTER — Other Ambulatory Visit: Payer: Self-pay | Admitting: *Deleted

## 2021-08-09 DIAGNOSIS — M2042 Other hammer toe(s) (acquired), left foot: Secondary | ICD-10-CM | POA: Diagnosis not present

## 2021-08-09 DIAGNOSIS — M25571 Pain in right ankle and joints of right foot: Secondary | ICD-10-CM | POA: Diagnosis not present

## 2021-08-09 DIAGNOSIS — M2041 Other hammer toe(s) (acquired), right foot: Secondary | ICD-10-CM | POA: Diagnosis not present

## 2021-08-09 DIAGNOSIS — M65871 Other synovitis and tenosynovitis, right ankle and foot: Secondary | ICD-10-CM | POA: Diagnosis not present

## 2021-08-09 DIAGNOSIS — M7671 Peroneal tendinitis, right leg: Secondary | ICD-10-CM | POA: Diagnosis not present

## 2021-08-09 DIAGNOSIS — M898X9 Other specified disorders of bone, unspecified site: Secondary | ICD-10-CM | POA: Diagnosis not present

## 2021-08-15 ENCOUNTER — Other Ambulatory Visit: Payer: Medicaid Other

## 2021-08-17 ENCOUNTER — Inpatient Hospital Stay: Payer: Medicare HMO | Attending: Oncology | Admitting: Nurse Practitioner

## 2021-08-17 ENCOUNTER — Encounter: Payer: Self-pay | Admitting: Nurse Practitioner

## 2021-08-17 ENCOUNTER — Other Ambulatory Visit: Payer: Self-pay

## 2021-08-17 ENCOUNTER — Inpatient Hospital Stay: Payer: Medicare HMO

## 2021-08-17 VITALS — BP 122/89 | HR 79 | Temp 97.0°F | Resp 18 | Wt 155.3 lb

## 2021-08-17 DIAGNOSIS — D508 Other iron deficiency anemias: Secondary | ICD-10-CM | POA: Diagnosis not present

## 2021-08-17 DIAGNOSIS — R5383 Other fatigue: Secondary | ICD-10-CM | POA: Diagnosis not present

## 2021-08-17 DIAGNOSIS — Z91014 Allergy to mammalian meats: Secondary | ICD-10-CM | POA: Insufficient documentation

## 2021-08-17 DIAGNOSIS — K909 Intestinal malabsorption, unspecified: Secondary | ICD-10-CM | POA: Diagnosis not present

## 2021-08-17 DIAGNOSIS — Z9884 Bariatric surgery status: Secondary | ICD-10-CM | POA: Diagnosis not present

## 2021-08-17 DIAGNOSIS — E538 Deficiency of other specified B group vitamins: Secondary | ICD-10-CM

## 2021-08-17 DIAGNOSIS — Z86718 Personal history of other venous thrombosis and embolism: Secondary | ICD-10-CM | POA: Diagnosis not present

## 2021-08-17 MED ORDER — CYANOCOBALAMIN 1000 MCG/ML IJ SOLN: INTRAMUSCULAR | 0 refills | Status: AC

## 2021-08-17 MED ORDER — BD SAFETYGLIDE SYRINGE/NEEDLE 25G X 1" 3 ML MISC
0 refills | Status: AC
Start: 2021-08-17 — End: ?
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
## 2021-08-17 NOTE — Progress Notes (Signed)
Patient here for follow up. Pt would like to discuss labwork in details.  ?

## 2021-08-22 DIAGNOSIS — D751 Secondary polycythemia: Secondary | ICD-10-CM | POA: Diagnosis not present

## 2021-08-22 DIAGNOSIS — D689 Coagulation defect, unspecified: Secondary | ICD-10-CM | POA: Diagnosis not present

## 2021-08-22 DIAGNOSIS — I82409 Acute embolism and thrombosis of unspecified deep veins of unspecified lower extremity: Secondary | ICD-10-CM | POA: Diagnosis not present

## 2021-08-22 DIAGNOSIS — D649 Anemia, unspecified: Secondary | ICD-10-CM | POA: Diagnosis not present

## 2021-08-30 DIAGNOSIS — G5762 Lesion of plantar nerve, left lower limb: Secondary | ICD-10-CM | POA: Diagnosis not present

## 2021-08-30 DIAGNOSIS — M65871 Other synovitis and tenosynovitis, right ankle and foot: Secondary | ICD-10-CM | POA: Diagnosis not present

## 2021-08-30 DIAGNOSIS — M898X9 Other specified disorders of bone, unspecified site: Secondary | ICD-10-CM | POA: Diagnosis not present

## 2021-08-30 DIAGNOSIS — M7671 Peroneal tendinitis, right leg: Secondary | ICD-10-CM | POA: Diagnosis not present

## 2021-08-30 DIAGNOSIS — M2041 Other hammer toe(s) (acquired), right foot: Secondary | ICD-10-CM | POA: Diagnosis not present

## 2021-08-30 DIAGNOSIS — R768 Other specified abnormal immunological findings in serum: Secondary | ICD-10-CM | POA: Diagnosis not present

## 2021-08-30 DIAGNOSIS — M2042 Other hammer toe(s) (acquired), left foot: Secondary | ICD-10-CM | POA: Diagnosis not present

## 2021-09-11 DIAGNOSIS — R5382 Chronic fatigue, unspecified: Secondary | ICD-10-CM | POA: Diagnosis not present

## 2021-09-11 DIAGNOSIS — M2550 Pain in unspecified joint: Secondary | ICD-10-CM | POA: Diagnosis not present

## 2021-09-11 DIAGNOSIS — R768 Other specified abnormal immunological findings in serum: Secondary | ICD-10-CM | POA: Diagnosis not present

## 2021-09-11 DIAGNOSIS — M3500 Sicca syndrome, unspecified: Secondary | ICD-10-CM | POA: Diagnosis not present

## 2021-09-11 DIAGNOSIS — M791 Myalgia, unspecified site: Secondary | ICD-10-CM | POA: Diagnosis not present

## 2021-09-12 DIAGNOSIS — M2550 Pain in unspecified joint: Secondary | ICD-10-CM | POA: Diagnosis not present

## 2021-09-12 DIAGNOSIS — R5382 Chronic fatigue, unspecified: Secondary | ICD-10-CM | POA: Diagnosis not present

## 2021-09-12 DIAGNOSIS — R768 Other specified abnormal immunological findings in serum: Secondary | ICD-10-CM | POA: Diagnosis not present

## 2021-09-12 DIAGNOSIS — M791 Myalgia, unspecified site: Secondary | ICD-10-CM | POA: Diagnosis not present

## 2021-09-12 DIAGNOSIS — M3500 Sicca syndrome, unspecified: Secondary | ICD-10-CM | POA: Diagnosis not present

## 2021-09-14 ENCOUNTER — Encounter: Payer: Self-pay | Admitting: Oncology

## 2021-09-28 ENCOUNTER — Inpatient Hospital Stay: Payer: Medicaid Other

## 2021-09-29 DIAGNOSIS — Z1231 Encounter for screening mammogram for malignant neoplasm of breast: Secondary | ICD-10-CM | POA: Diagnosis not present

## 2021-09-29 DIAGNOSIS — H524 Presbyopia: Secondary | ICD-10-CM | POA: Diagnosis not present

## 2021-10-02 DIAGNOSIS — Z111 Encounter for screening for respiratory tuberculosis: Secondary | ICD-10-CM | POA: Diagnosis not present

## 2021-10-02 DIAGNOSIS — M25571 Pain in right ankle and joints of right foot: Secondary | ICD-10-CM | POA: Diagnosis not present

## 2021-10-02 DIAGNOSIS — G8929 Other chronic pain: Secondary | ICD-10-CM | POA: Diagnosis not present

## 2021-10-02 DIAGNOSIS — Z0100 Encounter for examination of eyes and vision without abnormal findings: Secondary | ICD-10-CM | POA: Diagnosis not present

## 2021-10-02 DIAGNOSIS — R768 Other specified abnormal immunological findings in serum: Secondary | ICD-10-CM | POA: Diagnosis not present

## 2021-10-17 ENCOUNTER — Other Ambulatory Visit: Payer: Self-pay | Admitting: Nurse Practitioner

## 2021-10-19 DIAGNOSIS — J324 Chronic pansinusitis: Secondary | ICD-10-CM | POA: Diagnosis not present

## 2021-10-19 DIAGNOSIS — J338 Other polyp of sinus: Secondary | ICD-10-CM | POA: Diagnosis not present

## 2021-10-22 DIAGNOSIS — J22 Unspecified acute lower respiratory infection: Secondary | ICD-10-CM | POA: Diagnosis not present

## 2021-10-22 DIAGNOSIS — Z86718 Personal history of other venous thrombosis and embolism: Secondary | ICD-10-CM | POA: Diagnosis not present

## 2021-10-22 DIAGNOSIS — R042 Hemoptysis: Secondary | ICD-10-CM | POA: Diagnosis not present

## 2021-10-22 DIAGNOSIS — R519 Headache, unspecified: Secondary | ICD-10-CM | POA: Diagnosis not present

## 2021-10-22 DIAGNOSIS — R140 Abdominal distension (gaseous): Secondary | ICD-10-CM | POA: Diagnosis not present

## 2021-10-22 DIAGNOSIS — J019 Acute sinusitis, unspecified: Secondary | ICD-10-CM | POA: Diagnosis not present

## 2021-10-22 DIAGNOSIS — B9689 Other specified bacterial agents as the cause of diseases classified elsewhere: Secondary | ICD-10-CM | POA: Diagnosis not present

## 2021-10-22 DIAGNOSIS — R053 Chronic cough: Secondary | ICD-10-CM | POA: Diagnosis not present

## 2021-10-23 DIAGNOSIS — F419 Anxiety disorder, unspecified: Secondary | ICD-10-CM | POA: Diagnosis not present

## 2021-10-23 DIAGNOSIS — F3132 Bipolar disorder, current episode depressed, moderate: Secondary | ICD-10-CM | POA: Diagnosis not present

## 2021-10-24 DIAGNOSIS — J019 Acute sinusitis, unspecified: Secondary | ICD-10-CM | POA: Diagnosis not present

## 2021-10-24 DIAGNOSIS — R1310 Dysphagia, unspecified: Secondary | ICD-10-CM | POA: Diagnosis not present

## 2021-10-24 DIAGNOSIS — J384 Edema of larynx: Secondary | ICD-10-CM | POA: Diagnosis not present

## 2021-10-24 DIAGNOSIS — R490 Dysphonia: Secondary | ICD-10-CM | POA: Diagnosis not present

## 2021-11-22 ENCOUNTER — Ambulatory Visit: Payer: Medicaid Other | Admitting: Oncology

## 2021-11-22 ENCOUNTER — Other Ambulatory Visit: Payer: Medicaid Other

## 2021-11-29 ENCOUNTER — Ambulatory Visit: Payer: Medicaid Other

## 2021-11-29 ENCOUNTER — Ambulatory Visit: Payer: Medicaid Other | Admitting: Oncology

## 2021-11-30 DIAGNOSIS — R490 Dysphonia: Secondary | ICD-10-CM | POA: Diagnosis not present

## 2021-11-30 DIAGNOSIS — J069 Acute upper respiratory infection, unspecified: Secondary | ICD-10-CM | POA: Diagnosis not present

## 2021-11-30 DIAGNOSIS — J384 Edema of larynx: Secondary | ICD-10-CM | POA: Diagnosis not present

## 2021-12-06 DIAGNOSIS — M130 Polyarthritis, unspecified: Secondary | ICD-10-CM | POA: Diagnosis not present

## 2021-12-06 DIAGNOSIS — E538 Deficiency of other specified B group vitamins: Secondary | ICD-10-CM | POA: Diagnosis not present

## 2021-12-06 DIAGNOSIS — M25541 Pain in joints of right hand: Secondary | ICD-10-CM | POA: Diagnosis not present

## 2021-12-06 DIAGNOSIS — M25542 Pain in joints of left hand: Secondary | ICD-10-CM | POA: Diagnosis not present

## 2021-12-06 DIAGNOSIS — E611 Iron deficiency: Secondary | ICD-10-CM | POA: Diagnosis not present

## 2022-01-25 DIAGNOSIS — F3132 Bipolar disorder, current episode depressed, moderate: Secondary | ICD-10-CM | POA: Diagnosis not present

## 2022-01-25 DIAGNOSIS — F429 Obsessive-compulsive disorder, unspecified: Secondary | ICD-10-CM | POA: Diagnosis not present

## 2022-02-19 DIAGNOSIS — Z03818 Encounter for observation for suspected exposure to other biological agents ruled out: Secondary | ICD-10-CM | POA: Diagnosis not present

## 2022-02-19 DIAGNOSIS — R051 Acute cough: Secondary | ICD-10-CM | POA: Diagnosis not present

## 2022-02-19 DIAGNOSIS — F17200 Nicotine dependence, unspecified, uncomplicated: Secondary | ICD-10-CM | POA: Diagnosis not present

## 2022-02-19 DIAGNOSIS — R350 Frequency of micturition: Secondary | ICD-10-CM | POA: Diagnosis not present

## 2022-03-09 DIAGNOSIS — F900 Attention-deficit hyperactivity disorder, predominantly inattentive type: Secondary | ICD-10-CM | POA: Diagnosis not present

## 2022-03-09 DIAGNOSIS — F3132 Bipolar disorder, current episode depressed, moderate: Secondary | ICD-10-CM | POA: Diagnosis not present

## 2022-03-21 DIAGNOSIS — Z23 Encounter for immunization: Secondary | ICD-10-CM | POA: Diagnosis not present

## 2022-03-27 ENCOUNTER — Other Ambulatory Visit: Payer: Self-pay | Admitting: Family Medicine

## 2022-03-27 DIAGNOSIS — R6882 Decreased libido: Secondary | ICD-10-CM | POA: Diagnosis not present

## 2022-03-27 DIAGNOSIS — R140 Abdominal distension (gaseous): Secondary | ICD-10-CM | POA: Diagnosis not present

## 2022-03-27 DIAGNOSIS — G43719 Chronic migraine without aura, intractable, without status migrainosus: Secondary | ICD-10-CM | POA: Diagnosis not present

## 2022-03-27 DIAGNOSIS — F5104 Psychophysiologic insomnia: Secondary | ICD-10-CM | POA: Diagnosis not present

## 2022-03-27 DIAGNOSIS — R454 Irritability and anger: Secondary | ICD-10-CM | POA: Diagnosis not present

## 2022-03-27 DIAGNOSIS — R1310 Dysphagia, unspecified: Secondary | ICD-10-CM | POA: Diagnosis not present

## 2022-03-27 DIAGNOSIS — N951 Menopausal and female climacteric states: Secondary | ICD-10-CM | POA: Diagnosis not present

## 2022-03-27 DIAGNOSIS — F17200 Nicotine dependence, unspecified, uncomplicated: Secondary | ICD-10-CM | POA: Diagnosis not present

## 2022-03-27 DIAGNOSIS — R635 Abnormal weight gain: Secondary | ICD-10-CM | POA: Diagnosis not present

## 2022-03-27 DIAGNOSIS — E119 Type 2 diabetes mellitus without complications: Secondary | ICD-10-CM | POA: Diagnosis not present

## 2022-03-30 ENCOUNTER — Other Ambulatory Visit: Payer: Self-pay | Admitting: Nurse Practitioner

## 2022-03-30 ENCOUNTER — Encounter: Payer: Self-pay | Admitting: Oncology

## 2022-04-02 ENCOUNTER — Other Ambulatory Visit: Payer: Medicaid Other

## 2022-04-04 DIAGNOSIS — F17200 Nicotine dependence, unspecified, uncomplicated: Secondary | ICD-10-CM | POA: Diagnosis not present

## 2022-04-04 DIAGNOSIS — R053 Chronic cough: Secondary | ICD-10-CM | POA: Diagnosis not present

## 2022-04-06 DIAGNOSIS — F3132 Bipolar disorder, current episode depressed, moderate: Secondary | ICD-10-CM | POA: Diagnosis not present

## 2022-04-06 DIAGNOSIS — F900 Attention-deficit hyperactivity disorder, predominantly inattentive type: Secondary | ICD-10-CM | POA: Diagnosis not present

## 2022-05-21 DIAGNOSIS — Z1331 Encounter for screening for depression: Secondary | ICD-10-CM | POA: Diagnosis not present

## 2022-05-21 DIAGNOSIS — Z1330 Encounter for screening examination for mental health and behavioral disorders, unspecified: Secondary | ICD-10-CM | POA: Diagnosis not present

## 2022-05-21 DIAGNOSIS — F900 Attention-deficit hyperactivity disorder, predominantly inattentive type: Secondary | ICD-10-CM | POA: Diagnosis not present

## 2022-07-02 ENCOUNTER — Telehealth: Payer: Self-pay | Admitting: Internal Medicine

## 2022-07-02 ENCOUNTER — Ambulatory Visit: Payer: BC Managed Care – PPO

## 2022-07-02 ENCOUNTER — Ambulatory Visit
Admission: EM | Admit: 2022-07-02 | Discharge: 2022-07-02 | Disposition: A | Discharge status: Home / Self Care | Payer: Medicare HMO

## 2022-07-02 ENCOUNTER — Encounter: Payer: Self-pay | Admitting: Oncology

## 2022-07-02 ENCOUNTER — Encounter: Payer: Self-pay | Admitting: Internal Medicine

## 2022-07-02 DIAGNOSIS — H60391 Other infective otitis externa, right ear: Secondary | ICD-10-CM | POA: Diagnosis not present

## 2022-07-02 DIAGNOSIS — J320 Chronic maxillary sinusitis: Secondary | ICD-10-CM | POA: Diagnosis not present

## 2022-07-02 MED ORDER — LEVOFLOXACIN 500 MG PO TABS: 500.0000 mg | ORAL_TABLET | Freq: Every day | ORAL | 0 refills | Status: DC

## 2022-07-02 MED ORDER — CIPROFLOXACIN-DEXAMETHASONE 0.3-0.1 % OT SUSP: 4.0000 [drp] | Freq: Two times a day (BID) | OTIC | 0 refills | Status: DC

## 2022-07-02 MED ORDER — FLUCONAZOLE 150 MG PO TABS: 150.0000 mg | ORAL_TABLET | Freq: Every day | ORAL | 0 refills | Status: DC

## 2022-07-02 MED ORDER — METHYLPREDNISOLONE 4 MG PO TBPK: ORAL_TABLET | ORAL | 0 refills | Status: DC

## 2022-07-02 NOTE — ED Triage Notes (Signed)
Pt reports sinus pressure, drainage  right ear fullness and pain x 2 days. Tylenol and Vicks Sinus gives no relief.

## 2022-07-02 NOTE — Discharge Instructions (Signed)
Follow up with new ENT so you can have that yellow matter or tissue cultured
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
## 2022-07-09 DIAGNOSIS — F4310 Post-traumatic stress disorder, unspecified: Secondary | ICD-10-CM | POA: Diagnosis not present

## 2022-07-09 DIAGNOSIS — F331 Major depressive disorder, recurrent, moderate: Secondary | ICD-10-CM | POA: Diagnosis not present

## 2022-07-09 DIAGNOSIS — F900 Attention-deficit hyperactivity disorder, predominantly inattentive type: Secondary | ICD-10-CM | POA: Diagnosis not present

## 2022-07-09 DIAGNOSIS — Z1330 Encounter for screening examination for mental health and behavioral disorders, unspecified: Secondary | ICD-10-CM | POA: Diagnosis not present

## 2022-07-09 DIAGNOSIS — Z1331 Encounter for screening for depression: Secondary | ICD-10-CM | POA: Diagnosis not present

## 2022-07-31 DIAGNOSIS — M79671 Pain in right foot: Secondary | ICD-10-CM | POA: Diagnosis not present

## 2022-07-31 DIAGNOSIS — S92531A Displaced fracture of distal phalanx of right lesser toe(s), initial encounter for closed fracture: Secondary | ICD-10-CM | POA: Diagnosis not present

## 2022-08-03 DIAGNOSIS — Z6827 Body mass index (BMI) 27.0-27.9, adult: Secondary | ICD-10-CM | POA: Diagnosis not present

## 2022-08-03 DIAGNOSIS — J069 Acute upper respiratory infection, unspecified: Secondary | ICD-10-CM | POA: Diagnosis not present

## 2022-08-03 DIAGNOSIS — E663 Overweight: Secondary | ICD-10-CM | POA: Diagnosis not present

## 2022-08-16 DIAGNOSIS — F419 Anxiety disorder, unspecified: Secondary | ICD-10-CM | POA: Diagnosis not present

## 2022-08-16 DIAGNOSIS — F331 Major depressive disorder, recurrent, moderate: Secondary | ICD-10-CM | POA: Diagnosis not present

## 2022-08-16 DIAGNOSIS — F900 Attention-deficit hyperactivity disorder, predominantly inattentive type: Secondary | ICD-10-CM | POA: Diagnosis not present

## 2022-08-16 DIAGNOSIS — F4310 Post-traumatic stress disorder, unspecified: Secondary | ICD-10-CM | POA: Diagnosis not present

## 2022-08-16 DIAGNOSIS — Z1330 Encounter for screening examination for mental health and behavioral disorders, unspecified: Secondary | ICD-10-CM | POA: Diagnosis not present

## 2022-08-16 DIAGNOSIS — Z1331 Encounter for screening for depression: Secondary | ICD-10-CM | POA: Diagnosis not present

## 2022-08-16 DIAGNOSIS — F429 Obsessive-compulsive disorder, unspecified: Secondary | ICD-10-CM | POA: Diagnosis not present

## 2022-08-28 DIAGNOSIS — D508 Other iron deficiency anemias: Secondary | ICD-10-CM | POA: Diagnosis not present

## 2022-08-28 DIAGNOSIS — K9589 Other complications of other bariatric procedure: Secondary | ICD-10-CM | POA: Diagnosis not present

## 2022-09-08 IMAGING — US US SOFT TISSUE HEAD/NECK
1 series · 7 of 7 positions shown · non-contrast
Comparison: Clinical photos in the EMR today.

CLINICAL DATA: 39-year-old female with left lower lip swelling for
6 days.

EXAM:
ULTRASOUND OF HEAD/NECK SOFT TISSUES
TECHNIQUE: Ultrasound examination of the head and neck soft tissues was
performed in the area of clinical concern.

[Series 1: us soft tissue head & neck (non-thyroid) · 7 acquisitions, 7 frames shown]
[im 1/7]
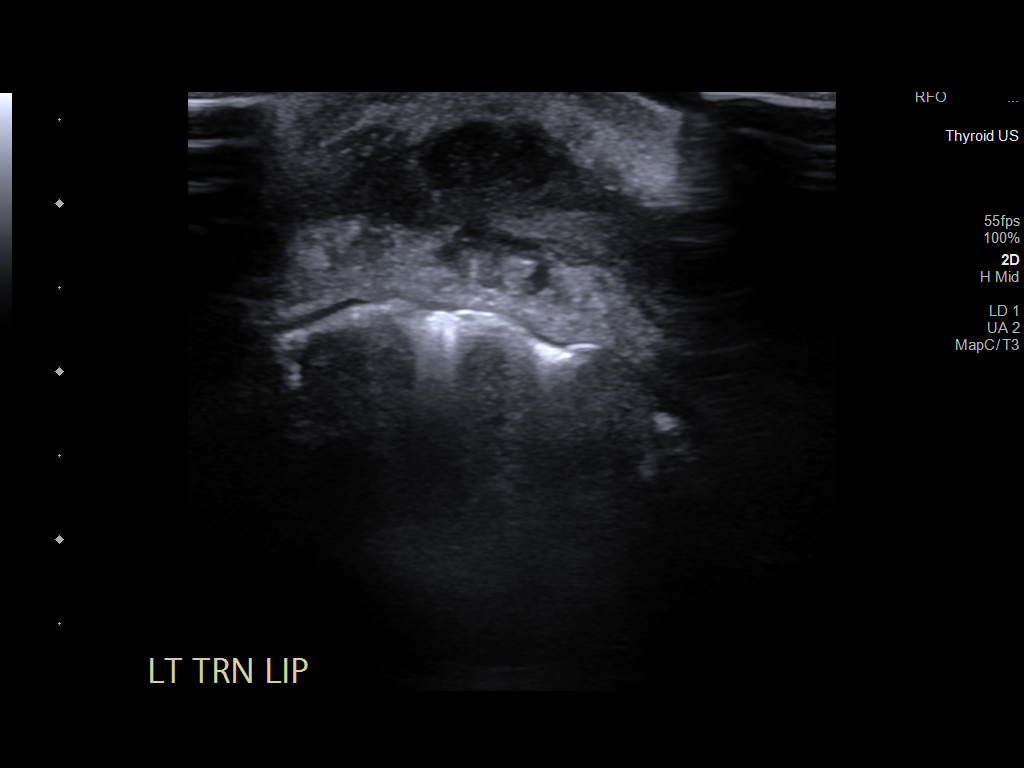
[im 2/7]
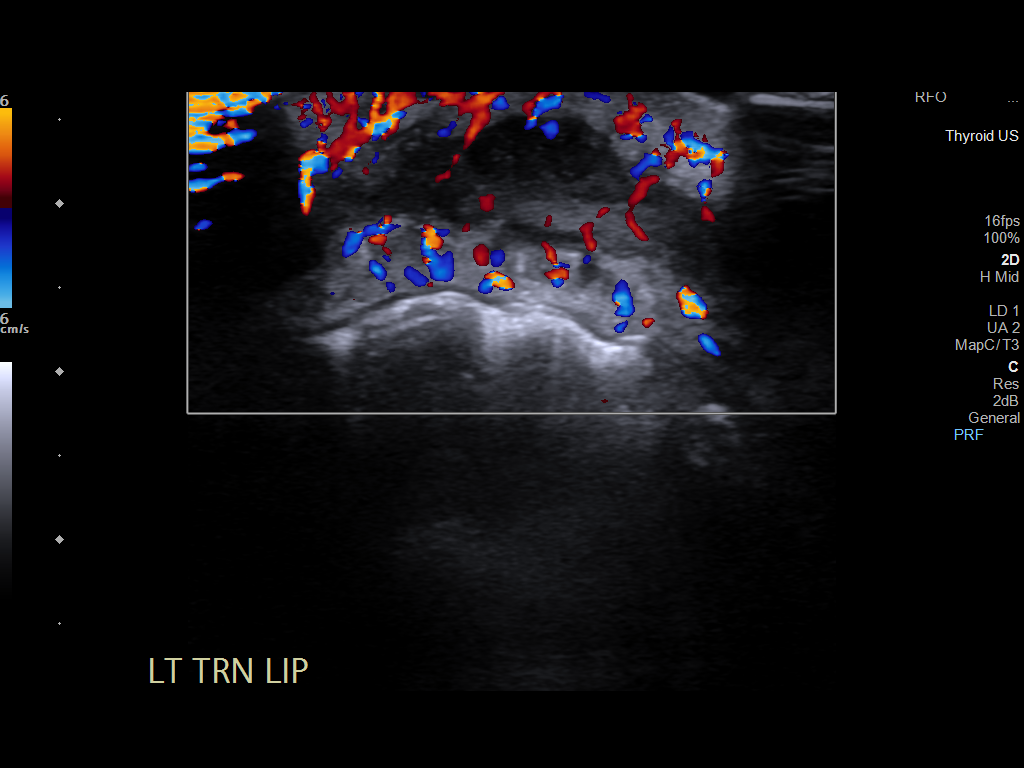
[im 3/7]
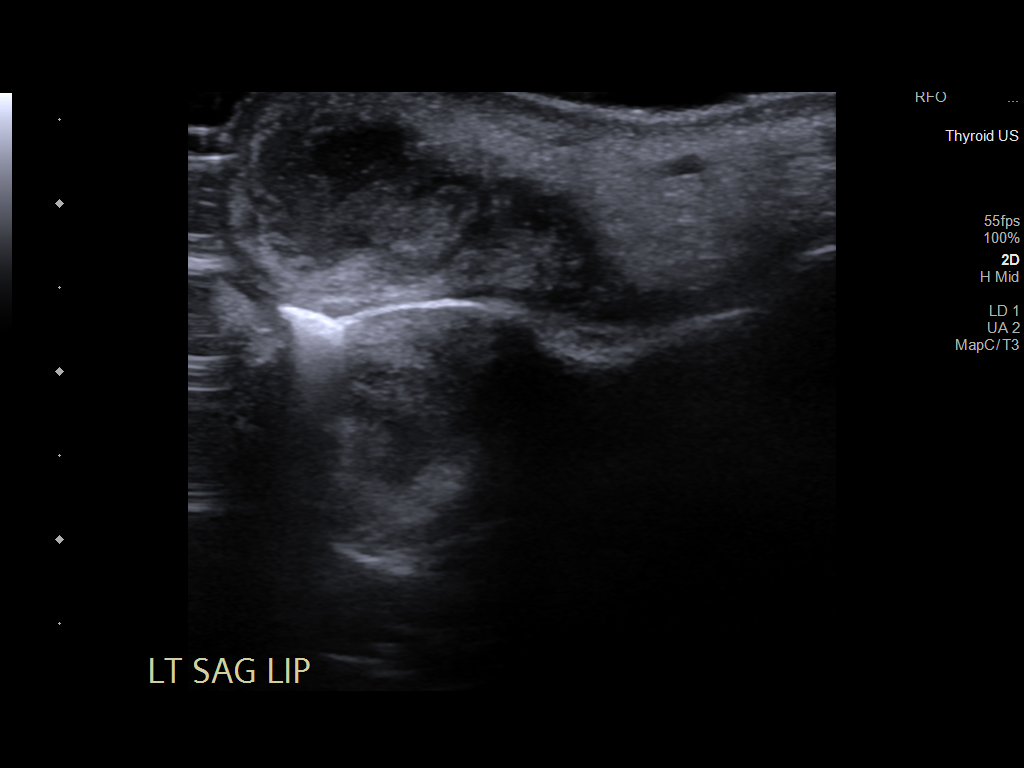
[im 4/7]
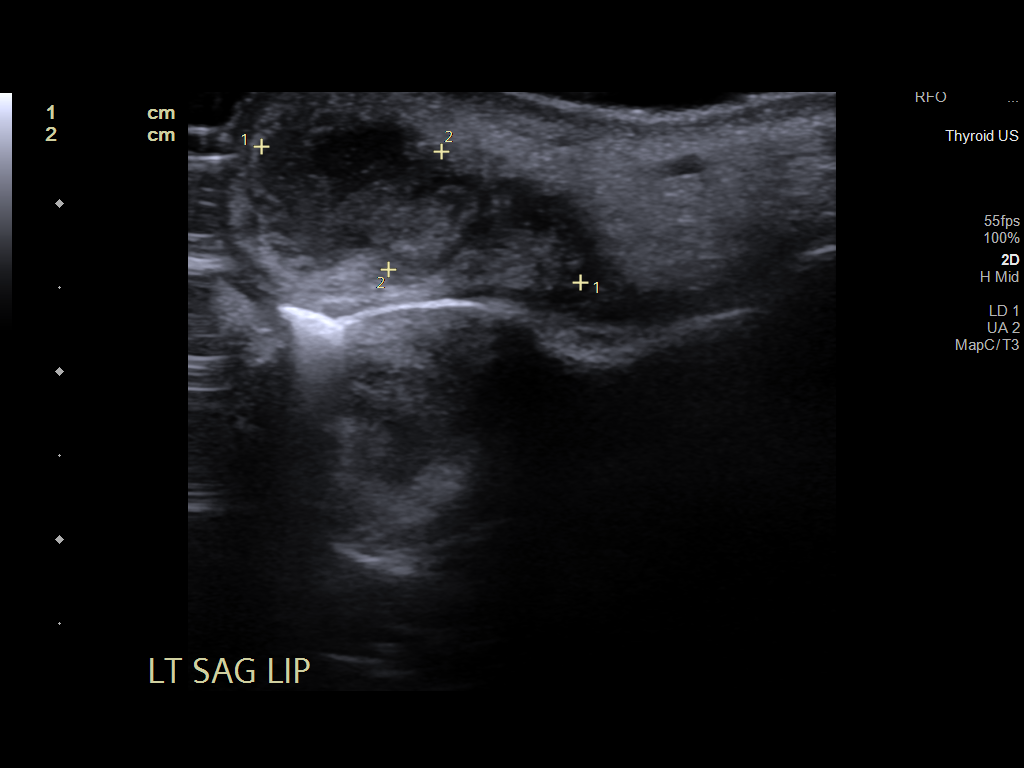
[im 5/7]
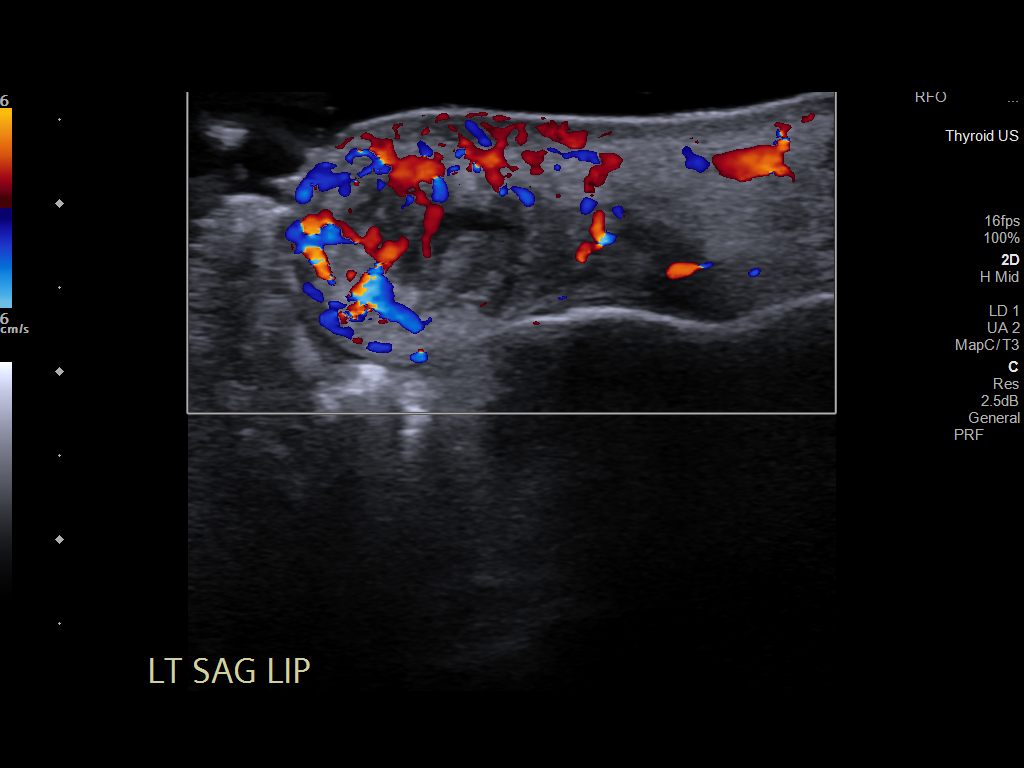
[im 6/7]
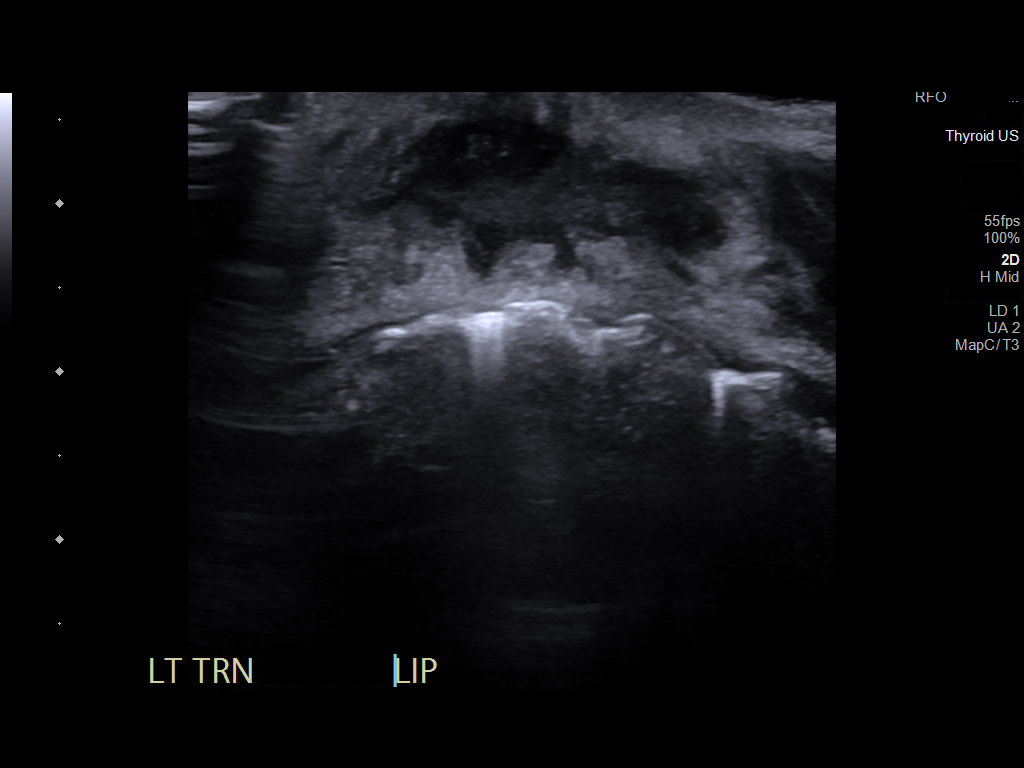
[im 7/7]
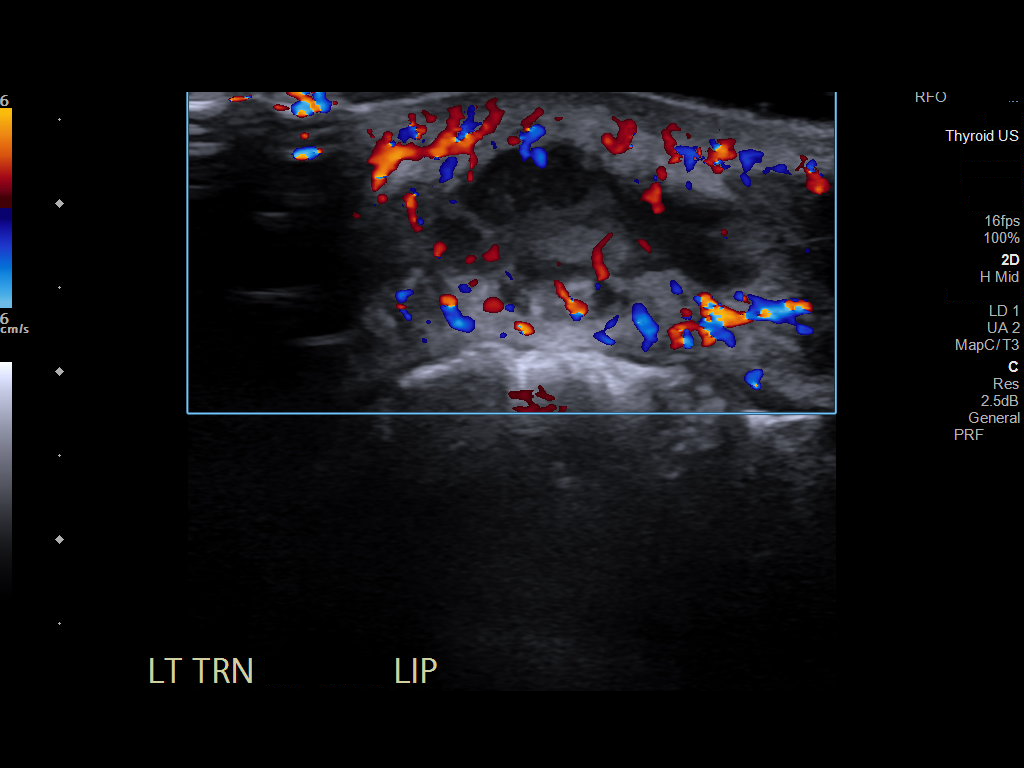

[7 of 7 positions shown; findings below may reference images not displayed]

FINDINGS: Grayscale and brief color Doppler imaging of the lower lip
demonstrate a 21 x 8 x 17 mm area of complex decreased echogenicity
(image 5) with surrounding hypervascularity. No organized fluid
collection.
IMPRESSION: Phlegmon of the lower lip in an area of 2.1 cm. No drainable fluid
suspected at this time.

## 2022-09-14 ENCOUNTER — Ambulatory Visit
Admission: RE | Admit: 2022-09-14 | Discharge: 2022-09-14 | Disposition: A | Discharge status: Home / Self Care | Payer: Medicare HMO | Source: Ambulatory Visit | Attending: Nurse Practitioner | Admitting: Nurse Practitioner

## 2022-09-14 ENCOUNTER — Other Ambulatory Visit: Payer: Self-pay

## 2022-09-14 VITALS — BP 113/80 | HR 74 | Temp 98.1°F | Resp 20

## 2022-09-14 DIAGNOSIS — U071 COVID-19: Secondary | ICD-10-CM | POA: Insufficient documentation

## 2022-09-14 DIAGNOSIS — R399 Unspecified symptoms and signs involving the genitourinary system: Secondary | ICD-10-CM | POA: Diagnosis not present

## 2022-09-14 LAB — POCT URINALYSIS DIP (MANUAL ENTRY)
Bilirubin, UA: NEGATIVE
Glucose, UA: NEGATIVE mg/dL
Ketones, POC UA: NEGATIVE mg/dL
Nitrite, UA: NEGATIVE
Protein Ur, POC: NEGATIVE mg/dL
Spec Grav, UA: 1.02 (ref 1.010–1.025)
Urobilinogen, UA: 0.2 E.U./dL
pH, UA: 6 (ref 5.0–8.0)

## 2022-09-14 MED ORDER — BENZONATATE 200 MG PO CAPS: 200.0000 mg | ORAL_CAPSULE | Freq: Three times a day (TID) | ORAL | 0 refills | Status: DC | PRN

## 2022-09-14 MED ORDER — SULFAMETHOXAZOLE-TRIMETHOPRIM 800-160 MG PO TABS: 1.0000 | ORAL_TABLET | Freq: Two times a day (BID) | ORAL | 0 refills | Status: AC
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
## 2022-09-14 NOTE — Discharge Instructions (Signed)
Take medication as prescribed. Increase fluids and allow for plenty of rest. Continue Paxlovid that you are currently taking. Recommend using a humidifier in your bedroom at nighttime during sleep and sleeping elevated on pillows while cough symptoms persist. As discussed, recommend keeping cough drops or throat lozenges with you along with water while cough is persistent. If you develop wheezing, shortness of breath, difficulty breathing, or become unable to speak in a complete sentence, please follow-up in the emergency department. If the cough does not improve, recommend following up in this clinic or with your primary care physician for further evaluation.  For suspected UTI: The urinalysis is positive for leukocytes and blood which may indicate a tract infection.  As discussed, a urine culture has been.  If the results of the culture are negative, you will be contacted and asked to stop the antibiotic. Try to drink at least 8-10 8 ounce glasses of water while symptoms persist. May take over-the-counter Tylenol as needed for pain, fever, or general discomfort. If you are sexually active, you should urinate approximately 15 to 20 minutes after sexual intercourse. If you take the medication, and symptoms worsen or fail to improve, would do symptoms of fever, chills, low back pain, kidney pain, vomiting, nausea, vomiting, or diarrhea, please follow-up in the emergency department immediately for further evaluation. Follow-up as needed.

## 2022-09-14 NOTE — ED Triage Notes (Signed)
Pt reports was tested positive for covid on Monday and reports was started on paxlovid and reports is on day 3 of this medication. Pt reports continued fatigue, cough, diarrhea. Pt reports is unsure if related to possible dehydration but reports now "it burns when I pee".

## 2022-09-16 LAB — URINE CULTURE: Culture: 60000 — AB

## 2022-09-18 NOTE — Telephone Encounter (Signed)
Opened in error

## 2022-10-02 DIAGNOSIS — Z1231 Encounter for screening mammogram for malignant neoplasm of breast: Secondary | ICD-10-CM | POA: Diagnosis not present

## 2022-10-03 DIAGNOSIS — R34 Anuria and oliguria: Secondary | ICD-10-CM | POA: Diagnosis not present

## 2022-10-03 DIAGNOSIS — R109 Unspecified abdominal pain: Secondary | ICD-10-CM | POA: Diagnosis not present

## 2022-10-03 DIAGNOSIS — R319 Hematuria, unspecified: Secondary | ICD-10-CM | POA: Diagnosis not present

## 2022-10-03 DIAGNOSIS — Z87442 Personal history of urinary calculi: Secondary | ICD-10-CM | POA: Diagnosis not present

## 2022-10-10 DIAGNOSIS — Z0100 Encounter for examination of eyes and vision without abnormal findings: Secondary | ICD-10-CM | POA: Diagnosis not present

## 2022-10-10 DIAGNOSIS — H524 Presbyopia: Secondary | ICD-10-CM | POA: Diagnosis not present

## 2022-10-18 DIAGNOSIS — F419 Anxiety disorder, unspecified: Secondary | ICD-10-CM | POA: Diagnosis not present

## 2022-10-18 DIAGNOSIS — F331 Major depressive disorder, recurrent, moderate: Secondary | ICD-10-CM | POA: Diagnosis not present

## 2022-10-18 DIAGNOSIS — F429 Obsessive-compulsive disorder, unspecified: Secondary | ICD-10-CM | POA: Diagnosis not present

## 2022-10-18 DIAGNOSIS — Z1330 Encounter for screening examination for mental health and behavioral disorders, unspecified: Secondary | ICD-10-CM | POA: Diagnosis not present

## 2022-10-18 DIAGNOSIS — F900 Attention-deficit hyperactivity disorder, predominantly inattentive type: Secondary | ICD-10-CM | POA: Diagnosis not present

## 2022-10-18 DIAGNOSIS — Z1331 Encounter for screening for depression: Secondary | ICD-10-CM | POA: Diagnosis not present

## 2022-10-31 DIAGNOSIS — E119 Type 2 diabetes mellitus without complications: Secondary | ICD-10-CM | POA: Diagnosis not present

## 2022-10-31 DIAGNOSIS — G43909 Migraine, unspecified, not intractable, without status migrainosus: Secondary | ICD-10-CM | POA: Diagnosis not present

## 2022-10-31 DIAGNOSIS — Z1331 Encounter for screening for depression: Secondary | ICD-10-CM | POA: Diagnosis not present

## 2022-10-31 DIAGNOSIS — Z0000 Encounter for general adult medical examination without abnormal findings: Secondary | ICD-10-CM | POA: Diagnosis not present

## 2022-10-31 DIAGNOSIS — F3342 Major depressive disorder, recurrent, in full remission: Secondary | ICD-10-CM | POA: Diagnosis not present

## 2022-10-31 DIAGNOSIS — E785 Hyperlipidemia, unspecified: Secondary | ICD-10-CM | POA: Diagnosis not present

## 2022-11-26 DIAGNOSIS — G43719 Chronic migraine without aura, intractable, without status migrainosus: Secondary | ICD-10-CM | POA: Diagnosis not present

## 2022-12-12 ENCOUNTER — Encounter: Payer: Self-pay | Admitting: Oncology

## 2022-12-17 DIAGNOSIS — F900 Attention-deficit hyperactivity disorder, predominantly inattentive type: Secondary | ICD-10-CM | POA: Diagnosis not present

## 2022-12-17 DIAGNOSIS — Z1330 Encounter for screening examination for mental health and behavioral disorders, unspecified: Secondary | ICD-10-CM | POA: Diagnosis not present

## 2022-12-17 DIAGNOSIS — Z1331 Encounter for screening for depression: Secondary | ICD-10-CM | POA: Diagnosis not present

## 2022-12-20 ENCOUNTER — Encounter: Payer: Self-pay | Admitting: Oncology

## 2022-12-20 ENCOUNTER — Ambulatory Visit
Admission: RE | Admit: 2022-12-20 | Discharge: 2022-12-20 | Disposition: A | Discharge status: Home / Self Care | Payer: Medicare HMO | Source: Ambulatory Visit

## 2022-12-20 VITALS — BP 123/82 | HR 98 | Temp 98.2°F | Resp 18

## 2022-12-20 DIAGNOSIS — S60551A Superficial foreign body of right hand, initial encounter: Secondary | ICD-10-CM | POA: Diagnosis not present

## 2022-12-20 DIAGNOSIS — Z23 Encounter for immunization: Secondary | ICD-10-CM | POA: Diagnosis not present

## 2022-12-20 DIAGNOSIS — M79641 Pain in right hand: Secondary | ICD-10-CM

## 2022-12-20 MED ORDER — TETANUS-DIPHTH-ACELL PERTUSSIS 5-2.5-18.5 LF-MCG/0.5 IM SUSY
0.5000 mL | PREFILLED_SYRINGE | Freq: Once | INTRAMUSCULAR | Status: AC
  Administered 2022-12-20: 0.5 mL via INTRAMUSCULAR

## 2022-12-20 MED ORDER — MUPIROCIN 2 % EX OINT: 1.0000 | TOPICAL_OINTMENT | Freq: Two times a day (BID) | CUTANEOUS | 0 refills | Status: DC

## 2022-12-20 MED ORDER — DOXYCYCLINE HYCLATE 100 MG PO CAPS: 100.0000 mg | ORAL_CAPSULE | Freq: Two times a day (BID) | ORAL | 0 refills | Status: DC

## 2022-12-20 NOTE — Discharge Instructions (Signed)
Clean the area with soap and water or Hibiclens solution twice daily and apply the mupirocin ointment and a nonstick dressing.  Elevate the hand at rest and ice off-and-on to help with swelling.  Take the full course of antibiotics and follow-up for worsening symptoms
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
## 2022-12-20 NOTE — ED Triage Notes (Signed)
Pt reports she has a deep splinter in her right hand that is painful x 1 day

## 2023-04-24 ENCOUNTER — Other Ambulatory Visit: Payer: Self-pay | Admitting: Medical Genetics

## 2023-04-24 DIAGNOSIS — Z006 Encounter for examination for normal comparison and control in clinical research program: Secondary | ICD-10-CM

## 2023-05-17 ENCOUNTER — Encounter: Payer: Self-pay | Admitting: Oncology

## 2023-05-24 ENCOUNTER — Other Ambulatory Visit (HOSPITAL_COMMUNITY)
Admission: RE | Admit: 2023-05-24 | Discharge: 2023-05-24 | Disposition: A | Discharge status: Home / Self Care | Payer: Self-pay | Source: Ambulatory Visit | Attending: Medical Genetics | Admitting: Medical Genetics

## 2023-05-24 DIAGNOSIS — Z006 Encounter for examination for normal comparison and control in clinical research program: Secondary | ICD-10-CM | POA: Insufficient documentation

## 2023-05-26 ENCOUNTER — Ambulatory Visit
Admission: RE | Admit: 2023-05-26 | Discharge: 2023-05-26 | Disposition: A | Discharge status: Home / Self Care | Payer: Medicare Other | Source: Ambulatory Visit | Attending: Nurse Practitioner | Admitting: Nurse Practitioner

## 2023-05-26 VITALS — BP 133/69 | HR 68 | Temp 98.6°F | Resp 16

## 2023-05-26 DIAGNOSIS — J019 Acute sinusitis, unspecified: Secondary | ICD-10-CM | POA: Insufficient documentation

## 2023-05-26 DIAGNOSIS — Z79899 Other long term (current) drug therapy: Secondary | ICD-10-CM | POA: Insufficient documentation

## 2023-05-26 DIAGNOSIS — Z1152 Encounter for screening for COVID-19: Secondary | ICD-10-CM | POA: Diagnosis not present

## 2023-05-26 DIAGNOSIS — J329 Chronic sinusitis, unspecified: Secondary | ICD-10-CM

## 2023-05-26 DIAGNOSIS — R519 Headache, unspecified: Secondary | ICD-10-CM | POA: Diagnosis present

## 2023-05-26 DIAGNOSIS — Z8669 Personal history of other diseases of the nervous system and sense organs: Secondary | ICD-10-CM | POA: Diagnosis not present

## 2023-05-26 DIAGNOSIS — B9789 Other viral agents as the cause of diseases classified elsewhere: Secondary | ICD-10-CM | POA: Diagnosis not present

## 2023-05-26 DIAGNOSIS — J029 Acute pharyngitis, unspecified: Secondary | ICD-10-CM | POA: Diagnosis present

## 2023-05-26 LAB — POCT RAPID STREP A (OFFICE): Rapid Strep A Screen: NEGATIVE

## 2023-05-26 MED ORDER — IPRATROPIUM BROMIDE 0.03 % NA SOLN: 2.0000 | Freq: Two times a day (BID) | NASAL | 12 refills | Status: DC

## 2023-05-26 MED ORDER — PSEUDOEPHEDRINE HCL 30 MG PO TABS: 30.0000 mg | ORAL_TABLET | Freq: Three times a day (TID) | ORAL | 0 refills | Status: DC | PRN

## 2023-05-26 MED ORDER — DEXAMETHASONE SODIUM PHOSPHATE 10 MG/ML IJ SOLN
10.0000 mg | INTRAMUSCULAR | Status: AC
  Administered 2023-05-26: 10 mg via INTRAMUSCULAR

## 2023-05-26 MED ORDER — KETOROLAC TROMETHAMINE 30 MG/ML IJ SOLN
30.0000 mg | Freq: Once | INTRAMUSCULAR | Status: AC
  Administered 2023-05-26: 30 mg via INTRAMUSCULAR
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
## 2023-05-26 NOTE — ED Triage Notes (Signed)
Pt reports sore throat x 3 days, headache pt states she has maxed out on rescue migraine meds. Pain is present behind ears back of the head and in the neck.

## 2023-05-26 NOTE — Discharge Instructions (Signed)
COVID test is pending, along with a throat culture.  Results will be available on MyChart, you will be contacted if the pending test results are abnormal. You have been given injections of Decadron 10 mg and Toradol 30 mg.  Do not take any additional ibuprofen, Advil, Aleve, or naproxen today.  You may take over-the-counter Tylenol as needed for breakthrough pain. Take medication as prescribed.  As discussed, if you develop nosebleeds with the nasal spray, stop the medication immediately. Increase fluids and allow for plenty of rest. Normal saline nasal spray throughout the day for nasal congestion. Continue your current migraine headache medication. If symptoms do not improve with this treatment, please follow-up with your primary care physician for further evaluation. Follow-up as needed.

## 2023-05-27 LAB — SARS CORONAVIRUS 2 (TAT 6-24 HRS): SARS Coronavirus 2: NEGATIVE

## 2023-05-29 LAB — CULTURE, GROUP A STREP (THRC): Special Requests: NORMAL

## 2023-06-03 LAB — GENECONNECT MOLECULAR SCREEN: Genetic Analysis Overall Interpretation: NEGATIVE

## 2023-07-23 ENCOUNTER — Encounter: Payer: Self-pay | Admitting: Oncology

## 2023-07-29 ENCOUNTER — Other Ambulatory Visit (INDEPENDENT_AMBULATORY_CARE_PROVIDER_SITE_OTHER): Payer: Self-pay | Admitting: Podiatry

## 2023-07-29 ENCOUNTER — Other Ambulatory Visit: Payer: Self-pay | Admitting: Podiatry

## 2023-07-29 DIAGNOSIS — I739 Peripheral vascular disease, unspecified: Secondary | ICD-10-CM

## 2023-07-30 ENCOUNTER — Ambulatory Visit (INDEPENDENT_AMBULATORY_CARE_PROVIDER_SITE_OTHER): Payer: Medicare Other

## 2023-07-30 DIAGNOSIS — I739 Peripheral vascular disease, unspecified: Secondary | ICD-10-CM

## 2023-08-06 ENCOUNTER — Encounter: Payer: Self-pay | Admitting: Urgent Care

## 2023-08-06 ENCOUNTER — Encounter
Admission: RE | Admit: 2023-08-06 | Discharge: 2023-08-06 | Disposition: A | Discharge status: Home / Self Care | Payer: Medicare Other | Source: Ambulatory Visit | Attending: Podiatry | Admitting: Podiatry

## 2023-08-06 ENCOUNTER — Other Ambulatory Visit: Payer: Self-pay

## 2023-08-06 DIAGNOSIS — Z01812 Encounter for preprocedural laboratory examination: Secondary | ICD-10-CM

## 2023-08-06 HISTORY — DX: Personal history of urinary calculi: Z87.442

## 2023-08-06 HISTORY — DX: Encounter for other specified aftercare: Z51.89

## 2023-08-06 HISTORY — DX: Prediabetes: R73.03

## 2023-08-06 HISTORY — DX: Headache, unspecified: R51.9

## 2023-08-06 HISTORY — DX: Acute embolism and thrombosis of unspecified deep veins of unspecified lower extremity: I82.409

## 2023-08-06 HISTORY — DX: Iron deficiency anemia, unspecified: D50.9

## 2023-08-06 HISTORY — DX: Attention-deficit hyperactivity disorder, unspecified type: F90.9

## 2023-08-06 HISTORY — DX: Hematuria, unspecified: R31.9

## 2023-08-06 NOTE — Patient Instructions (Addendum)
 Your procedure is scheduled on: 08/15/23 - Thursday Report to the Registration Desk on the 1st floor of the [facility]. To find out your arrival time, please call [phone] between 1PM - 3PM on: 08/14/23 - Wednesday If your arrival time is 6:00 am, do not arrive before that time as the [facility] entrance doors do not open until 6:00 am.  REMEMBER: Instructions that are not followed completely may result in serious medical risk, up to and including death; or upon the discretion of your surgeon and anesthesiologist your surgery may need to be rescheduled.  Do not eat food after midnight the night before surgery.  No gum chewing or hard candies.  You may however, drink CLEAR liquids up to 2 hours before you are scheduled to arrive for your surgery. Do not drink anything within 2 hours of your scheduled arrival time.  Clear liquids include: - water  - apple juice without pulp - gatorade (not RED colors) - black coffee or tea (Do NOT add milk or creamers to the coffee or tea) Do NOT drink anything that is not on this list.  In addition, your doctor has ordered for you to drink the provided:  Ensure Pre-Surgery Clear Carbohydrate Drink  Drinking this carbohydrate drink up to two hours before surgery helps to reduce insulin resistance and improve patient outcomes. Please complete drinking 2 hours before scheduled arrival time.  One week prior to surgery: Stop Anti-inflammatories (NSAIDS) such as Advil, Aleve, Ibuprofen, Motrin, Naproxen, Naprosyn and Aspirin based products such as Excedrin, Goody's Powder, BC Powder. You may take Tylenol if needed for pain up until the day of surgery.  Stop ANY OVER THE COUNTER supplements until after surgery : Multiple Vitamin   ON THE DAY OF SURGERY ONLY TAKE THESE MEDICATIONS WITH SIPS OF WATER:  buPROPion (WELLBUTRIN)  clonazePAM (KLONOPIN)  propranolol (INDERAL)  atomoxetine (STRATTERA)    No Alcohol for 24 hours before or after  surgery.  No Smoking including e-cigarettes for 24 hours before surgery.  No chewable tobacco products for at least 6 hours before surgery.  No nicotine patches on the day of surgery.  Do not use any "recreational" drugs for at least a week (preferably 2 weeks) before your surgery.  Please be advised that the combination of cocaine and anesthesia may have negative outcomes, up to and including death. If you test positive for cocaine, your surgery will be cancelled.  On the morning of surgery brush your teeth with toothpaste and water, you may rinse your mouth with mouthwash if you wish. Do not swallow any toothpaste or mouthwash.  Do not wear jewelry, make-up, hairpins, clips or nail polish.  For welded (permanent) jewelry: bracelets, anklets, waist bands, etc.  Please have this removed prior to surgery.  If it is not removed, there is a chance that hospital personnel will need to cut it off on the day of surgery.  Do not wear lotions, powders, or perfumes.   Do not shave body hair from the neck down 48 hours before surgery.  Contact lenses, hearing aids and dentures may not be worn into surgery.  Do not bring valuables to the hospital. [facility] is not responsible for any missing/lost belongings or valuables.   Notify your doctor if there is any change in your medical condition (cold, fever, infection).  Wear comfortable clothing (specific to your surgery type) to the hospital.  After surgery, you can help prevent lung complications by doing breathing exercises.  Take deep breaths and  cough every 1-2 hours. Your doctor may order a device called an Incentive Spirometer to help you take deep breaths. When coughing or sneezing, hold a pillow firmly against your incision with both hands. This is called "splinting." Doing this helps protect your incision. It also decreases belly discomfort.  If you are being admitted to the hospital overnight, leave your suitcase in the car. After  surgery it may be brought to your room.  In case of increased patient census, it may be necessary for you, the patient, to continue your postoperative care in the Same Day Surgery department.  If you are being discharged the day of surgery, you will not be allowed to drive home. You will need a responsible individual to drive you home and stay with you for 24 hours after surgery.   If you are taking public transportation, you will need to have a responsible individual with you.  Please call the Pre-admissions Testing Dept. at [phone] if you have any questions about these instructions.  Surgery Visitation Policy:  Patients having surgery or a procedure may have two visitors.  Children under the age of 54 must have an adult with them who is not the patient.  Temporary Visitor Restrictions Due to increasing cases of flu, RSV and COVID-19: Children ages 54 and under will not be able to visit patients in [facility] hospitals under most circumstances.  Inpatient Visitation:    Visiting hours are 7 a.m. to 8 p.m. Up to four visitors are allowed at one time in a patient room. The visitors may rotate out with other people during the day.  One visitor age 34 or older may stay with the patient overnight and must be in the room by 8 p.m.

## 2023-08-06 NOTE — Progress Notes (Signed)
  Perioperative Services Pre-Admission/Anesthesia Testing   Date: 08/06/23 Name: [name] MRN:   [id number]  Re: Consideration of preoperative prophylactic antibiotic change   Request sent to: Rosetta Posner, DPM (routed and/or faxed via [facility])  Planned Surgical Procedure(s):    Case: [id number] Date/Time: 08/15/23 0925   Procedure: HAMMER TOE CORRECTION RIGHT FOURTH & LEFT FIFTH (Bilateral: Toe)   Anesthesia type: Choice   Pre-op diagnosis:      M20.41, M20.42 - Hammertoes of both feet     M79.671, M79.672 - Pain in both feet   Location: [facility] OR ROOM 09 / [facility]   Surgeons: Rosetta Posner, DPM      Clinical Notes:  Patient has a documented allergy/intolerance to cephalosporins  Advising that CEPHALEXIN has caused her to experience nausea and vomiting in the past.   EMR review indicated that patient received PCN and/or cephalosporin in the past as follows: CEFAZOLIN received on MULTIPLE OCCASIONS with no documented ADRs. Allergy section in EMR updated to reflect tolerance.    Screened as appropriate for cephalosporin use during medication reconciliation No immediate angioedema, dysphagia, SOB, anaphylaxis symptoms. No severe rash involving mucous membranes or skin necrosis. No hospital admissions related to side effects of PCN/cephalosporin use.  No documented reaction to PCN or cephalosporin in the last 10 years.  Request:  As an evidence based approach to reducing the rate of incidence for post-operative SSI and the development of MDROs, could an agent that allows for narrower antimicrobial coverage for preoperative prophylaxis in this patient's upcoming surgical course be considered?   Currently ordered preoperative prophylactic ABX:  vancomycin + levofloxacin .   Specifically requesting change to cephalosporin (CEFAZOLIN).   Drug of choice for many procedures; it is the most widely studied antimicrobial agent with proven efficacy for  antimicrobial prophylaxis.   Desirable duration of action, spectrum of activity against organisms commonly encountered in surgery, and it has an excellent safety profile and low cost.   Active against streptococci, methicillin-susceptible staphylococci, and many gram-negative organisms.  Despite being a first-generation cephalosporin, cefazolin is structurally different than other cephalosporins. Cefazolin has two side chains (R1 and R2) that have structures that do not match those of any other FDA-approved ?-lactams or PCN.  True allergies to cefazolin are extremely rare (<1%) and are usually specific for its side chains, not the shared core ring.  No documented history of true IgE mediated reaction in this patient.   Please communicate decision with me and I will change the orders in Epic as per your direction.   Quentin Mulling, MSN, APRN, FNP-C, CEN [facility]  Perioperative Services Nurse Practitioner Phone: [phone] Fax: [phone] 08/06/23 2:03 PM  NOTE: This note has been prepared using Dragon dictation software. Despite my best ability to proofread, there is always the potential that unintentional transcriptional errors may still occur from this process.

## 2023-08-14 MED ORDER — LEVOFLOXACIN IN D5W 500 MG/100ML IV SOLN: 500.0000 mg | INTRAVENOUS | Status: DC

## 2023-08-14 MED ORDER — VANCOMYCIN HCL IN DEXTROSE 1-5 GM/200ML-% IV SOLN: 1000.0000 mg | INTRAVENOUS | Status: DC

## 2023-08-14 MED ORDER — SODIUM CHLORIDE 0.9% FLUSH: 3.0000 mL | INTRAVENOUS | Status: DC | PRN

## 2023-08-14 MED ORDER — SODIUM CHLORIDE 0.9% FLUSH: 3.0000 mL | Freq: Two times a day (BID) | INTRAVENOUS | Status: DC

## 2023-08-15 ENCOUNTER — Ambulatory Visit: Admission: RE | Admit: 2023-08-15 | Payer: Medicare Other | Source: Home / Self Care | Admitting: Podiatry

## 2023-08-15 ENCOUNTER — Encounter: Admission: RE | Payer: Self-pay | Source: Home / Self Care

## 2023-08-15 SURGERY — CORRECTION, HAMMER TOE
Anesthesia: Choice | Site: Toe | Laterality: Bilateral

## 2023-08-16 ENCOUNTER — Encounter: Payer: Self-pay | Admitting: Podiatry

## 2023-08-16 ENCOUNTER — Other Ambulatory Visit: Payer: Self-pay | Admitting: Podiatry

## 2023-08-16 NOTE — Discharge Instructions (Addendum)
 [facility]  POST OPERATIVE INSTRUCTIONS FOR DR. Ether Griffins AND DR. Crystale Giannattasio [facility]IN[facility] PODIATRY DEPARTMENT   Take your medication as prescribed.  Pain medication should be taken only as needed.  If pain is mild/moderate, may take ibuprofen/tylenol alone.  If severe may take that but also pain medication as needed.  Discontinue smoking.  Smoking can lead to slow wound healing and bone healing and can lead to dehiscence and numerous complications.  This was discussed preop.  Continue to discontinue until at least the incision heals in 2 weeks but would recommend stopping all together for your overall health.  Keep the dressing clean, dry and intact.  Keep your foot elevated above the heart level when at rest to improve swelling.  May apply ice to the top of the foot for max 10 minutes out of every 1 hour as needed  Walking to the bathroom and brief periods of walking are acceptable, unless we have instructed you to be non-weight bearing.  Always wear your post-op shoe when walking.  Always use your crutches if you are to be non-weight bearing.  Do not take a shower. Baths are permissible as long as the foot is kept out of the water.   Every hour you are awake:  Bend your knee 15 times. Flex foot 15 times Massage calf 15 times  Call [facility] [phone]) if any of the following problems occur: You develop a temperature or fever. The bandage becomes saturated with blood. Medication does not stop your pain. Injury of the foot occurs. Any symptoms of infection including redness, odor, or red streaks running from wound.

## 2023-08-20 NOTE — Anesthesia Preprocedure Evaluation (Signed)
 Anesthesia Evaluation  Patient identified by MRN, date of birth, ID band Patient awake    Reviewed: Allergy & Precautions, NPO status , Patient's Chart, lab work & pertinent test results  History of Anesthesia Complications (+) PONV and history of anesthetic complications  Airway Mallampati: I   Neck ROM: Full    Dental no notable dental hx.    Pulmonary Current Smoker (3-4 cigarettes per day) and Patient abstained from smoking.   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   Endo/Other  PCOS  Renal/GU Renal disease (nephrolithiasis)     Musculoskeletal  (+) Arthritis ,    Abdominal   Peds  Hematology  (+) Blood dyscrasia, anemia   Anesthesia Other Findings   Reproductive/Obstetrics                             Anesthesia Physical Anesthesia Plan  ASA: 2  Anesthesia Plan: General   Post-op Pain Management:    Induction: Intravenous  PONV Risk Score and Plan: 3 and Ondansetron, Dexamethasone, Treatment may vary due to age or medical condition and Scopolamine patch - Pre-op  Airway Management Planned: Oral ETT  Additional Equipment:   Intra-op Plan:   Post-operative Plan: Extubation in OR  Informed Consent: I have reviewed the patients History and Physical, chart, labs and discussed the procedure including the risks, benefits and alternatives for the proposed anesthesia with the patient or authorized representative who has indicated his/her understanding and acceptance.     Dental advisory given  Plan Discussed with: CRNA  Anesthesia Plan Comments: (Patient consented for risks of anesthesia including but not limited to:  - adverse  reactions to medications - damage to eyes, teeth, lips or other oral mucosa - nerve damage due to positioning  - sore throat or hoarseness - damage to heart, brain, nerves, lungs, other parts of body or loss of life  Informed patient about role of CRNA in peri- and intra-operative care.  Patient voiced understanding.)       Anesthesia Quick Evaluation

## 2023-08-22 ENCOUNTER — Other Ambulatory Visit: Payer: Self-pay

## 2023-08-22 ENCOUNTER — Ambulatory Visit: Payer: Self-pay

## 2023-08-22 ENCOUNTER — Encounter
Admission: RE | Disposition: A | Discharge status: Home / Self Care | Payer: Self-pay | Source: Home / Self Care | Attending: Podiatry

## 2023-08-22 ENCOUNTER — Ambulatory Visit: Payer: Self-pay | Admitting: Anesthesiology

## 2023-08-22 ENCOUNTER — Ambulatory Visit
Admission: RE | Admit: 2023-08-22 | Discharge: 2023-08-22 | Disposition: A | Discharge status: Home / Self Care | Attending: Podiatry | Admitting: Podiatry

## 2023-08-22 ENCOUNTER — Encounter: Payer: Self-pay | Admitting: Podiatry

## 2023-08-22 DIAGNOSIS — M2042 Other hammer toe(s) (acquired), left foot: Secondary | ICD-10-CM | POA: Diagnosis present

## 2023-08-22 DIAGNOSIS — Z86718 Personal history of other venous thrombosis and embolism: Secondary | ICD-10-CM | POA: Insufficient documentation

## 2023-08-22 DIAGNOSIS — D649 Anemia, unspecified: Secondary | ICD-10-CM | POA: Diagnosis not present

## 2023-08-22 DIAGNOSIS — F419 Anxiety disorder, unspecified: Secondary | ICD-10-CM | POA: Diagnosis not present

## 2023-08-22 DIAGNOSIS — M2041 Other hammer toe(s) (acquired), right foot: Secondary | ICD-10-CM | POA: Insufficient documentation

## 2023-08-22 DIAGNOSIS — F909 Attention-deficit hyperactivity disorder, unspecified type: Secondary | ICD-10-CM | POA: Insufficient documentation

## 2023-08-22 DIAGNOSIS — M79671 Pain in right foot: Secondary | ICD-10-CM | POA: Diagnosis present

## 2023-08-22 DIAGNOSIS — M1990 Unspecified osteoarthritis, unspecified site: Secondary | ICD-10-CM | POA: Diagnosis not present

## 2023-08-22 DIAGNOSIS — F32A Depression, unspecified: Secondary | ICD-10-CM | POA: Insufficient documentation

## 2023-08-22 DIAGNOSIS — Z9884 Bariatric surgery status: Secondary | ICD-10-CM | POA: Diagnosis not present

## 2023-08-22 DIAGNOSIS — F17210 Nicotine dependence, cigarettes, uncomplicated: Secondary | ICD-10-CM | POA: Diagnosis not present

## 2023-08-22 DIAGNOSIS — R519 Headache, unspecified: Secondary | ICD-10-CM | POA: Insufficient documentation

## 2023-08-22 LAB — POCT PREGNANCY, URINE: Preg Test, Ur: NEGATIVE

## 2023-08-22 SURGERY — CORRECTION, HAMMER TOE
Anesthesia: General | Site: Toe | Laterality: Bilateral

## 2023-08-22 MED ORDER — ONDANSETRON HCL 4 MG/2ML IJ SOLN
INTRAMUSCULAR | Status: DC | PRN
  Administered 2023-08-22: 4 mg via INTRAVENOUS

## 2023-08-22 MED ORDER — MIDAZOLAM HCL 5 MG/5ML IJ SOLN
INTRAMUSCULAR | Status: DC | PRN
  Administered 2023-08-22: 2 mg via INTRAVENOUS

## 2023-08-22 MED ORDER — BUPIVACAINE LIPOSOME 1.3 % IJ SUSP
INTRAMUSCULAR | Status: DC | PRN
  Administered 2023-08-22: 6 mL

## 2023-08-22 MED ORDER — EPHEDRINE SULFATE (PRESSORS) 50 MG/ML IJ SOLN
INTRAMUSCULAR | Status: DC | PRN
Start: 2023-08-22 — End: 2023-08-22
  Administered 2023-08-22: 5 mg via INTRAVENOUS
  Administered 2023-08-22: 10 mg via INTRAVENOUS
  Administered 2023-08-22 (×2): 5 mg via INTRAVENOUS

## 2023-08-22 MED ORDER — PROMETHAZINE HCL 12.5 MG PO TABS: 12.5000 mg | ORAL_TABLET | Freq: Four times a day (QID) | ORAL | 0 refills | Status: AC | PRN

## 2023-08-22 MED ORDER — VANCOMYCIN HCL IN DEXTROSE 1-5 GM/200ML-% IV SOLN
1000.0000 mg | INTRAVENOUS | Status: AC
  Administered 2023-08-22: 1000 mg via INTRAVENOUS

## 2023-08-22 MED ORDER — VANCOMYCIN HCL 500 MG IV SOLR
INTRAVENOUS | Status: AC
  Filled 2023-08-22: qty 20

## 2023-08-22 MED ORDER — BUPIVACAINE HCL (PF) 0.25 % IJ SOLN
INTRAMUSCULAR | Status: DC | PRN
  Administered 2023-08-22 (×2): 5 mL

## 2023-08-22 MED ORDER — VASOPRESSIN 20 UNIT/ML IV SOLN
INTRAVENOUS | Status: DC | PRN
Start: 2023-08-22 — End: 2023-08-22
  Administered 2023-08-22: 1 [IU] via INTRAVENOUS
  Administered 2023-08-22: 2 [IU] via INTRAVENOUS
  Administered 2023-08-22 (×2): 1 [IU] via INTRAVENOUS

## 2023-08-22 MED ORDER — SCOPOLAMINE 1 MG/3DAYS TD PT72
1.0000 | MEDICATED_PATCH | Freq: Once | TRANSDERMAL | Status: DC
  Administered 2023-08-22: 1.5 mg via TRANSDERMAL

## 2023-08-22 MED ORDER — DEXAMETHASONE SODIUM PHOSPHATE 4 MG/ML IJ SOLN
INTRAMUSCULAR | Status: AC
  Filled 2023-08-22: qty 1

## 2023-08-22 MED ORDER — LACTATED RINGERS IV SOLN: INTRAVENOUS | Status: DC | PRN

## 2023-08-22 MED ORDER — 0.9 % SODIUM CHLORIDE (POUR BTL) OPTIME
TOPICAL | Status: DC | PRN
  Administered 2023-08-22: 1000 mL

## 2023-08-22 MED ORDER — LEVOFLOXACIN IN D5W 500 MG/100ML IV SOLN
500.0000 mg | INTRAVENOUS | Status: DC
Start: 2023-08-22 — End: 2023-08-22

## 2023-08-22 MED ORDER — BUPIVACAINE LIPOSOME 1.3 % IJ SUSP
INTRAMUSCULAR | Status: AC
  Filled 2023-08-22: qty 10

## 2023-08-22 MED ORDER — HYDROCODONE-ACETAMINOPHEN 5-325 MG PO TABS: 1.0000 | ORAL_TABLET | Freq: Four times a day (QID) | ORAL | 0 refills | Status: AC | PRN

## 2023-08-22 MED ORDER — ONDANSETRON HCL 4 MG/2ML IJ SOLN
INTRAMUSCULAR | Status: AC
  Filled 2023-08-22: qty 2

## 2023-08-22 MED ORDER — PHENYLEPHRINE HCL (PRESSORS) 10 MG/ML IV SOLN
INTRAVENOUS | Status: DC | PRN
  Administered 2023-08-22: 160 ug via INTRAVENOUS
  Administered 2023-08-22: 80 ug via INTRAVENOUS
  Administered 2023-08-22: 120 ug via INTRAVENOUS
  Administered 2023-08-22: 80 ug via INTRAVENOUS

## 2023-08-22 MED ORDER — FENTANYL CITRATE (PF) 100 MCG/2ML IJ SOLN
INTRAMUSCULAR | Status: DC | PRN
  Administered 2023-08-22: 50 ug via INTRAVENOUS

## 2023-08-22 MED ORDER — ASPIRIN 81 MG PO TBEC: 81.0000 mg | DELAYED_RELEASE_TABLET | Freq: Two times a day (BID) | ORAL | 0 refills | Status: DC

## 2023-08-22 MED ORDER — DEXAMETHASONE SODIUM PHOSPHATE 4 MG/ML IJ SOLN
INTRAMUSCULAR | Status: DC | PRN
  Administered 2023-08-22: 4 mg via INTRAVENOUS

## 2023-08-22 MED ORDER — PROPOFOL 10 MG/ML IV BOLUS
INTRAVENOUS | Status: DC | PRN
  Administered 2023-08-22: 140 mg via INTRAVENOUS

## 2023-08-22 MED ORDER — VASOPRESSIN 20 UNIT/ML IV SOLN
INTRAVENOUS | Status: AC
  Filled 2023-08-22: qty 1

## 2023-08-22 MED ORDER — MIDAZOLAM HCL 2 MG/2ML IJ SOLN
INTRAMUSCULAR | Status: AC
  Filled 2023-08-22: qty 2

## 2023-08-22 MED ORDER — ENOXAPARIN SODIUM 40 MG/0.4ML IJ SOSY: 40.0000 mg | PREFILLED_SYRINGE | INTRAMUSCULAR | 0 refills | Status: DC

## 2023-08-22 MED ORDER — LIDOCAINE HCL (CARDIAC) PF 100 MG/5ML IV SOSY
PREFILLED_SYRINGE | INTRAVENOUS | Status: DC | PRN
  Administered 2023-08-22: 70 mg via INTRAVENOUS

## 2023-08-22 MED ORDER — PHENYLEPHRINE 80 MCG/ML (10ML) SYRINGE FOR IV PUSH (FOR BLOOD PRESSURE SUPPORT)
PREFILLED_SYRINGE | INTRAVENOUS | Status: AC
  Filled 2023-08-22: qty 10

## 2023-08-22 MED ORDER — LIDOCAINE HCL (PF) 2 % IJ SOLN
INTRAMUSCULAR | Status: AC
  Filled 2023-08-22: qty 5

## 2023-08-22 MED ORDER — FENTANYL CITRATE (PF) 100 MCG/2ML IJ SOLN
INTRAMUSCULAR | Status: AC
Start: 2023-08-22 — End: ?
  Filled 2023-08-22: qty 2

## 2023-08-22 MED ORDER — SCOPOLAMINE 1 MG/3DAYS TD PT72
MEDICATED_PATCH | TRANSDERMAL | Status: AC
  Filled 2023-08-22: qty 1

## 2023-08-22 MED ORDER — EPHEDRINE 5 MG/ML INJ
INTRAVENOUS | Status: AC
  Filled 2023-08-22: qty 5

## 2023-08-22 SURGICAL SUPPLY — 36 items
BLADE MED AGGRESSIVE (BLADE) IMPLANT
BLADE OSC/SAGITTAL MD 5.5X18 (BLADE) IMPLANT
BLADE SURG 15 STRL LF DISP TIS (BLADE) IMPLANT
BNDG COHESIVE 4X5 TAN STRL LF (GAUZE/BANDAGES/DRESSINGS) ×1 IMPLANT
BNDG ESMARCH 4X12 STRL LF (GAUZE/BANDAGES/DRESSINGS) ×1 IMPLANT
BNDG GAUZE DERMACEA FLUFF 4 (GAUZE/BANDAGES/DRESSINGS) ×1 IMPLANT
BNDG STRETCH 4X75 STRL LF (GAUZE/BANDAGES/DRESSINGS) ×1 IMPLANT
CANISTER SUCT 1200ML W/VALVE (MISCELLANEOUS) ×1 IMPLANT
COVER LIGHT HANDLE UNIVERSAL (MISCELLANEOUS) ×2 IMPLANT
CUFF TOURN SGL QUICK 18X4 (TOURNIQUET CUFF) IMPLANT
DRAPE FLUOR MINI C-ARM 54X84 (DRAPES) ×1 IMPLANT
DURAPREP 26ML APPLICATOR (WOUND CARE) ×1 IMPLANT
ELECT REM PT RETURN 9FT ADLT (ELECTROSURGICAL) ×1 IMPLANT
ELECTRODE REM PT RTRN 9FT ADLT (ELECTROSURGICAL) ×1 IMPLANT
GAUZE SPONGE 4X4 12PLY STRL (GAUZE/BANDAGES/DRESSINGS) ×1 IMPLANT
GAUZE STRETCH 2X75IN STRL (MISCELLANEOUS) IMPLANT
GAUZE XEROFORM 1X8 LF (GAUZE/BANDAGES/DRESSINGS) ×1 IMPLANT
GLOVE BIOGEL PI IND STRL 7.5 (GLOVE) ×1 IMPLANT
GLOVE SURG SS PI 7.0 STRL IVOR (GLOVE) ×1 IMPLANT
GOWN STRL REUS W/ TWL LRG LVL3 (GOWN DISPOSABLE) ×2 IMPLANT
K-WIRE DBL END TROCAR 6X.045 (WIRE) IMPLANT
K-WIRE DBL END TROCAR 6X.062 (WIRE) IMPLANT
KIT TURNOVER KIT A (KITS) ×1 IMPLANT
KWIRE DBL END TROCAR 6X.045 (WIRE) IMPLANT
KWIRE DBL END TROCAR 6X.062 (WIRE) IMPLANT
NS IRRIG 500ML POUR BTL (IV SOLUTION) ×1 IMPLANT
PACK EXTREMITY ARMC (MISCELLANEOUS) ×1 IMPLANT
PIN BALLS 3/8 F/.045 WIRE (MISCELLANEOUS) ×1 IMPLANT
RASP SM TEAR CROSS CUT (RASP) IMPLANT
STOCKINETTE IMPERVIOUS LG (DRAPES) ×1 IMPLANT
SUT ETHILON 4-0 FS2 18XMFL BLK (SUTURE) ×1 IMPLANT
SUT ETHILON 5-0 FS-2 18 BLK (SUTURE) IMPLANT
SUT MNCRL 5-0+ PC-1 (SUTURE) IMPLANT
SUT VIC AB 3-0 SH 27X BRD (SUTURE) IMPLANT
SUT VIC AB 4-0 FS2 27 (SUTURE) IMPLANT
SUTURE ETHLN 4-0 FS2 18XMF BLK (SUTURE) IMPLANT

## 2023-08-22 NOTE — Transfer of Care (Signed)
 Immediate Anesthesia Transfer of Care Note  Patient: [name]  Procedure(s) Performed: CORRECTION, HAMMER TOE (Bilateral: Toe)  Patient Location: PACU  Anesthesia Type: General  Level of Consciousness: awake, alert  and patient cooperative  Airway and Oxygen Therapy: Patient Spontanous Breathing and Patient connected to supplemental oxygen  Post-op Assessment: Post-op Vital signs reviewed, Patient's Cardiovascular Status Stable, Respiratory Function Stable, Patent Airway and No signs of Nausea or vomiting  Post-op Vital Signs: Reviewed and stable  Complications: No notable events documented.

## 2023-08-22 NOTE — H&P (Signed)
 HISTORY AND PHYSICAL INTERVAL NOTE:  08/22/2023  7:09 AM  [name]  has presented today for surgery, with the diagnosis of Acquired hammer toes of both feet M20.41, M20.42 Bilateral foot pain M79.671, M79.672.  The various methods of treatment have been discussed with the patient.  No guarantees were given.  After consideration of risks, benefits and other options for treatment, the patient has consented to surgery.  I have reviewed the patients' chart and labs.    PROCEDURE: RIGHT 4TH DIPJ DEROTATIONAL ARTHROPLASTY LEFT 5TH PIPJ DEROTATIONAL ARTHROPLASTY   A history and physical examination was performed in my office.  The patient was reexamined.  There have been no changes to this history and physical examination.  Rosetta Posner, DPM

## 2023-08-22 NOTE — Op Note (Signed)
 PODIATRY / FOOT AND ANKLE SURGERY OPERATIVE REPORT    SURGEON: Rosetta Posner, DPM  PRE-OPERATIVE DIAGNOSIS:  1.  Right fourth hammertoe 2.  Left fifth hammertoe  POST-OPERATIVE DIAGNOSIS: Same  PROCEDURE(S): Right fourth toe DIPJ derotational arthroplasty Left fifth toe PIPJ derotational arthroplasty  HEMOSTASIS: Ankle tourniquet bilateral, right side 11 minutes, left side 10 minutes  ANESTHESIA: general  ESTIMATED BLOOD LOSS: 5 cc  FINDING(S): 1.  Hammertoe DIPJ right fourth toe and PIPJ left fifth toe  PATHOLOGY/SPECIMEN(S): None  INDICATIONS:   [name] is a 43 y.o. female who presents with chronic pain due to hammertoes to the right fourth toe DIPJ and left fifth toe PIPJ.  Patient gets prominences that rub against the shoe and cause pain discomfort.  Patient has exhausted conservative measures consisting of changes in shoes, padding, taping, strapping and presents today for surgical intervention.  Patient does have a history of smoking and it was just discussed before surgery and after surgery to discontinue smoking as this could impact healing because slow healing, delayed healing and potentially further complications.  All treatment options were discussed with the patient both conservative and surgical attempts at correction include potential risks and complications at this time patient is elected for surgical intervention today consisting of right fourth toe DIPJ derotational arthroplasty and left fifth toe PIPJ derotational arthroplasty.  No guarantees given.  Consent obtained prior to procedure..  DESCRIPTION: After obtaining full informed written consent, the patient was brought back to the operating room and placed supine upon the operating table.  The patient received IV antibiotics prior to induction.  After obtaining adequate anesthesia, the patient was prepped and draped in the standard fashion.  5 cc of half percent Marcaine plain was injected about the right  fourth toe and left fifth toe and digital blocks prior to procedure.  An Esmarch bandage was used to exsanguinate the right lower extremity and the pneumatic ankle tourniquet was inflated.  Attention was then directed to the right fourth toe DIPJ where a derotational elliptical incision was made with the appropriate orientation over the DIPJ.  The skin and some the subcutaneous tissue was ellipsed and passed off the operative site.  A extensor tenotomy and capsulotomy was performed at the DIPJ of the right fourth toe followed by release the collateral ligaments.  The extensor tendon was reflected proximally thereby exposing the head of the proximal phalanx at the operative site.  The head of the proximal phalanx appeared to be tilted such that the lateral section was longer than the medial section.  At this time the sagittal bone saw was used to resect the head of the proximal phalanx to a smooth contour and even on both sides to straighten the toe.  C-arm imaging was utilized to verify resection which appeared to be excellent with the appropriate orientation.  The surgical site was flushed with copious amounts normal sterile saline.  The extensor tendon was reapproximated well coapted with 3-0 Vicryl and the skin was then reapproximated well coapted with 3-0 nylon.  The pneumatic ankle tourniquet was deflated and a prompt hyperemic response was noted all digits of the right foot with capillary fill time intact to the digit after about a minute or 2.  Attention was then directed to the left foot where an Esmarch bandage was used to exsanguinate the left lower extremity and the pneumatic ankle tourniquet was inflated.  A derotational arthroplasty incision that was elliptical in nature was made over the PIPJ with the appropriate  orientation.  The skin and subcutaneous tissue was ellipsed and passed off the operative site.  An extensor tenotomy was performed followed by capsulotomy with the release of the  collateral ligaments at the PIPJ of the left fifth toe.  Sagittal bone saw was then used to resect the head of the proximal phalanx at the neck level.  The bone was then passed off the operative site.  The surgical site was flushed with copious amounts normal sterile saline.  The extensor tendon was reapproximated well coapted with 3-0 Vicryl.  The skin was then reapproximated well coapted with 3-0 nylon.  C-arm imaging was utilized to verify correct position of toe which appeared to be excellent and appeared to be in a rectus position overall.  The pneumatic ankle tourniquet was deflated and prompt hyperemic response was noted all digits left foot.  A postoperative block was performed with 3 to 4 cc of Exparel divided between each toe that was operated on.  Postoperative dressing is applied consisting of Xeroform to the areas followed by 4 x 4 gauze, gauze roll, Ace wrap, postop shoes.  Patient tolerated the procedure and anesthesia well and was transferred to the recovery in vital signs stable vascular status intact all toes of both feet.  Following period of postoperative monitoring the patient be discharged home with the appropriate orders, instructions, and medications.  COMPLICATIONS: None  CONDITION: Good, stable  Rosetta Posner, DPM

## 2023-08-22 NOTE — Anesthesia Postprocedure Evaluation (Signed)
 Anesthesia Post Note  Patient: [name]  Procedure(s) Performed: CORRECTION, HAMMER TOE (Bilateral: Toe)  Patient location during evaluation: PACU Anesthesia Type: General Level of consciousness: awake and alert, oriented and patient cooperative Pain management: pain level controlled Vital Signs Assessment: post-procedure vital signs reviewed and stable Respiratory status: spontaneous breathing, nonlabored ventilation and respiratory function stable Cardiovascular status: blood pressure returned to baseline and stable Postop Assessment: adequate PO intake Anesthetic complications: no   No notable events documented.   Last Vitals:  Vitals:   08/22/23 0845 08/22/23 0854  BP: (!) 131/95   Pulse: 68 67  Resp: 19 16  Temp:    SpO2: 98% 98%    Last Pain:  Vitals:   08/22/23 0830  TempSrc:   PainSc: 0-No pain                 Reed Breech

## 2023-08-22 NOTE — Anesthesia Procedure Notes (Signed)
 Procedure Name: LMA Insertion Date/Time: 08/22/2023 7:39 AM  Performed by: Barbette Hair, CRNAPre-anesthesia Checklist: Patient identified, Emergency Drugs available, Suction available, Patient being monitored and Timeout performed Patient Re-evaluated:Patient Re-evaluated prior to induction Oxygen Delivery Method: Circle system utilized Preoxygenation: Pre-oxygenation with 100% oxygen Induction Type: IV induction LMA: LMA inserted LMA Size: 4.0 Tube size: 4.0 mm Number of attempts: 1 Placement Confirmation: positive ETCO2, CO2 detector and breath sounds checked- equal and bilateral Tube secured with: Tape Dental Injury: Teeth and Oropharynx as per pre-operative assessment

## 2023-08-23 ENCOUNTER — Encounter: Payer: Self-pay | Admitting: Podiatry

## 2023-10-22 ENCOUNTER — Encounter (INDEPENDENT_AMBULATORY_CARE_PROVIDER_SITE_OTHER): Payer: Self-pay

## 2023-11-08 ENCOUNTER — Other Ambulatory Visit: Payer: Self-pay | Admitting: Family Medicine

## 2023-11-08 DIAGNOSIS — R1011 Right upper quadrant pain: Secondary | ICD-10-CM

## 2023-11-12 ENCOUNTER — Ambulatory Visit
Admission: RE | Admit: 2023-11-12 | Discharge: 2023-11-12 | Disposition: A | Discharge status: Home / Self Care | Source: Ambulatory Visit | Attending: Family Medicine | Admitting: Family Medicine

## 2023-11-12 DIAGNOSIS — R1011 Right upper quadrant pain: Secondary | ICD-10-CM | POA: Insufficient documentation

## 2024-01-28 ENCOUNTER — Ambulatory Visit: Admitting: Certified Registered"

## 2024-01-28 ENCOUNTER — Ambulatory Visit
Admission: RE | Admit: 2024-01-28 | Discharge: 2024-01-28 | Disposition: A | Discharge status: Home / Self Care | Attending: Gastroenterology | Admitting: Gastroenterology

## 2024-01-28 ENCOUNTER — Encounter
Admission: RE | Disposition: A | Discharge status: Home / Self Care | Payer: Self-pay | Source: Home / Self Care | Attending: Gastroenterology

## 2024-01-28 ENCOUNTER — Encounter: Payer: Self-pay | Admitting: Gastroenterology

## 2024-01-28 DIAGNOSIS — F17210 Nicotine dependence, cigarettes, uncomplicated: Secondary | ICD-10-CM | POA: Insufficient documentation

## 2024-01-28 DIAGNOSIS — F17290 Nicotine dependence, other tobacco product, uncomplicated: Secondary | ICD-10-CM | POA: Insufficient documentation

## 2024-01-28 DIAGNOSIS — F419 Anxiety disorder, unspecified: Secondary | ICD-10-CM | POA: Insufficient documentation

## 2024-01-28 DIAGNOSIS — Z980 Intestinal bypass and anastomosis status: Secondary | ICD-10-CM | POA: Insufficient documentation

## 2024-01-28 DIAGNOSIS — Z9884 Bariatric surgery status: Secondary | ICD-10-CM | POA: Diagnosis not present

## 2024-01-28 DIAGNOSIS — F32A Depression, unspecified: Secondary | ICD-10-CM | POA: Diagnosis not present

## 2024-01-28 DIAGNOSIS — K219 Gastro-esophageal reflux disease without esophagitis: Secondary | ICD-10-CM | POA: Diagnosis present

## 2024-01-28 DIAGNOSIS — K224 Dyskinesia of esophagus: Secondary | ICD-10-CM | POA: Insufficient documentation

## 2024-01-28 SURGERY — EGD (ESOPHAGOGASTRODUODENOSCOPY)
Anesthesia: General

## 2024-01-28 MED ORDER — PROPOFOL 500 MG/50ML IV EMUL
INTRAVENOUS | Status: DC | PRN
  Administered 2024-01-28: 165 ug/kg/min via INTRAVENOUS

## 2024-01-28 MED ORDER — PROPOFOL 10 MG/ML IV BOLUS
INTRAVENOUS | Status: DC | PRN
  Administered 2024-01-28: 60 mg via INTRAVENOUS

## 2024-01-28 MED ORDER — SODIUM CHLORIDE 0.9 % IV SOLN: INTRAVENOUS | Status: DC

## 2024-01-28 MED ORDER — GLYCOPYRROLATE 0.2 MG/ML IJ SOLN
INTRAMUSCULAR | Status: DC | PRN
  Administered 2024-01-28: .2 mg via INTRAVENOUS

## 2024-01-28 MED ORDER — LIDOCAINE HCL (CARDIAC) PF 100 MG/5ML IV SOSY
PREFILLED_SYRINGE | INTRAVENOUS | Status: DC | PRN
  Administered 2024-01-28: 100 mg via INTRAVENOUS

## 2024-01-28 MED ORDER — MIDAZOLAM HCL 2 MG/2ML IJ SOLN
INTRAMUSCULAR | Status: AC
  Filled 2024-01-28: qty 2

## 2024-01-28 MED ORDER — MIDAZOLAM HCL 2 MG/2ML IJ SOLN
INTRAMUSCULAR | Status: DC | PRN
  Administered 2024-01-28: 2 mg via INTRAVENOUS

## 2024-01-28 MED ORDER — DEXMEDETOMIDINE HCL IN NACL 200 MCG/50ML IV SOLN
INTRAVENOUS | Status: DC | PRN
  Administered 2024-01-28: 12 ug via INTRAVENOUS

## 2024-01-28 NOTE — Op Note (Signed)
 [facility] Patient Name: [name] Procedure Date: 01/28/2024 9:54 AM MRN: [id number] Account #: [id number] Date of Birth: 1981/04/15 Admit Type: Outpatient Age: 43 Room: [facility] ENDO ROOM 2 Gender: Female Note Status: Finalized Instrument Name: Barnie GI Scope [id number] Procedure:             Upper GI endoscopy Indications:           Follow-up of esophageal reflux Providers:             Ruel Kung MD, MD Referring MD:          Ruel Kung MD, MD (Referring MD), Lynwood FALCON. Valora, MD                         (Referring MD) Medicines:             Monitored Anesthesia Care Complications:         No immediate complications. Procedure:             Pre-Anesthesia Assessment:                        - Prior to the procedure, a History and Physical was                         performed, and patient medications, allergies and                         sensitivities were reviewed. The patient's tolerance                         of previous anesthesia was reviewed.                        - The risks and benefits of the procedure and the                         sedation options and risks were discussed with the                         patient. All questions were answered and informed                         consent was obtained.                        - ASA Grade Assessment: II - A patient with mild                         systemic disease.                        After obtaining informed consent, the endoscope was                         passed under direct vision. Throughout the procedure,                         the patient's blood pressure, pulse, and oxygen   saturations were monitored continuously. The Endoscope                         was introduced through the mouth, and advanced to the                         jejunum. The upper GI endoscopy was accomplished with                         ease. The patient tolerated the procedure  well. Findings:      Abnormal motility was noted in the esophagus. The cricopharyngeus was       abnormal. There is a decrease in motility of the esophageal body. The       distal esophagus/lower esophageal sphincter is open. Normal peristalsis       not noted.      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 Impression:            - Abnormal esophageal motility, suspicious for                         aperistalsis.                        - Evidence of previous gastric surgery was found,                         characterized by healthy appearing mucosa.                        - Normal examined jejunum.                        - No specimens collected. Recommendation:        - Discharge patient to home (with escort).                        - Resume previous diet.                        - Continue present medications.                        - Return to GI office in 6 weeks.                        - Suspect esophageal dysmotility - recommend                         esophageal manometry Procedure Code(s):     --- Professional ---                        [phone], Esophagogastroduodenoscopy, flexible,                         transoral; diagnostic, including collection of                         specimen(s)  by brushing or washing, when performed                         (separate procedure) Diagnosis Code(s):     --- Professional ---                        Z98.0, Intestinal bypass and anastomosis status                        K22.4, Dyskinesia of esophagus                        K21.9, Gastro-esophageal reflux disease without                         esophagitis CPT copyright 2022 [employer]. All rights reserved. The codes documented in this report are preliminary and upon coder review may  be revised to meet current compliance requirements. Ruel Kung,  MD Ruel Kung MD, MD 01/28/2024 10:03:50 AM This report has been signed electronically. Number of Addenda: 0 Note Initiated On: 01/28/2024 9:54 AM Estimated Blood Loss:  Estimated blood loss: none.      [facility]

## 2024-01-28 NOTE — Anesthesia Preprocedure Evaluation (Addendum)
 Anesthesia Evaluation  Patient identified by MRN, date of birth, ID band Patient awake    Reviewed: Allergy  & Precautions, NPO status , Patient's Chart, lab work & pertinent test results  History of Anesthesia Complications (+) PONV and history of anesthetic complications  Airway Mallampati: II   Neck ROM: Full    Dental no notable dental hx.    Pulmonary Current Smoker and Patient abstained from smoking.   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     Musculoskeletal  (+) Arthritis ,    Abdominal Normal abdominal exam  (+)   Peds  Hematology  (+) Blood dyscrasia, anemia   Anesthesia Other Findings   Reproductive/Obstetrics                              Anesthesia Physical Anesthesia Plan  ASA: 2  Anesthesia Plan: General   Post-op Pain Management: Minimal or no pain anticipated   Induction: Intravenous  PONV Risk Score and Plan: 3 and TIVA and Propofol  infusion  Airway Management Planned: Natural Airway  Additional Equipment:   Intra-op Plan:   Post-operative Plan:   Informed Consent: I have reviewed the patients History and Physical, chart, labs and discussed the procedure including the risks, benefits and alternatives for the proposed anesthesia with the patient or authorized representative who has indicated his/her understanding and acceptance.     Dental advisory given  Plan Discussed with: CRNA  Anesthesia Plan Comments:         Anesthesia Quick Evaluation

## 2024-01-28 NOTE — Anesthesia Procedure Notes (Signed)
 Procedure Name: General with mask airway Date/Time: 01/28/2024 9:56 AM  Performed by: Ledora Duncan, CRNAPre-anesthesia Checklist: Patient identified, Emergency Drugs available, Suction available and Patient being monitored Patient Re-evaluated:Patient Re-evaluated prior to induction Oxygen  Delivery Method: Simple face mask Induction Type: IV induction Placement Confirmation: positive ETCO2 and breath sounds checked- equal and bilateral Dental Injury: Teeth and Oropharynx as per pre-operative assessment

## 2024-01-28 NOTE — Anesthesia Postprocedure Evaluation (Signed)
 Anesthesia Post Note  Patient: [name]  Procedure(s) Performed: EGD (ESOPHAGOGASTRODUODENOSCOPY)  Patient location during evaluation: Endoscopy Anesthesia Type: General Level of consciousness: awake and alert Pain management: pain level controlled Vital Signs Assessment: post-procedure vital signs reviewed and stable Respiratory status: spontaneous breathing, nonlabored ventilation and respiratory function stable Cardiovascular status: blood pressure returned to baseline and stable Postop Assessment: no apparent nausea or vomiting Anesthetic complications: no   No notable events documented.   Last Vitals:  Vitals:   01/28/24 1012 01/28/24 1022  BP: 109/77 116/85  Pulse: 69 69  Resp: 19 15  Temp:    SpO2: 95% 96%    Last Pain:  Vitals:   01/28/24 1022  TempSrc:   PainSc: 0-No pain                 [name]
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
## 2024-01-28 NOTE — Transfer of Care (Signed)
 Immediate Anesthesia Transfer of Care Note  Patient: [name]  Procedure(s) Performed: EGD (ESOPHAGOGASTRODUODENOSCOPY)  Patient Location: Endoscopy Unit  Anesthesia Type:General  Level of Consciousness: drowsy and patient cooperative  Airway & Oxygen  Therapy: Patient Spontanous Breathing and Patient connected to face mask oxygen   Post-op Assessment: Report given to RN and Post -op Vital signs reviewed and stable  Post vital signs: Reviewed and stable  Last Vitals:  Vitals Value Taken Time  BP 106/62 01/28/24 10:02  Temp 36.2 C 01/28/24 10:02  Pulse 69 01/28/24 10:12  Resp 19 01/28/24 10:12  SpO2 95 % 01/28/24 10:12  Vitals shown include unfiled device data.  Last Pain:  Vitals:   01/28/24 1002  TempSrc: Temporal  PainSc: Asleep         Complications: No notable events documented.

## 2024-03-24 ENCOUNTER — Encounter (HOSPITAL_COMMUNITY): Payer: Self-pay

## 2024-03-24 ENCOUNTER — Emergency Department (HOSPITAL_COMMUNITY)
Admission: EM | Admit: 2024-03-24 | Discharge: 2024-03-24 | Disposition: A | Discharge status: Home / Self Care | Attending: Emergency Medicine | Admitting: Emergency Medicine

## 2024-03-24 ENCOUNTER — Emergency Department (HOSPITAL_COMMUNITY)

## 2024-03-24 ENCOUNTER — Other Ambulatory Visit: Payer: Self-pay

## 2024-03-24 DIAGNOSIS — R112 Nausea with vomiting, unspecified: Secondary | ICD-10-CM | POA: Insufficient documentation

## 2024-03-24 DIAGNOSIS — R10A1 Flank pain, right side: Secondary | ICD-10-CM | POA: Diagnosis present

## 2024-03-24 DIAGNOSIS — Z91040 Latex allergy status: Secondary | ICD-10-CM | POA: Insufficient documentation

## 2024-03-24 HISTORY — DX: Disorder of kidney and ureter, unspecified: N28.9

## 2024-03-24 LAB — COMPREHENSIVE METABOLIC PANEL WITH GFR
ALT: 17 U/L (ref 0–44)
AST: 24 U/L (ref 15–41)
Albumin: 4.4 g/dL (ref 3.5–5.0)
Alkaline Phosphatase: 91 U/L (ref 38–126)
Anion gap: 11 (ref 5–15)
BUN: 11 mg/dL (ref 6–20)
CO2: 24 mmol/L (ref 22–32)
Calcium: 8.8 mg/dL — ABNORMAL LOW (ref 8.9–10.3)
Chloride: 101 mmol/L (ref 98–111)
Creatinine, Ser: 0.66 mg/dL (ref 0.44–1.00)
GFR, Estimated: >60 mL/min (ref >60)
Glucose, Bld: 88 mg/dL (ref 70–99)
Potassium: 3.9 mmol/L (ref 3.5–5.1)
Sodium: 137 mmol/L (ref 135–145)
Total Bilirubin: 0.4 mg/dL (ref 0.0–1.2)
Total Protein: 6.8 g/dL (ref 6.5–8.1)

## 2024-03-24 LAB — CBC WITH DIFFERENTIAL/PLATELET
Abs Immature Granulocytes: 0.05 K/uL (ref 0.00–0.07)
Basophils Absolute: 0.1 K/uL (ref 0.0–0.1)
Basophils Relative: 1 %
Eosinophils Absolute: 0 K/uL (ref 0.0–0.5)
Eosinophils Relative: 0 %
HCT: 46.8 % — ABNORMAL HIGH (ref 36.0–46.0)
Hemoglobin: 15.4 g/dL — ABNORMAL HIGH (ref 12.0–15.0)
Immature Granulocytes: 1 %
Lymphocytes Relative: 21 %
Lymphs Abs: 2.3 K/uL (ref 0.7–4.0)
MCH: 30.7 pg (ref 26.0–34.0)
MCHC: 32.9 g/dL (ref 30.0–36.0)
MCV: 93.4 fL (ref 80.0–100.0)
Monocytes Absolute: 0.6 K/uL (ref 0.1–1.0)
Monocytes Relative: 6 %
Neutro Abs: 7.8 K/uL — ABNORMAL HIGH (ref 1.7–7.7)
Neutrophils Relative %: 71 %
Platelets: 214 K/uL (ref 150–400)
RBC: 5.01 MIL/uL (ref 3.87–5.11)
RDW: 13.2 % (ref 11.5–15.5)
WBC: 10.9 K/uL — ABNORMAL HIGH (ref 4.0–10.5)
nRBC: 0 % (ref 0.0–0.2)

## 2024-03-24 LAB — URINALYSIS, ROUTINE W REFLEX MICROSCOPIC
Bilirubin Urine: NEGATIVE
Glucose, UA: NEGATIVE mg/dL
Hgb urine dipstick: NEGATIVE
Ketones, ur: NEGATIVE mg/dL
Leukocytes,Ua: NEGATIVE
Nitrite: NEGATIVE
Protein, ur: NEGATIVE mg/dL
Specific Gravity, Urine: 1.011 (ref 1.005–1.030)
pH: 6 (ref 5.0–8.0)

## 2024-03-24 LAB — PREGNANCY, URINE: Preg Test, Ur: NEGATIVE

## 2024-03-24 LAB — LIPASE, BLOOD: Lipase: 16 U/L (ref 11–51)

## 2024-03-24 MED ORDER — KETOROLAC TROMETHAMINE 15 MG/ML IJ SOLN
15.0000 mg | Freq: Once | INTRAMUSCULAR | Status: AC
  Administered 2024-03-24: 15 mg via INTRAVENOUS
  Filled 2024-03-24: qty 1

## 2024-03-24 MED ORDER — SODIUM CHLORIDE 0.9 % IV BOLUS
1000.0000 mL | Freq: Once | INTRAVENOUS | Status: AC
  Administered 2024-03-24: 1000 mL via INTRAVENOUS

## 2024-03-24 MED ORDER — MORPHINE SULFATE (PF) 4 MG/ML IV SOLN
4.0000 mg | Freq: Once | INTRAVENOUS | Status: AC
  Administered 2024-03-24: 4 mg via INTRAVENOUS
  Filled 2024-03-24: qty 1

## 2024-03-24 MED ORDER — MORPHINE SULFATE (PF) 2 MG/ML IV SOLN
2.0000 mg | Freq: Once | INTRAVENOUS | Status: AC
  Administered 2024-03-24: 2 mg via INTRAVENOUS
  Filled 2024-03-24: qty 1

## 2024-03-24 MED ORDER — ONDANSETRON HCL 4 MG/2ML IJ SOLN
4.0000 mg | Freq: Once | INTRAMUSCULAR | Status: AC
  Administered 2024-03-24: 4 mg via INTRAVENOUS
  Filled 2024-03-24: qty 2

## 2024-03-24 NOTE — ED Triage Notes (Signed)
 Pt arrived via POV c/o severe right flank pain that began yesterday. Pt reports difficulty urinating now and reports N/V. Pt reports Hx of kidney stones.

## 2024-03-24 NOTE — ED Provider Notes (Signed)
 [place] EMERGENCY DEPARTMENT AT [facility] Provider Note   CSN: [id number] Arrival date & time: 03/24/24  9082     Patient presents with: Flank Pain  HPI [name] is a 43 y.o. female presenting for right flank pain that started yesterday afternoon around 4 PM.  Also endorsing nausea and vomiting along with reduced urinary output.  Does report a history of kidney stones and states the pain is similar.  Status post cholecystectomy and gastric bypass.  Also reports chills but unsure if she had a fever.    Flank Pain       Prior to Admission medications   Medication Sig Start Date End Date Taking? Authorizing Provider  acetaminophen  (TYLENOL ) 500 MG tablet Take 500 mg by mouth every 6 (six) hours as needed.   Yes [provider]  amitriptyline  (ELAVIL ) 10 MG tablet Take by mouth at bedtime.   Yes [provider]  atomoxetine (STRATTERA) 18 MG capsule Take 18 mg by mouth daily. 02/26/24 08/24/24 Yes [provider]  buPROPion (WELLBUTRIN XL) 150 MG 24 hr tablet Take 150 mg by mouth daily. 02/26/24  Yes [provider]  clonazePAM  (KLONOPIN ) 0.5 MG tablet Take 0.5 mg by mouth 2 (two) times daily. 02/26/24 05/26/24 Yes [provider]  Cyanocobalamin  (B-12 COMPLIANCE INJECTION) 1000 MCG/ML KIT Inject as directed once a week.   Yes [provider]  EPINEPHRINE  0.3 mg/0.3 mL IJ SOAJ injection INJECT 0.3 MLS INTO MUSCLE ONCE AS NEEDED FOR UP TO ONE DOSE 02/13/18  Yes Bobbitt, Elgin Pepper, MD  famotidine  (PEPCID ) 40 MG tablet Take 40 mg by mouth at bedtime. 03/03/24  Yes [provider]  Galcanezumab-gnlm (EMGALITY) 120 MG/ML SOAJ Inject into the skin. Inject 120 g subQ every 28 days 07/28/21  Yes [provider]  levonorgestrel  (MIRENA ) 20 MCG/DAY IUD 1 each by Intrauterine route once.   Yes [provider]  Multiple Vitamin (MULTIVITAMIN WITH MINERALS) TABS tablet Take 1 tablet by mouth daily.    Yes [provider]  pantoprazole  (PROTONIX ) 40 MG tablet Take 40 mg by mouth 2 (two) times daily. 03/03/24  Yes [provider]  prochlorperazine  (COMPAZINE ) 5 MG tablet TAKE (1) TABLET BY MOUTH EVERY 6 HOURS AS NEEDED NAUSEA OR VOMITING. DO NOT TAKE WITH PHENERGAN  03/09/20  Yes Waddell, Malena M, DO  promethazine  (PHENERGAN ) 12.5 MG tablet Take 1 tablet (12.5 mg total) by mouth every 6 (six) hours as needed for nausea or vomiting. 08/22/23  Yes Lennie Barter, DPM  propranolol (INDERAL) 20 MG tablet Take 1 tablet by mouth 2 (two) times daily. 07/28/21  Yes [provider]  QUEtiapine  (SEROQUEL  XR) 200 MG 24 hr tablet Take 200 mg by mouth daily. 02/26/24  Yes [provider]  QUEtiapine  (SEROQUEL ) 50 MG tablet Take 100 mg by mouth at bedtime as needed. 12/23/23  Yes [provider]  RESTASIS  0.05 % ophthalmic emulsion 1 drop 2 (two) times daily. 09/03/22  Yes [provider]  sertraline  (ZOLOFT ) 100 MG tablet Take 200 mg by mouth daily. 04/25/20 03/24/24 Yes [provider]  sucralfate  (CARAFATE ) 1 GM/10ML suspension Take 1 g by mouth 4 (four) times daily. 12/03/23  Yes [provider]  UBRELVY  100 MG TABS Take 1 tablet by mouth as needed. May take an additional table max 2 tablets in 24 hours   Yes [provider]  predniSONE  (DELTASONE ) 10 MG tablet Take 10 mg by mouth daily. Patient not taking: Reported on 03/24/2024 03/13/24  [provider]  Syringe, Disposable, (BD TUBERCULIN SYRINGE) 1 ML MISC Use as directed for B12 injection. 05/06/20   Mauro Elveria BROCKS, NP  SYRINGE-NEEDLE, DISP, 3 ML (BD SAFETYGLIDE SYRINGE/NEEDLE) 25G X 1 3 ML MISC Use the needle for b12 injection as directed 08/17/21   Dasie Tinnie MATSU, NP    Allergies: Bacitracin, Bee venom, Blue dyes (parenteral), Chlorhexidine, Latex, Nitrofurantoin, Cephalexin, Codeine, Adhesive [tape], Skin comfort [alum sulfate-ca acetate], Bacitracin-polymyxin b,  Nitrofurantoin macrocrystal, and Tilactase    Review of Systems  Genitourinary:  Positive for flank pain.    Updated Vital Signs BP 123/78   Pulse 65   Temp 98.3 F (36.8 C) (Oral)   Resp 15   Ht 5' 4 (1.626 m)   Wt 70.8 kg   SpO2 98%   BMI 26.79 kg/m   Physical Exam Vitals and nursing note reviewed.  HENT:     Head: Normocephalic and atraumatic.     Mouth/Throat:     Mouth: Mucous membranes are moist.  Eyes:     General:        Right eye: No discharge.        Left eye: No discharge.     Conjunctiva/sclera: Conjunctivae normal.  Cardiovascular:     Rate and Rhythm: Normal rate and regular rhythm.     Pulses: Normal pulses.     Heart sounds: Normal heart sounds.  Pulmonary:     Effort: Pulmonary effort is normal.     Breath sounds: Normal breath sounds.  Abdominal:     General: Abdomen is flat.     Palpations: Abdomen is soft.     Tenderness: There is right CVA tenderness.  Skin:    General: Skin is warm and dry.  Neurological:     General: No focal deficit present.  Psychiatric:        Mood and Affect: Mood normal.     (all labs ordered are listed, but only abnormal results are displayed) Labs Reviewed  CBC WITH DIFFERENTIAL/PLATELET - Abnormal; Notable for the following components:      Result Value   WBC 10.9 (*)    Hemoglobin 15.4 (*)    HCT 46.8 (*)    Neutro Abs 7.8 (*)    All other components within normal limits  COMPREHENSIVE METABOLIC PANEL WITH GFR - Abnormal; Notable for the following components:   Calcium  8.8 (*)    All other components within normal limits  URINALYSIS, ROUTINE W REFLEX MICROSCOPIC  LIPASE, BLOOD  PREGNANCY, URINE    EKG: None  Radiology: CT Renal Stone Study Result Date: 03/24/2024 CLINICAL DATA:  Right flank pain EXAM: CT ABDOMEN AND PELVIS WITHOUT CONTRAST TECHNIQUE: Multidetector CT imaging of the abdomen and pelvis was performed following the standard protocol without IV contrast. RADIATION DOSE REDUCTION: This  exam was performed according to the departmental dose-optimization program which includes automated exposure control, adjustment of the mA and/or kV according to patient size and/or use of iterative reconstruction technique. COMPARISON:  Ultrasound abdomen November 12, 2023. FINDINGS: Lower chest: No acute abnormality. Hepatobiliary: No focal liver abnormality is seen. Cholecystectomy. No intrahepatic biliary dilatation. Pancreas: Unremarkable. No pancreatic ductal dilatation or surrounding inflammatory changes. Spleen: Normal in size without focal abnormality. Adrenals/Urinary Tract: Adrenal glands are unremarkable. Kidneys are normal, without renal calculi, focal lesion, or hydronephrosis. Punctate 3 mm calcific density in right lower quadrant along the course of the right ureter adjacent and lateral to the right ovary (6/94) may represent a distal ureteral stone. Evaluation  is suboptimal given the lack of contrast and overlying bowel loops. No hydronephrosis or hydroureter identified. Previously seen left kidney upper pole punctate nephrolithiasis is not visualized on current exam. Bladder is unremarkable. No intraluminal stone or abnormal wall thickening. Stomach/Bowel: Status post gastric bypass surgery. Moderate size hiatal hernia. Appendix appears normal. No evidence of bowel wall thickening, distention, or inflammatory changes. Vascular/Lymphatic: No significant vascular findings are present. No enlarged abdominal or pelvic lymph nodes. Reproductive: Uterus and bilateral adnexa are unremarkable. IUD is identified in proper position in endometrial cavity. Other: Tiny fat containing umbilical hernia with a defect measuring 5 mm. No abdominopelvic ascites. Musculoskeletal: No acute or significant osseous findings. IMPRESSION: Punctate calcified density in right pelvic cavity along the course of the right ureter may represent a nonobstructive ureteral stone versus an extra ureteral calcified phlebolith. Evaluation  is challenging given the lack of contrast and opacification of the ureter and superimposing bowel loops. No hydronephrosis. Recommend CT urogram for further assessment if clinically warranted. Postsurgical changes of gastric bypass without bowel obstruction. Cholecystectomy without biliary dilatation. Electronically Signed   By: Megan  Zare M.D.   On: 03/24/2024 14:58     Procedures   Medications Ordered in the ED  ketorolac  (TORADOL ) 15 MG/ML injection 15 mg (has no administration in time range)  ondansetron  (ZOFRAN ) injection 4 mg (4 mg Intravenous Given 03/24/24 1027)  sodium chloride  0.9 % bolus 1,000 mL (1,000 mLs Intravenous Bolus 03/24/24 1028)  morphine  (PF) 4 MG/ML injection 4 mg (4 mg Intravenous Given 03/24/24 1027)  morphine  (PF) 2 MG/ML injection 2 mg (2 mg Intravenous Given 03/24/24 1257)                                    Medical Decision Making Amount and/or Complexity of Data Reviewed Labs: ordered. Radiology: ordered.  Risk Prescription drug management.   42 year old well-appearing female presenting for flank pain.  Exam notable for right CVA tenderness.  DDx includes kidney stone, pyelonephritis, PE, biliary colic, UTI, sepsis, other. CT renal showed punctate calcified density in right pelvic cavity along the course of the right ureter may represent a nonobstructive ureteral stone versus an extra ureteral calcified phlebolith. Evaluation is challenging given the lack of contrast and opacification of the ureter and superimposing bowel loops. No hydronephrosis.  Urinalysis was normal.  Slight elevation of white blood cell count of 10.9.  Workup does not suggest sepsis or active infection.  Last vitals were normal.  Pain improved after treatment but still present.  Gave Toradol .  Advised urology follow-up given recurrence of her symptoms and persistent pain.  Renal function appears to be normal.  Discussed return precautions.  Fluid challenge with no issue.  Also advised PCP  follow-up.  Discharged.     Final diagnoses:  Right flank pain    ED Discharge Orders     None          Jaquay Morneault K, PA-C 03/24/24 1520    Suzette Pac, MD 03/24/24 1553

## 2024-03-24 NOTE — Discharge Instructions (Signed)
 Evaluation of your flank pain today was overall reassuring.  Please follow-up with urology for the CT findings.  You can take Tylenol  at home.  If you develop a fever, painful urination, abdominal pain or worsening flank pain or any other concerning symptom please return to the ED for further evaluation.  Also recommend PCP follow-up as well.

## 2024-03-24 NOTE — ED Notes (Signed)
 Patient transported to CT
